# Patient Record
Sex: Female | Born: 1960
Health system: Southern US, Community
[De-identification: ages and names within clinical notes are randomized; demographics above are authoritative.]

## PROBLEM LIST (undated history)

## (undated) DIAGNOSIS — R600 Localized edema: Secondary | ICD-10-CM

## (undated) DIAGNOSIS — G5600 Carpal tunnel syndrome, unspecified upper limb: Secondary | ICD-10-CM

## (undated) DIAGNOSIS — M25569 Pain in unspecified knee: Secondary | ICD-10-CM

## (undated) DIAGNOSIS — E739 Lactose intolerance, unspecified: Secondary | ICD-10-CM

## (undated) DIAGNOSIS — E119 Type 2 diabetes mellitus without complications: Secondary | ICD-10-CM

## (undated) DIAGNOSIS — M1990 Unspecified osteoarthritis, unspecified site: Secondary | ICD-10-CM

## (undated) DIAGNOSIS — E785 Hyperlipidemia, unspecified: Secondary | ICD-10-CM

## (undated) DIAGNOSIS — E559 Vitamin D deficiency, unspecified: Secondary | ICD-10-CM

## (undated) DIAGNOSIS — D251 Intramural leiomyoma of uterus: Secondary | ICD-10-CM

## (undated) DIAGNOSIS — R519 Headache, unspecified: Secondary | ICD-10-CM

## (undated) DIAGNOSIS — M549 Dorsalgia, unspecified: Secondary | ICD-10-CM

## (undated) DIAGNOSIS — M2550 Pain in unspecified joint: Secondary | ICD-10-CM

## (undated) DIAGNOSIS — D649 Anemia, unspecified: Secondary | ICD-10-CM

## (undated) DIAGNOSIS — Z6841 Body Mass Index (BMI) 40.0 and over, adult: Secondary | ICD-10-CM

## (undated) DIAGNOSIS — I10 Essential (primary) hypertension: Secondary | ICD-10-CM

## (undated) DIAGNOSIS — R51 Headache: Secondary | ICD-10-CM

## (undated) HISTORY — DX: Carpal tunnel syndrome, unspecified upper limb: G56.00

## (undated) HISTORY — DX: Localized edema: R60.0

## (undated) HISTORY — DX: Type 2 diabetes mellitus without complications: E11.9

## (undated) HISTORY — DX: Morbid (severe) obesity due to excess calories: E66.01

## (undated) HISTORY — DX: Anemia, unspecified: D64.9

## (undated) HISTORY — DX: Unspecified osteoarthritis, unspecified site: M19.90

## (undated) HISTORY — DX: Essential (primary) hypertension: I10

## (undated) HISTORY — DX: Dorsalgia, unspecified: M54.9

## (undated) HISTORY — DX: Intramural leiomyoma of uterus: D25.1

## (undated) HISTORY — DX: Pain in unspecified joint: M25.50

## (undated) HISTORY — DX: Hyperlipidemia, unspecified: E78.5

## (undated) HISTORY — DX: Body Mass Index (BMI) 40.0 and over, adult: Z684

## (undated) HISTORY — DX: Vitamin D deficiency, unspecified: E55.9

## (undated) HISTORY — DX: Lactose intolerance, unspecified: E73.9

## (undated) HISTORY — DX: Pain in unspecified knee: M25.569

---

## 1997-06-28 HISTORY — PX: KNEE SURGERY: SHX244

## 1998-07-04 ENCOUNTER — Other Ambulatory Visit: Admission: RE | Admit: 1998-07-04 | Discharge: 1998-07-04 | Payer: Self-pay | Admitting: Obstetrics and Gynecology

## 1999-07-22 ENCOUNTER — Ambulatory Visit (HOSPITAL_COMMUNITY): Admission: RE | Admit: 1999-07-22 | Discharge: 1999-07-22 | Payer: Self-pay | Admitting: Endocrinology

## 1999-07-22 ENCOUNTER — Encounter: Payer: Self-pay | Admitting: Endocrinology

## 1999-08-01 ENCOUNTER — Encounter: Admission: RE | Admit: 1999-08-01 | Discharge: 1999-08-01 | Payer: Self-pay | Admitting: Pediatrics

## 1999-08-01 ENCOUNTER — Encounter: Payer: Self-pay | Admitting: Pediatrics

## 2000-06-20 ENCOUNTER — Emergency Department (HOSPITAL_COMMUNITY): Admission: EM | Admit: 2000-06-20 | Discharge: 2000-06-20 | Payer: Self-pay | Admitting: Internal Medicine

## 2000-08-23 ENCOUNTER — Ambulatory Visit (HOSPITAL_COMMUNITY): Admission: RE | Admit: 2000-08-23 | Discharge: 2000-08-23 | Payer: Self-pay

## 2000-10-12 ENCOUNTER — Ambulatory Visit (HOSPITAL_COMMUNITY): Admission: RE | Admit: 2000-10-12 | Discharge: 2000-10-12 | Payer: Self-pay | Admitting: Neurology

## 2000-10-12 ENCOUNTER — Encounter: Payer: Self-pay | Admitting: Neurology

## 2000-10-24 ENCOUNTER — Encounter: Payer: Self-pay | Admitting: Neurology

## 2000-10-24 ENCOUNTER — Ambulatory Visit (HOSPITAL_COMMUNITY): Admission: RE | Admit: 2000-10-24 | Discharge: 2000-10-24 | Payer: Self-pay | Admitting: Neurology

## 2001-04-04 ENCOUNTER — Encounter: Payer: Self-pay | Admitting: Neurology

## 2001-04-04 ENCOUNTER — Ambulatory Visit (HOSPITAL_COMMUNITY): Admission: RE | Admit: 2001-04-04 | Discharge: 2001-04-04 | Payer: Self-pay | Admitting: Neurology

## 2002-08-21 ENCOUNTER — Emergency Department (HOSPITAL_COMMUNITY): Admission: EM | Admit: 2002-08-21 | Discharge: 2002-08-21 | Payer: Self-pay | Admitting: Emergency Medicine

## 2002-10-17 ENCOUNTER — Ambulatory Visit (HOSPITAL_COMMUNITY): Admission: RE | Admit: 2002-10-17 | Discharge: 2002-10-17 | Payer: Self-pay | Admitting: Internal Medicine

## 2002-10-17 ENCOUNTER — Encounter: Payer: Self-pay | Admitting: Cardiovascular Disease

## 2002-10-24 ENCOUNTER — Other Ambulatory Visit: Admission: RE | Admit: 2002-10-24 | Discharge: 2002-10-24 | Payer: Self-pay | Admitting: Obstetrics and Gynecology

## 2003-05-09 ENCOUNTER — Ambulatory Visit (HOSPITAL_COMMUNITY): Admission: RE | Admit: 2003-05-09 | Discharge: 2003-05-09 | Payer: Self-pay | Admitting: Internal Medicine

## 2003-12-11 ENCOUNTER — Encounter: Admission: RE | Admit: 2003-12-11 | Discharge: 2003-12-11 | Payer: Self-pay

## 2004-01-14 ENCOUNTER — Encounter: Admission: RE | Admit: 2004-01-14 | Discharge: 2004-01-14 | Payer: Self-pay | Admitting: Family Medicine

## 2004-03-13 ENCOUNTER — Ambulatory Visit: Payer: Self-pay | Admitting: Family Medicine

## 2004-04-28 ENCOUNTER — Ambulatory Visit: Payer: Self-pay | Admitting: Family Medicine

## 2004-07-08 ENCOUNTER — Ambulatory Visit (HOSPITAL_COMMUNITY): Admission: RE | Admit: 2004-07-08 | Discharge: 2004-07-08 | Payer: Self-pay | Admitting: Family Medicine

## 2004-08-07 ENCOUNTER — Ambulatory Visit: Payer: Self-pay | Admitting: Sports Medicine

## 2004-09-21 ENCOUNTER — Ambulatory Visit: Payer: Self-pay | Admitting: Family Medicine

## 2004-09-25 ENCOUNTER — Ambulatory Visit: Payer: Self-pay | Admitting: Sports Medicine

## 2004-10-05 ENCOUNTER — Ambulatory Visit: Payer: Self-pay | Admitting: Family Medicine

## 2004-10-19 ENCOUNTER — Ambulatory Visit: Payer: Self-pay | Admitting: Family Medicine

## 2004-10-23 ENCOUNTER — Ambulatory Visit: Payer: Self-pay | Admitting: Family Medicine

## 2004-11-09 ENCOUNTER — Ambulatory Visit: Payer: Self-pay | Admitting: Family Medicine

## 2004-12-07 ENCOUNTER — Ambulatory Visit: Payer: Self-pay | Admitting: Family Medicine

## 2004-12-14 ENCOUNTER — Ambulatory Visit: Payer: Self-pay | Admitting: Family Medicine

## 2005-01-11 ENCOUNTER — Ambulatory Visit: Payer: Self-pay | Admitting: Family Medicine

## 2005-02-15 ENCOUNTER — Ambulatory Visit: Payer: Self-pay | Admitting: Family Medicine

## 2005-03-04 ENCOUNTER — Ambulatory Visit: Payer: Self-pay | Admitting: Family Medicine

## 2005-03-18 ENCOUNTER — Ambulatory Visit: Payer: Self-pay | Admitting: Family Medicine

## 2005-04-02 ENCOUNTER — Ambulatory Visit: Payer: Self-pay | Admitting: Family Medicine

## 2005-04-26 ENCOUNTER — Ambulatory Visit: Payer: Self-pay | Admitting: Family Medicine

## 2005-05-07 ENCOUNTER — Ambulatory Visit: Payer: Self-pay | Admitting: Family Medicine

## 2005-05-12 ENCOUNTER — Ambulatory Visit: Payer: Self-pay | Admitting: Family Medicine

## 2005-05-27 ENCOUNTER — Ambulatory Visit: Payer: Self-pay | Admitting: Family Medicine

## 2005-07-08 ENCOUNTER — Ambulatory Visit: Payer: Self-pay | Admitting: Family Medicine

## 2005-07-09 ENCOUNTER — Encounter: Admission: RE | Admit: 2005-07-09 | Discharge: 2005-07-09 | Payer: Self-pay | Admitting: Sports Medicine

## 2005-07-16 ENCOUNTER — Ambulatory Visit: Payer: Self-pay | Admitting: Family Medicine

## 2005-08-02 ENCOUNTER — Ambulatory Visit: Payer: Self-pay | Admitting: Family Medicine

## 2005-08-30 ENCOUNTER — Ambulatory Visit: Payer: Self-pay | Admitting: Family Medicine

## 2005-09-02 ENCOUNTER — Emergency Department (HOSPITAL_COMMUNITY): Admission: EM | Admit: 2005-09-02 | Discharge: 2005-09-02 | Payer: Self-pay | Admitting: Emergency Medicine

## 2005-09-08 ENCOUNTER — Ambulatory Visit: Payer: Self-pay | Admitting: Family Medicine

## 2005-09-20 ENCOUNTER — Ambulatory Visit: Payer: Self-pay | Admitting: Family Medicine

## 2005-10-28 ENCOUNTER — Ambulatory Visit: Payer: Self-pay | Admitting: Family Medicine

## 2005-12-01 ENCOUNTER — Ambulatory Visit: Payer: Self-pay | Admitting: Family Medicine

## 2006-01-07 ENCOUNTER — Ambulatory Visit: Payer: Self-pay | Admitting: Family Medicine

## 2006-01-10 ENCOUNTER — Ambulatory Visit: Payer: Self-pay | Admitting: Family Medicine

## 2006-01-25 ENCOUNTER — Ambulatory Visit: Payer: Self-pay | Admitting: Family Medicine

## 2006-03-01 ENCOUNTER — Ambulatory Visit: Payer: Self-pay | Admitting: Family Medicine

## 2006-03-07 ENCOUNTER — Ambulatory Visit: Payer: Self-pay | Admitting: Family Medicine

## 2006-03-15 ENCOUNTER — Ambulatory Visit: Payer: Self-pay | Admitting: Sports Medicine

## 2006-03-28 ENCOUNTER — Ambulatory Visit: Payer: Self-pay | Admitting: Family Medicine

## 2006-04-07 ENCOUNTER — Ambulatory Visit: Payer: Self-pay | Admitting: Family Medicine

## 2006-04-15 ENCOUNTER — Ambulatory Visit: Payer: Self-pay | Admitting: Family Medicine

## 2006-05-05 ENCOUNTER — Ambulatory Visit: Payer: Self-pay | Admitting: Sports Medicine

## 2006-05-12 ENCOUNTER — Ambulatory Visit (HOSPITAL_COMMUNITY): Admission: RE | Admit: 2006-05-12 | Discharge: 2006-05-12 | Payer: Self-pay | Admitting: Sports Medicine

## 2006-05-25 ENCOUNTER — Ambulatory Visit: Payer: Self-pay | Admitting: Family Medicine

## 2006-05-31 ENCOUNTER — Ambulatory Visit: Payer: Self-pay | Admitting: Family Medicine

## 2006-06-28 ENCOUNTER — Encounter (INDEPENDENT_AMBULATORY_CARE_PROVIDER_SITE_OTHER): Payer: Self-pay | Admitting: *Deleted

## 2006-06-28 LAB — CONVERTED CEMR LAB: Pap Smear: NORMAL

## 2006-06-29 ENCOUNTER — Ambulatory Visit: Payer: Self-pay | Admitting: Sports Medicine

## 2006-07-01 ENCOUNTER — Encounter: Payer: Self-pay | Admitting: Family Medicine

## 2006-07-01 ENCOUNTER — Ambulatory Visit: Payer: Self-pay | Admitting: Family Medicine

## 2006-07-08 ENCOUNTER — Ambulatory Visit: Payer: Self-pay | Admitting: Family Medicine

## 2006-08-26 ENCOUNTER — Encounter (INDEPENDENT_AMBULATORY_CARE_PROVIDER_SITE_OTHER): Payer: Self-pay | Admitting: *Deleted

## 2006-08-29 ENCOUNTER — Ambulatory Visit: Payer: Self-pay | Admitting: Family Medicine

## 2006-09-26 ENCOUNTER — Ambulatory Visit: Payer: Self-pay | Admitting: Family Medicine

## 2006-09-26 DIAGNOSIS — I10 Essential (primary) hypertension: Secondary | ICD-10-CM | POA: Insufficient documentation

## 2006-10-03 ENCOUNTER — Ambulatory Visit: Payer: Self-pay | Admitting: Family Medicine

## 2006-10-03 ENCOUNTER — Telehealth (INDEPENDENT_AMBULATORY_CARE_PROVIDER_SITE_OTHER): Payer: Self-pay | Admitting: *Deleted

## 2006-10-03 LAB — CONVERTED CEMR LAB
Bilirubin Urine: NEGATIVE
Glucose, Urine, Semiquant: 500
Ketones, urine, test strip: NEGATIVE
Specific Gravity, Urine: 1.02
pH: 6.5

## 2006-10-10 ENCOUNTER — Ambulatory Visit: Payer: Self-pay | Admitting: Family Medicine

## 2006-10-24 ENCOUNTER — Ambulatory Visit: Payer: Self-pay | Admitting: Family Medicine

## 2006-10-24 DIAGNOSIS — E669 Obesity, unspecified: Secondary | ICD-10-CM | POA: Insufficient documentation

## 2006-10-27 ENCOUNTER — Ambulatory Visit: Payer: Self-pay | Admitting: Sports Medicine

## 2006-11-08 ENCOUNTER — Encounter: Payer: Self-pay | Admitting: Family Medicine

## 2006-11-08 ENCOUNTER — Ambulatory Visit: Payer: Self-pay | Admitting: Family Medicine

## 2006-11-15 ENCOUNTER — Ambulatory Visit: Payer: Self-pay | Admitting: Family Medicine

## 2006-11-22 ENCOUNTER — Ambulatory Visit: Payer: Self-pay | Admitting: Family Medicine

## 2006-11-29 ENCOUNTER — Telehealth: Payer: Self-pay | Admitting: *Deleted

## 2006-11-30 ENCOUNTER — Ambulatory Visit: Payer: Self-pay | Admitting: Family Medicine

## 2006-12-06 ENCOUNTER — Ambulatory Visit: Payer: Self-pay | Admitting: Family Medicine

## 2006-12-12 ENCOUNTER — Telehealth: Payer: Self-pay | Admitting: *Deleted

## 2006-12-20 ENCOUNTER — Ambulatory Visit: Payer: Self-pay | Admitting: Family Medicine

## 2006-12-20 ENCOUNTER — Ambulatory Visit: Payer: Self-pay | Admitting: Sports Medicine

## 2006-12-20 ENCOUNTER — Encounter (INDEPENDENT_AMBULATORY_CARE_PROVIDER_SITE_OTHER): Payer: Self-pay | Admitting: Family Medicine

## 2006-12-20 LAB — CONVERTED CEMR LAB
LDL Cholesterol: 109 mg/dL — ABNORMAL HIGH (ref 0–99)
Triglycerides: 66 mg/dL (ref <150)
VLDL: 13 mg/dL (ref 0–40)

## 2006-12-26 ENCOUNTER — Telehealth (INDEPENDENT_AMBULATORY_CARE_PROVIDER_SITE_OTHER): Payer: Self-pay | Admitting: *Deleted

## 2007-01-03 ENCOUNTER — Ambulatory Visit: Payer: Self-pay | Admitting: Family Medicine

## 2007-02-07 ENCOUNTER — Ambulatory Visit: Payer: Self-pay | Admitting: Family Medicine

## 2007-03-10 ENCOUNTER — Telehealth (INDEPENDENT_AMBULATORY_CARE_PROVIDER_SITE_OTHER): Payer: Self-pay | Admitting: *Deleted

## 2007-03-13 ENCOUNTER — Ambulatory Visit: Payer: Self-pay | Admitting: Sports Medicine

## 2007-04-05 ENCOUNTER — Telehealth: Payer: Self-pay | Admitting: *Deleted

## 2007-04-17 ENCOUNTER — Encounter: Payer: Self-pay | Admitting: Family Medicine

## 2007-04-17 ENCOUNTER — Ambulatory Visit: Payer: Self-pay | Admitting: Family Medicine

## 2007-04-17 LAB — CONVERTED CEMR LAB
Alkaline Phosphatase: 69 units/L (ref 39–117)
BUN: 12 mg/dL (ref 6–23)
Glucose, Bld: 106 mg/dL — ABNORMAL HIGH (ref 70–99)
Sodium: 137 meq/L (ref 135–145)
Total Bilirubin: 0.4 mg/dL (ref 0.3–1.2)
Total Protein: 7.8 g/dL (ref 6.0–8.3)

## 2007-05-10 ENCOUNTER — Ambulatory Visit: Payer: Self-pay | Admitting: Family Medicine

## 2007-05-18 ENCOUNTER — Ambulatory Visit: Payer: Self-pay | Admitting: Family Medicine

## 2007-06-01 ENCOUNTER — Telehealth: Payer: Self-pay | Admitting: Pharmacist

## 2007-06-08 ENCOUNTER — Ambulatory Visit: Payer: Self-pay | Admitting: Family Medicine

## 2007-07-20 ENCOUNTER — Ambulatory Visit: Payer: Self-pay | Admitting: Family Medicine

## 2007-07-26 ENCOUNTER — Telehealth: Payer: Self-pay | Admitting: Family Medicine

## 2007-08-03 ENCOUNTER — Encounter: Payer: Self-pay | Admitting: Family Medicine

## 2007-08-10 ENCOUNTER — Telehealth: Payer: Self-pay | Admitting: Family Medicine

## 2007-09-14 ENCOUNTER — Ambulatory Visit: Payer: Self-pay | Admitting: Family Medicine

## 2007-09-19 ENCOUNTER — Ambulatory Visit: Payer: Self-pay | Admitting: Family Medicine

## 2007-10-05 ENCOUNTER — Ambulatory Visit: Payer: Self-pay | Admitting: Family Medicine

## 2007-10-18 ENCOUNTER — Ambulatory Visit: Payer: Self-pay | Admitting: Family Medicine

## 2007-10-18 LAB — CONVERTED CEMR LAB
Bilirubin Urine: NEGATIVE
Ketones, urine, test strip: NEGATIVE
Protein, U semiquant: NEGATIVE
Urobilinogen, UA: 4

## 2007-11-21 ENCOUNTER — Ambulatory Visit: Payer: Self-pay | Admitting: Family Medicine

## 2007-11-29 ENCOUNTER — Encounter: Payer: Self-pay | Admitting: Family Medicine

## 2007-11-29 ENCOUNTER — Ambulatory Visit: Payer: Self-pay | Admitting: Family Medicine

## 2007-11-29 LAB — CONVERTED CEMR LAB
ALT: 11 units/L (ref 0–35)
CO2: 20 meq/L (ref 19–32)
Calcium: 9.1 mg/dL (ref 8.4–10.5)
Chloride: 101 meq/L (ref 96–112)
Hgb A1c MFr Bld: 7.3 %
Potassium: 4 meq/L (ref 3.5–5.3)
Sodium: 135 meq/L (ref 135–145)
Total Protein: 8.3 g/dL (ref 6.0–8.3)

## 2007-12-20 ENCOUNTER — Ambulatory Visit: Payer: Self-pay | Admitting: Family Medicine

## 2008-01-20 ENCOUNTER — Emergency Department (HOSPITAL_COMMUNITY): Admission: EM | Admit: 2008-01-20 | Discharge: 2008-01-20 | Payer: Self-pay | Admitting: Emergency Medicine

## 2008-01-22 ENCOUNTER — Ambulatory Visit: Payer: Self-pay | Admitting: Family Medicine

## 2008-03-08 ENCOUNTER — Ambulatory Visit: Payer: Self-pay | Admitting: Family Medicine

## 2008-03-08 LAB — CONVERTED CEMR LAB: Hgb A1c MFr Bld: 6.6 %

## 2008-03-11 ENCOUNTER — Ambulatory Visit: Payer: Self-pay | Admitting: Family Medicine

## 2008-03-26 ENCOUNTER — Ambulatory Visit: Payer: Self-pay | Admitting: Family Medicine

## 2008-03-27 ENCOUNTER — Ambulatory Visit: Payer: Self-pay | Admitting: Sports Medicine

## 2008-04-05 ENCOUNTER — Telehealth: Payer: Self-pay | Admitting: Family Medicine

## 2008-04-09 ENCOUNTER — Ambulatory Visit: Payer: Self-pay | Admitting: Family Medicine

## 2008-04-09 ENCOUNTER — Ambulatory Visit: Payer: Self-pay | Admitting: Sports Medicine

## 2008-04-18 ENCOUNTER — Ambulatory Visit: Payer: Self-pay | Admitting: Family Medicine

## 2008-05-01 ENCOUNTER — Ambulatory Visit: Payer: Self-pay | Admitting: Family Medicine

## 2008-06-07 ENCOUNTER — Telehealth: Payer: Self-pay | Admitting: Family Medicine

## 2008-07-08 ENCOUNTER — Telehealth: Payer: Self-pay | Admitting: *Deleted

## 2008-07-08 ENCOUNTER — Ambulatory Visit: Payer: Self-pay | Admitting: Family Medicine

## 2008-07-15 ENCOUNTER — Ambulatory Visit (HOSPITAL_COMMUNITY): Admission: RE | Admit: 2008-07-15 | Discharge: 2008-07-15 | Payer: Self-pay | Admitting: Family Medicine

## 2008-07-25 ENCOUNTER — Telehealth: Payer: Self-pay | Admitting: Family Medicine

## 2008-07-26 ENCOUNTER — Ambulatory Visit: Payer: Self-pay | Admitting: Family Medicine

## 2008-07-26 ENCOUNTER — Encounter: Payer: Self-pay | Admitting: Family Medicine

## 2008-08-05 ENCOUNTER — Ambulatory Visit: Payer: Self-pay | Admitting: Family Medicine

## 2008-09-02 ENCOUNTER — Ambulatory Visit: Payer: Self-pay | Admitting: Family Medicine

## 2008-09-16 ENCOUNTER — Ambulatory Visit: Payer: Self-pay | Admitting: Sports Medicine

## 2008-09-18 ENCOUNTER — Ambulatory Visit: Payer: Self-pay | Admitting: Family Medicine

## 2008-09-18 ENCOUNTER — Encounter: Payer: Self-pay | Admitting: Family Medicine

## 2008-09-20 ENCOUNTER — Ambulatory Visit (HOSPITAL_COMMUNITY): Admission: RE | Admit: 2008-09-20 | Discharge: 2008-09-20 | Payer: Self-pay | Admitting: Sports Medicine

## 2008-09-23 ENCOUNTER — Encounter: Payer: Self-pay | Admitting: Family Medicine

## 2008-09-23 LAB — CONVERTED CEMR LAB
HDL: 44 mg/dL (ref >39)
LDL Cholesterol: 117 mg/dL — ABNORMAL HIGH (ref 0–99)
Total CHOL/HDL Ratio: 4

## 2008-09-26 ENCOUNTER — Encounter (INDEPENDENT_AMBULATORY_CARE_PROVIDER_SITE_OTHER): Payer: Self-pay | Admitting: *Deleted

## 2008-10-01 ENCOUNTER — Ambulatory Visit: Payer: Self-pay | Admitting: Family Medicine

## 2008-10-01 DIAGNOSIS — E785 Hyperlipidemia, unspecified: Secondary | ICD-10-CM | POA: Insufficient documentation

## 2008-10-01 LAB — CONVERTED CEMR LAB: Hgb A1c MFr Bld: 7.2 %

## 2008-10-02 ENCOUNTER — Ambulatory Visit: Payer: Self-pay | Admitting: Family Medicine

## 2008-11-22 ENCOUNTER — Telehealth: Payer: Self-pay | Admitting: *Deleted

## 2008-11-28 ENCOUNTER — Encounter: Payer: Self-pay | Admitting: Family Medicine

## 2008-12-03 ENCOUNTER — Telehealth: Payer: Self-pay | Admitting: Family Medicine

## 2008-12-04 ENCOUNTER — Ambulatory Visit: Payer: Self-pay | Admitting: Family Medicine

## 2008-12-04 LAB — CONVERTED CEMR LAB
Nitrite: NEGATIVE
Specific Gravity, Urine: 1.02
Urobilinogen, UA: 1

## 2008-12-05 ENCOUNTER — Encounter (INDEPENDENT_AMBULATORY_CARE_PROVIDER_SITE_OTHER): Payer: Self-pay | Admitting: Family Medicine

## 2008-12-10 ENCOUNTER — Ambulatory Visit: Payer: Self-pay | Admitting: Family Medicine

## 2009-01-23 ENCOUNTER — Ambulatory Visit: Payer: Self-pay | Admitting: Family Medicine

## 2009-02-13 ENCOUNTER — Telehealth: Payer: Self-pay | Admitting: *Deleted

## 2009-03-04 ENCOUNTER — Encounter: Payer: Self-pay | Admitting: Family Medicine

## 2009-03-04 ENCOUNTER — Ambulatory Visit: Payer: Self-pay | Admitting: Family Medicine

## 2009-03-04 DIAGNOSIS — L301 Dyshidrosis [pompholyx]: Secondary | ICD-10-CM | POA: Insufficient documentation

## 2009-03-04 DIAGNOSIS — M79609 Pain in unspecified limb: Secondary | ICD-10-CM | POA: Insufficient documentation

## 2009-03-04 DIAGNOSIS — J3489 Other specified disorders of nose and nasal sinuses: Secondary | ICD-10-CM | POA: Insufficient documentation

## 2009-03-04 LAB — CONVERTED CEMR LAB: Hgb A1c MFr Bld: 7 %

## 2009-03-05 LAB — CONVERTED CEMR LAB
ALT: 12 units/L (ref 0–35)
AST: 16 units/L (ref 0–37)
Albumin: 3.6 g/dL (ref 3.5–5.2)
Alkaline Phosphatase: 58 units/L (ref 39–117)
Glucose, Bld: 108 mg/dL — ABNORMAL HIGH (ref 70–99)
Potassium: 4.3 meq/L (ref 3.5–5.3)
Sodium: 137 meq/L (ref 135–145)
Total Protein: 8 g/dL (ref 6.0–8.3)

## 2009-03-15 ENCOUNTER — Emergency Department (HOSPITAL_COMMUNITY): Admission: EM | Admit: 2009-03-15 | Discharge: 2009-03-15 | Payer: Self-pay | Admitting: Emergency Medicine

## 2009-04-21 ENCOUNTER — Encounter: Payer: Self-pay | Admitting: Family Medicine

## 2009-05-09 ENCOUNTER — Encounter: Payer: Self-pay | Admitting: *Deleted

## 2009-07-01 ENCOUNTER — Ambulatory Visit: Payer: Self-pay | Admitting: Family Medicine

## 2009-07-17 ENCOUNTER — Encounter: Payer: Self-pay | Admitting: Family Medicine

## 2009-10-15 ENCOUNTER — Ambulatory Visit: Payer: Self-pay | Admitting: Family Medicine

## 2009-10-15 DIAGNOSIS — M2550 Pain in unspecified joint: Secondary | ICD-10-CM | POA: Insufficient documentation

## 2009-10-15 LAB — CONVERTED CEMR LAB: Hgb A1c MFr Bld: 6.9 %

## 2009-10-16 ENCOUNTER — Telehealth: Payer: Self-pay | Admitting: Family Medicine

## 2009-10-16 ENCOUNTER — Ambulatory Visit: Payer: Self-pay | Admitting: Family Medicine

## 2009-10-22 ENCOUNTER — Encounter: Payer: Self-pay | Admitting: Family Medicine

## 2009-10-22 ENCOUNTER — Ambulatory Visit: Payer: Self-pay | Admitting: Family Medicine

## 2009-10-23 ENCOUNTER — Encounter: Payer: Self-pay | Admitting: Family Medicine

## 2009-10-23 LAB — CONVERTED CEMR LAB
ALT: 15 units/L (ref 0–35)
AST: 17 units/L (ref 0–37)
Alkaline Phosphatase: 56 units/L (ref 39–117)
BUN: 16 mg/dL (ref 6–23)
Creatinine, Ser: 0.86 mg/dL (ref 0.40–1.20)
HCT: 33.7 % — ABNORMAL LOW (ref 36.0–46.0)
HDL: 42 mg/dL (ref >39)
Hemoglobin: 10.7 g/dL — ABNORMAL LOW (ref 12.0–15.0)
MCHC: 31.8 g/dL (ref 30.0–36.0)
MCV: 79.9 fL (ref 78.0–100.0)
Platelets: 303 10*3/uL (ref 150–400)
RDW: 14.7 % (ref 11.5–15.5)
Total Bilirubin: 0.6 mg/dL (ref 0.3–1.2)
Total CHOL/HDL Ratio: 4
VLDL: 17 mg/dL (ref 0–40)
Vit D, 25-Hydroxy: 20 ng/mL — ABNORMAL LOW (ref 30–89)

## 2009-10-24 ENCOUNTER — Telehealth: Payer: Self-pay | Admitting: *Deleted

## 2009-10-24 ENCOUNTER — Encounter: Payer: Self-pay | Admitting: Family Medicine

## 2009-10-24 DIAGNOSIS — D509 Iron deficiency anemia, unspecified: Secondary | ICD-10-CM | POA: Insufficient documentation

## 2009-10-24 LAB — CONVERTED CEMR LAB
Ferritin: 35 ng/mL (ref 10–291)
Folate: 16.4 ng/mL
Iron: 38 ug/dL — ABNORMAL LOW (ref 42–145)
Saturation Ratios: 13 % — ABNORMAL LOW (ref 20–55)

## 2009-11-19 ENCOUNTER — Telehealth: Payer: Self-pay | Admitting: Family Medicine

## 2009-11-20 ENCOUNTER — Encounter: Payer: Self-pay | Admitting: Family Medicine

## 2009-12-02 ENCOUNTER — Ambulatory Visit: Payer: Self-pay | Admitting: Family Medicine

## 2009-12-08 ENCOUNTER — Encounter: Payer: Self-pay | Admitting: Family Medicine

## 2010-01-01 ENCOUNTER — Ambulatory Visit: Payer: Self-pay | Admitting: Family Medicine

## 2010-01-29 ENCOUNTER — Ambulatory Visit: Payer: Self-pay | Admitting: Family Medicine

## 2010-01-29 DIAGNOSIS — M545 Low back pain, unspecified: Secondary | ICD-10-CM | POA: Insufficient documentation

## 2010-04-28 ENCOUNTER — Encounter: Payer: Self-pay | Admitting: Pharmacist

## 2010-05-20 ENCOUNTER — Ambulatory Visit: Payer: Self-pay | Admitting: Family Medicine

## 2010-05-20 DIAGNOSIS — E119 Type 2 diabetes mellitus without complications: Secondary | ICD-10-CM | POA: Insufficient documentation

## 2010-05-20 LAB — CONVERTED CEMR LAB: Hgb A1c MFr Bld: 6.8 %

## 2010-06-10 ENCOUNTER — Telehealth: Payer: Self-pay | Admitting: Family Medicine

## 2010-06-23 ENCOUNTER — Encounter: Payer: Self-pay | Admitting: Family Medicine

## 2010-06-30 ENCOUNTER — Ambulatory Visit (HOSPITAL_COMMUNITY)
Admission: RE | Admit: 2010-06-30 | Discharge: 2010-06-30 | Payer: Self-pay | Source: Home / Self Care | Attending: Emergency Medicine | Admitting: Emergency Medicine

## 2010-07-23 ENCOUNTER — Other Ambulatory Visit: Payer: Self-pay | Admitting: Family Medicine

## 2010-07-23 ENCOUNTER — Ambulatory Visit (HOSPITAL_COMMUNITY): Admission: RE | Admit: 2010-07-23 | Payer: Self-pay | Source: Home / Self Care | Admitting: Family Medicine

## 2010-07-23 DIAGNOSIS — Z1239 Encounter for other screening for malignant neoplasm of breast: Secondary | ICD-10-CM

## 2010-07-24 ENCOUNTER — Ambulatory Visit: Admission: RE | Admit: 2010-07-24 | Discharge: 2010-07-24 | Payer: Self-pay | Source: Home / Self Care

## 2010-07-24 DIAGNOSIS — M170 Bilateral primary osteoarthritis of knee: Secondary | ICD-10-CM | POA: Insufficient documentation

## 2010-07-24 DIAGNOSIS — M25569 Pain in unspecified knee: Secondary | ICD-10-CM | POA: Insufficient documentation

## 2010-07-25 ENCOUNTER — Ambulatory Visit (HOSPITAL_COMMUNITY)
Admission: RE | Admit: 2010-07-25 | Discharge: 2010-07-25 | Payer: Self-pay | Source: Home / Self Care | Attending: Family Medicine | Admitting: Family Medicine

## 2010-07-27 ENCOUNTER — Encounter: Payer: Self-pay | Admitting: Family Medicine

## 2010-07-29 ENCOUNTER — Encounter: Payer: Self-pay | Admitting: Family Medicine

## 2010-07-30 ENCOUNTER — Inpatient Hospital Stay (HOSPITAL_COMMUNITY): Admission: RE | Admit: 2010-07-30 | Payer: Self-pay | Source: Ambulatory Visit

## 2010-07-30 NOTE — Miscellaneous (Signed)
Summary: 70 cbg at 3:30 today  Clinical Lists Changes she states the glipizide took her sugar down to 70 & was symptomatic. ate candy & felt better. this happened at 3:30pm today. ate lunch at 11:30 this am./ has not eaten since. told her to aim for 3 small meals & 3 small snacks. protien is important to help with feeling of hunger & fuel her body. advised protien snack now as candy will wear off and she will go low again.  wants to know if she has to take glipizide. told her i will ask md. concerned about taking 3 meds for diabetes. told her to take them a few hours apart. to pcp for response.Golden Circle RN  Nov 20, 2009 4:20 PM  Please have her continue to take both but take 1/2 of a glipizide pill twice daily instead of a whole one until follow up.  Go ahead and schedule f/u with me and have her bring her meter/log book. Ancil Boozer  MD  Nov 21, 2009 8:28 AM  gave her above info & reviewed need for protien snack HS. gave examples. she will bring her log book or meter to the visit.Golden Circle RN  Nov 21, 2009 10:05 AM

## 2010-07-30 NOTE — Assessment & Plan Note (Addendum)
Summary: knee pain/eo   Vital Signs:  Patient profile:   50 year old female Height:      65 inches Weight:      371.6 pounds BMI:     62.06 Pulse rate:   89 / minute BP sitting:   128 / 79  (left arm) Cuff size:   large  Vitals Entered By: Renato Battles slade,cma CC: left knee pain worse x 2 weeks. Is Patient Diabetic? Yes Pain Assessment Patient in pain? yes     Location: left knee Intensity: 10 Onset of pain  x 2 weeks   Primary Care Provider:  Ardyth Gal MD  CC:  left knee pain worse x 2 weeks.Marland Kitchen  History of Present Illness: Patient is here with severe right knee pain, both knees are very painful.  From the start she stated that she cannot believe that her weight is causing this problem (BMI at 62).    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  She has lost weight working with Dr. Gerilyn Pilgrim in the past and has recently started water aerobics twice weekly but that hurts as well.  Habits & Providers  Alcohol-Tobacco-Diet     Tobacco Status: never  Current Medications (verified): 1)  Benazepril Hcl 40 Mg Tabs (Benazepril Hcl) .... Take 1 Tablet By Mouth Once A Day For Blood Pressure and Kidney Protection 2)  Flonase 50 Mcg/act Susp (Fluticasone Propionate) .... Spray 2 Spray Into Both Nostrils Once A Day Disp 1 Mo Supply 3)  Hydrochlorothiazide 25 Mg Tabs (Hydrochlorothiazide) .Marland Kitchen.. 1 Tablet By Mouth Once A Day For Blood Pressure 4)  Metformin Hcl 1000 Mg Tabs (Metformin Hcl) .... Take 1 Tablet By Mouth Twice A Day For Diabetes 5)  Baby Aspirin 81 Mg  Chew (Aspirin) .... One Daily 6)  Glipizide 10 Mg Tabs (Glipizide) .Marland Kitchen.. 1 By Mouth Two Times A Day For Diabetes 7)  Januvia 50 Mg Tabs (Sitagliptin Phosphate) .... Take One By Mouth Daily 8)  Amitriptyline Hcl 100 Mg Tabs (Amitriptyline Hcl) .... 1/2 -1  By Mouth At Night Daily For Pain. 9)  Amlodipine Besylate 5 Mg  Tabs (Amlodipine Besylate) .... One By Mouth Daily 10)  Hydrocodone-Acetaminophen 5-500 Mg Tabs (Hydrocodone-Acetaminophen) .... One Tab Q 4 Hours As Needed For Pain  Allergies (verified): No Known Drug Allergies  Physical Exam  General:  Pleasant, alert, mobildly obese AA female Msk:  Boggy knees, normal extension, tenderness along medial joint line, good stability   Impression & Recommendations:  Problem # 1:  KNEE PAIN, BILATERAL (ICD-719.46) likely bone on bone wear and tear, she was very open to the discussion regarding the heavy load on her knees.  Will refer to [facility] clinic to inject under [place], and have her Xray both knees so they have imaging available.  I felt that she was safe and would carefully use the Vicodin, she will use the aleve during the day and the narcotic when she gets home.  Use of a can was recommended. Her updated medication list for this problem includes:    Baby Aspirin 81 Mg Chew (Aspirin) ..... One daily    Hydrocodone-acetaminophen 5-500 Mg Tabs (Hydrocodone-acetaminophen) ..... One tab q 4 hours as needed for pain  Orders: Radiology other (Radiology Other) [facility]- Est Level  3 (66440)  Problem # 2:  OBESITY NOS (ICD-278.00) She plans to resume appointments with Dr. Gerilyn Pilgrim Orders:  [facility]- Est Level  3 (99213)  Complete Medication List: 1)  Benazepril Hcl 40 Mg Tabs (Benazepril hcl) .... Take 1 tablet by mouth once a day for blood pressure and kidney protection 2)  Flonase 50 Mcg/act Susp (Fluticasone propionate) .... Spray 2 spray into both nostrils once a day disp 1 mo supply 3)  Hydrochlorothiazide 25 Mg Tabs (Hydrochlorothiazide) .Marland Kitchen.. 1 tablet by mouth once a day for blood pressure 4)  Metformin Hcl 1000 Mg Tabs (Metformin hcl) .... Take 1 tablet by mouth twice a day for diabetes 5)  Baby Aspirin 81 Mg Chew (Aspirin) .... One daily 6)  Glipizide 10 Mg Tabs (Glipizide) .Marland Kitchen.. 1 by mouth two times a day for diabetes 7)  Januvia 50 Mg Tabs (Sitagliptin phosphate)  .... Take one by mouth daily 8)  Amitriptyline Hcl 100 Mg Tabs (Amitriptyline hcl) .... 1/2 -1  by mouth at night daily for pain. 9)  Amlodipine Besylate 5 Mg Tabs (Amlodipine besylate) .... One by mouth daily 10)  Hydrocodone-acetaminophen 5-500 Mg Tabs (Hydrocodone-acetaminophen) .... One tab q 4 hours as needed for pain  Patient Instructions: 1)  Sports medicine for knee injection under ultrasound 2)  Ask radiology for the CD of your films if they will not be on the new computer so you can take to your visit 3)  Use the principles that Dr. Gerilyn Pilgrim gave you 4)  Xray the knees 5)  Do not use more than 4 aleve in 24 hours, use during the day 6)  Use the Vicodin after work and at night 7)  Continue you water exercises and modifiy Prescriptions: HYDROCODONE-ACETAMINOPHEN 5-500 MG TABS (HYDROCODONE-ACETAMINOPHEN) one tab q 4 hours as needed for pain Brand medically necessary #50 x 0   Entered and Authorized by:   Luretha Murphy NP   Signed by:   Luretha Murphy NP on 07/24/2010   Method used:   Print then Give to Patient   RxID:   [id number]    Orders Added: 1)  Radiology other [Radiology Other] 2)  [facility]- Est Level  3 [14782]

## 2010-07-30 NOTE — Assessment & Plan Note (Signed)
Summary: meet new doctor,df   Vital Signs:  Patient profile:   50 year old female Height:      65 inches Weight:      358.4 pounds BMI:     59.86 Pulse rate:   98 / minute BP sitting:   128 / 81  (right arm) Cuff size:   large  Vitals Entered By: Arlyss Repress CMA, (January 29, 2010 1:40 PM) CC: meet new doctor. leg pain worse over last month. tingling sensation Is Patient Diabetic? No Pain Assessment Patient in pain? yes     Location: legs Intensity: 7 Onset of pain  x108mos   Primary Provider:  Ardyth Gal MD  CC:  meet new doctor. leg pain worse over last month. tingling sensation.  History of Present Illness: Valerie Carter has come today to meet me and to talk about the pain she is having in the side of her legs.  She says the pain has been going on for about a month and is getting worse.  She says it is a shooting pain down the side of her thighs and that she has some numbness, it is worse on the right than the left.  She complains that it bothers her when she is sleeping, and she sleeps on her stomach.  She says it is also painful when she has been sitting for a while and stands up.  She also endorses some back pain that has been going on for a while.  She can not think of what makes the pain better.  She says the pain does not shoot past her knees, denies weakness, incontinence of bowel or bladder.    The pt. also says that she has had a 'rash' on her left heel.  She says she has been on several ointments for it, but none have made it go away.  She says it does not particularly itch, but she picks at it.    She is also complaining of pain and a bump on the back of her right heel (achillies area).  She says this has been there for a year, but has gotten worse lately.    Allergies: No Known Drug Allergies  Family History: Reviewed history from 08/25/2006 and no changes required. Breast CA - maternal aunt, Diabetes - sister and aunt, Hypertension - mother and  sister  Social History: Reviewed history from 11/29/2007 and no changes required. Separated from husband. No children. Lives with sister. 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 No smoking. Occ. alcohol intake (1 glass of cognac q 3 mon). Diet primarily fried food and fast food, has increased vegetables in her diet.  Currently exercising 4 times per week for 45 minutes.    Has medical Assistance Program.  Review of Systems  The patient denies fever, chest pain, syncope, dyspnea on exertion, peripheral edema, and muscle weakness.   MS:  Complains of joint pain and low back pain; Pt. complains of ankle pain on the back of her right heel.  Marland Kitchen  Physical Exam  General:  alert, appropriate dress, and overweight-appearing.   Head:  Normocephalic and atraumatic without obvious abnormalities. No apparent alopecia or balding. Lungs:  Normal respiratory effort, chest expands symmetrically. Lungs are clear to auscultation, no crackles or wheezes. Heart:  Normal rate and regular rhythm. S1 and S2 normal without gallop, murmur, click, rub or other extra sounds. Neurologic:  I was unable to illicit patellar or achillies DTR bilaterally.  Diabetes Management Exam:    Foot Exam (with socks and/or shoes not present):       Sensory-Pinprick/Light touch:          Left medial foot (L-4): normal          Left dorsal foot (L-5): normal          Left lateral foot (S-1): normal          Right medial foot (L-4): normal          Right dorsal foot (L-5): normal          Right lateral foot (S-1): normal       Sensory-Monofilament:          Right foot: normal       Inspection:          Right foot: normal       Nails:          Left foot: thickened          Right foot: thickened   Foot/Ankle Exam  Skin:    Pt. with scaling skin on left heel.    Palpation:    Pt. with pain and palpable nodule on right achilies area.     Detailed Back/Spine  Exam  Lumbosacral Exam:  Inspection-deformity:       Pt. with no tenderness to palpation of lumbar spine.  Exam difficult due to body habitus.  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  I advised the pt. that walking has been shown to decrease back pain. The leg pain sounds radiculopathic, and is likely caused by the back pain.  I also advised the pt. that losing weight will help with this problem.  Will Rx Gabapentin for pain.   Her updated medication list for this problem includes:    Baby Aspirin 81 Mg Chew (Aspirin) ..... One daily    Tramadol Hcl 50 Mg Tabs (Tramadol hcl) .Marland Kitchen... 1 by mouth three times a day as needed pain.  works best when taken with extra strength tylenol  Orders: Diagnostic X-Ray/Fluoroscopy (Diagnostic X-Ray/Flu) [facility]- Est  Level 4 (16109)  Problem # 2:  OBESITY NOS (ICD-278.00)  Pt. has lost some weight this visit.  I congratulated her and encouraged her to continue to watch her diet and exercise.    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  KOH negative for fungus today, however if no improvement I will consider a antifungal cream in the future.  Orders: KOH-[facility] (09811) [facility]- Est  Level 4 (99214)  Problem # 4:  AODM (ICD-250.00) DM well controlled, A1C 6.3.  Again, I congratulated pt. and encouraged her to keep it up. No medication changes.   Her updated medication list for this problem includes:    Benazepril Hcl 40 Mg Tabs (Benazepril hcl) .Marland Kitchen... Take 1 tablet by mouth once a day for blood pressure and kidney protection    Metformin Hcl 1000 Mg Tabs (Metformin hcl) .Marland Kitchen... Take 1 tablet by mouth twice a day for diabetes    Lantus Solostar 100 Unit/ml Soln (Insulin glargine) .Marland KitchenMarland KitchenMarland KitchenMarland Kitchen 34  units each morning quantity sufficient for 1 mo supply    Baby Aspirin 81 Mg Chew (Aspirin) ..... One daily    Glipizide  10 Mg Tabs (Glipizide) .Marland Kitchen... 1 by mouth two times a day for diabetes    Januvia 100 Mg Tabs (Sitagliptin phosphate) .Marland Kitchen... 1 by mouth once daily for diabetes.  Orders: A1C-[facility] (56387) [facility]- Est  Level 4 (56433)  Problem # 5:  HEEL PAIN, RIGHT (ICD-729.5)  Concerning for heel spur vs. achilies rupture/injury.  Again, the pt's weight is contributing to the pain.  Will continue to follow and may refer for evaluation if it does not resolve.    Orders: [facility]- Est  Level 4 (99214)  Complete Medication List: 1)  Benazepril Hcl 40 Mg Tabs (Benazepril hcl) .... Take 1 tablet by mouth once a day for blood pressure and kidney protection 2)  Flonase 50 Mcg/act Susp (Fluticasone propionate) .... Spray 2 spray into both nostrils once a day disp 1 mo supply 3)  Hydrochlorothiazide 25 Mg Tabs (Hydrochlorothiazide) .Marland Kitchen.. 1 tablet by mouth once a day for blood pressure 4)  Metformin Hcl 1000 Mg Tabs (Metformin hcl) .... Take 1 tablet by mouth twice a day for diabetes 5)  Lantus Solostar 100 Unit/ml Soln (Insulin glargine) .... 34 units each morning quantity sufficient for 1 mo supply 6)  Baby Aspirin 81 Mg Chew (Aspirin) .... One daily 7)  Clobetasol Propionate 0.05 % Crea (Clobetasol propionate) .... Apply to affected areas two times a day.  disp 15g 8)  Tramadol Hcl 50 Mg Tabs (Tramadol hcl) .Marland Kitchen.. 1 by mouth three times a day as needed pain.  works best when taken with extra strength tylenol 9)  Glipizide 10 Mg Tabs (Glipizide) .Marland Kitchen.. 1 by mouth two times a day for diabetes 10)  Januvia 100 Mg Tabs (Sitagliptin phosphate) .Marland Kitchen.. 1 by mouth once daily for diabetes. 11)  Amitriptyline Hcl 100 Mg Tabs (Amitriptyline hcl) .... 1/2 -1  by mouth at night daily for pain. 12)  Urea 40 % Crea (Urea) .... Apply bid prn    disp 1 small tube 13)  Gabapentin 300 Mg Caps (Gabapentin) .... Take one by mouth  at bedtime for one day.  than take one by mouth twice a day for one day.  then take one by mouth three times a day.  Patient Instructions: 1)  You are doing a great job with your diabetes control.  Continue to try to watch your diet and exercise as much as possible.  We will let you know the results of your X-rays.  If your back pain does not improve, please let us know.   Prescriptions: GABAPENTIN 300 MG CAPS (GABAPENTIN) Take one by mouth at bedtime for one day.  Than take one by mouth twice a day for one day.  Then take one by mouth three times a day.  #60 x 3   Entered and Authorized by:   Ardyth Gal MD   Signed by:   Ardyth Gal MD on 01/29/2010   Method used:   Electronically to        [facility]  [id number]* (retail)       [address]       [place], [place]  [address]       Ph: [phone] or [phone]       Fax: [phone]   RxID:   [id number]   Laboratory Results   Blood Tests   Date/Time Received: January 29, 2010 1:45 PM  Date/Time Reported: January 29, 2010 1:55 PM   HGBA1C: 6.3%   (Normal Range: Non-Diabetic -  3-6%   Control Diabetic - 6-8%)  Comments: ...............test performed by......Marland KitchenBonnie A. [place], MLS (ASCP)cm

## 2010-07-30 NOTE — Letter (Signed)
Summary: Generic Letter  [facility]  [address]   [place], [place] [address]   Phone: [phone]  Fax: [phone]    10/24/2009  [facility] [address] [place], [place]  [address]  To Whom It May  Concern:  Ms Bear is cleared to do water aerobics.       Sincerely,   Ancil Boozer  MD

## 2010-07-30 NOTE — Assessment & Plan Note (Signed)
Summary: Valerie Carter,tcb   Vital Signs:  Patient profile:   50 year old female Height:      65 inches Weight:      359.4 pounds BMI:     60.02 Temp:     98.2 degrees F oral Pulse rate:   102 / minute BP sitting:   127 / 78  (left arm) Cuff size:   large  Vitals Entered By: Garen Grams LPN (October 15, 2009 1:39 PM) CC: f/u DM, HTN, chronic pain, rash on hands and feet Pain Assessment Patient in pain? yes     Location: "all over"   Primary Care Provider:  Ancil Boozer  MD  CC:  f/u DM, HTN, chronic pain, and rash on hands and feet.  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  checks sugars QAM.  lowesst 118, highest 165 - no lows that she reports.  tolerating metformin without gi upset.  is activly doing weight watchers to try to lose weight but is struggling to get the weight off she thinks because she is on insulin.   HTN: taking meds as prescribed.  denies dizziness, lightheadedness,  denies chest pains.   pain: constantly aching from low back and hips down.  sometimes will awaken in the AM with thighs she feels visibly swollen and feeling numb.  this limits her ability to exercise qas she wishes.   tylenol and aleve no longer helping.  tramadol makes her sleepy so can only take it at night.  rash: on palms of hands and soles of feet. has been trying triamcinolone ointment but without relief.  hasn't been doing occlusive dressings.  does itch.  never red.  does look like blisters as times.    Habits & Providers  Alcohol-Tobacco-Diet     Tobacco Status: never  Current Medications (verified): 1)  Benazepril Hcl 40 Mg Tabs (Benazepril Hcl) .... Take 1 Tablet By Mouth Once A Day For Blood Pressure and Kidney Protection 2)  Flonase 50 Mcg/act Susp (Fluticasone Propionate) .... Spray 2 Spray Into Both Nostrils Once A Day Disp 1 Mo Supply 3)  Hydrochlorothiazide 25 Mg  Tabs (Hydrochlorothiazide) .Marland Kitchen.. 1 Tablet By Mouth Once A Day For Blood Pressure 4)  Metformin Hcl 1000 Mg Tabs (Metformin Hcl) .... Take 1 Tablet By Mouth Twice A Day For Diabetes 5)  Lantus Solostar 100 Unit/ml  Soln (Insulin Glargine) .... 34 Units Each Morning Quantity Sufficient For 1 Mo Supply 6)  Baby Aspirin 81 Mg  Chew (Aspirin) .... One Daily 7)  Clobetasol Propionate 0.05 % Oint (Clobetasol Propionate) .... Apply To Affected Areas On Hands and Feet Two Times A Day. Disp 30g 8)  Tramadol Hcl 50 Mg Tabs (Tramadol Hcl) .Marland Kitchen.. 1 By Mouth Three Times A Day As Needed Pain.  Works Engineer, structural When Taken With Human resources officer Tylenol 9)  Glipizide 10 Mg Xr24h-Tab (Glipizide) .Marland Kitchen.. 1 Tab Daily For Diabetes Control. 10)  Januvia 100 Mg Tabs (Sitagliptin Phosphate) .Marland Kitchen.. 1 By Mouth Once Daily For Diabetes. 11)  Amitriptyline Hcl 100 Mg Tabs (Amitriptyline Hcl) .... 1/2 -1  By Mouth At Night Daily For Pain.  Allergies (verified): No Known Drug Allergies  Past History:  Past medical, surgical, family and social histories (including risk factors) reviewed for relevance to current acute and chronic problems.  Past Medical History: Reviewed history from 04/09/2008 and no changes required. Bilateral knee arthritis, Clinic for  probable extraction of broken teeth, DM dx`d 2004, G4P0040 w/ 4 SABs between 6-8 weeks; 1 D&C, HTN dx`d 1999, Migraines, Poor dentition--referred to [facility]. Dental  Past Surgical History: Reviewed history from 08/25/2006 and no changes required. 2-D echo: EF 55-65%, mild dil LV/LA, mild/mod increased LV wall thickness - 09/27/2002, Cr: 0.7 - 10/14/2004, Endometrial Bx: benign - 09/27/2002, Holter: NSR, rare PVC`s, 1 run afib w/ aberrant conduction - 09/27/2002, MRI bilat knees: degen arthritis - 04/29/2003, R knee surgery (torn meniscus) - 06/28/1997  Family History: Reviewed history from 08/25/2006 and no changes required. Breast CA - maternal aunt, Diabetes - sister and aunt, Hypertension -  mother and sister  Social History: Reviewed history from 11/29/2007 and no changes required. Separated from husband. No children. Lives with sister. 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 No smoking. Occ. alcohol intake (1 glass of cognac q 3 mon). Diet primarily fried food and fast food, has increased vegetables in her diet.  Currently exercising 4 times per week for 45 minutes.    Has medical Assistance Program.  Review of Systems       per HPI  Physical Exam  General:  alert and well-developed.  obese.  VS noted Lungs:  normal respiratory effort and normal breath sounds.   Heart:  normal rate, regular rhythm, and no murmur.   Msk:  Standing:  almost no arch.  Knees/hip rotated slightly medially.  Arch collapsed and foot is pronated bilateral.    marked genu valgus on standing chronic DJD changes on exam  Extremities:  No clubbing, cyanosis, edema, or deformity noted  Skin:  bilateral palms and soles of feet with crusting papules with signs of excoriation.  dry.   Psych:  depressed appearing/frustrated appearing  Diabetes Management Exam:    Foot Exam (with socks and/or shoes not present):       Sensory-Pinprick/Light touch:          Left medial foot (L-4): normal          Left dorsal foot (L-5): normal          Left lateral foot (S-1): normal          Right medial foot (L-4): normal          Right dorsal foot (L-5): normal          Right lateral foot (S-1): normal       Sensory-Monofilament:          Left foot: normal          Right foot: normal       Inspection:          Left foot: normal          Right foot: normal       Nails:          Left foot: normal          Right foot: normal   Impression & Recommendations:  Problem # 1:  AODM (ICD-250.00) Assessment Unchanged at goal. she desires to be off insulin.  will try with januvia + glipizide.  she is to not stop taking her lantus until she has both of these to start at  once.  she understands.   Her updated medication list for this problem includes:    Benazepril Hcl 40 Mg Tabs (Benazepril hcl) .Marland Kitchen... Take 1 tablet by mouth once a day for blood pressure and kidney protection    Metformin Hcl 1000 Mg Tabs (Metformin  hcl) ..... Take 1 tablet by mouth twice a day for diabetes    Lantus Solostar 100 Unit/ml Soln (Insulin glargine) .Marland KitchenMarland KitchenMarland KitchenMarland Kitchen 34 units each morning quantity sufficient for 1 mo supply    Baby Aspirin 81 Mg Chew (Aspirin) ..... One daily    Glipizide 10 Mg Xr24h-tab (Glipizide) .Marland Kitchen... 1 tab daily for diabetes control.    Januvia 100 Mg Tabs (Sitagliptin phosphate) .Marland Kitchen... 1 by mouth once daily for diabetes.  Orders: A1C-[facility] (04540) [facility]- Est  Level 4 (98119)  Labs Reviewed: Creat: 0.81 (03/04/2009)     Last Eye Exam: normal (07/26/2008) Reviewed HgBA1c results: 6.9 (10/15/2009)  7.3 (07/01/2009)  Problem # 2:  POLYARTHRITIS (ICD-719.49) Assessment: Deteriorated  will try to augment relief with amitryptilline.  cannot go more than tramadol due to DOT regulations.   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 Take 1 tablet by mouth once a day for blood pressure and kidney protection    Hydrochlorothiazide 25 Mg Tabs (Hydrochlorothiazide) .Marland Kitchen... 1 tablet by mouth once a day for blood pressure  BP today: 127/78 Prior BP: 112/72 (07/01/2009)  Labs Reviewed: K+: 4.3 (03/04/2009) Creat: : 0.81 (03/04/2009)   Chol: 178 (09/18/2008)   HDL: 44 (09/18/2008)   LDL: 117 (09/18/2008)   TG: 83 (09/18/2008)  Orders: [facility]- Est  Level 4 (99214)  Complete Medication List: 1)   Benazepril Hcl 40 Mg Tabs (Benazepril hcl) .... Take 1 tablet by mouth once a day for blood pressure and kidney protection 2)  Flonase 50 Mcg/act Susp (Fluticasone propionate) .... Spray 2 spray into both nostrils once a day disp 1 mo supply 3)  Hydrochlorothiazide 25 Mg Tabs (Hydrochlorothiazide) .Marland Kitchen.. 1 tablet by mouth once a day for blood pressure 4)  Metformin Hcl 1000 Mg Tabs (Metformin hcl) .... Take 1 tablet by mouth twice a day for diabetes 5)  Lantus Solostar 100 Unit/ml Soln (Insulin glargine) .... 34 units each morning quantity sufficient for 1 mo supply 6)  Baby Aspirin 81 Mg Chew (Aspirin) .... One daily 7)  Clobetasol Propionate 0.05 % Oint (Clobetasol propionate) .... Apply to affected areas on hands and feet two times a day. disp 30g 8)  Tramadol Hcl 50 Mg Tabs (Tramadol hcl) .Marland Kitchen.. 1 by mouth three times a day as needed pain.  works best when taken with extra strength tylenol 9)  Glipizide 10 Mg Xr24h-tab (Glipizide) .Marland Kitchen.. 1 tab daily for diabetes control. 10)  Januvia 100 Mg Tabs (Sitagliptin phosphate) .Marland Kitchen.. 1 by mouth once daily for diabetes. 11)  Amitriptyline Hcl 100 Mg Tabs (Amitriptyline hcl) .... 1/2 -1  by mouth at night daily for pain.  Patient Instructions: 1)  Please follow up with me in June for pap smear.  make this appt in the AM so we can check cholsterol, etc.   2)  Please schedule your mammogram 3)  I have sent over your new prescriptions for stopping lantus and starting januvia + glipizide.  Wait until you have both before stopping your lantus. 4)  Use the new ointment - clobetasol - with saran wrap over your hands at night and socks on your feet  at night to help the rash get better.  Prescriptions: AMITRIPTYLINE HCL 100 MG TABS (AMITRIPTYLINE HCL) 1/2 -1  by mouth at night daily for pain.  #30 x 3   Entered and Authorized by:   Ancil Boozer  MD   Signed by:   Ancil Boozer  MD on 10/15/2009   Method used:   Electronically to        [facility]   [phone]* (retail)       [address]       [place], [place]  [address]       Ph: [phone] or [phone]       Fax: [phone]   RxID:   [id number] JANUVIA 100 MG TABS (SITAGLIPTIN PHOSPHATE) 1 by mouth once daily for diabetes.  #90 x 3   Entered and Authorized by:   Ancil Boozer  MD   Signed by:   Ancil Boozer  MD on 10/15/2009   Method used:   Faxed to ...       [facility] (retail)       [address]       [place], [place]  [address]       Ph: [phone]       Fax: [phone]   RxID:   [id number] GLIPIZIDE 10 MG XR24H-TAB (GLIPIZIDE) 1 tab daily for diabetes control.  #30 x 3   Entered and Authorized by:   Ancil Boozer  MD   Signed by:   Ancil Boozer  MD on 10/15/2009   Method used:   Electronically to        [facility]  [phone]* (retail)       [address]       [place], [place]  [address]       Ph: [phone] or [phone]       Fax: [phone]   RxID:   [id number] CLOBETASOL PROPIONATE 0.05 % OINT (CLOBETASOL PROPIONATE) apply to affected areas on hands and feet two times a day. Disp 30g  #30 x 3   Entered and Authorized by:   Ancil Boozer  MD   Signed by:   Ancil Boozer  MD on 10/15/2009   Method used:   Electronically to        [facility]  [phone]* (retail)       [address]       [place], [place]  [address]       Ph: [phone] or [phone]       Fax: [phone]   RxID:   [id number]   Laboratory Results   Blood Tests   Date/Time Received: October 15, 2009 1:45 PM  Date/Time Reported: October 15, 2009 2:20 PM   HGBA1C: 6.9%   (Normal Range: Non-Diabetic - 3-6%   Control Diabetic - 6-8%)  Comments: ...............test performed by.................Marland KitchenGaren Grams, LPN .............entered by...........Marland KitchenBonnie A. Swazil[place], MLS (ASCP)cm     Prevention & Chronic Care Immunizations    Influenza vaccine: Fluvax Non-MCR  (03/27/2008)   Influenza vaccine deferral: Not available  (03/04/2009)   Influenza vaccine due: 03/27/2009    Tetanus booster: 02/27/2004: Done.   Tetanus booster due: 02/26/2014    Pneumococcal vaccine: Not documented  Other Screening   Pap smear: normal  (06/28/2006)   Pap smear due: 06/28/2009    Mammogram: normal  (07/19/2008)  Mammogram due: 07/19/2009   Smoking status: never  (10/15/2009)  Diabetes Mellitus   HgbA1C: 6.9  (10/15/2009)   Hemoglobin A1C due: 06/07/2008    Eye exam: normal  (07/26/2008)   Eye exam due: 07/26/2009    Foot exam: yes  (10/15/2009)   Foot exam action/deferral: Do today   High risk foot: Not documented   Foot care education: Not documented    Urine microalbumin/creatinine ratio: Not documented   Urine microalbumin action/deferral: Not indicated    Diabetes flowsheet reviewed?: Yes   Progress toward A1C goal: At goal  Lipids   Total Cholesterol: 178  (09/18/2008)   LDL: 117  (09/18/2008)   LDL Direct: Not documented   HDL: 44  (09/18/2008)   Triglycerides: 83  (09/18/2008)    SGOT (AST): 16  (03/04/2009)   SGPT (ALT): 12  (03/04/2009)   Alkaline phosphatase: 58  (03/04/2009)   Total bilirubin: 0.5  (03/04/2009)    Lipid flowsheet reviewed?: Yes   Progress toward LDL goal: Unchanged   Lipid comments: needs FLP - will check at next visit.  Hypertension   Last Blood Pressure: 127 / 78  (10/15/2009)   Serum creatinine: 0.81  (03/04/2009)   Serum potassium 4.3  (03/04/2009)    Hypertension flowsheet reviewed?: Yes   Progress toward BP goal: At goal  Self-Management Support :   Personal Goals (by the next clinic visit) :     Personal A1C goal: 8  (03/04/2009)     Personal blood pressure goal: 130/80  (03/04/2009)     Personal LDL goal: 100  (03/04/2009)    Diabetes self-management support: Written self-care plan, Referred for medical nutrition therapy  (03/04/2009)    Diabetes  self-management support not done because: Good outcomes  (10/15/2009)    Hypertension self-management support: Written self-care plan  (03/04/2009)    Hypertension self-management support not done because: Good outcomes  (10/15/2009)    Lipid self-management support: Written self-care plan  (03/04/2009)     Lipid self-management support not done because: Not indicated  (10/15/2009)

## 2010-07-30 NOTE — Progress Notes (Signed)
Summary: phn msg  Phone Note Call from Patient Call back at [facility] Phone [phone]   Caller: Patient Summary of Call: has a question about Januvia Initial call taken by: De Nurse,  Nov 19, 2009 10:55 AM  Follow-up for Phone Call        lm Follow-up by: Golden Circle RN,  Nov 19, 2009 11:00 AM  Additional Follow-up for Phone Call Additional follow up Details #1::        pt returned call Additional Follow-up by: De Nurse,  Nov 19, 2009 11:05 AM    Additional Follow-up for Phone Call Additional follow up Details #2::    states she is no longer on insulin. wants to know if she can take all 3 diabetes drugs at the same time. 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 will give to Dr. Mauricio Po Follow-up by: Golden Circle RN,  Nov 19, 2009 12:06 PM  Additional Follow-up for Phone Call Additional follow up Details #3:: Details for Additional Follow-up Action Taken: told her that it is ok to take all 3 meds at same time. 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  Paula Compton MD  Nov 19, 2009 12:28 PM

## 2010-07-30 NOTE — Progress Notes (Signed)
Summary: Note Needed  Phone Note Call from Patient Call back at Home Phone [phone]   Caller: Patient Summary of Call: Needs note stating she can do water arobics.   Initial call taken by: Clydell Hakim,  October 24, 2009 11:19 AM  Follow-up for Phone Call        see generic letter above this.  also please let her know what the attachment to her labs says. thanks. Follow-up by: Ancil Boozer  MD,  October 24, 2009 11:24 AM  Additional Follow-up for Phone Call Additional follow up Details #1::        Patient informed that letter was left up front. Additional Follow-up by: Garen Grams LPN,  October 24, 2009 3:53 PM

## 2010-07-30 NOTE — Assessment & Plan Note (Signed)
Summary: F/U per Gerilyn Pilgrim / [facility]   Vital Signs:  Patient profile:   50 year old female Height:      65 inches Weight:      360.7 pounds BMI:     60.24  Vitals Entered By: Wyona Almas PHD (October 16, 2009 10:24 AM)  Primary Care Provider:  Ancil Boozer  MD   History of Present Illness: Assessment: Spent 30 min with pt.  Cadence has been doing Weight Watchers for  ~3 mo now, and has achieved no success.  She is very frustrated; yet refuses to stop trying.  She started walking again even though her knees are in pain.  As she did w/ Dr. Sandi Mealy, we discussed how insulin can promote wt gain.  I assured her that using all oral agents in place of insulin will require even more dietary  vigilance, and she seems to understand this well.  24-hr recall suggests kcal intake of  ~1720: B (4:45 AM)- 1 c Cheerios, 1/2 c 2% milk, 1 small banana, 6 oz yogurt, 1 c coffee, 1 tsp cream & sugar; Snk (10 AM)- 6 oz yogurt, 1 apple; L (1:30 PM)- salad w/ 2-3 tbsp vinaigrette, 1 banana, water; Snk (5:30 PM)- grapefruit; D (6:45 PM)- 2 hotdogs w/ chili, coleslaw, mustard, onions, water.  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  Some progress on inappropriate intake of types of carbohydrate (NI-53.3) related to beverages as evidenced by no SSB reported, but excessive reliance on bananas as fruit source.  Excessive fat intake (NI-51.2) related to salad dressing and meat as evidenced by use of regular dressing and hotdogs.  Inadequate vegetables.    Intervention:  See Patient Instructions.   Monitoring/Eval:  Dietary intake, body weight, and exercise at 1-mo F/U.    Allergies: No Known Drug Allergies   Complete Medication List: 1)  Benazepril Hcl 40 Mg Tabs (Benazepril hcl) .... Take 1 tablet by mouth once a day for blood pressure and kidney protection 2)  Flonase 50 Mcg/act Susp (Fluticasone propionate) .... Spray 2 spray into both  nostrils once a day disp 1 mo supply 3)  Hydrochlorothiazide 25 Mg Tabs (Hydrochlorothiazide) .Marland Kitchen.. 1 tablet by mouth once a day for blood pressure 4)  Metformin Hcl 1000 Mg Tabs (Metformin hcl) .... Take 1 tablet by mouth twice a day for diabetes 5)  Lantus Solostar 100 Unit/ml Soln (Insulin glargine) .... 34 units each morning quantity sufficient for 1 mo supply 6)  Baby Aspirin 81 Mg Chew (Aspirin) .... One daily 7)  Clobetasol Propionate 0.05 % Oint (Clobetasol propionate) .... Apply to affected areas on hands and feet two times a day. disp 30g 8)  Tramadol Hcl 50 Mg Tabs (Tramadol hcl) .Marland Kitchen.. 1 by mouth three times a day as needed pain.  works best when taken with extra strength tylenol 9)  Glipizide 10 Mg Xr24h-tab (Glipizide) .Marland Kitchen.. 1 tab daily for diabetes control. 10)  Januvia 100 Mg Tabs (Sitagliptin phosphate) .Marland Kitchen.. 1 by mouth once daily for diabetes. 11)  Amitriptyline Hcl 100 Mg Tabs (Amitriptyline hcl) .... 1/2 -1  by mouth at night daily for pain.  Other Orders: [id number]  Patient Instructions: 1)  Vitamin D:  Recheck blood level, then start supplementing as needed.    2)  Emphasis:  Limit carbohydrates to 2 per meal and one per snack.  Each ONE portion (exchange) = 15 grams of  carb.  Remember:  Carb's = starch or sugar.   3)  Reminder:  Saturated fat increases insulin resistance.  Limit animal fats, i.e., meats, butter, dairy.  (Use SKIM milk.) 4)  LOTS of veg's!  Include at both lunch and dinner meals.   5)  Daily food record.   6)  Eamil Jeannie every Thursday or Friday to report progress:  [email or web address].   7)  Follow-up appt 4 weeks.

## 2010-07-30 NOTE — Progress Notes (Signed)
  Phone Note Call from Patient   Caller: Patient Call For: [phone] Summary of Call: Need a note to participate in water aerobics.  Need this on a prescription format.  Would like to pick up on Friday due to enrollment on Monday. Initial call taken by: Abundio Miu,  June 10, 2010 12:08 PM  Follow-up for Phone Call        Wrote note on Rx pad.  Will be given to front desk for pt. to pick up.  Follow-up by: Ardyth Gal MD,  June 11, 2010 8:57 AM

## 2010-07-30 NOTE — Assessment & Plan Note (Signed)
Summary: f/u visit/bmc   Vital Signs:  Patient profile:   50 year old female Weight:      366.6 pounds Temp:     98.4 degrees F oral Pulse rate:   111 / minute Pulse rhythm:   regular BP sitting:   140 / 108  (left arm) Cuff size:   large  Vitals Entered By: Loralee Pacas CMA (May 20, 2010 1:44 PM) CC: follow-up visit Is Patient Diabetic? Yes Did you bring your meter with you today? No   Primary Provider:  Ardyth Gal MD  CC:  follow-up visit.  History of Present Illness: Valerie Carter comes in for follow up, and is still complaining of leg pain.  She says the pain shoots down the sides of her legs, and it bothers her especially at night.  She says it is worse than before.  She is still sleeping on her stomach because she cannot sleep any other way.  She has been reading on the internet and is concerned that Januvia may be causing the pain. 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  She discontinued Gabapentin that was previously started for this problem because it made her too sleepy.   Pt. has gained 8 lbs since her last visit.  Says her leg pain has made it hard to exercise and she has been under a lot of stress.  She is frusterated .  Denies depression symptoms or excessive fatigue.   She is taking all her blood pressure medications, and denies chest pain, shortness of breath or palpitations.  Pt complainign of stomach pain recently, says it happens almost every time she eats.  She does not associate it with any one type of food, but says it is crampy pain, with gas and bloating.  She says that she continues to eat cheese even though she is lactose intolerant.    Habits & Providers  Alcohol-Tobacco-Diet     Tobacco Status: never     Passive Smoke Exposure: yes  Exercise-Depression-Behavior     Have you felt down or hopeless? no     Have you felt little pleasure in things? no     Depression Counseling: not indicated; screening negative for depression  Current Medications (verified): 1)  Benazepril Hcl 40 Mg Tabs (Benazepril Hcl) .... Take 1 Tablet By Mouth Once A Day For Blood Pressure and Kidney Protection 2)  Flonase 50 Mcg/act Susp (Fluticasone Propionate) .... Spray 2 Spray Into Both Nostrils Once A Day Disp 1 Mo Supply 3)  Hydrochlorothiazide 25 Mg Tabs (Hydrochlorothiazide) .Marland Kitchen.. 1 Tablet By Mouth Once A Day For Blood Pressure 4)  Metformin Hcl 1000 Mg Tabs (Metformin Hcl) .... Take 1 Tablet By Mouth Twice A Day For Diabetes 5)  Baby Aspirin 81 Mg  Chew (Aspirin) .... One Daily 6)  Glipizide 10 Mg Tabs (Glipizide) .Marland Kitchen.. 1 By Mouth Two Times A Day For Diabetes 7)  Januvia 100 Mg Tabs (Sitagliptin Phosphate) .Marland Kitchen.. 1 By Mouth Once Daily For Diabetes. 8)  Amitriptyline Hcl 100 Mg Tabs (Amitriptyline Hcl) .... 1/2 -1  By Mouth At Night Daily For Pain.  Allergies: No Known Drug Allergies  Review of Systems       Negative except stated in HPI.   Physical Exam  General:  Well-developed,well-nourished,in no acute distress; alert,appropriate and cooperative throughout examination Mouth:  Oral mucosa and oropharynx without lesions or exudates.  Teeth in good repair. Lungs:  Normal respiratory effort, chest expands symmetrically. Lungs are clear to auscultation, no crackles or wheezes. Heart:  Normal rate and regular rhythm. S1 and S2 normal without gallop, murmur, click, rub or other extra sounds. Abdomen:  Obese, soft and non-tender, with normoactive bowel sounds. No hepatosplenomegaly noted.  Msk:  Strength 5/5 and equal in lower extremities in all muscle groups. Sensation in tact.    TTP in lower lumbar spine and adjacent muscles.  Pulses:  R and L radial,dorsalis pedis and posterior tibial pulses are full and equal bilaterally Neurologic:  DTRs symmetrical and normal.     Impression & Recommendations:  Problem # 1:  DM (ICD-250.00) Still well controlled.  Will decrease Januvia by half to see if this helps with leg pain and continue to monitor.  The following medications were removed from the medication list:    Lantus Solostar 100 Unit/ml Soln (Insulin glargine) .Marland KitchenMarland KitchenMarland KitchenMarland Kitchen 34 units each morning quantity sufficient for 1 mo supply Her updated medication list for this problem includes:    Benazepril Hcl 40 Mg Tabs (Benazepril hcl) .Marland Kitchen... Take 1 tablet by mouth once a day for blood pressure and kidney protection    Metformin Hcl 1000 Mg Tabs (Metformin hcl) .Marland Kitchen... Take 1 tablet by mouth twice a day for diabetes    Baby Aspirin 81 Mg Chew (Aspirin) ..... One daily    Glipizide 10 Mg Tabs (Glipizide) .Marland Kitchen... 1 by mouth two times a day for diabetes    Januvia 50 Mg Tabs (Sitagliptin phosphate) .Marland Kitchen... 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 still complaining of this problem, which has gotten worse.  I think it is unlikely that her pain is a side effect of Januvia.  I think it is most likely related to her ovesity and sleeping on her stomach.  She describes nerve pain but does not want to continue Gabapentin.  Will try to work on her weight as below.  The following medications were removed from the medication list:    Tramadol Hcl 50 Mg Tabs (Tramadol hcl) .Marland Kitchen... 1 by mouth three times a day as needed pain.  works best when taken with extra strength tylenol Her updated medication list for this problem includes:    Baby Aspirin 81 Mg Chew (Aspirin) ..... One daily  Orders: [facility]- Est  Level 4 (87564)  Problem # 3:  OBESITY NOS (ICD-278.00) Pt. with weight gain and she is concerned with the Holidays approaching.  She has seen Dr. Gerilyn Pilgrim in the past and had some progress, but has not seen her in many months.  Will refer Ms. Rolston back to Dr. Gerilyn Pilgrim for weight loss assistance.  Orders: [facility]- Est  Level 4 (33295)  Problem # 4:  HYPERTENSION, BENIGN ESSENTIAL (ICD-401.1) Pt. with  increased blood pressure, possibly due to weight gain.  Will start Amlodipine today, but have told patient that if she can lose some weight she may not need to stay on it.  Her updated medication list for this problem includes:    Benazepril Hcl 40 Mg Tabs (Benazepril hcl) .Marland Kitchen... Take 1 tablet by mouth once a day for blood pressure and kidney protection    Hydrochlorothiazide 25 Mg Tabs (Hydrochlorothiazide) .Marland Kitchen... 1 tablet by mouth once a day for blood pressure    Amlodipine Besylate 5 Mg Tabs (Amlodipine besylate) ..... One by mouth daily  Complete Medication List: 1)  Benazepril Hcl 40 Mg Tabs (Benazepril hcl) .... Take 1 tablet by mouth once a day for blood pressure and kidney protection 2)  Flonase 50 Mcg/act Susp (Fluticasone propionate) .... Spray 2 spray into both nostrils once a day disp 1 mo supply 3)  Hydrochlorothiazide 25 Mg Tabs (Hydrochlorothiazide) .Marland Kitchen.. 1 tablet by mouth once a day for blood pressure 4)  Metformin Hcl 1000 Mg Tabs (Metformin hcl) .... Take 1 tablet by mouth twice a day for diabetes 5)  Baby Aspirin 81 Mg Chew (Aspirin) .... One daily 6)  Glipizide 10 Mg Tabs (Glipizide) .Marland Kitchen.. 1 by mouth two times a day for diabetes 7)  Januvia 50 Mg Tabs (Sitagliptin phosphate) .... Take one by mouth daily 8)  Amitriptyline Hcl 100 Mg Tabs (Amitriptyline hcl) .... 1/2 -1  by mouth at night daily for pain. 9)  Amlodipine Besylate 5 Mg Tabs (Amlodipine besylate) .... One by mouth daily  Other Orders: A1C-[facility] (16109)  Patient Instructions: 1)  It was good to see you today.  I am going to cut your Januvia dose in half to see if your leg pain will improve. 2)  Try to cut dairy (even  cheese) out of your diet to see if your stomach pain improves.  3)  I am going to start another blood pressure medication called Amlodipine today. 4)  I want you to make an appointment to see Dr. Gerilyn Pilgrim again to work on life-style modification for weight loss.  Please be mindful of your calorie intake  during the holidays.   5)  Happy Thanksgiving.  Prescriptions: AMLODIPINE BESYLATE 5 MG TABS (AMLODIPINE BESYLATE) one by mouth daily  #30 x 3   Entered and Authorized by:   Ardyth Gal MD   Signed by:   Ardyth Gal MD on 05/20/2010   Method used:   Electronically to        [facility]  [phone]* (retail)       [address]       [place], [place]  [address]       Ph: [phone] or [phone]       Fax: [phone]   RxID:   [id number] AMLODIPINE BESYLATE 5 MG TABS (AMLODIPINE BESYLATE) one by mouth daily  #30 x 3   Entered and Authorized by:   Ardyth Gal MD   Signed by:   Ardyth Gal MD on 05/20/2010   Method used:   Print then Give to Patient   RxID:   [id number] JANUVIA 50 MG TABS (SITAGLIPTIN PHOSPHATE) take one by mouth daily  #30 x 3   Entered and Authorized by:   Ardyth Gal MD   Signed by:   Ardyth Gal MD on 05/20/2010   Method used:   Print then Give to Patient   RxID:   [id number] AMLODIPINE BESYLATE 5 MG TABS (AMLODIPINE BESYLATE) one by mouth daily  #30 x 3   Entered and Authorized by:   Ardyth Gal MD   Signed by:   Ardyth Gal MD on 05/20/2010   Method used:   Print then Give to Patient   RxID:   [id number] JANUVIA 50 MG TABS (SITAGLIPTIN PHOSPHATE) take one by mouth daily  #30 x 3   Entered and Authorized by:   Ardyth Gal MD   Signed by:   Ardyth Gal MD on 05/20/2010   Method used:   Print then Give to Patient   RxID:   [id number]  Orders Added: 1)  A1C-[facility] [83036] 2)  [facility]- Est  Level 4 [16109]    Laboratory Results   Blood Tests   Date/Time Received: May 20, 2010 1:48 PM  Date/Time Reported: May 20, 2010 2:39 PM   HGBA1C: 6.8%   (Normal Range: Non-Diabetic - 3-6%   Control Diabetic - 6-8%)  Comments: ...............test performed by......Marland KitchenBonnie A. [place], MLS (ASCP)cm

## 2010-07-30 NOTE — Letter (Signed)
Summary: Lipid Letter  [facility]  [address]   [place], [place] [address]   Phone: [phone]  Fax: [phone]    10/23/2009  [name] [address] [place], [place]  [address]  Dear Valerie Carter:  We have carefully reviewed your last lipid profile from 10/22/2009 and the results are noted below with a summary of recommendations for lipid management.    Cholesterol:       166     Goal: < 200   HDL "good" Cholesterol:   42     Goal: > 40   LDL "bad" Cholesterol:   107     Goal: < 100   Triglycerides:       85     Goal: < 150    Overall these numbers are okay but something we should keep an eye on over time.  Below are some ideas to help keep them good and possibly even make better.  If you have questions please feel free to call the family practice center.     TLC Diet (Therapeutic Lifestyle Change): Saturated Fats & Transfatty acids should be kept < 7% of total calories ***Reduce Saturated Fats Polyunstaurated Fat can be up to 10% of total calories Monounsaturated Fat Fat can be up to 20% of total calories Total Fat should be no greater than 25-35% of total calories Carbohydrates should be 50-60% of total calories Protein should be approximately 15% of total calories Fiber should be at least 20-30 grams a day ***Increased fiber may help lower LDL Total Cholesterol should be < 200mg /day Consider adding plant stanol/sterols to diet (example: Benacol spread) ***A higher intake of unsaturated fat may reduce Triglycerides and Increase HDL    Adjunctive Measures (may lower LIPIDS and reduce risk of Heart Attack) include: Aerobic Exercise (20-30 minutes 3-4 times a week) Limit Alcohol Consumption Weight Reduction Aspirin 75-81 mg a day by mouth (if not allergic or contraindicated) Dietary Fiber 20-30 grams a day by mouth     Current Medications: 1)    Benazepril Hcl 40 Mg Tabs (Benazepril hcl) .... Take 1 tablet by mouth once a day for blood pressure and  kidney protection 2)    Flonase 50 Mcg/act Susp (Fluticasone propionate) .... Spray 2 spray into both nostrils once a day disp 1 mo supply 3)    Hydrochlorothiazide 25 Mg Tabs (Hydrochlorothiazide) .Marland Kitchen.. 1 tablet by mouth once a day for blood pressure 4)    Metformin Hcl 1000 Mg Tabs (Metformin hcl) .... Take 1 tablet by mouth twice a day for diabetes 5)    Lantus Solostar 100 Unit/ml  Soln (Insulin glargine) .... 34 units each morning quantity sufficient for 1 mo supply 6)    Baby Aspirin 81 Mg  Chew (Aspirin) .... One daily 7)    Clobetasol Propionate 0.05 % Crea (Clobetasol propionate) .... Apply to affected areas two times a day.  disp 15g 8)    Tramadol Hcl 50 Mg Tabs (Tramadol hcl) .Marland Kitchen.. 1 by mouth three times a day as needed pain.  works best when taken with extra strength tylenol 9)    Glipizide 10 Mg Tabs (Glipizide) .Marland Kitchen.. 1 by mouth two times a day for diabetes 10)    Januvia 100 Mg Tabs (Sitagliptin phosphate) .Marland Kitchen.. 1 by mouth once daily for diabetes. 11)    Amitriptyline Hcl 100 Mg Tabs (Amitriptyline hcl) .... 1/2 -1  by mouth at night daily for pain.  If you have any questions, please call. We appreciate  being able to work with you.   Sincerely,    [facility] Ancil Boozer  MD  Appended Document: Lipid Letter mailed.

## 2010-07-30 NOTE — Progress Notes (Signed)
Summary: change meds  Phone Note Call from Patient Call back at Home Phone [phone]   Caller: Patient Summary of Call: Glipizide cost too much and wants to change to the MAP program  also ointment that was called in coist too much and wants to know if there is something cheaper - [facility] [address] Initial call taken by: De Nurse,  October 16, 2009 11:50 AM  Follow-up for Phone Call        can change to glipizide to short acting two times a day dosing [facility] for $4 or could send to MAP - please ask her preference and okay to either send current rx to MAP program or change to glipizide 10mg  by mouth two times a day Disp #60 with 3RF and send to [facility] [address].  as far as ointment goes I [facility] cheapest they have is $12.78 for 15g temovate 0.05% cream.  i have sent this with refills for her to try Follow-up by: Ancil Boozer  MD,  October 16, 2009 2:45 PM  Additional Follow-up for Phone Call Additional follow up Details #1::        Unable to reach pt.  LMOVM informing her that 2 rx were called into [facility] [address].  Advised she may call back monday with questions.  Steph, I called these in verbally, please put them in pts chart. Additional Follow-up by: Jone Baseman CMA,  October 16, 2009 5:26 PM    New/Updated Medications: CLOBETASOL PROPIONATE 0.05 % CREA (CLOBETASOL PROPIONATE) apply to affected areas two times a day.  Disp 15g GLIPIZIDE 10 MG TABS (GLIPIZIDE) 1 by mouth two times a day for diabetes Prescriptions: GLIPIZIDE 10 MG TABS (GLIPIZIDE) 1 by mouth two times a day for diabetes  #60 x 3   Entered and Authorized by:   Ancil Boozer  MD   Signed by:   Ancil Boozer  MD on 10/17/2009   Method used:   Telephoned to ...       [facility]  [address]  [phone]* (retail)       [address]       [place], [place]  [address]       Ph: [phone] or [phone]       Fax: [phone]   RxID:    [id number] CLOBETASOL PROPIONATE 0.05 % CREA (CLOBETASOL PROPIONATE) apply to affected areas two times a day.  Disp 15g  #15 x 6   Entered and Authorized by:   Ancil Boozer  MD   Signed by:   Ancil Boozer  MD on 10/16/2009   Method used:   Electronically to        [facility]  [phone]* (retail)       [address]       [place], [place]  [address]       Ph: [phone] or [phone]       Fax: [phone]   RxID:   [id number]

## 2010-07-30 NOTE — Miscellaneous (Signed)
Summary: Orders Update  Clinical Lists Changes  Problems: Added new problem of ENCOUNTER FOR LONG-TERM USE OF OTHER MEDICATIONS (ICD-V58.69) Orders: Added new Test order of B12-[facility] [id number]) - Signed Added new Test order of CBC-[facility] (56213) - Signed   Ok per Dr. Lula Olszewski

## 2010-07-30 NOTE — Miscellaneous (Signed)
  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

## 2010-07-30 NOTE — Consult Note (Signed)
Summary: [facility]   Imported By: Clydell Hakim 12/12/2009 16:12:18  _____________________________________________________________________  External Attachment:    Type:   Image     Comment:   External Document

## 2010-07-30 NOTE — Assessment & Plan Note (Signed)
Summary: F/U APPT/KH   Vital Signs:  Patient profile:   50 year old female Height:      65 inches Weight:      364.4 pounds BMI:     60.86  Vitals Entered By: Valerie Almas PHD (January 01, 2010 11:43 AM)  Primary Care Provider:  Ancil Boozer  MD   History of Present Illness: Assessment:  Spent 30 min w/ pt.  Valerie Carter is frustrated that exercise is so difficult for her d/t knee pain, and she has not been able to restrict food intake enough to lose wt.  She continues to use the [employer], but has not been consistent in tracking intake and [employer] points.  24-hr recall suggests intake of  ~1230 kcal: B (9 AM)- coffee w/ 2 t sugar & 2 t creamer; L (11:15 AM)- [employer] salad w/ 2 T [place] dressing, chx parmesan, 10 pc fried okra, 1 bite cornbread, 16 oz sweet tea, small pc of cake;Snk (PM)- water; D (8:15 PM)- 2 c watermelon, 1 tomato, water.    Nutrition Diagnosis:   Inadequate exercise related to knee pain and sedentary job as evidenced by only exercise being water ex max of 2 X wk.  Regression on inappropriate intake of types of carbohydrate (NI-53.3) related to beverages as evidenced by 16 oz sweet tea yesterday (which Valerie Carter said is a rare choice).  Excessive fat intake (NI-51.2) related to salad dressing and meat as evidenced by use of regular dressing and restaurant meal.  Inadequate vegetables.    Intervention:  See Patient Instructions.   Monitoring/Eval:  Dietary intake, body weight, and exercise at 2-wk F/U.    Allergies: No Known Drug Allergies   Complete Medication List: 1)  Benazepril Hcl 40 Mg Tabs (Benazepril hcl) .... Take 1 tablet by mouth once a day for blood pressure and kidney protection 2)  Flonase 50 Mcg/act Susp (Fluticasone propionate) .... Spray 2 spray into both nostrils once a day disp 1 mo supply 3)  Hydrochlorothiazide 25 Mg Tabs (Hydrochlorothiazide) .Marland Kitchen.. 1 tablet by mouth once a day for blood pressure 4)  Metformin Hcl 1000 Mg Tabs (Metformin hcl) ....  Take 1 tablet by mouth twice a day for diabetes 5)  Lantus Solostar 100 Unit/ml Soln (Insulin glargine) .... 34 units each morning quantity sufficient for 1 mo supply 6)  Baby Aspirin 81 Mg Chew (Aspirin) .... One daily 7)  Clobetasol Propionate 0.05 % Crea (Clobetasol propionate) .... Apply to affected areas two times a day.  disp 15g 8)  Tramadol Hcl 50 Mg Tabs (Tramadol hcl) .Marland Kitchen.. 1 by mouth three times a day as needed pain.  works best when taken with extra strength tylenol 9)  Glipizide 10 Mg Tabs (Glipizide) .Marland Kitchen.. 1 by mouth two times a day for diabetes 10)  Januvia 100 Mg Tabs (Sitagliptin phosphate) .Marland Kitchen.. 1 by mouth once daily for diabetes. 11)  Amitriptyline Hcl 100 Mg Tabs (Amitriptyline hcl) .... 1/2 -1  by mouth at night daily for pain. 12)  Urea 40 % Crea (Urea) .... Apply bid prn    disp 1 small tube  Other Orders: Reassessment Each 15 min unit- [facility] (11914)  Patient Instructions: 1)  You need more sleep!  Goal:  Bedtime of 10 PM at least 3 X wk.   2)  TRACK the number of times/week you make it by 10.   3)  Distribute your foods more evenly throughout the day, beginning with at least a small breakfast.   4)  Increase vegetables!  Aim for at least 2 X day.  5)  LIMIT fats, fried foods, and sweets, including beverages.   6)  TRACK YOUR INTAKE OR AT LEAST [employer] POINTS DAILY.    7)  Schedule a follow-up appt for 2 wk.

## 2010-07-30 NOTE — Miscellaneous (Signed)
Summary: Weight Check  Clinical Lists Changes Sherion has been doing [employer] for  ~3 wk now (signed up for 12), and is tracking daily intake and points carefully.  I advised her to be sure to eat up to the # points allowed (50) rather than restricting too much.  Encouraged exploring [employer] scholarship for water ex, which she's not done.  Also emphasized importance of general daily activity.   Observations: Added new observation of WEIGHT: 353.6 lb (07/17/2009 12:53)

## 2010-07-30 NOTE — Assessment & Plan Note (Signed)
Summary: f/u dm & meds/[place]   Vital Signs:  Patient profile:   50 year old female Height:      65 inches Weight:      365.6 pounds BMI:     61.06 Temp:     98.1 degrees F oral Pulse rate:   106 / minute BP sitting:   125 / 80  (left arm) Cuff size:   large  Vitals Entered By: Garen Grams LPN (December 02, 1608 11:23 AM) CC: multiple concerns Is Patient Diabetic? Yes Did you bring your meter with you today? No Pain Assessment Patient in pain? yes     Location: back and legs   Primary Care Provider:  Ancil Boozer  MD  CC:  multiple concerns.  History of Present Illness: DM: had 1 episode of low sugar to 70s and was symptomatic.  none since with sugars overall that she is very pleased with.  she brings her meter today and her 7 day average is 121. she states she had low sugar because she hadn't eaten and was doing lots of work around the house.  otherwise she reports that she is tolerating her medications well. needs DMV form completed  tingling in thighs r>L now for a few months.  was helped by getting back into water aerobics. she thinks sometimes they even feel a little swollen.  she has tried changing sleeping positions without relief.  she denies weakness in her legs  R ankle/heel pain: has been a problem for at least 1 year.  saw sports med but their rx didn't help.  sometimes back of heel will swell as well as ankle.  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  hasn't tried any partiucalar meds to help.    L heel rash: she has been picking at it.  itches and a little painful.  thickened skin.  no bleeding or warmth or redness  Habits & Providers  Alcohol-Tobacco-Diet     Tobacco Status: never  Current Medications (verified): 1)  Benazepril Hcl 40 Mg Tabs (Benazepril Hcl) .... Take 1 Tablet By Mouth Once A Day For Blood Pressure and Kidney Protection 2)  Flonase 50 Mcg/act Susp (Fluticasone Propionate) .... Spray 2 Spray Into Both  Nostrils Once A Day Disp 1 Mo Supply 3)  Hydrochlorothiazide 25 Mg Tabs (Hydrochlorothiazide) .Marland Kitchen.. 1 Tablet By Mouth Once A Day For Blood Pressure 4)  Metformin Hcl 1000 Mg Tabs (Metformin Hcl) .... Take 1 Tablet By Mouth Twice A Day For Diabetes 5)  Lantus Solostar 100 Unit/ml  Soln (Insulin Glargine) .... 34 Units Each Morning Quantity Sufficient For 1 Mo Supply 6)  Baby Aspirin 81 Mg  Chew (Aspirin) .... One Daily 7)  Clobetasol Propionate 0.05 % Crea (Clobetasol Propionate) .... Apply To Affected Areas Two Times A Day.  Disp 15g 8)  Tramadol Hcl 50 Mg Tabs (Tramadol Hcl) .Marland Kitchen.. 1 By Mouth Three Times A Day As Needed Pain.  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 1 By Mouth Two Times A Day For Diabetes 10)  Januvia 100 Mg Tabs (Sitagliptin Phosphate) .Marland Kitchen.. 1 By Mouth Once Daily For Diabetes. 11)  Amitriptyline Hcl 100 Mg Tabs (Amitriptyline Hcl) .... 1/2 -1  By Mouth At Night Daily For Pain. 12)  Urea 40 % Crea (Urea) .... Apply Bid Prn    Disp 1 Small Tube  Allergies (verified): No Known Drug Allergies  Past  History:  Past Medical History: Bilateral knee arthritis, polyarthritis  DM dx`d 2004,  G4P0040 w/ 4 SABs between 6-8 weeks; 1 D&C,  HTN dx`d 1999,  Migraines,  Poor dentition--referred to [place] 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  obese.  VS noted Lungs:  normal respiratory effort and normal breath sounds.   Heart:  normal rate, regular rhythm, and no murmur.   Msk:  Arch collapsed and foot is pronated bilateral.  pain over posterior heel on R foot with palpation.  fROM of ankle.  no swelling noted today.      Extremities:  sensation intact over bilateral thighs where she complains  of parasthesias.  normal gait for her (antalgic at baseline due to knee OA) Skin:  R heel with areas of thickened skin.  no breakdown of skin.     Impression & Recommendations:  Problem # 1:  AODM (ICD-250.00) Assessment Unchanged  continue with 1/2 tablet of glipizide for now two times a day and monitor.  may need increase back to 1 tablet two times a day.   DMV form completed - safe for driving.  Her updated medication list for this problem includes:    Benazepril Hcl 40 Mg Tabs (Benazepril hcl) .Marland Kitchen... Take 1 tablet by mouth once a day for blood pressure and kidney protection    Metformin Hcl 1000 Mg Tabs (Metformin hcl) .Marland Kitchen... Take 1 tablet by mouth twice a day for diabetes    Lantus Solostar 100 Unit/ml Soln (Insulin glargine) .Marland KitchenMarland KitchenMarland KitchenMarland Kitchen 34 units each morning quantity sufficient for 1 mo supply    Baby Aspirin 81 Mg Chew (Aspirin) ..... One daily    Glipizide 10 Mg Tabs (Glipizide) .Marland Kitchen... 1 by mouth two times a day for diabetes    Januvia 100 Mg Tabs (Sitagliptin phosphate) .Marland Kitchen... 1 by mouth once daily for diabetes.  Labs Reviewed: Creat: 0.86 (10/22/2009)     Last Eye Exam: normal (07/26/2008) Reviewed HgBA1c results: 6.9 (10/15/2009)  7.3 (07/01/2009)  Orders: [facility]- Est  Level 4 (29528)  Problem # 2:  FOOT PAIN, BILATERAL (ICD-729.5) Assessment: Deteriorated  ? achilles tendonitis.  trial of heel cups to see if helps.   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  suspect this is likely posititional or due to her weight. monitor over time  Orders: [facility]- Est  Level 4  (72536)  Complete Medication List: 1)  Benazepril Hcl 40 Mg Tabs (Benazepril hcl) .... Take 1 tablet by mouth once a day for blood pressure and kidney protection 2)  Flonase 50 Mcg/act Susp (Fluticasone propionate) .... Spray 2 spray into both nostrils once a day disp 1 mo supply 3)  Hydrochlorothiazide 25 Mg Tabs (Hydrochlorothiazide) .Marland Kitchen.. 1 tablet by mouth once a day for blood pressure 4)  Metformin Hcl 1000 Mg Tabs (Metformin hcl) .... Take 1 tablet by mouth twice a day for diabetes 5)  Lantus Solostar 100 Unit/ml Soln (Insulin glargine) .... 34 units each morning quantity sufficient for 1 mo supply 6)  Baby Aspirin 81 Mg Chew (Aspirin) .... One daily 7)  Clobetasol Propionate 0.05 % Crea (Clobetasol propionate) .... Apply to affected areas two times a day.  disp 15g 8)  Tramadol Hcl 50 Mg Tabs (Tramadol hcl) .Marland Kitchen.. 1 by mouth three times a day as needed pain.  works best when taken with extra strength tylenol 9)  Glipizide 10 Mg Tabs (Glipizide) .Marland Kitchen.. 1 by mouth two times a day for diabetes 10)  Januvia 100 Mg Tabs (Sitagliptin phosphate) .Marland Kitchen.. 1 by mouth once daily for diabetes. 11)  Amitriptyline Hcl 100 Mg Tabs (Amitriptyline hcl) .... 1/2 -1  by mouth at night daily for pain. 12)  Urea 40 % Crea (Urea) .... Apply bid prn    disp 1 small tube  Patient Instructions: 1)  Keep an eye on your heel and if it gets worse let me know. 2)  try the new cream to the area to see if it helps. 3)  Try the heel cups as well.  Prescriptions: GLIPIZIDE 10 MG TABS (GLIPIZIDE) 1 by mouth two times a day for diabetes  #60 x 3   Entered and Authorized by:   Ancil Boozer  MD   Signed by:   Ancil Boozer  MD on 12/02/2009   Method used:   Faxed to ...       [facility] (retail)       [address] [place], [place]  [address]       Ph: [phone]       Fax: [phone]   RxID:   [id number] UREA 40 % CREA (UREA) apply BID prn    disp 1 small tube  #1 x 6   Entered and  Authorized by:   Ancil Boozer  MD   Signed by:   Ancil Boozer  MD on 12/02/2009   Method used:   Electronically to        [facility]  [phone]* (retail)       [address]       [place], [place]  [address]       Ph: [phone] or [phone]       Fax: [phone]   RxID:   [id number] GLIPIZIDE 10 MG TABS (GLIPIZIDE) 1 by mouth two times a day for diabetes  #60 x 3   Entered and Authorized by:   Ancil Boozer  MD   Signed by:   Ancil Boozer  MD on 12/02/2009   Method used:   Print then Give to Patient   RxID:   [id number]

## 2010-07-30 NOTE — Assessment & Plan Note (Signed)
Summary: f/up,tcb   Vital Signs:  Patient profile:   50 year old female Height:      65 inches Weight:      358.5 pounds BMI:     59.87 Temp:     97.5 degrees F oral Pulse rate:   94 / minute BP sitting:   112 / 72  (left arm) Cuff size:   large  Vitals Entered By: Garen Grams LPN (July 01, 2009 10:09 AM) CC: f/u dm, htn, joint pains Is Patient Diabetic? Yes Did you bring your meter with you today? No   Primary Care Provider:  Ancil Boozer  MD  CC:  f/u dm, htn, and joint pains.  History of Present Illness: DM: checking sugars regularly. taking meds as prescribed. AM sugars typically 110-120.  post prandials 140-150.  she denies any sugars in the 200s. denies having any low sugars or symptoms of low sugars.   HTN: again, taking meds as prescribed.  takes them at bedtime because if takes them in AM she will have some dizziness.  is working on weight loss through [facility] and watching her diet more closely as well to try to help.  denies other side effects from medicines - no chest pain, no shortness of breath, no edema  joint pains: contines to have intermittent joint discomfort.  preivious meds haven't helped but she hasn't tried anything like tramadol before.  is interested in giving it a try.  knows her weight is part of her joint problems thus  why she has joined [facility].    preventative care: wants to make sure she is up to date on all of her screening.    Habits & Providers  Alcohol-Tobacco-Diet     Tobacco Status: never  Current Medications (verified): 1)  Benazepril Hcl 40 Mg Tabs (Benazepril Hcl) .... Take 1 Tablet By Mouth Once A Day For Blood Pressure and Kidney Protection 2)  Flonase 50 Mcg/act Susp (Fluticasone Propionate) .... Spray 2 Spray Into Both Nostrils Once A Day Disp 1 Mo Supply 3)  Hydrochlorothiazide 25 Mg Tabs (Hydrochlorothiazide) .Marland Kitchen.. 1 Tablet By Mouth Once A Day For Blood Pressure 4)  Metformin Hcl 1000 Mg Tabs (Metformin Hcl)  .... Take 1 Tablet By Mouth Twice A Day For Diabetes 5)  Lantus Solostar 100 Unit/ml  Soln (Insulin Glargine) .... 34 Units Each Morning Quantity Sufficient For 1 Mo Supply 6)  Baby Aspirin 81 Mg  Chew (Aspirin) .... One Daily 7)  Triamcinolone Acetonide 0.1 % Oint (Triamcinolone Acetonide) .... Apply To Affected Area Two Times A Day Until 2-3 Days After Better.  Disp 30g Tube 8)  Tramadol Hcl 50 Mg Tabs (Tramadol Hcl) .Marland Kitchen.. 1 By Mouth Three Times A Day As Needed Pain.  Works [employer] When Taken With Human resources officer Tylenol  Allergies (verified): No Known Drug Allergies  Past History:  Past medical, surgical, family and social histories (including risk factors) reviewed for relevance to current acute and chronic problems.  Past Medical History: Reviewed history from 04/09/2008 and no changes required. Bilateral knee arthritis, Clinic for probable extraction of broken teeth, DM dx`d 2004, G4P0040 w/ 4 SABs between 6-8 weeks; 1 D&C, HTN dx`d 1999, Migraines, Poor dentition--referred to [facility]. [facility]  Past Surgical History: Reviewed history from 08/25/2006 and no changes required. 2-D echo: EF 55-65%, mild dil LV/LA, mild/mod increased LV wall thickness - 09/27/2002, Cr: 0.7 - 10/14/2004, Endometrial Bx: benign - 09/27/2002, Holter: NSR, rare PVC`s, 1 run afib w/ aberrant conduction -  09/27/2002, MRI bilat knees: degen arthritis - 04/29/2003, R knee surgery (torn meniscus) - 06/28/1997  Family History: Reviewed history from 08/25/2006 and no changes required. Breast CA - maternal aunt, Diabetes - sister and aunt, Hypertension - mother and sister  Social History: Reviewed history from 11/29/2007 and no changes required. Separated from husband. No children. Lives with sister. 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 No smoking. Occ. alcohol intake (1 glass of cognac q 3 mon). Diet primarily fried food and fast food, has increased vegetables in her diet.   Currently exercising 4 times per week for 45 minutes.    Has medical Assistance Program.  Review of Systems       per HPI.  denies recent cold symptoms or fevers, denies abdominal pain.  Physical Exam  General:  alert and well-developed.  obese Lungs:  normal respiratory effort and normal breath sounds.   Heart:  normal rate, regular rhythm, and no murmur.   Abdomen:  soft, non-tender, no distention, and no masses.   Extremities:  No clubbing, cyanosis, edema, or deformity noted with normal full range of motion of all joints.     Impression & Recommendations:  Problem # 1:  AODM (ICD-250.00) Assessment Unchanged overall stable.  no changes.  continue to monitor and keep up with screening.   The following medications were removed from the medication list:    Byetta 10 Mcg Pen 10 Mcg/0.10ml Soln (Exenatide) ..... Inject twice daily prior to meals Her updated medication list for this problem includes:    Benazepril Hcl 40 Mg Tabs (Benazepril hcl) .Marland Kitchen... Take 1 tablet by mouth once a day for blood pressure and kidney protection    Metformin Hcl 1000 Mg Tabs (Metformin hcl) .Marland Kitchen... Take 1 tablet by mouth twice a day for diabetes    Lantus Solostar 100 Unit/ml Soln (Insulin glargine) .Marland KitchenMarland KitchenMarland KitchenMarland Kitchen 34 units each morning quantity sufficient for 1 mo supply    Baby Aspirin 81 Mg Chew (Aspirin) ..... One daily  Orders: A1C-[facility] ([address]109) [facility]- Est  Level 4 (60454)  Problem # 2:  HYPERTENSION, BENIGN ESSENTIAL (ICD-401.1) Assessment: Unchanged at goal.  no change.s  encouraged weight loss and dietary habits that she is pursuing.   Her updated medication list for this problem includes:    Benazepril Hcl 40 Mg Tabs (Benazepril hcl) .Marland Kitchen... Take 1 tablet by mouth once a day for blood pressure and kidney protection    Hydrochlorothiazide 25 Mg Tabs (Hydrochlorothiazide) .Marland Kitchen... 1 tablet by mouth once a day for blood pressure  Orders: [facility]- Est  Level 4 (09811)  Problem # 3:  DEGENERATIVE JOINT DISEASE,  KNEES, BILATERAL (ICD-715.96) Assessment: Deteriorated try tramadol.  again encouraged weight loss.   Her updated medication list for this problem includes:    Baby Aspirin 81 Mg Chew (Aspirin) ..... One daily    Tramadol Hcl 50 Mg Tabs (Tramadol hcl) .Marland Kitchen... 1 by mouth three times a day as needed pain.  works best when taken with extra strength tylenol  Orders: [facility]- Est  Level 4 (91478)  Complete Medication List: 1)  Benazepril Hcl 40 Mg Tabs (Benazepril hcl) .... Take 1 tablet by mouth once a day for blood pressure and kidney protection 2)  Flonase 50 Mcg/act Susp (Fluticasone propionate) .... Spray 2 spray into both nostrils once a day disp 1 mo supply 3)  Hydrochlorothiazide 25 Mg Tabs (Hydrochlorothiazide) .Marland Kitchen.. 1 tablet by mouth once a day for blood pressure 4)  Metformin Hcl 1000 Mg Tabs (  Metformin hcl) .... Take 1 tablet by mouth twice a day for diabetes 5)  Lantus Solostar 100 Unit/ml Soln (Insulin glargine) .... 34 units each morning quantity sufficient for 1 mo supply 6)  Baby Aspirin 81 Mg Chew (Aspirin) .... One daily 7)  Triamcinolone Acetonide 0.1 % Oint (Triamcinolone acetonide) .... Apply to affected area two times a day until 2-3 days after better.  disp 30g tube 8)  Tramadol Hcl 50 Mg Tabs (Tramadol hcl) .Marland Kitchen.. 1 by mouth three times a day as needed pain.  works best when taken with extra strength tylenol  Patient Instructions: 1)  Keep up the good work - overall you are doing very well.   2)  I'd like to see you again in 3 months (or of course sooner if needed).  I look forward to seeing how weight watchers is going for you - I think you will do well! 3)  A few days before your appointment - make a lab appointment so we can get all of your blood work (cholesterol, etc) so we can review it at your appointment. 4)  Remember sometime in the next few months you are due for your mammogram and pap smear.  5)  It was nice to see you today! Prescriptions: TRAMADOL HCL 50 MG TABS  (TRAMADOL HCL) 1 by mouth three times a day as needed pain.  works best when taken with extra strength tylenol  #90 x 3   Entered and Authorized by:   Ancil Boozer  MD   Signed by:   Ancil Boozer  MD on 07/01/2009   Method used:   Electronically to        [facility]  [phone]* (retail)       [address]       [place], [place]  [address]       Ph: [phone] or [phone]       Fax: [phone]   RxID:   [id number]   Laboratory Results   Blood Tests   Date/Time Received: July 01, 2009 10:09 AM  Date/Time Reported: July 01, 2009 10:23 AM   HGBA1C: 7.3%   (Normal Range: Non-Diabetic - 3-6%   Control Diabetic - 6-8%)  Comments: ...............test performed by......Marland KitchenBonnie A. [place], MLS (ASCP)cm

## 2010-07-31 ENCOUNTER — Encounter: Payer: Self-pay | Admitting: Family Medicine

## 2010-07-31 ENCOUNTER — Ambulatory Visit (INDEPENDENT_AMBULATORY_CARE_PROVIDER_SITE_OTHER): Payer: Self-pay | Admitting: Family Medicine

## 2010-07-31 DIAGNOSIS — M2550 Pain in unspecified joint: Secondary | ICD-10-CM

## 2010-07-31 DIAGNOSIS — M25569 Pain in unspecified knee: Secondary | ICD-10-CM

## 2010-07-31 DIAGNOSIS — E669 Obesity, unspecified: Secondary | ICD-10-CM

## 2010-08-05 NOTE — Letter (Signed)
Summary: Generic Letter  [facility]  [address]   [place], [place] [address]   Phone: [phone]  Fax: [phone]    07/27/2010  [facility] [address] [place], [place]  [address]  Dear Ms. Fallen,  The Xrays of your knees show moderate to severe degenerative changes in both knees.    You probably need a referral to orthopedics but since you have no health insurance this is a problem.  [facility] does take patients without insurance and does some free work as well.  If you would like [place] to refer you please call and let us know.  You would need to commit to making the appointments, and with your work schedule this may not be possible.  I noticed that you did not make an appointment with the sports medicine group headed up by Dr. Darrick Penna.  They may be able to inject your knee to calm the pain, but this is temporary.  I did see that you have an appointment with Dr. Gerilyn [place] and that is very important that you begin to change your life style.      Sincerely,   Luretha Murphy NP  Appended Document: Generic Letter mailed

## 2010-08-06 ENCOUNTER — Ambulatory Visit (HOSPITAL_COMMUNITY)
Admission: RE | Admit: 2010-08-06 | Discharge: 2010-08-06 | Disposition: A | Discharge status: Home / Self Care | Payer: Self-pay | Source: Ambulatory Visit | Attending: Family Medicine | Admitting: Family Medicine

## 2010-08-06 DIAGNOSIS — Z1239 Encounter for other screening for malignant neoplasm of breast: Secondary | ICD-10-CM

## 2010-08-06 DIAGNOSIS — Z1231 Encounter for screening mammogram for malignant neoplasm of breast: Secondary | ICD-10-CM | POA: Insufficient documentation

## 2010-08-13 ENCOUNTER — Ambulatory Visit: Payer: Self-pay | Admitting: Family Medicine

## 2010-08-13 NOTE — Assessment & Plan Note (Signed)
Summary: POSSIBLE INJECTION IN BOTH KNEES,MC   Vital Signs:  Patient profile:   50 year old female BP sitting:   148 / 94  Vitals Entered By: Lillia Pauls CMA (July 31, 2010 10:44 AM)  Primary Care Provider:  Ardyth Gal MD   History of Present Illness: 50 yo female coming in with bilateral knee pain.  Pt did have x-rays done and showed mod to severe DJD of bother knees lateral greater than medial.  Pt states the pain is all the time worse with movement, is starting to affect her ADL's states that motrin does help some and is having no side effects from medication.  States mild swelling but no numbness or loss of sensation distally. Still able to walk and bear weight.  Pt had a steroid injection in her right knee approx 2 years ago with good results Pt had a meniscal tear repair in 1999 per her report as well on the right.   Current Medications (verified): 1)  Benazepril Hcl 40 Mg Tabs (Benazepril Hcl) .... Take 1 Tablet By Mouth Once A Day For Blood Pressure and Kidney Protection 2)  Flonase 50 Mcg/act Susp (Fluticasone Propionate) .... Spray 2 Spray Into Both Nostrils Once A Day Disp 1 Mo Supply 3)  Hydrochlorothiazide 25 Mg Tabs (Hydrochlorothiazide) .Marland Kitchen.. 1 Tablet By Mouth Once A Day For Blood Pressure 4)  Metformin Hcl 1000 Mg Tabs (Metformin Hcl) .... Take 1 Tablet By Mouth Twice A Day For Diabetes 5)  Baby Aspirin 81 Mg  Chew (Aspirin) .... One Daily 6)  Glipizide 10 Mg Tabs (Glipizide) .Marland Kitchen.. 1 By Mouth Two Times A Day For Diabetes 7)  Januvia 50 Mg Tabs (Sitagliptin Phosphate) .... Take One By Mouth Daily 8)  Amitriptyline Hcl 100 Mg Tabs (Amitriptyline Hcl) .... 1/2 -1  By Mouth At Night Daily For Pain. 9)  Amlodipine Besylate 5 Mg Tabs (Amlodipine Besylate) .... One By Mouth Daily 10)  Hydrocodone-Acetaminophen 5-500 Mg Tabs (Hydrocodone-Acetaminophen) .... One Tab Q 4 Hours As Needed For Pain  Allergies (verified): No Known Drug Allergies  Past History:  Past  medical, surgical, family and social histories (including risk factors) reviewed, and no changes noted (except as noted below).  Past Medical History: Reviewed history from 12/02/2009 and no changes required. Bilateral knee arthritis, polyarthritis  DM dx`d 2004,  [id number] w/ 4 SABs between 6-8 weeks; 1 D&C,  HTN dx`d 1999,  Migraines,  Poor dentition--referred to 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 2-D echo: EF 55-65%, mild dil LV/LA, mild/mod increased LV wall thickness - 09/27/2002, Cr: 0.7 - 10/14/2004, Endometrial Bx: benign - 09/27/2002, Holter: NSR, rare PVC`s, 1 run afib w/ aberrant conduction - 09/27/2002, MRI bilat knees: degen arthritis - 04/29/2003, R knee surgery (torn meniscus) - 06/28/1997  Family History: Reviewed history from 08/25/2006 and no changes required. Breast CA - maternal aunt, Diabetes - sister and aunt, Hypertension - mother and sister  Social History: Reviewed history from 11/29/2007 and no changes required. Separated from husband. No children. Lives with sister. 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 No smoking. Occ. alcohol intake (1 glass of cognac q 3 mon). Diet primarily fried food and fast food, has increased vegetables in her diet.  Currently exercising 4 times per week for 45 minutes.    Has medical Assistance  Program.  Physical Exam  General:  Pleasant, alert, mobildly obese AA female Msk:  knees b/l all ligaments in tact, tender to palpation over medially and lateral joint line b/l   mild crepitus b/l, able to bear weight rest exam hard due to pt body habitus.  X-rays reviewed  After pt was consented with all risks and benfits addressed. pt was prepped with betadyne  antiseptic, and 1:4 kenalog 40:lidocaine was injected b/l without complications. Band aid applied over injection site.    Pulses:  R and L radial,dorsalis pedis and posterior tibial pulses are full and equal bilaterally   Impression & Recommendations:  Problem # 1:  KNEE PAIN, BILATERAL (ICD-719.46)  x rays show moderate to severe DJD, likely secondary to body habitus.  Injected today, encouraged weight loss and gave some very simple exercises to strengthen muscles around knee.  Told to try tylenol 650mg  three times a day as needed for pain but don't that if taking vicodin.  Can inject again in 3 months.   Pt body habitus, chronic disease and lack of insurence though would limit pts surgical options as well as likely simvisc. Will f/u as needed  Orders: Joint Aspirate / Injection, Large (20610) Kenalog 10 mg inj (E4540)  Problem # 2:  POLYARTHRITIS (ICD-719.49)  Problem # 3:  OBESITY NOS (ICD-278.00)  Complete Medication List: 1)  Benazepril Hcl 40 Mg Tabs (Benazepril hcl) .... Take 1 tablet by mouth once a day for blood pressure and kidney protection 2)  Flonase 50 Mcg/act Susp (Fluticasone propionate) .... Spray 2 spray into both nostrils once a day disp 1 mo supply 3)  Hydrochlorothiazide 25 Mg Tabs (Hydrochlorothiazide) .Marland Kitchen.. 1 tablet by mouth once a day for blood pressure 4)  Metformin Hcl 1000 Mg Tabs (Metformin hcl) .... Take 1 tablet by mouth twice a day for diabetes 5)  Baby Aspirin 81 Mg Chew (Aspirin) .... One daily 6)  Glipizide 10 Mg Tabs (Glipizide) .Marland Kitchen.. 1 by mouth two times a day for diabetes 7)  Januvia 50 Mg Tabs (Sitagliptin phosphate) .... Take one by mouth daily 8)  Amitriptyline Hcl 100 Mg Tabs (Amitriptyline hcl) .... 1/2 -1  by mouth at night daily for pain. 9)  Amlodipine Besylate 5 Mg Tabs (Amlodipine besylate) .... One by mouth daily 10)  Hydrocodone-acetaminophen 5-500 Mg Tabs (Hydrocodone-acetaminophen) .... One tab q 4 hours as needed for pain Joint Aspirate  / Injection, Large (20610) Kenalog 10 mg inj ([id number])   Orders Added: 1)  Est. Patient Level IV [14782] 2)  Joint Aspirate / Injection, Large [20610] 3)  Kenalog 10 mg inj [J3301]     Impression & Recommendations:  Problem # 1:  KNEE PAIN, BILATERAL (ICD-719.46)  Her updated medication list for this problem includes:    Baby Aspirin 81 Mg Chew (Aspirin) ..... One daily    Hydrocodone-acetaminophen 5-500 Mg Tabs (Hydrocodone-acetaminophen) ..... One tab q 4 hours as needed for pain    Orders: Joint Aspirate / Injection, Large (20610) Kenalog 10 mg inj (N5621)     Problem # 2:  POLYARTHRITIS (ICD-719.49) significant DJD-discussed impact of weight and DM  Problem # 3:  OBESITY NOS (ICD-278.00) discussed weight  Complete Medication List: 1)  Benazepril Hcl 40 Mg Tabs (Benazepril hcl) .... Take 1 tablet by mouth once a day for blood pressure and kidney protection 2)  Flonase 50 Mcg/act Susp (Fluticasone propionate) .... Spray 2 spray into both nostrils once a day disp 1 mo supply 3)  Hydrochlorothiazide 25 Mg Tabs (  Hydrochlorothiazide) .Marland Kitchen.. 1 tablet by mouth once a day for blood pressure 4)  Metformin Hcl 1000 Mg Tabs (Metformin hcl) .... Take 1 tablet by mouth twice a day for diabetes 5)  Baby Aspirin 81 Mg Chew (Aspirin) .... One daily 6)  Glipizide 10 Mg Tabs (Glipizide) .Marland Kitchen.. 1 by mouth two times a day for diabetes 7)  Januvia 50 Mg Tabs (Sitagliptin phosphate) .... Take one by mouth daily 8)  Amitriptyline Hcl 100 Mg Tabs (Amitriptyline hcl) .... 1/2 -1  by mouth at night daily for pain. 9)  Amlodipine Besylate 5 Mg Tabs (Amlodipine besylate) .... One by mouth daily 10)  Hydrocodone-acetaminophen 5-500 Mg Tabs (Hydrocodone-acetaminophen) .... One tab q 4 hours as needed for pain

## 2010-08-27 ENCOUNTER — Ambulatory Visit: Payer: Self-pay | Admitting: Family Medicine

## 2010-08-31 ENCOUNTER — Telehealth: Payer: Self-pay | Admitting: Family Medicine

## 2010-08-31 ENCOUNTER — Other Ambulatory Visit: Payer: Self-pay | Admitting: *Deleted

## 2010-08-31 DIAGNOSIS — I10 Essential (primary) hypertension: Secondary | ICD-10-CM

## 2010-08-31 MED ORDER — HYDROCHLOROTHIAZIDE 25 MG PO TABS: 25.0000 mg | ORAL_TABLET | Freq: Every day | ORAL | Status: DC

## 2010-08-31 NOTE — Telephone Encounter (Signed)
[facility] [address] states they have faxed in for refill on HCTZ last week.

## 2010-08-31 NOTE — Telephone Encounter (Signed)
I refilled.  Will call patient to let her know.

## 2010-09-10 ENCOUNTER — Ambulatory Visit: Payer: Self-pay | Admitting: Family Medicine

## 2010-09-10 NOTE — Progress Notes (Signed)
Medical Nutrition Therapy:  Appt start time: 1100 end time:  1200.  Assessment:  Primary concerns today: Blood sugar control and weight management Valerie Carter is usually testing FBG and occasionally during the day.  FBGs have running mid-70-100.  Valerie Carter is still getting little more than 5 hrs of sleep nightly.  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  Valerie Carter is unable to exercise currently b/c of knee pain, even in water aerobics.  In addn, finances are tight, and the next session of water aerobics at the [employer] will cost $90.  Valerie Carter has started keeping a food diary, which indicates an eating pattern of 3 meals and 1 snack on most days.  She has not always included veg's with meals, and occasionally bkfst is inadequate, i.e., 1 banana.    Progress Towards Goal(s):  In progress.   Nutritional Diagnosis:  NB-2.1 Physical inactivity As related to knee pain.  As evidenced by no current exercise. NI-1.5 Excessive energy intake As related to expenditure.  As evidenced by elevated BMI.    Intervention:  Nutrition counseling.   Monitoring/Evaluation:  Dietary intake in 2 week(s).

## 2010-09-10 NOTE — Patient Instructions (Addendum)
-   You are doing well with fruit, but please increase vegetable intake.  Aim for including veg's with both lunch and dinner.   - Also make sure each meal includes a source of protein.   - Be sure you start your day with an adequate breakfast!  Think about preparation the night before.   - Google "Lafonda Mosses."  Watch one of his videos about sugar in the [employer].   - Look into the [employer] [phone]) re. water aerobics, cost, and hours.  Also call and inquire about pool use at [facility]).  - Exercise Opportunities:  Never lie down when you can sit, never sit when you can stand, and never stand when you can pace.     - Journal with your food record:  Thoughts and feelings re. food choices.  Include incidents of food choices you are happy with as well as those you are disappointed with.

## 2010-09-24 ENCOUNTER — Ambulatory Visit: Payer: Self-pay | Admitting: Family Medicine

## 2010-10-05 ENCOUNTER — Ambulatory Visit (INDEPENDENT_AMBULATORY_CARE_PROVIDER_SITE_OTHER): Payer: Self-pay | Admitting: Family Medicine

## 2010-10-05 DIAGNOSIS — E119 Type 2 diabetes mellitus without complications: Secondary | ICD-10-CM

## 2010-10-05 NOTE — Patient Instructions (Addendum)
-   Daily use of your cycle ergometer:  10 minutes arms, 10 min legs.   - Protein foods:  Chicken:  Grilled, stir-fry, crockpot, roast @ 350 degrees with seasonings, i.e., garlic of onion powder, pepper, paprika, oregano, basil, parmesan cheese; Pork tenderloin, Fish:  Grilled/baked; Beans:  Hot or cold; mix with veg's and or starch foods; make soups; fill burritos with mashed beans mixed with spices (look for lower sodium beans).   - Starch foods:  Include a lot of veg's with any rice;   Roasted root veg's (1 small white potato, 1 large sweet potato, onion, garlic, 1 small turnip, 1 beet: Wash, cut off ends, cut into bite-size pieces.  Spray baking pan with oil, toss all veg's in pan; spray again. (Optional: add 1/3 c white wine & half as much balsamic vinegar).  If you don't add liquids, you may need to add a bit of water before they are done.  Roast at 400 degrees till soft (30-45 min).    Quinoa:  See handout.    Pasta:  Use in a stir-fry; toss cooked pasta with meat/beans and veg's (optional is sauce).   - Veg's:  Coleslaw (homemade); salads, soups, roasted veg's (asparagus, mushrooms, zucchini, onions); microwaved (in a bowl, covered with a plate, high setting, 1-2 min per serving).   - Come up with a list of at least 7 meals that are relatively easy to prepare, taste good, and are good for you.   - Eat at least 3 meals and 1-2 snacks per day.  Aim for no more than 5 hours between eating!  If you are not especially hungry, then have something light, i.e., veg's.   - AT FOLLOW-UP, WE WILL TALK ABOUT LIMITING CARB'S AT [facility]!

## 2010-10-05 NOTE — Progress Notes (Signed)
Medical Nutrition Therapy:  Appt start time: 1100 end time:  1200.  Assessment:  Primary concerns today: Blood sugar control and weight management. Valerie Carter has kept food records, but did not bring them with her today.  24-hr recall suggests an intake of 1330 kcal:  B (7 AM)- 2 egg & chs omelette, water; L- none; D (6 PM)- skinless grilled chx brst, 1 c corn & lima beans, 8 oz wine, cornbread, 1 c Riceroni; Snk (PM)- 1/2 c Edy's ice cream (190 kcal).  Valerie Carter bought a cycle arm ergometer, which she used ~3 X last week, 10 min arms & 10 min legs.  She has not signed up for water aerobics b/c of the $90 fee.    Progress Towards Goal(s):  In progress.   Nutritional Diagnosis:  NB-2.1 Physical inactivity As related to knee pain.  As evidenced by no current exercise. NI-1.5 Excessive energy intake As related to expenditure.  As evidenced by elevated BMI.    Intervention:  Nutrition counseling.   Monitoring/Evaluation:  Dietary intake in 2 week(s).

## 2010-10-19 ENCOUNTER — Ambulatory Visit (INDEPENDENT_AMBULATORY_CARE_PROVIDER_SITE_OTHER): Payer: Self-pay | Admitting: Family Medicine

## 2010-10-19 DIAGNOSIS — E119 Type 2 diabetes mellitus without complications: Secondary | ICD-10-CM

## 2010-10-19 NOTE — Patient Instructions (Signed)
-   Look for high-protein wraps some times.   - If you have peas for a meal, count that as your starch, and include a different vegetable with that meal.  - Check out the [employer] on [address] for fruits and veg's.   - Reminder:  Obtain twice as many veg's as protein or carbohydrate foods for both lunch and dinner. - Leafy greens:  Use as often you can, and ADD 1-2 leaves to your smoothie.   - Limit your carb intake to two exchanges (portion) per meal & 1 per snack. One exchange = 15 g carbohydrate = 1 slice bread, 1/2 hamburger bun, 1/3 -1/2 cup of scoopable starch foods.   - Read labels:  For every 15 grams of carbohydrate, count it as one starch food (exchange).  If a food has more than 5 grams of fiber, you can subtract those grams from the total.    - Exercise as tolerated.  On days you can't use the ergometer with your legs, do it with your arms.   - Continue keeping food and exercise records.

## 2010-10-19 NOTE — Progress Notes (Signed)
Medical Nutrition Therapy:  Appt start time: 1100 end time:  1130.  Assessment:  Primary concerns today: Blood sugar control and weight management. Valerie Carter has been keeping a daily food and exercise log, which indicates that Valerie Carter has been restricting both CHO and kcal quite a lot.  One of the things we talked about today, however, is that several meals did not include veg's or fruit.  Valerie Carter got herself a divided plate, and upon using it was surprised to see that Valerie Carter has been overeating starch and protein foods.  Valerie Carter's L knee has been very painful, so limiting to exercise ability.  Still, Valerie Carter has used her cycle ergometer a few times, as well as kettlebells for upper body exercise.   Progress Towards Goal(s):  In progress.   Nutritional Diagnosis:  NB-2.1 Physical inactivity as related to knee pain as evidenced by very limited physical activity.  NI-1.5 Excessive energy intake as related to expenditure as evidenced by elevated BMI.    Intervention:  Nutrition counseling.   Monitoring/Evaluation:  Dietary intake in 2 weeks.

## 2010-10-29 ENCOUNTER — Ambulatory Visit (INDEPENDENT_AMBULATORY_CARE_PROVIDER_SITE_OTHER): Payer: Self-pay | Admitting: Family Medicine

## 2010-10-29 ENCOUNTER — Encounter: Payer: Self-pay | Admitting: Family Medicine

## 2010-10-29 ENCOUNTER — Other Ambulatory Visit: Payer: Self-pay | Admitting: Family Medicine

## 2010-10-29 DIAGNOSIS — IMO0002 Reserved for concepts with insufficient information to code with codable children: Secondary | ICD-10-CM

## 2010-10-29 DIAGNOSIS — M179 Osteoarthritis of knee, unspecified: Secondary | ICD-10-CM | POA: Insufficient documentation

## 2010-10-29 DIAGNOSIS — E669 Obesity, unspecified: Secondary | ICD-10-CM

## 2010-10-29 DIAGNOSIS — M1710 Unilateral primary osteoarthritis, unspecified knee: Secondary | ICD-10-CM

## 2010-10-29 DIAGNOSIS — M25569 Pain in unspecified knee: Secondary | ICD-10-CM

## 2010-10-29 MED ORDER — TRAMADOL HCL 50 MG PO TABS: 50.0000 mg | ORAL_TABLET | Freq: Four times a day (QID) | ORAL | Status: DC | PRN

## 2010-10-29 NOTE — Progress Notes (Signed)
  Subjective:    Patient ID: Valerie Carter, female    DOB: 1960-08-10, 50 y.o.   MRN: [id number]  HPI 50 year old female to office for evaluation of bilateral knee pain. Has been ongoing for several months. Pain currently worse in the left compared to the right. She has noted increasing pain with activity as well as increasing stiffness in the knees. Has intermittent swelling. No catching, locking, instability. Currently taking Aleve up to 6 tabs a day with limited improvement. Is not taking any other medications for this at this time. Denies any history of steroid injection. Interested in steroid injections in both knees today. Patient is employed as a Surveyor, mining and notes increased pain with this activity as well.    X-rays from 07/25/10 of both knees non-standing showed left knee with mild to moderate tricompartmental changes, most notable changes in the lateral compartment. Right knee with moderate to severe tricompartmental changes with most severe changes noted in the lateral compartment. Images personally reviewed during the visit today   Review of Systems Per history of present illness otherwise negative    Objective:   Physical Exam GENERAL: Alert and oriented x3, in no acute distress, obese SKIN: No rashes or lesions RESPIRATORY: Respirations 15 and unlabored MSK: - Knees: Right knee with range of motion 0-115 with mild pain and 1+ crepitation. She has mild medial joint line tenderness, no lateral joint line tenderness. 1+ synovitis, no effusion.  Negative McMurray's and no ligamentous laxity. Left knee with range of motion 0-115 with mild pain and 1-2+ crepitation. Trace effusion and 1+ of ideas. Tender to palpation along medial and lateral joint lines. Mild lateral knee pain with McMurray's. No ligamentous laxity. - Gait: Increased genu valgum. Walking with antalgic gait - Feet: Bilateral pes planus with overpronation and hindfoot valgus Neurovascularly intact distally        Assessment & Plan:

## 2010-10-29 NOTE — Telephone Encounter (Signed)
Message left on voicemail to call for appointment  before next refill needed.

## 2010-10-29 NOTE — Assessment & Plan Note (Signed)
Likely contributing to her knee pain. Emphasized weight loss to help with the pain

## 2010-10-29 NOTE — Assessment & Plan Note (Addendum)
>>  ASSESSMENT AND PLAN FOR KNEE PAIN, BILATERAL WRITTEN ON 10/29/2010  1:46 PM BY Vegas Coffin C, MD  - Bilateral knee pain left greater than right - Underwent bilateral intra-articular corticosteroid injection in the office today PROCEDURE NOTE Consent obtained and verified. Area cleansed with alcohol. Topical analgesic spray: Ethyl chloride. Joint: Left and right knees Approached in typical fashion with: Anterior lateral approach  Completed without difficulty Meds:  4 cc 1% lidocaine  +1 cc Kenalog 40 mg/cc injected into both knees  Needle:  21-gauge 1.5 inch  Aftercare instructions and Red flags advised. Tolerated procedure well without any complications. Caution steroids may transiently elevate her blood sugars over the next several days. - Rx for tramadol  given to use on an as-needed basis. 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 - Based on review of x-rays in the office today, patient may be candidate for knee replacement in the future. - Follow up with us  on an as-needed basis    >>ASSESSMENT AND PLAN FOR ARTHRITIS OF KNEE, DEGENERATIVE WRITTEN ON 10/29/2010  1:47 PM BY Nickholas Goldston C, MD  Based on x-rays from 07/25/10 which were done standing has mild-to-moderate tricompartmental DJD on the left and moderate to severe tricompartmental disease on the right

## 2010-10-29 NOTE — Assessment & Plan Note (Signed)
Based on x-rays from 07/25/10 which were done standing has mild-to-moderate tricompartmental DJD on the left and moderate to severe tricompartmental disease on the right

## 2010-10-29 NOTE — Progress Notes (Signed)
  Subjective:    Patient ID: Valerie Carter, female    DOB: 09/19/1960, 50 y.o.   MRN: [id number]  HPI Patient is here today with a chief complaint of bilateral knee pain. In the past, the patient states that her right knee has been worse than the left but today the left is worse. Patient is requesting both knees to be injected due to the pain that she's having.   Review of Systems     Objective:   Physical Exam        Assessment & Plan:

## 2010-11-02 ENCOUNTER — Ambulatory Visit (INDEPENDENT_AMBULATORY_CARE_PROVIDER_SITE_OTHER): Payer: Self-pay | Admitting: Family Medicine

## 2010-11-02 DIAGNOSIS — E119 Type 2 diabetes mellitus without complications: Secondary | ICD-10-CM

## 2010-11-02 NOTE — Progress Notes (Signed)
Medical Nutrition Therapy:  Appt start time: 1030 end time:  1100.  Assessment:  Primary concerns today: Blood sugar control and weight management. Valerie Carter has been eating less, partly from feeling down following a friend's death last month.  She has been using the cycle ergometer with her arms for 10 min 2 X wk.  She is also doing some leg lifts and a few other exercises that don't involve her L knee.  Valerie Carter got a dx of torn meniscus of her L knee.  Surgery is indicated, but requires at least 75 lb wt loss first.  FBG have been 78-100 most days.  Valerie Carter has been incorporating more veg's to her diet, and she has been limiting starchy foods to 2 per meal, as recommended.    Progress Towards Goal(s):  Some progress.   Nutritional Diagnosis:  NB-2.1 Progress noted on physical inactivity as related to knee pain as evidenced by daily (although limited) physical activity.  NI-1.5 Progress noted on excessive energy intake as related to expenditure as evidenced by self-report of increased vegetable intake and decreased intake of starchy foods.    Intervention:  Nutrition counseling.   Monitoring/Evaluation:  Dietary intake in 2 weeks.

## 2010-11-02 NOTE — Patient Instructions (Signed)
-   A wt loss of 75 lb (allowing knee surgery) IS possible, but will require tremendous commitment on your part for good food choices, AND any physical activity you can do will help too.  Exercise of any kind is likely to help you build or maintain muscle, which is the more active tissue in terms of burning calories.  As you lose weight, you may feel less pressure on that knee.   - Google Lafonda Mosses, and listen to one of his videos.   - [facility] [employer] on [employer].   - Remember to try some leafy greens in your smoothies.   - Continue making veg's a major part of your diet.   - Daily food record; bring that to next appt.

## 2010-11-10 ENCOUNTER — Other Ambulatory Visit: Payer: Self-pay | Admitting: *Deleted

## 2010-11-10 DIAGNOSIS — I10 Essential (primary) hypertension: Secondary | ICD-10-CM

## 2010-11-10 DIAGNOSIS — J302 Other seasonal allergic rhinitis: Secondary | ICD-10-CM

## 2010-11-10 MED ORDER — GLIPIZIDE 10 MG PO TABS: 10.0000 mg | ORAL_TABLET | Freq: Two times a day (BID) | ORAL | Status: DC

## 2010-11-10 MED ORDER — FLUTICASONE PROPIONATE 50 MCG/ACT NA SUSP: 2.0000 | Freq: Every day | NASAL | Status: DC

## 2010-11-10 MED ORDER — AMLODIPINE BESYLATE 5 MG PO TABS: 5.0000 mg | ORAL_TABLET | Freq: Every day | ORAL | Status: DC

## 2010-11-17 ENCOUNTER — Ambulatory Visit: Payer: Self-pay | Admitting: Family Medicine

## 2010-11-30 ENCOUNTER — Ambulatory Visit: Payer: Self-pay | Admitting: Family Medicine

## 2010-12-07 ENCOUNTER — Ambulatory Visit (INDEPENDENT_AMBULATORY_CARE_PROVIDER_SITE_OTHER): Payer: Self-pay | Admitting: Family Medicine

## 2010-12-07 ENCOUNTER — Encounter: Payer: Self-pay | Admitting: Family Medicine

## 2010-12-07 DIAGNOSIS — IMO0002 Reserved for concepts with insufficient information to code with codable children: Secondary | ICD-10-CM

## 2010-12-07 DIAGNOSIS — E119 Type 2 diabetes mellitus without complications: Secondary | ICD-10-CM

## 2010-12-07 DIAGNOSIS — M1710 Unilateral primary osteoarthritis, unspecified knee: Secondary | ICD-10-CM

## 2010-12-07 DIAGNOSIS — D509 Iron deficiency anemia, unspecified: Secondary | ICD-10-CM

## 2010-12-07 DIAGNOSIS — M179 Osteoarthritis of knee, unspecified: Secondary | ICD-10-CM

## 2010-12-07 DIAGNOSIS — I10 Essential (primary) hypertension: Secondary | ICD-10-CM

## 2010-12-07 LAB — CBC
HCT: 37.4 % (ref 36.0–46.0)
Hemoglobin: 11.2 g/dL — ABNORMAL LOW (ref 12.0–15.0)
RBC: 4.87 MIL/uL (ref 3.87–5.11)

## 2010-12-07 LAB — POCT GLYCOSYLATED HEMOGLOBIN (HGB A1C): Hemoglobin A1C: 6.9

## 2010-12-07 MED ORDER — FERROUS SULFATE 300 (60 FE) MG/5ML PO SYRP: 300.0000 mg | ORAL_SOLUTION | Freq: Two times a day (BID) | ORAL | Status: DC

## 2010-12-07 MED ORDER — HYDROCODONE-ACETAMINOPHEN 5-500 MG PO TABS: 1.0000 | ORAL_TABLET | Freq: Four times a day (QID) | ORAL | Status: DC | PRN

## 2010-12-07 MED ORDER — BENAZEPRIL HCL 40 MG PO TABS: 40.0000 mg | ORAL_TABLET | Freq: Every day | ORAL | Status: DC

## 2010-12-07 NOTE — Assessment & Plan Note (Signed)
Well controlled, no medication changes.  Will refill medicines today.

## 2010-12-07 NOTE — Patient Instructions (Signed)
-   Reminders from last appt:    1. Google Lafonda Mosses.  Tourist information centre manager?)  2. [employer] on [employer]  3. Try 1-2 leaves of greens in your smoothies - Pools:  Call to inquire re. water exercise at [facility] ([facility]) and at [employer] [phone]).  Ask about times when you and your niece could both go.   - Continue lots of veg's. - DAILY FOOD RECORDS.   - Make a list of at least 7 meals that (a) taste good; (b) meet your nutritional needs; & (c) are easy to prepare.  Meeting nutritional needs means including veg's and protein with your lunch or dinner.  (Protein sources:  Dairy, meat, fish, poultry, beans, eggs.) - Email Jeannie your meals list:  [email or web address].

## 2010-12-07 NOTE — Assessment & Plan Note (Signed)
Will continue Vicodin QHS.  Pt encouraged to move as much as possible as losing the weight for joint replacement may help her.  Reviewed that it is OK to take tylenol with vicodin if she keeps up with how much, 4,000 mg being a max dose for tylenol from any source.

## 2010-12-07 NOTE — Assessment & Plan Note (Signed)
>>  ASSESSMENT AND PLAN FOR ARTHRITIS OF KNEE, DEGENERATIVE WRITTEN ON 12/07/2010  8:28 PM BY CHAMBERLAIN, RACHEL, MD  Will continue Vicodin QHS.  Pt encouraged to move as much as possible as losing the weight for joint replacement may help her.  Reviewed that it is OK to take tylenol  with vicodin if she keeps up with how much, 4,000 mg being a max dose for tylenol  from any source.

## 2010-12-07 NOTE — Assessment & Plan Note (Signed)
Well controlled, pt has self discontinued [place] due to side effects.  Will not re-start due to good control.  Continue Glipizide and metformin.

## 2010-12-07 NOTE — Patient Instructions (Signed)
It was good to see you.  I am glad you are making such an effort to take good care of yourself.  Your blood pressure today was BP: 120/80 mmHg.  Remember your goal blood pressure is about 120/80.  Please be sure to take your medication every day.    Your Hemoglobin A1C is  Lab Results  Component Value Date   HGBA1C 6.9 12/07/2010  .  Remember your goal for A1C is less than 7.  Your goal for fasting morning blood sugar is 80-120.    Keep up the good work, and keep trying to eat healthy and move your body as much as possible.

## 2010-12-07 NOTE — Assessment & Plan Note (Signed)
Pt with iron deficiency anemia in past, will Rx Iron, but will also recheck CBC today for baseline.

## 2010-12-07 NOTE — Progress Notes (Signed)
Medical Nutrition Therapy:  Appt start time: 1030 end time:  1100.  Assessment:  Primary concerns today: Blood sugar control and weight management. Valerie Carter has not kept food records, nor has she managed to do many of the other recommendations from her last appt.  She is now out of work for the summer, however, so thinks she can really focus on what she needs to do for wt loss.  Her knees are still very painful, so she hopes to explore water exercise options.  She has been eating more veg's.    Progress Towards Goal(s):  Some progress.   Nutritional Diagnosis:  NB-2.1 Stable progress on physical inactivity as related to knee pain as evidenced by daily (although limited) physical activity.  NI-1.5 Stable progress noted on excessive energy intake as related to expenditure as evidenced by self-report of increased vegetable intake and decreased intake of starchy foods.    Intervention:  Nutrition counseling.   Monitoring/Evaluation:  Dietary intake, body weight, and physical activity in 2 weeks.

## 2010-12-07 NOTE — Progress Notes (Signed)
  Subjective:    Patient ID: Valerie Carter, female    DOB: 05-21-1961, 50 y.o.   MRN: [id number]  HPI Valerie Carter returns for follow up.  She reports she has been taking her blood pressure medications without problem, but she does need a refill of benazapril.  She reports no headaches, vision changes, shortness of breath, or chest pain.  She says her blood sugars are usually 80-100 in the morning.  She has not had any low sugars.  She does confess that she is not taking the Januvia, and that she does not feel she needs it.    She is still having significant knee pain throughout the day and at night while trying to sleep.  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  She has an arm peddle machine to exercise with because even water aerobics hurt her knees.  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  She says she wants the knee replacement surgery, but she was told she needs to lose 75 lbs.  She wants to try to do this.    Patient asks if she should be taking iron as she knows her blood counts have been low in the past.  She feels her energy levels are low and is wondering if iron would help.    Review of Systems Negative except stated in HPI. .    Objective:   Physical Exam BP 120/80  Pulse 88 General appearance: alert, cooperative, no distress and morbidly obese Eyes: conjunctivae/corneas clear. PERRL, EOM's intact. Fundi benign. Throat: lips, mucosa, and tongue normal; teeth and gums normal Neck: no adenopathy, no carotid bruit, no JVD, supple, symmetrical, trachea midline and thyroid not enlarged, symmetric, no tenderness/mass/nodules Lungs: clear to auscultation bilaterally Heart: regular rate and rhythm, S1, S2 normal, no murmur, click, rub or gallop Extremities: extremities normal, atraumatic, no cyanosis or edema Pulses: 2+ and  symmetric   MSK: patient with + crepitus in knees bilaterally.  Pain with palpation of medial and lateral joint lines bilaterally.  Exam difficult due to body habitus.     Assessment & Plan:

## 2010-12-10 ENCOUNTER — Other Ambulatory Visit: Payer: Self-pay | Admitting: Family Medicine

## 2010-12-10 ENCOUNTER — Encounter: Payer: Self-pay | Admitting: Family Medicine

## 2010-12-10 DIAGNOSIS — Z1211 Encounter for screening for malignant neoplasm of colon: Secondary | ICD-10-CM

## 2010-12-14 ENCOUNTER — Encounter: Payer: Self-pay | Admitting: Family Medicine

## 2010-12-18 ENCOUNTER — Other Ambulatory Visit: Payer: Self-pay | Admitting: Family Medicine

## 2010-12-18 DIAGNOSIS — D509 Iron deficiency anemia, unspecified: Secondary | ICD-10-CM

## 2010-12-18 MED ORDER — FERROUS SULFATE 325 (65 FE) MG PO TABS: 325.0000 mg | ORAL_TABLET | Freq: Two times a day (BID) | ORAL | Status: DC

## 2010-12-22 ENCOUNTER — Ambulatory Visit: Payer: Self-pay | Admitting: Family Medicine

## 2010-12-28 ENCOUNTER — Ambulatory Visit (INDEPENDENT_AMBULATORY_CARE_PROVIDER_SITE_OTHER): Payer: Self-pay | Admitting: Family Medicine

## 2010-12-28 DIAGNOSIS — E119 Type 2 diabetes mellitus without complications: Secondary | ICD-10-CM

## 2010-12-28 NOTE — Patient Instructions (Addendum)
-   Goal:  Set a timer for every 30 minutes to trigger physical activity.  This may be any moving of at least 1 minute.  This may be used for crocheting, TV, or reading, or _______________.   - Come up with a list of other alternatives to TV for your summer.   - Exercises with weights and arm cycle ergometer:  EVERY DAY.   - Record daily exercise, and call Jeannie every Friday to report # days of exercise that week:  [phone].  - Food goals:    1. Eat at least 3 meals (including breakfast!) and 1-2 snacks per day.  Aim for no more than 5       hours between eating.  2. Vegetables at lunch and dinner.  3. PLAN AHEAD at least one day at a time.

## 2010-12-28 NOTE — Progress Notes (Signed)
Medical Nutrition Therapy:  Appt start time: 1000 end time:  1030.  Assessment:  Primary concerns today: Blood sugar control and weight management. Arora has become discouraged about her ability to lose weight.  Financial constraints make it hard to keep on hand fruits and veg's, and her knee pain makes exercise challenging.  FBG has been well controlled, however; high-90s to low 100s, probably mainly b/c food intake is minimal, according to food records.  Food records also reveal fast food meals several times a week, which I pointed out may sound inexpensive, but are sometimes more expensive than healthier foods prepared at home.  We also talked about why skipping meals does not work for weight loss, which Adriella has experienced herself.    Progress Towards Goal(s):  Some progress.   Nutritional Diagnosis:  NB-2.1 No progress on physical inactivity as related to knee pain as evidenced by rare physical activity recently.  NI-1.5 Stable progress noted on excessive energy intake as related to expenditure as evidenced by food records showing intake of only [phone] kcal/day.    Intervention:  Nutrition counseling.   Monitoring/Evaluation:  Dietary intake, body weight, and physical activity in 2 weeks.

## 2011-01-01 ENCOUNTER — Telehealth: Payer: Self-pay | Admitting: Family Medicine

## 2011-01-01 NOTE — Telephone Encounter (Signed)
Thinks she has a UTI and wants to know if there is something over the counter that she can take.  She is on her cycle and would rather wait to be seen if at all possible.

## 2011-01-01 NOTE — Telephone Encounter (Signed)
Told patient there was nothing OTC for UTIs.  Has been having sx for 2 days now (dysuria).  Advised her to drink as much water as possible to flush the bladder or if she was too uncomfortable we would try to work her in for a UA.  Patient will try pushing fluids first.

## 2011-01-11 ENCOUNTER — Ambulatory Visit (INDEPENDENT_AMBULATORY_CARE_PROVIDER_SITE_OTHER): Payer: Self-pay | Admitting: Family Medicine

## 2011-01-11 ENCOUNTER — Encounter: Payer: Self-pay | Admitting: Family Medicine

## 2011-01-11 VITALS — BP 142/81 | HR 91 | Temp 98.3°F | Ht 65.0 in | Wt 365.5 lb

## 2011-01-11 DIAGNOSIS — E119 Type 2 diabetes mellitus without complications: Secondary | ICD-10-CM

## 2011-01-11 DIAGNOSIS — R300 Dysuria: Secondary | ICD-10-CM

## 2011-01-11 LAB — POCT UA - MICROSCOPIC ONLY

## 2011-01-11 LAB — POCT URINALYSIS DIPSTICK
Bilirubin, UA: NEGATIVE
Ketones, UA: NEGATIVE
Leukocytes, UA: NEGATIVE
Spec Grav, UA: 1.025

## 2011-01-11 NOTE — Patient Instructions (Signed)
I will send your urine for culture to check for bacteria Try reducing fragrant soaps, using plain dove Try monistat cream to help if yeast may be a problem If you continue to have burning, come back for recheck Will call you at [phone] if you culture shows bacterial growth

## 2011-01-11 NOTE — Progress Notes (Signed)
Medical Nutrition Therapy:  Appt start time: 1000 end time:  1030.  Assessment:  Primary concerns today: Blood sugar control and weight management. Valerie Carter has been "chair dancing" a few minutes several times a day (40 min 1st wk; 75 min 2nd week), and has made an effort to be more active throughout the day.  Valerie Carter has been journaling, and making good food choices.  Valerie Carter has been planning ahead, getting veg's 2 X day on most days, and Valerie Carter has usually been getting 3 meals a day.    Progress Towards Goal(s):  Some progress.   Nutritional Diagnosis:  NB-2.1 Good progress on physical inactivity as related to knee pain as evidenced by several minutes of exercise on most days of the week.  NI-1.5 Stable progress noted on excessive energy intake as related to expenditure as evidenced by weight loss of 5 lb in 2 wks.    Intervention:  Nutrition counseling.   Monitoring/Evaluation:  Dietary intake, body weight, and physical activity in 2 weeks.

## 2011-01-11 NOTE — Progress Notes (Signed)
  Subjective:    Patient ID: Valerie Carter, female    DOB: [birth year], 50 y.o.   MRN: [id number]  HPI 2 weeks of dysuria.  Perhaps some urinary frequency but difficult to distinguish from increase water intake.  NO abdominal/pelvic pain.  Has some back pain she is not sure is related.  Has chronic back pain, worse with movements.  No fever, chills, nausea.  Has noticed some blood when wiping but not sure if due to irregular menses.  Notes a recent change in soap to irish spring. No vaginal discharge   Review of Systems see HP     Objective:   Physical Exam GEN: Alert & Oriented, No acute distress, obese CV:  Regular Rate & Rhythm, no murmur Respiratory:  Normal work of breathing, CTAB Abd:  + BS, soft, no tenderness to palpation.  No flank tenderness. Ext: no pre-tibial edema She declines pelvic exam       Assessment & Plan:

## 2011-01-11 NOTE — Patient Instructions (Addendum)
-   Physical activity:  Increase # of minutes of activity per day, and get an additional day of exercise during the week.   - Continue to journal, and to:  - Plan ahead for meals AND for exercise  - Get 3 meals a day  - Get veg's at least 2 X day  - Get at least one fruit a day (fresh is best)

## 2011-01-11 NOTE — Assessment & Plan Note (Signed)
U/A shows some small blood with minimal WBC's.  Symptoms and labwork not diagnostic for UTI, discussed with patient may also be dermatitis from new soap or yeast.  She declines pelvic exam.    Will culture urine, advised to avoid scented soaps (use dove) and may try OTC monistat.  If continues to have dysuria, asked her to return for exam.

## 2011-01-13 ENCOUNTER — Encounter: Payer: Self-pay | Admitting: Family Medicine

## 2011-01-13 LAB — URINE CULTURE: Colony Count: 15000

## 2011-01-25 ENCOUNTER — Ambulatory Visit: Payer: Self-pay | Admitting: Family Medicine

## 2011-02-02 ENCOUNTER — Ambulatory Visit: Payer: Self-pay | Admitting: Family Medicine

## 2011-02-08 ENCOUNTER — Ambulatory Visit: Payer: Self-pay | Admitting: Family Medicine

## 2011-02-15 ENCOUNTER — Ambulatory Visit (INDEPENDENT_AMBULATORY_CARE_PROVIDER_SITE_OTHER): Payer: Self-pay | Admitting: Family Medicine

## 2011-02-15 ENCOUNTER — Encounter: Payer: Self-pay | Admitting: Family Medicine

## 2011-02-15 VITALS — BP 158/104 | HR 109

## 2011-02-15 DIAGNOSIS — IMO0002 Reserved for concepts with insufficient information to code with codable children: Secondary | ICD-10-CM

## 2011-02-15 DIAGNOSIS — M179 Osteoarthritis of knee, unspecified: Secondary | ICD-10-CM

## 2011-02-15 DIAGNOSIS — M1710 Unilateral primary osteoarthritis, unspecified knee: Secondary | ICD-10-CM

## 2011-02-15 MED ORDER — HYDROCODONE-ACETAMINOPHEN 5-500 MG PO TABS: ORAL_TABLET | ORAL | Status: DC

## 2011-02-15 NOTE — Progress Notes (Signed)
  Subjective:    Patient ID: Valerie Carter, female    DOB: [birth year], 50 y.o.   MRN: [id number]  HPI  Worsening knee pain. Her last injections for greater than 3 months ago. She had relief for approximately 2 months. Since then she's been using up to 6 Naprosyn a day. She is out of her Vicodin. She's had no new injury. Pain is constant 8/10 with a lot of walking or standing. She is having nighttime awakening from pain. She has some popping but no locking or giving way.  Review of Systems Denies fever, denies unusual weight change. No leg numbness.    Objective:   Physical Exam  GENERAL: Well-developed obese female no acute distress KNEES: Medial joint line tenderness bilaterally. No effusion. No erythema or warmth. She is ligamentously intact to varus and valgus stress and normal Lachman. She does have some mild crepitus bilaterally under the kneecap.  INJECTION: Patient was given informed consent, signed copy in the chart. Appropriate time out was taken. Area prepped and draped in usual sterile fashion. One cc of kenalog plus  4 cc of lidocaine was injected into the bilateral knee using a(n) anterior approach. The patient tolerated the procedure well. There were no complications. Post procedure instructions were given.       Assessment & Plan:  Severe degenerative arthritis bilateral knees. Bilateral corticosteroid injections today. I refilled her Vicodin for 5 months. She is only to take this at night. She is to only take for Naprosyn a day, that is over-the-counter Naprosyn. 2 return to clinic 3 months for followup. At next visit I will probably check her creatinine.

## 2011-02-19 ENCOUNTER — Other Ambulatory Visit: Payer: Self-pay | Admitting: Family Medicine

## 2011-02-19 NOTE — Telephone Encounter (Signed)
Refill request

## 2011-02-24 ENCOUNTER — Other Ambulatory Visit: Payer: Self-pay | Admitting: Family Medicine

## 2011-02-24 MED ORDER — HYDROCHLOROTHIAZIDE 25 MG PO TABS: 25.0000 mg | ORAL_TABLET | Freq: Every day | ORAL | Status: DC

## 2011-03-04 ENCOUNTER — Ambulatory Visit (INDEPENDENT_AMBULATORY_CARE_PROVIDER_SITE_OTHER): Payer: Self-pay | Admitting: Family Medicine

## 2011-03-04 DIAGNOSIS — E119 Type 2 diabetes mellitus without complications: Secondary | ICD-10-CM

## 2011-03-04 NOTE — Patient Instructions (Addendum)
-   Check the sodium content of the dressing you use.   - Aim for getting no more than 1500 mg of sodium in a day.   - Watermelon:  Limit intake to ONE cup at a time, no more than 3 X day.  (Cantaloupe:  2 cups.) - Your new food protocol:  1. 3 meals and 1-2 snacks per day.  2. Protein with each meal.   3. Limit protein foods to LEAN meat/fish/beans only, i.e., any fish that is not fried; chicken,      [place], beef that is <5 g fat per ounce, pork tenderloin.  4. Vegetables with both lunch and dinner.   5. WHOLE, REAL FOOD.  Be cautious about condiments (fat, sodium).    6. FOOD RECORDS DAILY.  Include details (how much, description, what time).    7. Email Jeannie at least once a week (Thursdays).    - Google PALEO diet and PRIMAL diet.

## 2011-03-04 NOTE — Progress Notes (Signed)
Medical Nutrition Therapy:  Appt start time: 1000 end time:  1030.  Assessment:  Primary concerns today: Blood sugar control and weight management. Donnarae is increasingly frustrated at her inability to lose weight, even though she has made great progress in managing her BG.  I am convinced that she is really trying hard to make good food choices, but her knee pain has precluded any meaningful exercise.  She is ineligible for Medicaid assistance, but her income is inadequate to purchase health insurance, and she can't afford bariatric surgery.  24-hr recall suggests intake of <900 kcal: B (6:15 AM)- coffee w/ flavored creamer & 2 tsp sugar, yogurt (130 kcal); L (12 PM)- salad w/ low-fat dressing, 1 oz cheese & olives; Snk ( PM)- 15 grapes; D (7 PM)- 2 hotdogs, plain, potatoes (onion soup mix), salad, wine.  Progress Towards Goal(s):  Some progress.   Nutritional Diagnosis:  NB-2.1 Regression on physical inactivity as related to knee pain as evidenced by difficulty walking and no exercise in recent weeks.  NI-1.5 Stable progress noted on excessive energy intake as related to expenditure as evidenced by 24-hr recall indicating intake of <900 kcal.     Intervention:  Nutrition counseling.   Monitoring/Evaluation:  Dietary intake, body weight, and physical activity in 2 weeks.

## 2011-03-26 LAB — URINALYSIS, ROUTINE W REFLEX MICROSCOPIC
Bilirubin Urine: NEGATIVE
Glucose, UA: NEGATIVE
pH: 6

## 2011-03-26 LAB — BASIC METABOLIC PANEL
BUN: 9
CO2: 25
Chloride: 103
Creatinine, Ser: 0.89

## 2011-03-26 LAB — URINE MICROSCOPIC-ADD ON

## 2011-03-26 LAB — URINE CULTURE: Colony Count: >=100000

## 2011-03-26 LAB — HEMOGLOBIN AND HEMATOCRIT, BLOOD: HCT: 32.8 — ABNORMAL LOW

## 2011-04-01 ENCOUNTER — Ambulatory Visit: Payer: Self-pay | Admitting: Family Medicine

## 2011-04-04 ENCOUNTER — Emergency Department (HOSPITAL_COMMUNITY): Payer: Self-pay

## 2011-04-04 ENCOUNTER — Emergency Department (HOSPITAL_COMMUNITY)
Admission: EM | Admit: 2011-04-04 | Discharge: 2011-04-04 | Disposition: A | Discharge status: Home / Self Care | Payer: Self-pay | Attending: Emergency Medicine | Admitting: Emergency Medicine

## 2011-04-04 ENCOUNTER — Inpatient Hospital Stay (INDEPENDENT_AMBULATORY_CARE_PROVIDER_SITE_OTHER)
Admission: RE | Admit: 2011-04-04 | Discharge: 2011-04-04 | Disposition: A | Discharge status: Home / Self Care | Payer: Self-pay | Source: Ambulatory Visit | Attending: Emergency Medicine | Admitting: Emergency Medicine

## 2011-04-04 DIAGNOSIS — Z79899 Other long term (current) drug therapy: Secondary | ICD-10-CM | POA: Insufficient documentation

## 2011-04-04 DIAGNOSIS — I10 Essential (primary) hypertension: Secondary | ICD-10-CM | POA: Insufficient documentation

## 2011-04-04 DIAGNOSIS — E119 Type 2 diabetes mellitus without complications: Secondary | ICD-10-CM | POA: Insufficient documentation

## 2011-04-04 DIAGNOSIS — E7800 Pure hypercholesterolemia, unspecified: Secondary | ICD-10-CM | POA: Insufficient documentation

## 2011-04-04 DIAGNOSIS — R002 Palpitations: Secondary | ICD-10-CM

## 2011-04-04 LAB — POCT I-STAT, CHEM 8
BUN: 12 mg/dL (ref 6–23)
Chloride: 110 mEq/L (ref 96–112)
Sodium: 137 mEq/L (ref 135–145)

## 2011-04-08 ENCOUNTER — Ambulatory Visit (INDEPENDENT_AMBULATORY_CARE_PROVIDER_SITE_OTHER): Payer: Self-pay | Admitting: Family Medicine

## 2011-04-08 ENCOUNTER — Encounter: Payer: Self-pay | Admitting: Family Medicine

## 2011-04-08 VITALS — BP 146/77 | HR 106 | Temp 98.0°F | Ht 65.0 in | Wt 370.2 lb

## 2011-04-08 DIAGNOSIS — E119 Type 2 diabetes mellitus without complications: Secondary | ICD-10-CM

## 2011-04-08 DIAGNOSIS — I10 Essential (primary) hypertension: Secondary | ICD-10-CM

## 2011-04-08 DIAGNOSIS — E669 Obesity, unspecified: Secondary | ICD-10-CM

## 2011-04-08 DIAGNOSIS — M1710 Unilateral primary osteoarthritis, unspecified knee: Secondary | ICD-10-CM

## 2011-04-08 DIAGNOSIS — M179 Osteoarthritis of knee, unspecified: Secondary | ICD-10-CM

## 2011-04-08 DIAGNOSIS — IMO0002 Reserved for concepts with insufficient information to code with codable children: Secondary | ICD-10-CM

## 2011-04-08 LAB — POCT GLYCOSYLATED HEMOGLOBIN (HGB A1C): Hemoglobin A1C: 6.5

## 2011-04-08 MED ORDER — KETOPROFEN POWD: 1.0000 "application " | Freq: Two times a day (BID) | Status: DC

## 2011-04-08 MED ORDER — DICLOFENAC SODIUM 1 % TD GEL: 1.0000 "application " | Freq: Four times a day (QID) | TRANSDERMAL | Status: DC | PRN

## 2011-04-08 NOTE — Assessment & Plan Note (Signed)
Well controlled on oral medications.  Continue nutrition counseling and current meds.

## 2011-04-08 NOTE — Progress Notes (Signed)
Medical Nutrition Therapy:  Appt start time: 1030 end time:  1100.  Assessment:  Primary concerns today: Blood sugar control and weight management. Oluwatoni has joined an Therapist, sports group called, "[employer]," which recommends eating a lot of veg's, no meat, and 5 hours of exercise per week.  24-hr recall suggests intake of well under 800 kcal (which Lorriann said is pretty typical of recent days): B (5:30 AM)- 2 eggs & cheese omelette, coffee w/ flavored creamer &1 tbsp sugar; Snk (10:30 AM)- apple; L (12:30 PM)- salad w/ ranch dressing; D ( PM)- 2 c okra, water. Typical day:  Up at 4:30; to work @ 6 AM; back by 9:30; to work @ 1:30; home @ 5:30; bedtime at 9:30 (or more often 10).  Jossalyn sleeps much more on weekends, when she can.    Progress Towards Goal(s):  Some progress.   Nutritional Diagnosis:  NB-2.1 No progress on physical inactivity as related to knee pain as evidenced by difficulty walking and no exercise in recent weeks.  NI-1.5 Stable progress noted on excessive energy intake as related to expenditure as evidenced by 24-hr recall indicating intake of <800 kcal.     Intervention:  Nutrition counseling.   Monitoring/Evaluation:  Dietary intake, body weight, and physical activity in 2 weeks.

## 2011-04-08 NOTE — Assessment & Plan Note (Signed)
Slightly elevated today but patient is in pain.  Will continue to monitor, consider increasing if this persists.

## 2011-04-08 NOTE — Assessment & Plan Note (Signed)
>>  ASSESSMENT AND PLAN FOR ARTHRITIS OF KNEE, DEGENERATIVE WRITTEN ON 04/08/2011 12:11 PM BY CHAMBERLAIN, RACHEL, MD  Pt is still having significant pain, cannot take narcotics during day time.  She is not sure she can afford a topical NSAID, but I gave Rx for both ketoprofen  and voltaren  to see which is cheaper.  Advised she can only use one of them.  Continue vicodin at bedtime and weight management. I gave her knee/leg strengthening exercises and went over a few of them with her.  I advised this may help stabilize her knee and take pressure off the joint itself, and doing them will her burn a few more calories each day.

## 2011-04-08 NOTE — Patient Instructions (Signed)
It was good to see you today.  Your Hemoglobin A1C is  Lab Results  Component Value Date   HGBA1C 6.5 04/08/2011  .  Remember your goal for A1C is less than 7.  Your goal for fasting morning blood sugar is 80-120.   Your blood pressure today was BP: 146/77 mmHg.  Remember your goal blood pressure is about 120/80.  Please be sure to take your medication every day.      Please try doing these knee exercises, and start using your arm bike again.    Please come back and see me in 3-6 months or sooner if needed.

## 2011-04-08 NOTE — Assessment & Plan Note (Addendum)
Pt has gained some weight recently.  She is trying to make lifestyle changes, I continued to encourage her.  We discussed using the arm bike to increase the calories she burns.  Also, gave exercises to strengthen muscles around knee, which may help some with knee pain, allowing her to exercise (walk) more.

## 2011-04-08 NOTE — Assessment & Plan Note (Addendum)
Pt is still having significant pain, cannot take narcotics during day time.  She is not sure she can afford a topical NSAID, but I gave Rx for both ketoprofen and voltaren to see which is cheaper.  Advised she can only use one of them.  Continue vicodin at bedtime and weight management. I gave her knee/leg strengthening exercises and went over a few of them with her.  I advised this may help stabilize her knee and take pressure off the joint itself, and doing them will her burn a few more calories each day.

## 2011-04-08 NOTE — Progress Notes (Signed)
  Subjective:    Patient ID: RAYCHELL HOLCOMB, female    DOB: [birth year], 50 y.o.   MRN: [id number]  HPI  Ms. Menton presents for followup. 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  Diabetes-patient reports good blood sugar control, the fasting sugars ranging 80-120. She is taking her metformin and glipizide without difficulty. She reports no hypoglycemic episodes.  Hypertension-patient is taking her Norvasc, benazepril, and HCTZ without side effects.  She denies any palpitations, chest pain, dyspnea, or lower extremity swelling.   Obesity- pt is very frustrated about her weight. She says she has joined a group of black women who are trying to lose weight together.  She says it is a vegetarian diet and exercise program.  She is seeing Dr. Gerilyn Pilgrim after her visit with me.   Knee pain- pt continues to suffer from a lot of pain in her knees.  She has not been able to exercise due to the pain and feeling exhausted at the end of the day. She reports her pain is worst on the outside of her knees.  She says the injection Dr. Jennette Kettle gave her only helped a little.  She is taking aleve during the day and takes two Vicodin at bedtime.    Review of Systems Per HPI.     Objective:   Physical Exam BP 146/77  Pulse 106  Temp(Src) 98 F (36.7 C) (Oral)  Ht 5\' 5"  (1.651 m)  Wt 370 lb 3.2 oz (167.922 kg)  BMI 61.60 kg/m2 General appearance: alert, cooperative and no distress Eyes: conjunctivae/corneas clear. PERRL, EOM's intact. Fundi benign. Neck: no adenopathy, no JVD, supple, symmetrical, trachea midline and thyroid not enlarged, symmetric, no tenderness/mass/nodules Lungs: clear to auscultation bilaterally Heart: regular rate and rhythm, S1, S2 normal, no murmur, click, rub or gallop Abdomen: soft, non-tender; bowel sounds normal; no masses,  no organomegaly Extremities: extremities normal, atraumatic, no cyanosis or edema and  pain on lateral knee joint line bilaterally.  Pulses: 2+ and symmetric Neurologic: Grossly normal      Assessment & Plan:

## 2011-04-08 NOTE — Patient Instructions (Addendum)
-   Review your exercises provided by Dr. Lula Olszewski, and if any Qs, call/email Jeannie.  - DO your re-hab exercises as directed (or at least 2 x day on work days).  - Establish an exercise routine that includes the re-hab exercises AND some weights and/or arm cycle ergometer.  Write this down, and email to [facility] before Monday.   - Include lean protein with each meal for best blood sugar and appetite control.  - Continue to include lots of veg's at both and dinner.   - Congrat's on a good A1C today.   - Remember the importance of MOVING your body throughout the day!

## 2011-04-19 ENCOUNTER — Other Ambulatory Visit: Payer: Self-pay | Admitting: Family Medicine

## 2011-04-19 MED ORDER — METFORMIN HCL 1000 MG PO TABS: 1000.0000 mg | ORAL_TABLET | Freq: Two times a day (BID) | ORAL | Status: DC

## 2011-05-06 ENCOUNTER — Ambulatory Visit: Payer: Self-pay | Admitting: Family Medicine

## 2011-05-25 ENCOUNTER — Ambulatory Visit (INDEPENDENT_AMBULATORY_CARE_PROVIDER_SITE_OTHER): Payer: Self-pay | Admitting: Family Medicine

## 2011-05-25 ENCOUNTER — Encounter: Payer: Self-pay | Admitting: Family Medicine

## 2011-05-25 DIAGNOSIS — E119 Type 2 diabetes mellitus without complications: Secondary | ICD-10-CM

## 2011-05-25 NOTE — Patient Instructions (Signed)
-   Continue the great job you're doing with food choices (lots of veg's, lean protein).  - At any meal of juiced veg's/fruit, INCLUDE A SOURCE OF PROTEIN.  - Exercise routine:  1. Seated knee lifts: 3 sets of 10, each leg  2. On-side leg lifts: 3 sets of 10, each leg  3. On-back leg lifts: 3 sets of 10, each leg (KEEP OTHER LEG BENT)  4. Abdominal crunches (KNEES BENT):  (Goal is 25)  5. Biceps curls: 2 sets of 10  6. Triceps Bent-over rows: 2 sets of 10 - Exercises:  GOOD POSTURE THROUGHOUT.  GOOD TECHNIQUE AS WELL.  (Google some of these exercises for videos.)  - Use this workout routine at least once a day, and modify as needed.  Add to it or change exercises to suit you.   - Email your workout routine to [facility] no later than Dec 5.

## 2011-05-25 NOTE — Progress Notes (Signed)
Medical Nutrition Therapy:  Appt start time: 1030 end time:  1130.  Assessment:  Primary concerns today: Blood sugar control and weight management. Valerie Carter got herself a Valerie Carter juicer, and starting yesterday, she has starting to juice her breakfast.  24-hr recall suggests an intake of <1400 kcal: B (5:30 AM)- 16 oz juiced veg's & fruit (6 baby carrots, 1/4 grapefruit, 1/2 cucumber, 1/2 apple, 2 celery stalks, handful spinach); L (11:30 PM)- Tradition Meals frozen meal: salisbury steak, 1/4 c mashed potatoes, 1 banana; Snk (2 PM)- Yoplait yogurt frt at bottom; D ( PM)- 8 oz baked fish, 2 c spinach salad w/ tom, cuc, car, 4 tbsp dressing, water.  We talked about the need for balancing the CHO of her juiced fruit and veg's with some protein to help stabilize her BG levels.  Valerie Carter again expressed frustration about her wt loss progress, as she has cut out so many foods, has been very consistent in her reduced intake, and is tracking all of her foods using MyFitnessPal app.  She has been less consistent with her exercise, but her improved A1C and food records certainly does suggest she has a right to expect better progress.  In addition, Valerie Carter is in constant pain from both her knees and her back, which she knows would be helped greatly by wt loss.  She is reluctant to continue getting injections for her knee pain, partly b/c the little it does help only lasts 2-3 wks.    Progress Towards Goal(s):  Some progress.   Nutritional Diagnosis:  NB-2.1 Little progress on physical inactivity as related to knee pain as evidenced by difficulty walking and minimal exercise in recent weeks.  NI-1.5 Stable progress noted on excessive energy intake as related to expenditure as evidenced by 24-hr recall indicating intake of <1400 kcal.     Intervention:  Nutrition counseling.   Monitoring/Evaluation:  Dietary intake, body weight, and physical activity in 2 weeks.

## 2011-06-10 ENCOUNTER — Encounter: Payer: Self-pay | Admitting: Family Medicine

## 2011-06-10 ENCOUNTER — Ambulatory Visit (INDEPENDENT_AMBULATORY_CARE_PROVIDER_SITE_OTHER): Payer: Self-pay | Admitting: Family Medicine

## 2011-06-10 DIAGNOSIS — E119 Type 2 diabetes mellitus without complications: Secondary | ICD-10-CM

## 2011-06-10 NOTE — Patient Instructions (Addendum)
-   Keep up the exercise routine you're doing now, and add one:  Seated thigh lifts (hip flexors).   - GOOD posture during exercise.   - Deep breathing during exercise.   - Mini-exercise sessions throughout the day.  Goal: At least 6 mini-sessions per day.    - Track # of times you work some activity into your day.    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   - Carry with you a non-parishable snack such as a protein bar for times when meals get delayed.   - Explore [employer] HOPE fitness program:  [phone].  (Also look into [employer]'s BELT program.)

## 2011-06-10 NOTE — Progress Notes (Signed)
Medical Nutrition Therapy:  Appt start time: 1030 end time:  1115.  Assessment:  Primary concerns today: Blood sugar control and weight management. Valerie Carter got sick with a cold last Friday, but still continued doing her exercises twice daily (takes 7-10 min each time).  24-hr recall: B (5:30 AM)- tea w/ 2 tsp creamer, 2 boiled eggs; Snk ( AM)- ; L (12:30 PM)- BK Jr Whopper w/ onion rings, a couple sips of sweet tea; D ( PM)- large salad w/ 3 slc [place] bacon, boiled egg, 4 tbsp ranch dressing.  Yesterday's lunch was atypical; a mtg for work went longer than expected, and [name] felt hypoglycemic.   Valerie Carter is usually having juiced veg's and fruit for lunch, and sometimes supplements her dinner with juice as well.  She has really enjoyed the juicing, and has been careful to use mostly veg's w/ a small amount of fruit.    Progress Towards Goal(s):  Some progress.   Nutritional Diagnosis:  NB-2.1 Some progress on physical inactivity as related to knee pain as evidenced by twice daily exercises of 7-10 min each.  NI-1.5 Stable progress noted on excessive energy intake as related to expenditure as evidenced by 24-hr recall indicating intake of <1400 kcal.     Intervention:  Nutrition counseling.   Monitoring/Evaluation:  Dietary intake, body weight, and physical activity in 2 weeks.

## 2011-06-11 ENCOUNTER — Ambulatory Visit: Payer: Self-pay | Admitting: Family Medicine

## 2011-06-18 ENCOUNTER — Ambulatory Visit: Payer: Self-pay | Admitting: Family Medicine

## 2011-06-30 ENCOUNTER — Ambulatory Visit: Payer: Self-pay | Admitting: Family Medicine

## 2011-07-09 ENCOUNTER — Encounter: Payer: Self-pay | Admitting: Family Medicine

## 2011-07-09 ENCOUNTER — Ambulatory Visit (INDEPENDENT_AMBULATORY_CARE_PROVIDER_SITE_OTHER): Payer: Self-pay | Admitting: Family Medicine

## 2011-07-09 VITALS — BP 146/87 | HR 89

## 2011-07-09 DIAGNOSIS — IMO0002 Reserved for concepts with insufficient information to code with codable children: Secondary | ICD-10-CM

## 2011-07-09 DIAGNOSIS — M179 Osteoarthritis of knee, unspecified: Secondary | ICD-10-CM

## 2011-07-09 DIAGNOSIS — M25569 Pain in unspecified knee: Secondary | ICD-10-CM

## 2011-07-09 DIAGNOSIS — M1710 Unilateral primary osteoarthritis, unspecified knee: Secondary | ICD-10-CM

## 2011-07-09 NOTE — Progress Notes (Signed)
Subjective:    Patient ID: Valerie Carter, female    DOB: Nov 09, 1960, 51 y.o.   MRN: [id number]  HPI  Ms.Tetro is coming today to f/u on her b/l knee DJD. She had the last cortisone shot on 01/2011. She states that the right knee pain is worse than the left one. The pain is located in the medial aspect of both knees, 5/10 intensity, sharp, no numbness and tingling. No mechanical symptoms. No swelling. The pain improve with rest, Aleve and Vicodin nighttime.. she had good relief from her prior a cortical cortisone injection 5 month ago. She had x-rays of both knees on January 20 12th which showed tricompartmental DJD worse in the right knee than the left knee.  Patient Active Problem List  Diagnoses  . HYPERLIPIDEMIA  . OBESITY NOS  . ANEMIA, IRON DEFICIENCY  . HYPERTENSION, BENIGN ESSENTIAL  . SINUS PAIN  . DYSHIDROTIC ECZEMA  . POLYARTHRITIS  . BACK PAIN, LUMBAR, WITH RADICULOPATHY  . DM  . FOOT PAIN, BILATERAL  . KNEE PAIN, BILATERAL  . DJD (degenerative joint disease) of knee  . Dysuria   Current Outpatient Prescriptions on File Prior to Visit  Medication Sig Dispense Refill  . amitriptyline (ELAVIL) 100 MG tablet 1/2 -1  by mouth at night daily for pain.       Marland Kitchen amLODipine (NORVASC) 5 MG tablet Take 1 tablet (5 mg total) by mouth daily.  90 tablet  3  . aspirin 81 MG chewable tablet Chew 81 mg by mouth daily.        . benazepril (LOTENSIN) 40 MG tablet Take 1 tablet (40 mg total) by mouth daily.  30 tablet  11  . diclofenac sodium (VOLTAREN) 1 % GEL Apply 1 application topically 4 (four) times daily as needed.  100 g  5  . ferrous sulfate 325 (65 FE) MG tablet Take 1 tablet (325 mg total) by mouth 2 (two) times daily before lunch and supper.  30 tablet  11  . fluticasone (FLONASE) 50 MCG/ACT nasal spray 2 sprays by Nasal route daily.  16 g  0  . glipiZIDE (GLUCOTROL) 10 MG tablet Take 1 tablet (10 mg total) by mouth 2 (two) times daily before a meal.  180 tablet  3  .  hydrochlorothiazide 25 MG tablet Take 1 tablet (25 mg total) by mouth daily.  30 tablet  3  . HYDROcodone-acetaminophen (VICODIN) 5-500 MG per tablet Take two tablets by mouth at bed time prn knee pain  60 tablet  5  . Ketoprofen POWD 1 application by Does not apply route 2 (two) times daily.  100 g  0  . metFORMIN (GLUCOPHAGE) 1000 MG tablet Take 1 tablet (1,000 mg total) by mouth 2 (two) times daily with a meal.  60 tablet  11   No Known Allergies   Review of Systems  Constitutional: Negative for fever, chills, diaphoresis and fatigue.  Musculoskeletal: Positive for arthralgias and gait problem. Negative for back pain and joint swelling.  Neurological: Negative for weakness and numbness.       Objective:   Physical Exam  Constitutional: She is oriented to person, place, and time. She appears well-developed and well-nourished.       BP 146/87  Pulse 89   Pulmonary/Chest: Effort normal.  Musculoskeletal:       Right knee with intact skin, FROM. Sever Patellofemoral crepitus present with flexion and extension. Patellofemoral compression  test +. No tenderness on the quad neither or patellar tendon. TTP  in mid joint line. Ligaments intact. Lachman neg. Varus and valgus test at 0 and 30 degres neg Poor quad muscle definition.  Gait independent with a mild limp favoring the right side   Left knee with intact skin, FROM. Patellofemoral crepitus present with flexion and extension. Patellofemoral compression  test +. No tenderness on the quad neither or patellar tendon. TTP in mid joint line. Ligaments intact. Lachman neg. Varus and valgus test at 0 and 30 degres neg Poor quad muscle definition.  Gait independent with a mild limp favoring the right side    Neurological: She is alert and oriented to person, place, and time.  Skin: Skin is warm. No rash noted. No pallor.  Psychiatric: She has a normal mood and affect. Her behavior is normal.    After obtaining consent, the skin  of the anteromedial aspect of the right knee was sterilely prepped with alcohol swabs, ethyl will chloride was used for local topical anesthesia injection of 4 mL 1% lidocaine plus one mL of Kenalog was injected in intraarticular in The right knee. The procedure was well-tolerated by the patient. Patient instructed to remain in clinic for 20 minutes afterwards, and to report any adverse reaction to me immediately.   After obtaining consent, the skin of the anteromedial aspect of the left knee was sterilely prepped with alcohol swabs, ethyl will chloride was used for local topical anesthesia injection of 4 mL 1% lidocaine plus one mL of Kenalog was injected in intraarticular in The left knee. The procedure was well-tolerated by the patient. Patient instructed to remain in clinic for 20 minutes afterwards, and to report any adverse reaction to me immediately.          Assessment & Plan:   1. DJD (degenerative joint disease) of knee   2. KNEE PAIN, BILATERAL    Recommended to bike daily Recommended to take aleeve prn Recommended to lose weight

## 2011-07-13 ENCOUNTER — Other Ambulatory Visit: Payer: Self-pay | Admitting: Family Medicine

## 2011-07-13 DIAGNOSIS — J302 Other seasonal allergic rhinitis: Secondary | ICD-10-CM

## 2011-07-13 MED ORDER — FLUTICASONE PROPIONATE 50 MCG/ACT NA SUSP: 2.0000 | Freq: Every day | NASAL | Status: DC

## 2011-08-03 ENCOUNTER — Ambulatory Visit: Payer: Self-pay | Admitting: Family Medicine

## 2011-08-03 ENCOUNTER — Other Ambulatory Visit: Payer: Self-pay | Admitting: Family Medicine

## 2011-08-20 ENCOUNTER — Other Ambulatory Visit: Payer: Self-pay | Admitting: Family Medicine

## 2011-08-20 MED ORDER — GLIPIZIDE 10 MG PO TABS: 10.0000 mg | ORAL_TABLET | Freq: Two times a day (BID) | ORAL | Status: DC

## 2011-08-31 ENCOUNTER — Ambulatory Visit: Payer: Self-pay | Admitting: Family Medicine

## 2011-09-07 ENCOUNTER — Ambulatory Visit (INDEPENDENT_AMBULATORY_CARE_PROVIDER_SITE_OTHER): Payer: Self-pay | Admitting: Family Medicine

## 2011-09-07 ENCOUNTER — Encounter: Payer: Self-pay | Admitting: Family Medicine

## 2011-09-07 VITALS — BP 134/72 | HR 98 | Temp 98.2°F | Ht 65.0 in | Wt 363.3 lb

## 2011-09-07 DIAGNOSIS — J309 Allergic rhinitis, unspecified: Secondary | ICD-10-CM

## 2011-09-07 DIAGNOSIS — J302 Other seasonal allergic rhinitis: Secondary | ICD-10-CM | POA: Insufficient documentation

## 2011-09-07 DIAGNOSIS — E669 Obesity, unspecified: Secondary | ICD-10-CM

## 2011-09-07 DIAGNOSIS — E785 Hyperlipidemia, unspecified: Secondary | ICD-10-CM

## 2011-09-07 DIAGNOSIS — E119 Type 2 diabetes mellitus without complications: Secondary | ICD-10-CM

## 2011-09-07 DIAGNOSIS — Z23 Encounter for immunization: Secondary | ICD-10-CM

## 2011-09-07 DIAGNOSIS — I10 Essential (primary) hypertension: Secondary | ICD-10-CM

## 2011-09-07 DIAGNOSIS — M25569 Pain in unspecified knee: Secondary | ICD-10-CM

## 2011-09-07 LAB — BASIC METABOLIC PANEL
Chloride: 104 mEq/L (ref 96–112)
Potassium: 4.1 mEq/L (ref 3.5–5.3)
Sodium: 135 mEq/L (ref 135–145)

## 2011-09-07 LAB — LDL CHOLESTEROL, DIRECT: Direct LDL: 114 mg/dL — ABNORMAL HIGH

## 2011-09-07 MED ORDER — METFORMIN HCL 1000 MG PO TABS: 1000.0000 mg | ORAL_TABLET | Freq: Two times a day (BID) | ORAL | Status: DC

## 2011-09-07 MED ORDER — HYDROCHLOROTHIAZIDE 25 MG PO TABS: 25.0000 mg | ORAL_TABLET | Freq: Every day | ORAL | Status: DC

## 2011-09-07 MED ORDER — AMLODIPINE BESYLATE 5 MG PO TABS: 5.0000 mg | ORAL_TABLET | Freq: Every day | ORAL | Status: DC

## 2011-09-07 MED ORDER — FLUTICASONE PROPIONATE 50 MCG/ACT NA SUSP: 2.0000 | Freq: Every day | NASAL | Status: DC

## 2011-09-07 MED ORDER — GLIPIZIDE 10 MG PO TABS: 10.0000 mg | ORAL_TABLET | Freq: Two times a day (BID) | ORAL | Status: DC

## 2011-09-07 MED ORDER — BENAZEPRIL HCL 40 MG PO TABS: 40.0000 mg | ORAL_TABLET | Freq: Every day | ORAL | Status: DC

## 2011-09-07 MED ORDER — HYDROCODONE-ACETAMINOPHEN 5-500 MG PO TABS: 1.0000 | ORAL_TABLET | Freq: Three times a day (TID) | ORAL | Status: AC | PRN

## 2011-09-07 NOTE — Assessment & Plan Note (Signed)
Well controlled on current medications, will refill medications.

## 2011-09-07 NOTE — Assessment & Plan Note (Signed)
Good control in current regimen, no changes, medications refilled.

## 2011-09-07 NOTE — Patient Instructions (Signed)
It was good to see you.  I am sorry your knees are hurting so badly.  Please continue to try and exercise and watch your diet.  You have lost 7 lbs, great job!  Please be sure to get your mammogram and your eye exam.  Also, I want to check your cholesterol, so please make a lab appointment when you can come in fasting.   For your sinuses, I want you to try nasal saline rinses (Netti Pot rinses).  Also, you should continue the Flonase and take either generic zyrtec or Claritin.

## 2011-09-07 NOTE — Assessment & Plan Note (Signed)
No signs of bacterial sinus infection.  Will refill Flonase, advised scheduling claritin or zyrtec. Also advised nasal saline rinses.

## 2011-09-07 NOTE — Assessment & Plan Note (Signed)
Patient has lost 7 lbs since last visit, which is progress.  I discussed with her today that if she is not able to lose weight she will likely always be in chronic pain.  She voices understanding.

## 2011-09-07 NOTE — Assessment & Plan Note (Signed)
Chronic problem.  Patient has received corticosteroid injections, which help, but do not completely relieve pain.  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  Will prescribe vicodin again, and patient agrees to continue to work on weight loss.

## 2011-09-07 NOTE — Progress Notes (Signed)
Addended by: Jone Baseman D on: 09/07/2011 02:44 PM   Modules accepted: Orders

## 2011-09-07 NOTE — Assessment & Plan Note (Signed)
Overdue for lipid panel.  Will check LDL, but due to comorbidites, would like full panel, will ask her to come in for fasting labs.

## 2011-09-07 NOTE — Progress Notes (Signed)
  Subjective:    Patient ID: Valerie Carter, female    DOB: Apr 15, 1961, 51 y.o.   MRN: [id number]  HPI  Valerie Carter comes in for follow up.    HTN- taking medications without difficulty, no chest pain, palpitations, or dyspnea.  Does not check BP at home.   Obesity- is on a new diet, drinks lots of liquids/juices.  Has lost 7 lbs, says she feels better.   DM- reports good control, says she was concerned about he juice in the new diet, but has not had high blood sugars.  She is due for her eye exam in May.   HLD- not taking any medication currently, has always been "boarderline" but has not had cholesterol checked in 2 years.   Knee pain- patient complains of severe knee pain, it is interfering with her life.  She tries to walk and exercise, but the pain is severe and limits her.  She had steroid shots in both knees by Dr. Jennette Kettle back in January, which she says help, but she says her pain never goes away.   Sinuses- patient had cold for much of winter, but now improving.  She has nasal congestion and says it smells bad and she cannot get the mucus out.  She has not had fevers or chills.  She is using flonase but does not feel like that is helping.   Review of Systems Pertinent items in HPI.     Objective:   Physical Exam BP 134/72  Pulse 98  Temp(Src) 98.2 F (36.8 C) (Oral)  Ht 5\' 5"  (1.651 m)  Wt 363 lb 4.8 oz (164.792 kg)  BMI 60.46 kg/m2  LMP 07/23/2011 General appearance: alert, cooperative, no distress and morbidly obese Head: Normocephalic, without obvious abnormality, atraumatic, sinuses nontender to percussion Ears: normal TM's and external ear canals both ears Nose: turbinates red, swollen Throat: lips, mucosa, and tongue normal; teeth and gums normal Back: symmetric, no curvature. ROM normal. No CVA tenderness. Heart: regular rate and rhythm, S1, S2 normal, no murmur, click, rub or gallop Extremities: extremities normal, atraumatic, no cyanosis or edema Pulses: 2+ and  symmetric Knees: Normal to inspection with no erythema or effusion or obvious bony abnormalities bilaterally, exam limited by body habitus.  Palpation normal with no warmth, but + joint line tenderness medial and laterally and patellar tenderness bilaterally. ROM limited by pain in flexion (about 80 degrees in right, 70 degrees in left). and normal extension bilaterally.  Ligaments with solid consistent endpoints including ACL, PCL, LCL, MCL.         Assessment & Plan:

## 2011-09-17 ENCOUNTER — Encounter: Payer: Self-pay | Admitting: Family Medicine

## 2011-09-23 ENCOUNTER — Ambulatory Visit (INDEPENDENT_AMBULATORY_CARE_PROVIDER_SITE_OTHER): Payer: Self-pay | Admitting: Family Medicine

## 2011-09-23 ENCOUNTER — Encounter: Payer: Self-pay | Admitting: Family Medicine

## 2011-09-23 VITALS — Ht 65.0 in | Wt 359.7 lb

## 2011-09-23 DIAGNOSIS — E119 Type 2 diabetes mellitus without complications: Secondary | ICD-10-CM

## 2011-09-23 DIAGNOSIS — E669 Obesity, unspecified: Secondary | ICD-10-CM

## 2011-09-23 NOTE — Progress Notes (Signed)
Medical Nutrition Therapy:  Appt start time: 1100 end time:  1200.  Assessment:  Primary concerns today: Blood sugar control and weight management. Valerie Carter has not been able to get into journaling/food records.  She has made some significant changes, though:  She is drinking water instead of coffee (to which she used to add sweetened cream) in the AM, and she is using a juicer for fruits and veg's usually 2 X day.  She has rarely eaten chips in the past month or so, and she has never been a big sweets eater.  Valerie Carter usually starts her day with checking in on websites/FB pages related to weight loss efforts, and this has been helpful to her motivation.  She has not yet increased activity; hopes to join the [employer] with her niece next week.  Her knees are extremely painful most of the time, but said she can do seated exercise.    24-hr recall suggests intake of <1000 kcal:  B (5:15 AM)-  1 banana, boiled egg, water with lemon Snk ( AM)-  Lemon water L (11:30 PM)-  Smoothie w/ spinach, berries, lemon, turmeric, ~6 oz almond milk, 10 almonds Snk ( PM)-  Lemon water D (6:30 PM)-  Baked potato w/ sour crm, pepper, 2 boiled chx wings, juiced bok choy, parsley, lemon, celery, carrot, beet Snk ( PM)-  None  Progress Towards Goal(s):  Some progress.   Nutritional Diagnosis:  NB-2.1 No progress on physical inactivity as related to knee pain as evidenced by do regular exercise.  NI-1.5 Stable progress noted on excessive energy intake as related to expenditure as evidenced by 24-hr recall indicating intake of <1000 kcal.    Intervention:  Nutrition counseling.   Monitoring/Evaluation:  Dietary intake, body weight, and physical activity in 4 weeks.

## 2011-09-23 NOTE — Patient Instructions (Signed)
-   The RDA for protein is 0.8 g per kg body weight.   - Desirable body weight: 195 divided by 2.2 = 88.6 kg  - 88.6 X 0.8 = 70.9 g protein per day. - Protein sources:  - Meat, fish, cheese, or poultry:  7 g protein per ounce  - Soymilk: 8 g per 8 oz  - Yogurt:  4 g per 1/2 cup  - Almonds:  2/6 g per 10 nuts  - Beans:  15 g per cup  - Pro-Stat 101 protein supplement: 15 g per 2 tbsp (1 oz)  - Recommend:  Use soymilk in place of almond milk for your smoothies - Physical activity:    - [employer] next week.    - Goal:  3 X wk.   - At home:  Go through one set per day of home exercises from last fall at least 3 X wk.     - Record checkmarks on progress form (provided).

## 2011-10-18 ENCOUNTER — Ambulatory Visit: Payer: Self-pay | Admitting: Family Medicine

## 2011-11-04 ENCOUNTER — Other Ambulatory Visit: Payer: Self-pay | Admitting: Family Medicine

## 2011-11-04 DIAGNOSIS — I10 Essential (primary) hypertension: Secondary | ICD-10-CM

## 2011-11-04 MED ORDER — AMLODIPINE BESYLATE 5 MG PO TABS: 5.0000 mg | ORAL_TABLET | Freq: Every day | ORAL | Status: DC

## 2011-11-11 ENCOUNTER — Ambulatory Visit: Payer: Self-pay | Admitting: Family Medicine

## 2011-11-12 ENCOUNTER — Other Ambulatory Visit: Payer: Self-pay | Admitting: Family Medicine

## 2011-11-12 ENCOUNTER — Telehealth: Payer: Self-pay | Admitting: Family Medicine

## 2011-12-06 ENCOUNTER — Ambulatory Visit (INDEPENDENT_AMBULATORY_CARE_PROVIDER_SITE_OTHER): Payer: Self-pay | Admitting: Family Medicine

## 2011-12-06 ENCOUNTER — Encounter: Payer: Self-pay | Admitting: Family Medicine

## 2011-12-06 VITALS — BP 156/92 | HR 88 | Temp 99.2°F | Ht 65.0 in | Wt 359.4 lb

## 2011-12-06 DIAGNOSIS — E119 Type 2 diabetes mellitus without complications: Secondary | ICD-10-CM

## 2011-12-06 DIAGNOSIS — M179 Osteoarthritis of knee, unspecified: Secondary | ICD-10-CM

## 2011-12-06 DIAGNOSIS — M1710 Unilateral primary osteoarthritis, unspecified knee: Secondary | ICD-10-CM

## 2011-12-06 DIAGNOSIS — I10 Essential (primary) hypertension: Secondary | ICD-10-CM

## 2011-12-06 DIAGNOSIS — IMO0002 Reserved for concepts with insufficient information to code with codable children: Secondary | ICD-10-CM

## 2011-12-06 DIAGNOSIS — E669 Obesity, unspecified: Secondary | ICD-10-CM

## 2011-12-06 DIAGNOSIS — E785 Hyperlipidemia, unspecified: Secondary | ICD-10-CM

## 2011-12-06 LAB — LIPID PANEL
Cholesterol: 166 mg/dL (ref 0–200)
HDL: 40 mg/dL (ref >39)
Total CHOL/HDL Ratio: 4.2 Ratio

## 2011-12-06 LAB — BASIC METABOLIC PANEL
BUN: 14 mg/dL (ref 6–23)
Calcium: 9.5 mg/dL (ref 8.4–10.5)
Glucose, Bld: 156 mg/dL — ABNORMAL HIGH (ref 70–99)
Potassium: 4.2 mEq/L (ref 3.5–5.3)

## 2011-12-06 LAB — POCT GLYCOSYLATED HEMOGLOBIN (HGB A1C): Hemoglobin A1C: 6.9

## 2011-12-06 MED ORDER — NAPROXEN 500 MG PO TABS: 500.0000 mg | ORAL_TABLET | Freq: Two times a day (BID) | ORAL | Status: DC

## 2011-12-06 NOTE — Patient Instructions (Signed)
It was good to see you today.  Your Hemoglobin A1C is  Lab Results  Component Value Date   HGBA1C 6.9 12/06/2011  .  Remember your goal for A1C is less than 7.  Your goal for fasting morning blood sugar is 80-120.   Your blood pressure today was BP: 156/92 mmHg.  Remember your goal blood pressure is about 120/80.  Please be sure to take your medication every day.  Please check your blood pressure and call the office if it is over 140/90 on a regular basis.   In addition to the naproxen twice a day, I want you to take two tylenol arthritis (625 mg) tablets three times a day. You should ice your knees for 20 minutes tonight, and any time they are swollen or you ar on your feet for a long time.   I will send you a letter with your lab results.

## 2011-12-06 NOTE — Assessment & Plan Note (Signed)
Elevated today, but patient in pain.  She has monitor at home, agrees to keep close watch on it and call the office if BP above 140/90's.

## 2011-12-06 NOTE — Assessment & Plan Note (Signed)
Elevated LDL recently, discussed decreasing cholesterol (patient agrees to try egg whites without yolks).  Will check full panel today as she is overdue for labs and fasting today.

## 2011-12-06 NOTE — Assessment & Plan Note (Signed)
Bilateral CSI's today.  Will schedule naproxen and tylenol.  Again, encouraged weight loss.

## 2011-12-06 NOTE — Assessment & Plan Note (Signed)
Patient continues to try to make dietary changes.  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

## 2011-12-06 NOTE — Assessment & Plan Note (Signed)
>>  ASSESSMENT AND PLAN FOR ARTHRITIS OF KNEE, DEGENERATIVE WRITTEN ON 12/06/2011 10:07 AM BY CHAMBERLAIN, RACHEL, MD  Bilateral CSI's today.  Will schedule naproxen  and tylenol .  Again, encouraged weight loss.

## 2011-12-06 NOTE — Progress Notes (Signed)
  Subjective:    Patient ID: Valerie Carter, female    DOB: 1960-06-30, 51 y.o.   MRN: [id number]  HPI  Valerie Carter comes in for follow up.   DM- patient taking glipizide and metformin without difficulty, reports controlled fasting sugars (low 100's).  No hypoglycemic episodes.   Obesity- she is following with Dr. Gerilyn Pilgrim for nutrition counseling, she had lost some weight but now has plateaued which she is frustrated with. She says her knee pain is preventing her from doing much exercise.   Knee pain- worse lately.  Fell in the shower a few weeks ago and left knee hurting, with some catching/popping noises.  She has seen ortho in the past, has severe degenerative arthritis in both knees.  They have told her she needs to lose more than 100 lbs to have TKR, and that it would be difficult considering she does not have insurance. Last CSI were in January at [facility] with Dr. Jennette Kettle.   HTN- patient says BP has been well controlled at home, but when she is in pain like she is now, it runs high.  Denies chest pain, dyspnea, LE swelling or palpitations.   Review of Systems Pertinent items in HPI.     Objective:   Physical Exam BP 156/92  Pulse 88  Temp(Src) 99.2 F (37.3 C) (Oral)  Ht 5\' 5"  (1.651 m)  Wt 359 lb 6.4 oz (163.023 kg)  BMI 59.81 kg/m2  LMP 11/25/2011 General appearance: alert, cooperative and no distress Pulm: CTAB Heart: regular rate and rhythm, S1, S2 normal, no murmur, click, rub or gallop Pulses: 2+ and symmetric Knee: Normal to inspection with no erythema or effusion or obvious bony abnormalities. No warmth but + medial and lateral joint line tenderness bilaterally.  ROM decreased in flexion and extension ROM normal.  Ligaments with solid consistent endpoints including ACL, PCL, LCL, MCL. Positive left Mcmurray's, negative on right.   Procedure notes:  Consent obtained and verified. Sterile betadine prep. Furthur cleansed with alcohol. Topical analgesic spray: Ethyl  chloride. Joint:Right knee Approached in typical fashion with: Medial approach Completed without difficulty Meds:1cc Kenalog, 4cc Lidocane Aftercare instructions and Red flags advised.  Consent obtained and verified. Sterile betadine prep. Furthur cleansed with alcohol. Topical analgesic spray: Ethyl chloride. Joint: Left knee Approached in typical fashion with: Medial approach Completed without difficulty Meds:1cc Kenalog, 4cc Lidocane Aftercare instructions and Red flags advised.      Assessment & Plan:

## 2011-12-07 ENCOUNTER — Encounter: Payer: Self-pay | Admitting: Family Medicine

## 2011-12-27 ENCOUNTER — Telehealth: Payer: Self-pay | Admitting: Family Medicine

## 2011-12-27 NOTE — Telephone Encounter (Signed)
Pt is asking to speak to nurse about taking the glipizide.  She thinks that it is making her BS too low.

## 2011-12-27 NOTE — Telephone Encounter (Signed)
Spoke with patient . She is taking Metformin twice daily as directed.  However 2 months ago she started taking Glipizide 1/2 of 10 mg tab only at bedtime.  She decreased it like this because she was waking up sweating and having hypoglycemic effects. 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  Fasting blood sugars range generally 100-101 ,  and as high as 134  .      When she has awakened at night  sweating , blood sugar has been 63.    States she will start shaking and sweating  if BS runs below 79    Explained to patient that glipizide needs to be taken  before a meal .    Will send message to Dr. Lula Olszewski to advise on dosage for glipizide.  Again patient is only  taking 5 mg once daily at 9:00 PM currently.

## 2011-12-28 NOTE — Telephone Encounter (Signed)
Called Valerie Carter back- she says she did not take the glipizide last night so she did not wake up in the middle of the night shaking.  She says she has been exercising more and watching her diet- thinks she has lost a few pounds.  Told her to stop taking glipizide all together.  Asked her to check her blood sugars in the morning, and call if they are running too high.  Her next A1C and DM check up will be in August, we will evaluate then if her blood sugar is well controlled without glipizide. Patient voices understanding.

## 2012-04-04 ENCOUNTER — Ambulatory Visit (INDEPENDENT_AMBULATORY_CARE_PROVIDER_SITE_OTHER): Payer: Self-pay | Admitting: Family Medicine

## 2012-04-04 ENCOUNTER — Encounter: Payer: Self-pay | Admitting: Family Medicine

## 2012-04-04 VITALS — BP 158/92 | HR 89 | Temp 98.4°F | Ht 65.0 in | Wt 373.0 lb

## 2012-04-04 DIAGNOSIS — R609 Edema, unspecified: Secondary | ICD-10-CM

## 2012-04-04 DIAGNOSIS — M7660 Achilles tendinitis, unspecified leg: Secondary | ICD-10-CM | POA: Insufficient documentation

## 2012-04-04 DIAGNOSIS — IMO0002 Reserved for concepts with insufficient information to code with codable children: Secondary | ICD-10-CM

## 2012-04-04 DIAGNOSIS — E119 Type 2 diabetes mellitus without complications: Secondary | ICD-10-CM

## 2012-04-04 DIAGNOSIS — R600 Localized edema: Secondary | ICD-10-CM | POA: Insufficient documentation

## 2012-04-04 DIAGNOSIS — M6789 Other specified disorders of synovium and tendon, multiple sites: Secondary | ICD-10-CM

## 2012-04-04 DIAGNOSIS — M179 Osteoarthritis of knee, unspecified: Secondary | ICD-10-CM

## 2012-04-04 DIAGNOSIS — M1710 Unilateral primary osteoarthritis, unspecified knee: Secondary | ICD-10-CM

## 2012-04-04 DIAGNOSIS — I10 Essential (primary) hypertension: Secondary | ICD-10-CM

## 2012-04-04 LAB — BASIC METABOLIC PANEL
BUN: 16 mg/dL (ref 6–23)
Chloride: 105 mEq/L (ref 96–112)
Creat: 0.74 mg/dL (ref 0.50–1.10)

## 2012-04-04 LAB — POCT GLYCOSYLATED HEMOGLOBIN (HGB A1C): Hemoglobin A1C: 6.8

## 2012-04-04 MED ORDER — AMLODIPINE BESYLATE 5 MG PO TABS: 5.0000 mg | ORAL_TABLET | Freq: Every day | ORAL | Status: DC

## 2012-04-04 MED ORDER — HYDROCODONE-ACETAMINOPHEN 10-325 MG PO TABS: 1.0000 | ORAL_TABLET | Freq: Four times a day (QID) | ORAL | Status: DC | PRN

## 2012-04-04 MED ORDER — METFORMIN HCL 1000 MG PO TABS: 1000.0000 mg | ORAL_TABLET | Freq: Two times a day (BID) | ORAL | Status: DC

## 2012-04-04 MED ORDER — AMITRIPTYLINE HCL 25 MG PO TABS: 25.0000 mg | ORAL_TABLET | Freq: Every day | ORAL | Status: DC

## 2012-04-04 MED ORDER — HYDROCHLOROTHIAZIDE 25 MG PO TABS: 25.0000 mg | ORAL_TABLET | Freq: Every day | ORAL | Status: DC

## 2012-04-04 MED ORDER — BENAZEPRIL HCL 40 MG PO TABS: 40.0000 mg | ORAL_TABLET | Freq: Every day | ORAL | Status: DC

## 2012-04-04 NOTE — Assessment & Plan Note (Signed)
Elevated, off HCTZ, all medications refilled, f/u in 3 months.

## 2012-04-04 NOTE — Assessment & Plan Note (Signed)
Bilateral knee injections today.  D/C naproxen due to GI upset.  Will try Elavil at 25 mg po qhs for adjunct pain control, Norco, 15 pills per month, as needed for pain.  Continue tylenol.  Discussed weight loss, patient will make appointment with me for nutrition counseling.

## 2012-04-04 NOTE — Assessment & Plan Note (Signed)
Well controlled on just metformin, will d/c glipizide and continue to monitor.

## 2012-04-04 NOTE — Assessment & Plan Note (Signed)
With thickening of tendon, gave hand out with stretches, advised to ice area BID.  F/U if not improving.

## 2012-04-04 NOTE — Progress Notes (Signed)
  Subjective:    Patient ID: [name], female    DOB: [birth year], 51 y.o.   MRN: [id number]  HPI  Valerie Carter comes in for follow up.   DM- only taking metformin (glipizide was dropping her sugar).  Fasting sugars have been 80-120, no further hypoglycemic episodes.   HTN- ran out of HCTZ, taking Norvasc and benazepril.  No chest pain,dyspnea, palpitations,  However, she has had LE edema (see below).   Bilateral LE edema- has been going on for about 2 months.  No dyspnea.  No history of cardiac disease or CHF.   DJD knees, bilateral- CSI in June, worked for 2-3 months.  She has been taking tylenol, but the naproxen upsets her stomach.  She has not been taking elavil because it makes her sleep for two days.    R ankle pain- posterior ankle, lump in that area.  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   Review of Systems See HPI    Objective:   Physical Exam BP 158/92  Pulse 89  Temp 98.4 F (36.9 C) (Oral)  Ht 5\' 5"  (1.651 m)  Wt 373 lb (169.192 kg)  BMI 62.07 kg/m2 General appearance: alert, cooperative, no distress and morbidly obese Lungs: clear to auscultation bilaterally Heart: regular rate and rhythm, S1, S2 normal, no murmur, click, rub or gallop Extremities: 1+ pitting edema of bilateral LE.  Knees: TTP on medial and lateral joint lines bilaterally, no effusion, warmth, erythema.  R ankle: pain at achillis at insertion, also with palpable thickening of the tendon compared to the left.   Strength is in tact.   Right Knee Injection:  Consent obtained and verified. Sterile betadine prep. Furthur cleansed with alcohol. Topical analgesic spray: Ethyl chloride. Joint:Right knee Approached in typical fashion with: medial approach.  Completed without difficulty Meds: 1cc 40mg  DepoMedrol, 4 cc 1% lidocaine Needle:21 gauge 1.5''  Aftercare instructions and Red flags advised.  Left Knee Injection:  Consent obtained and  verified. Sterile betadine prep. Furthur cleansed with alcohol. Topical analgesic spray: Ethyl chloride. Joint:Left knee Approached in typical fashion with: medial approach.  Completed without difficulty Meds: 1cc 40mg  DepoMedrol, 4 cc 1% lidocaine Needle:21 gauge 1.5''  Aftercare instructions and Red flags advised.         Assessment & Plan:

## 2012-04-04 NOTE — Assessment & Plan Note (Signed)
Likely dependent edema, no other symptoms of CHF, but will check BNP to rule out CHF. Advised compression stockings.

## 2012-04-04 NOTE — Patient Instructions (Signed)
I am sorry you are hurting so badly.  Please ice your knees to night, and see the hand out about your achillis tendon.    I am checking some labs to make sure your leg swelling is not from your heart.   If you want to, please feel free to make an appointment with me for nutrition counseling.

## 2012-04-04 NOTE — Assessment & Plan Note (Signed)
>>  ASSESSMENT AND PLAN FOR ARTHRITIS OF KNEE, DEGENERATIVE WRITTEN ON 04/04/2012 12:06 PM BY CHAMBERLAIN, RACHEL, MD  Bilateral knee injections today.  D/C naproxen  due to GI upset.  Will try Elavil  at 25 mg po qhs for adjunct pain control, Norco, 15 pills per month, as needed for pain.  Continue tylenol .  Discussed weight loss, patient will make appointment with me for nutrition counseling.

## 2012-04-05 ENCOUNTER — Encounter: Payer: Self-pay | Admitting: Family Medicine

## 2012-04-13 ENCOUNTER — Ambulatory Visit: Payer: Self-pay | Admitting: Family Medicine

## 2012-04-25 ENCOUNTER — Encounter: Payer: Self-pay | Admitting: Family Medicine

## 2012-04-25 ENCOUNTER — Ambulatory Visit (INDEPENDENT_AMBULATORY_CARE_PROVIDER_SITE_OTHER): Payer: Self-pay | Admitting: Family Medicine

## 2012-04-25 VITALS — BP 148/76 | HR 106 | Temp 98.5°F | Ht 65.0 in | Wt 362.0 lb

## 2012-04-25 DIAGNOSIS — M1710 Unilateral primary osteoarthritis, unspecified knee: Secondary | ICD-10-CM

## 2012-04-25 DIAGNOSIS — M179 Osteoarthritis of knee, unspecified: Secondary | ICD-10-CM

## 2012-04-25 DIAGNOSIS — E669 Obesity, unspecified: Secondary | ICD-10-CM

## 2012-04-25 DIAGNOSIS — IMO0002 Reserved for concepts with insufficient information to code with codable children: Secondary | ICD-10-CM

## 2012-04-25 DIAGNOSIS — L301 Dyshidrosis [pompholyx]: Secondary | ICD-10-CM

## 2012-04-25 NOTE — Progress Notes (Signed)
Patient ID: Valerie Carter, female   DOB: 05/13/1961, 51 y.o.   MRN: [id number] Medical Nutrition Therapy:  Appt start time: 1100 end time:  1130.  Assessment:  Primary concerns today: Weight management and Blood sugar control.   Usual eating pattern includes 3 meals and 0 snacks per day. Usual physical activity includes minimal. Avoids Sweets, chips.   Everyday foods include Eggs.  Avoided foods include brown rice.   24-hr recall: B ( AM)-  2 eggs and 2 strips of bacon  Snk ( AM)-    L ( PM)- kale and ginger or fruit juice almond milk, almonds Snk ( PM)-   D ( PM)- fried chicken and french fries Snk ( PM)-     Intervention:  Nutrition: Discussed low carbohydrate diet as good for fat loss.  Discussed increasing vegetables, try eating with each meal, and to try to plan ahead for dinner so that she can make good food choices.  At this time, she does not want to calorie count or carb count, which I am supportive of, but we will try a low carbohydrate diet, with center of diet lean proteins (eggs/fish/poultry) and a variety of veggies.    She will also try to incorporate exercise in her routine.  She has a new app on her phone that has different exercises, and it has a beginner level, and she feels like she can do most of those exercises.  Her OA limits her ability to exercise.   Follow up in 2 weeks.

## 2012-04-25 NOTE — Patient Instructions (Signed)
Goals:  Exercise: Try your new exercise app on your phone 5 days a week.   Nutrition:  To help control your blood sugars, and encourage your body to burn fat please avoid and minimize foods with lots of carbohydrates and sugars.  Those foods include breads, pastas, potatoes, corn, sweets and deserts.  Try to increase the amount and variety of vegetables you eat, and include a veggie with each meal (try adding one to breakfast).   Make sure to have a lean source of protein with each meal.   For dinner, plan ahead, avoid fried foods.  Try to have lean protein and veggies.  If you find yourself very hungry, then add back a small portion of whole grain starches.

## 2012-05-11 ENCOUNTER — Ambulatory Visit: Payer: Self-pay | Admitting: Family Medicine

## 2012-05-18 ENCOUNTER — Ambulatory Visit: Payer: Self-pay | Admitting: Family Medicine

## 2012-06-26 ENCOUNTER — Other Ambulatory Visit: Payer: Self-pay | Admitting: Family Medicine

## 2012-06-26 DIAGNOSIS — Z1231 Encounter for screening mammogram for malignant neoplasm of breast: Secondary | ICD-10-CM

## 2012-07-06 ENCOUNTER — Ambulatory Visit (HOSPITAL_COMMUNITY)
Admission: RE | Admit: 2012-07-06 | Discharge: 2012-07-06 | Disposition: A | Discharge status: Home / Self Care | Payer: Self-pay | Source: Ambulatory Visit | Attending: Family Medicine | Admitting: Family Medicine

## 2012-07-06 DIAGNOSIS — Z1231 Encounter for screening mammogram for malignant neoplasm of breast: Secondary | ICD-10-CM | POA: Insufficient documentation

## 2012-08-23 ENCOUNTER — Ambulatory Visit (INDEPENDENT_AMBULATORY_CARE_PROVIDER_SITE_OTHER): Payer: No Typology Code available for payment source | Admitting: Family Medicine

## 2012-08-23 VITALS — BP 186/100 | HR 106 | Ht 65.0 in | Wt 366.0 lb

## 2012-08-23 DIAGNOSIS — M25562 Pain in left knee: Secondary | ICD-10-CM

## 2012-08-23 DIAGNOSIS — M25561 Pain in right knee: Secondary | ICD-10-CM

## 2012-08-23 DIAGNOSIS — M25569 Pain in unspecified knee: Secondary | ICD-10-CM

## 2012-08-23 MED ORDER — METHYLPREDNISOLONE ACETATE 80 MG/ML IJ SUSP
80.0000 mg | Freq: Once | INTRAMUSCULAR | Status: AC
  Administered 2012-08-23: 80 mg via INTRA_ARTICULAR

## 2012-08-23 MED ORDER — METHYLPREDNISOLONE ACETATE 40 MG/ML IJ SUSP: 40.0000 mg | Freq: Once | INTRAMUSCULAR | Status: DC

## 2012-08-23 MED ORDER — HYDROCODONE-ACETAMINOPHEN 10-325 MG PO TABS: 1.0000 | ORAL_TABLET | Freq: Four times a day (QID) | ORAL | Status: DC | PRN

## 2012-08-23 NOTE — Patient Instructions (Signed)
Please come back and see me in about 2 weeks about your blood pressure.   Please ask the front desk to schedule you in Nutrition Clinic with Dr. Gerilyn Pilgrim and I on either March 20th or 27th.  I hope you feel better.

## 2012-08-23 NOTE — Progress Notes (Signed)
  Subjective:    Patient ID: Valerie Carter, female    DOB: 07/15/1960, 52 y.o.   MRN: [id number]  HPI  Jadamarie comes in for knee pain that is worsened.  She says her knees are hurting so badly she cannot sleep, it is hard to stand from sitting.  She did have a mechanical fall about a week ago due to the knee pain.  She denies any dizziness, chest pain associated with the fall.    She knows her weight is causing her pain.  She also hurts in her back, shoulders, all over the place.  She asked me today if Dr. Gerilyn Pilgrim still works in our office.   Review of Systems See HPI    Objective:   Physical Exam BP 186/100  Pulse 106  Ht 5\' 5"  (1.651 m)  Wt 366 lb (166.017 kg)  BMI 60.91 kg/m2 General appearance: alert, cooperative and no distress Morbidly obese  Knees: exam skewed by obesity  No obvious effusion or joint laxity.  +bilateral medial and lateral joint line tenderness to palpation  Right Knee Injection:  Consent obtained and verified.  Sterile betadine prep. Furthur cleansed with alcohol.  Topical analgesic spray: Ethyl chloride.  Joint:Right knee  Approached in typical fashion with: medial approach.  Completed without difficulty  Meds: 1cc 40mg  DepoMedrol, 4 cc 1% lidocaine  Needle:21 gauge 1.5''  Aftercare instructions and Red flags advised.   Left Knee Injection:  Consent obtained and verified.  Sterile betadine prep. Furthur cleansed with alcohol.  Topical analgesic spray: Ethyl chloride.  Joint:Left knee  Approached in typical fashion with: medial approach.  Completed without difficulty  Meds: 1cc 40mg  DepoMedrol, 4 cc 1% lidocaine  Needle:21 gauge 1.5''  Aftercare instructions and Red flags advised.       Assessment & Plan:

## 2012-08-24 ENCOUNTER — Telehealth: Payer: Self-pay | Admitting: Family Medicine

## 2012-08-24 NOTE — Telephone Encounter (Signed)
No, not my clinic. She needs to be scheduled in Nutrition clinic in the morning one of those days, I am working with Dr. Gerilyn Pilgrim in her nutrition clinic those days.  Please call her back to schedule.  Thanks.

## 2012-08-24 NOTE — Telephone Encounter (Signed)
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  Is there any morning dates that we can get this scheduled.

## 2012-08-25 NOTE — Telephone Encounter (Signed)
Called and left message for patient to return call for appt in nutrition clinic on either 3/20 or 3/27

## 2012-09-14 ENCOUNTER — Ambulatory Visit (INDEPENDENT_AMBULATORY_CARE_PROVIDER_SITE_OTHER): Payer: No Typology Code available for payment source | Admitting: Family Medicine

## 2012-09-14 VITALS — Ht 65.0 in | Wt 353.6 lb

## 2012-09-14 DIAGNOSIS — E119 Type 2 diabetes mellitus without complications: Secondary | ICD-10-CM

## 2012-09-14 DIAGNOSIS — E669 Obesity, unspecified: Secondary | ICD-10-CM

## 2012-09-14 NOTE — Patient Instructions (Addendum)
Goals:  Look into a different kind of nutritional drink- you can try protein powder to make drinks or look for a high protein and lower sugar/carbohydrate drink.  Add more veggies- you can buy frozen ones that are quick and easy to microwave, or pre-prep (wash/chop) veggies a few times a week, like Sunday and Wednesday.  Try replacing carbohydrate high snacks with vegetables or fruits- again plan ahead so you can pack healthy snacks.  - Continue to use the app on your I-pad to keep track of your nutrition.

## 2012-09-14 NOTE — Assessment & Plan Note (Signed)
Spent >25 minutes face to face time on weight management.  Discussed meal planning, appropriate nutritional supplement drinks, avoiding carbohydrates and replacing with vegetables.  See patient instructions.

## 2012-09-14 NOTE — Progress Notes (Signed)
Patient ID: TRAVONNA SWINDLE, female   DOB: 16-Oct-1960, 52 y.o.   MRN: [id number] Medical Nutrition Therapy:  Appt start time: 1030 end time:  1100.  Assessment:  Primary concerns today: Weight management and Blood sugar control.   Usual eating pattern includes breakfast 5:30, sometimes AM snack,  lunch 11:30 ensure, 2 PM Dinner 7 pm. Usual physical activity includes some walking  Everyday foods include green tea, lemon water, ensure twice a day.  Avoided foods include red meat.  She also says she does not like left overs so has a hard time doing meal prep once weekly.   24-hr recall: (Up at  AM) B ( 5:30 AM)-  Green tea, ensure Snk ( AM)-   am snack 2 small bags pop corn, L ( 11:30 AM)-   lunch strawberry banana + ensure with a little honey  Snk ( PM)-  4 pm corn chips with sour cream D ( 7 PM)-  Hamburger (no bun) pickles and lettuce on the side 1 glass of sweet red wine Snk ( PM)-    Progress Towards Goal(s):  In progress.

## 2012-09-29 ENCOUNTER — Ambulatory Visit: Payer: No Typology Code available for payment source | Admitting: Family Medicine

## 2012-10-12 ENCOUNTER — Ambulatory Visit: Payer: No Typology Code available for payment source | Admitting: Family Medicine

## 2012-10-23 ENCOUNTER — Ambulatory Visit: Payer: No Typology Code available for payment source | Admitting: Family Medicine

## 2012-11-01 ENCOUNTER — Other Ambulatory Visit: Payer: Self-pay | Admitting: Family Medicine

## 2012-11-10 ENCOUNTER — Ambulatory Visit: Payer: Self-pay | Admitting: Family Medicine

## 2012-11-27 ENCOUNTER — Ambulatory Visit (INDEPENDENT_AMBULATORY_CARE_PROVIDER_SITE_OTHER): Payer: No Typology Code available for payment source | Admitting: Family Medicine

## 2012-11-27 ENCOUNTER — Encounter: Payer: Self-pay | Admitting: Family Medicine

## 2012-11-27 ENCOUNTER — Other Ambulatory Visit: Payer: Self-pay | Admitting: Family Medicine

## 2012-11-27 VITALS — BP 143/77 | HR 103 | Ht 65.0 in | Wt 365.0 lb

## 2012-11-27 DIAGNOSIS — E119 Type 2 diabetes mellitus without complications: Secondary | ICD-10-CM

## 2012-11-27 DIAGNOSIS — E785 Hyperlipidemia, unspecified: Secondary | ICD-10-CM

## 2012-11-27 DIAGNOSIS — IMO0002 Reserved for concepts with insufficient information to code with codable children: Secondary | ICD-10-CM

## 2012-11-27 DIAGNOSIS — M179 Osteoarthritis of knee, unspecified: Secondary | ICD-10-CM

## 2012-11-27 DIAGNOSIS — I10 Essential (primary) hypertension: Secondary | ICD-10-CM

## 2012-11-27 DIAGNOSIS — E669 Obesity, unspecified: Secondary | ICD-10-CM

## 2012-11-27 DIAGNOSIS — M1710 Unilateral primary osteoarthritis, unspecified knee: Secondary | ICD-10-CM

## 2012-11-27 LAB — BASIC METABOLIC PANEL
BUN: 12 mg/dL (ref 6–23)
Creat: 0.78 mg/dL (ref 0.50–1.10)
Glucose, Bld: 214 mg/dL — ABNORMAL HIGH (ref 70–99)
Potassium: 4.1 mEq/L (ref 3.5–5.3)

## 2012-11-27 LAB — POCT GLYCOSYLATED HEMOGLOBIN (HGB A1C): Hemoglobin A1C: 8

## 2012-11-27 LAB — LIPID PANEL
Cholesterol: 170 mg/dL (ref 0–200)
Total CHOL/HDL Ratio: 3.6 Ratio
Triglycerides: 94 mg/dL (ref <150)
VLDL: 19 mg/dL (ref 0–40)

## 2012-11-27 MED ORDER — TRAMADOL HCL 50 MG PO TABS: 50.0000 mg | ORAL_TABLET | Freq: Three times a day (TID) | ORAL | Status: AC | PRN

## 2012-11-27 MED ORDER — GLIPIZIDE 5 MG PO TABS: 5.0000 mg | ORAL_TABLET | Freq: Two times a day (BID) | ORAL | Status: DC

## 2012-11-27 MED ORDER — GLIPIZIDE 5 MG PO TABS: ORAL_TABLET | ORAL | Status: DC

## 2012-11-27 MED ORDER — HYDROCODONE-ACETAMINOPHEN 10-325 MG PO TABS: 1.0000 | ORAL_TABLET | Freq: Four times a day (QID) | ORAL | Status: DC | PRN

## 2012-11-27 MED ORDER — METHYLPREDNISOLONE ACETATE 40 MG/ML IJ SUSP
80.0000 mg | Freq: Once | INTRAMUSCULAR | Status: AC
  Administered 2012-11-27: 80 mg via INTRA_ARTICULAR

## 2012-11-27 NOTE — Assessment & Plan Note (Signed)
Relatively good control on current regimen, continue current medications, check BMET.

## 2012-11-27 NOTE — Assessment & Plan Note (Signed)
>>  ASSESSMENT AND PLAN FOR ARTHRITIS OF KNEE, DEGENERATIVE WRITTEN ON 11/27/2012 11:29 AM BY CHAMBERLAIN, RACHEL, MD  Bilateral corticosteroid knee injections today.  Rx for tramadol  for day time use and vicodin for night time use/severe pain.  Discussed weight management.

## 2012-11-27 NOTE — Assessment & Plan Note (Signed)
Due for lipid panel, will draw today

## 2012-11-27 NOTE — Assessment & Plan Note (Signed)
Bilateral corticosteroid knee injections today.  Rx for tramadol for day time use and vicodin for night time use/severe pain.  Discussed weight management.

## 2012-11-27 NOTE — Patient Instructions (Signed)
It was good to see you today.  Your Hemoglobin A1C is  Lab Results  Component Value Date   HGBA1C 8.0 11/27/2012  .  Remember your goal for A1C is less than 7.  Your goal for fasting morning blood sugar is 80-120.   I want you to take 5 mg of glipizide in the morning and 2.5 mg in the evening.  If you are still having low sugars in the middle of the night call the office.   Please continue to watch your diet and try walking for exercise.

## 2012-11-27 NOTE — Assessment & Plan Note (Signed)
Slight increase in A1C to 8, will add back glipizide at 5 in the am and 2.5 in the PM, continue metformin.  F/u in 3 months.

## 2012-11-27 NOTE — Assessment & Plan Note (Signed)
Patient has gained some weight back since last visit.  She is in a lot of pain which limits her ability to exercise.  Will add tramadol to pain regimen.  Advised to continue to exercise as much as tolerated.  Discussed diet management.

## 2012-11-27 NOTE — Addendum Note (Signed)
Addended by: Henri Medal on: 11/27/2012 12:25 PM   Modules accepted: Orders

## 2012-11-27 NOTE — Progress Notes (Signed)
  Subjective:    Patient ID: Valerie Carter, female    DOB: 01/22/1961, 52 y.o.   MRN: [id number]  HPI:  Denies comes in for follow up:  DM: Taking metformin, but has stopped taking glipizide due to hypoglycemia (sugars around 50) in the middle of the night.  Was taking 10 mg BID, tried taking 1/2 BID, but still had the problem so stopped it all together.  However, he fasting sugars have been elevated, around 200 or 220.   Obesity: Has gained some weight back, is frustrated.  Still watching her diet, but says she tried walking for a few weeks but it made her pain so much worse she stopped.   Chronic pain: Has knee pain from arthritis and meniscus tear in L.  Says pain limits her mobility.  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  Is looking forward to summer as she will have some rest.  Last Knee injections in February, helped for about 2 months.   Past Medical History  Diagnosis Date  . Hyperlipidemia   . Hypertension   . Depression     History  Substance Use Topics  . Smoking status: Never Smoker   . Smokeless tobacco: Never Used  . Alcohol Use: Yes     Comment: rare     ROS Pertinent items in HPI    Objective:  Physical Exam:  BP 143/77  Pulse 103  Ht 5\' 5"  (1.651 m)  Wt 365 lb (165.563 kg)  BMI 60.74 kg/m2  LMP 10/23/2012 General appearance: alert, cooperative and no distress, morbidly obese Head: Normocephalic, without obvious abnormality, atraumatic Lungs: clear to auscultation bilaterally Heart: regular rate and rhythm, S1, S2 normal, no murmur, click, rub or gallop Pulses: 2+ and symmetric  Consent obtained and verified. Skin cleansed with alcohol. Topical analgesic spray: Ethyl chloride. Joint: Right and Left knees Approached in typical fashion with:lateral approach Completed without difficulty Meds:40 mg Depomedrol and 4 cc's 1% lidocaine (each knee) Needle:21 g 1 1/2 inch needle (each  knee) Aftercare instructions and Red flags advised.      Assessment & Plan:

## 2012-11-28 ENCOUNTER — Encounter: Payer: Self-pay | Admitting: Family Medicine

## 2013-01-04 ENCOUNTER — Other Ambulatory Visit: Payer: Self-pay

## 2013-01-29 ENCOUNTER — Ambulatory Visit (INDEPENDENT_AMBULATORY_CARE_PROVIDER_SITE_OTHER): Payer: No Typology Code available for payment source | Admitting: Family Medicine

## 2013-01-29 ENCOUNTER — Encounter: Payer: Self-pay | Admitting: Family Medicine

## 2013-01-29 VITALS — BP 147/77 | HR 100 | Temp 98.5°F | Ht 65.0 in | Wt 350.0 lb

## 2013-01-29 DIAGNOSIS — R300 Dysuria: Secondary | ICD-10-CM

## 2013-01-29 DIAGNOSIS — N390 Urinary tract infection, site not specified: Secondary | ICD-10-CM

## 2013-01-29 DIAGNOSIS — I10 Essential (primary) hypertension: Secondary | ICD-10-CM

## 2013-01-29 LAB — POCT URINALYSIS DIPSTICK
Glucose, UA: NEGATIVE
Ketones, UA: NEGATIVE
Spec Grav, UA: 1.025

## 2013-01-29 LAB — POCT UA - MICROSCOPIC ONLY

## 2013-01-29 MED ORDER — CEPHALEXIN 500 MG PO CAPS: 500.0000 mg | ORAL_CAPSULE | Freq: Four times a day (QID) | ORAL | Status: DC

## 2013-01-29 NOTE — Assessment & Plan Note (Signed)
Poorly controlled. Did not take medicine last night (encouraged not to miss doses). Encouraged patient to take medicine and f/u with PCP-also needs to discuss DM

## 2013-01-29 NOTE — Progress Notes (Signed)
  [facility] Tana Conch, MD Phone: [phone]  Subjective:   # Urinary Tract Infection Patient complains of dysuria and suprapubic pressure. She has had symptoms for 3 days. Patient also complains of slight back pain but has been walking a lot lately and thinks she may have strained her low back. No CVA tenderness.  Patient denies fever, vaginal discharge and nausea/vomiting. Patient does not have a history of recurrent UTI. She has been taking AZO which has not helped her symptoms.   # Hypertension BP Readings from Last 3 Encounters:  01/29/13 147/77  11/27/12 143/77  08/23/12 186/100  Home BP monitoring-no Compliant with medications-yes without side effects, but missed dose last night as she fell asleep early Denies any CP, HA, SOB, blurry vision, changes in LE edema (1+), transient weakness, orthopnea, PND.   ROS--See HPI  Past Medical History-DJD, morbid obesity, OA in knees (takes narcotics), DM, hypertension, hyperlipidemia. No history of kidney disease.   Reviewed problem list.  Medications- reviewed and updated Chief complaint-noted  Objective: BP 147/77  Pulse 100  Temp(Src) 98.5 F (36.9 C) (Oral)  Ht 5\' 5"  (1.651 m)  Wt 350 lb (158.759 kg)  BMI 58.24 kg/m2  LMP 01/15/2013 Gen: NAD, resting comfortably CV: RRR no murmurs rubs or gallops Lungs: CTAB no crackles, wheeze, rhonchi Abdomen: soft/nondistended/no rebounding or guarding. Suprapubic discomfort with palpation MSK: no CVA tenderness Skin: warm, dry Neuro: grossly normal, moves all extremities Ext; 1+ edema  Results for orders placed in visit on 01/29/13 (from the past 24 hour(s))  POCT URINALYSIS DIPSTICK     Status: Abnormal   Collection Time    01/29/13 10:19 AM      Result Value Range   Color, UA ORANGE     Clarity, UA CLEAR     Glucose, UA NEG     Bilirubin, UA       Ketones, UA NEG     Spec Grav, UA 1.025     Blood, UA TRACE-INTACT     pH, UA 6.0     Protein, UA  NEG     Urobilinogen, UA       Nitrite, UA       Leukocytes, UA Negative    POCT UA - MICROSCOPIC ONLY     Status: None   Collection Time    01/29/13 10:19 AM      Result Value Range   WBC, Ur, HPF, POC OCC     RBC, urine, microscopic 1-3     Bacteria, U Microscopic 2+     Epithelial cells, urine per micros 5-10      Assessment/Plan:   # Urinary Tract Infection 1-3 RBC, occasional WBC, 2+ bacteria (though poor sample with 5-10 epi cells) along with dysuria and suprapubic tenderness. Concern for UTI. No signs/symptoms of pyelo. Back pain likely from strain from exercising so much (encouraged her to continue). Will treat with Keflex 4x a day for 7 days. Follow up if not improved (did not culture).   Patient had also felt a twinge of pain in her right calf a day ago but legs are equal in size and patient without pain and negative homan's. No risk factors for PE though patient was concerned of this. Told patient also likely due to walking and strain at some piont.

## 2013-01-29 NOTE — Patient Instructions (Addendum)
Come back for fevers, chills, worsened back pain. If symptoms do not resolve in 7-10 days also please return.  Please complete all of the medication (keflex) I also want you to meet Dr. Gayla Doss soon so go ahead and schedule with him today to follow up on knee pain.  Thanks, Dr. Durene Cal  Health Maintenance Due  Topic Date Due  . Foot Exam  11/18/1970  . Ophthalmology Exam  11/18/1970  . Urine Microalbumin -not needed 11/18/1970  . Pap Smear  06/28/2009  . Colonoscopy  11/18/2010

## 2013-02-08 ENCOUNTER — Ambulatory Visit (INDEPENDENT_AMBULATORY_CARE_PROVIDER_SITE_OTHER): Payer: No Typology Code available for payment source | Admitting: Family Medicine

## 2013-02-08 ENCOUNTER — Encounter: Payer: Self-pay | Admitting: Family Medicine

## 2013-02-08 VITALS — BP 162/94 | HR 90 | Temp 98.5°F | Wt 352.0 lb

## 2013-02-08 DIAGNOSIS — I10 Essential (primary) hypertension: Secondary | ICD-10-CM

## 2013-02-08 DIAGNOSIS — M7660 Achilles tendinitis, unspecified leg: Secondary | ICD-10-CM

## 2013-02-08 DIAGNOSIS — M6789 Other specified disorders of synovium and tendon, multiple sites: Secondary | ICD-10-CM

## 2013-02-08 DIAGNOSIS — E119 Type 2 diabetes mellitus without complications: Secondary | ICD-10-CM

## 2013-02-08 MED ORDER — MELOXICAM 15 MG PO TABS: 15.0000 mg | ORAL_TABLET | Freq: Every day | ORAL | Status: DC

## 2013-02-08 MED ORDER — HYDROCODONE-ACETAMINOPHEN 10-325 MG PO TABS: 1.0000 | ORAL_TABLET | Freq: Every evening | ORAL | Status: DC | PRN

## 2013-02-08 NOTE — Progress Notes (Signed)
Chief Complaint  Patient presents with  . Follow-up    pain medication     HPI  1) leg pain - was diagnosed with achilles tendinitis  - has been having significant pain there - walking to try and lose weight and it has been working but now struggling to walk due to pain - also has chronic knee and hip pain.  - drives a school bus  2) DM - sugars have gotten better - last a1c of 8 11/27/12 - on metformin - at last visit was started back on glipizide 5mg  at day and 2.5mg  at night - sugars are around 120 in the AM and 149 post prandial - none over 200 - no dysuria, frequency    3) HTN - fell asleep early and didn't take meds last night - usually misses her meds 2x a week - has only slept 12 hours in the last week  - normally takes benazepril, norvasc, hctz - is having headaches - no fevers, chills, vision changes, lee, chest pain, SOB  Past Medical History  Diagnosis Date  . Hyperlipidemia   . Hypertension   . Depression    No past surgical history on file. No family history on file.  Social History History  Substance Use Topics  . Smoking status: Never Smoker   . Smokeless tobacco: Never Used  . Alcohol Use: Yes     Comment: rare    Current Outpatient Prescriptions  Medication Sig Dispense Refill  . amitriptyline (ELAVIL) 25 MG tablet Take 1 tablet (25 mg total) by mouth at bedtime.  30 tablet  11  . amLODipine (NORVASC) 5 MG tablet Take 1 tablet (5 mg total) by mouth daily.  90 tablet  3  . aspirin 81 MG chewable tablet Chew 81 mg by mouth daily.        . benazepril (LOTENSIN) 40 MG tablet Take 1 tablet (40 mg total) by mouth daily.  90 tablet  3  . cephALEXin (KEFLEX) 500 MG capsule Take 1 capsule (500 mg total) by mouth 4 (four) times daily. For UTI.  28 capsule  0  . ferrous sulfate 325 (65 FE) MG tablet Take 1 tablet (325 mg total) by mouth 2 (two) times daily before lunch and supper.  30 tablet  11  . fluticasone (FLONASE) 50 MCG/ACT nasal spray Place 2  sprays into the nose daily.  16 g  6  . glipiZIDE (GLUCOTROL) 5 MG tablet Take one tablet in the morning and 1/2 a tablet in the evening  120 tablet  3  . hydrochlorothiazide (HYDRODIURIL) 25 MG tablet Take 1 tablet (25 mg total) by mouth daily.  90 tablet  3  . HYDROcodone-acetaminophen (NORCO) 10-325 MG per tablet Take 1 tablet by mouth at bedtime as needed for pain.  30 tablet  0  . meloxicam (MOBIC) 15 MG tablet Take 1 tablet (15 mg total) by mouth daily.  30 tablet  2  . metFORMIN (GLUCOPHAGE) 1000 MG tablet Take 1 tablet (1,000 mg total) by mouth 2 (two) times daily with a meal.  180 tablet  3   No current facility-administered medications for this visit.   No Known Allergies  Review of Systems  See hPI  BP 162/94  Pulse 90  Temp(Src) 98.5 F (36.9 C) (Oral)  Wt 352 lb (159.666 kg)  BMI 58.58 kg/m2  LMP 01/15/2013 Physical Exam GEN: NAD, obese HEENT: PERRL PULM: LCTAB CV: RRR, no murmurs ABD: obese EXT: palpable knot on right achilles tendon  at insertion worse with resisted dorsi and plantar flexion.  Some limitation to dorsiflexion.  +Thompson's.    Assessment:  HYPERTENSION, BENIGN ESSENTIAL  DM  Achilles tendon pain - Plan: Ambulatory referral to Physical Therapy  Uncontrolled hypertension  Plan:  1) HTN - Uncontrolled - not taking meds regularly - start taking hctz and norvasc in am to remember better - take benazepril at night as this sometimes makes her light headed - no signs of emergency currently  2) dm - not at goal at last visit - pt not wanting a1c checked today - per report, sugars improved. 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 Could also be a component of burisitis. 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 I do not find this unreasonable given her chronic arthritis. rx x1 month given until she gets into PT.   F/u with PCP in 1 month or first available gi en uncontrolled medical problems.   -

## 2013-02-08 NOTE — Patient Instructions (Signed)
1) achilles tendinitis - try the pain medicine  2) please keep track of your blood pressure - try taking the benazepril at night and the HCTZ and norvasc in the AM  3) DM - at your next visit we will need to check your A1C  Come back in 1 month

## 2013-02-20 ENCOUNTER — Ambulatory Visit: Payer: No Typology Code available for payment source | Admitting: Rehabilitation

## 2013-03-01 ENCOUNTER — Telehealth: Payer: Self-pay | Admitting: Family Medicine

## 2013-03-01 NOTE — Telephone Encounter (Signed)
Pt called because she wanted to know if we assigned her a new doctor yet. She said that Dr. Gayla Doss schedule does not fit her schedule and said that she had talked to someone at this office who would change her doctor. JW

## 2013-03-02 NOTE — Telephone Encounter (Signed)
Patient is calling back to check the status of the PCP change request.  Her sister, Avanell Shackleton ([id number]), sees Dr. Richarda Blade, and the patient does prefer a female.  She wanted to schedule an appt to come in for a DM/HTN f/u with a female, but she drives a school bus and it turns out that Dr. Cherre Huger schedule did not work well with hers either.  So, I was able to schedule her with Dr. Karie Schwalbe on 9/17 and she was happy.

## 2013-03-02 NOTE — Telephone Encounter (Signed)
PCP change made in Epic to Dr. Wyline Mood, Maryjean Ka, RN

## 2013-03-14 ENCOUNTER — Encounter: Payer: Self-pay | Admitting: Family Medicine

## 2013-03-14 ENCOUNTER — Ambulatory Visit (INDEPENDENT_AMBULATORY_CARE_PROVIDER_SITE_OTHER): Payer: No Typology Code available for payment source | Admitting: Family Medicine

## 2013-03-14 VITALS — BP 128/84 | HR 86 | Temp 98.2°F | Wt 348.0 lb

## 2013-03-14 DIAGNOSIS — M1710 Unilateral primary osteoarthritis, unspecified knee: Secondary | ICD-10-CM

## 2013-03-14 DIAGNOSIS — E119 Type 2 diabetes mellitus without complications: Secondary | ICD-10-CM

## 2013-03-14 DIAGNOSIS — IMO0002 Reserved for concepts with insufficient information to code with codable children: Secondary | ICD-10-CM

## 2013-03-14 DIAGNOSIS — M179 Osteoarthritis of knee, unspecified: Secondary | ICD-10-CM

## 2013-03-14 LAB — POCT GLYCOSYLATED HEMOGLOBIN (HGB A1C): Hemoglobin A1C: 6.5

## 2013-03-14 MED ORDER — METHYLPREDNISOLONE ACETATE 40 MG/ML IJ SUSP
80.0000 mg | Freq: Once | INTRAMUSCULAR | Status: AC
  Administered 2013-03-14: 80 mg via INTRA_ARTICULAR

## 2013-03-14 MED ORDER — HYDROCODONE-ACETAMINOPHEN 10-325 MG PO TABS: 1.0000 | ORAL_TABLET | Freq: Every evening | ORAL | Status: DC | PRN

## 2013-03-14 NOTE — Progress Notes (Signed)
Patient ID: Valerie Carter, female   DOB: Nov 25, 1960, 52 y.o.   MRN: [id number] Subjective:   CC: Knee pain bilaterally  HPI:   1. Knee pain bilaterally - Patient reports leg pain that is making it difficult for her to walk regularly like she wants to. Pain is 8/10 at knees bilaterally and she had injection 3 months ago that somewhat helped. Denies redness, inflammation, or signs of infection. Denies falls. Knee hurts worse after walking.  2. DM - Checking A1c today. Had brief discussion about diet but most of the visit was dedicated to knee injection bilaterally and patient would like to f/u when convenient for diabetes follow up. Re: diet and exercise,  - She is doing smoothies twice daily with veggies and fruit. She does not eat breakfast and is unsure if she is getting enough protein as she does not eat meat.  - She has begun exercising by walking 3 days / week and states she has lost 20 lbs.   Review of Systems - Per HPI. Additionally, she has neck discomfort and numbness down both arms into first three fingers, worse after waking in the morning. Denies bowel/bladder changes or  incontinence.  SH: Patient has been working on losing weight lately and states she has lost 20 lbs. She is trying to walk regularly and has changed her diet drastically (states she is eating 2 shakes a day).  PMH: Last knee injections 3 months ago early June    Objective:  Physical Exam BP 128/84  Pulse 86  Temp(Src) 98.2 F (36.8 C) (Oral)  Wt 348 lb (157.852 kg)  BMI 57.91 kg/m2  LMP 02/13/2013 GEN: NAD, obese EXTR: difficult to locate landmarks, mild left-sided effusion, no right effusion, no erythema or tenderness to palpation; Gait antalgic with bilateral tenderness on walking  Knee injection procedure note: Informed consent obtained. A steroid injection was performed at bilateral infrapatellar joint space by lateral approach using 4mg  1% Lidocaine with epinephrine and 1 mg of methylprednisolone on  left and right knees. This was well tolerated. Time out performed, cleaned with beta-dine, wiped clean with alcohol and patient tolerated procedure well with no complications or blood loss. Sterile bandage applied.   Assessment:     Valerie Carter is a 52 y.o. female with h/o osteoarthritis here for A1c check and knee injections.    Plan:     # See problem list for problem-specific plans. - F/u when available to discuss body pains, hand numbness, and knot right heel

## 2013-03-14 NOTE — Patient Instructions (Addendum)
We did a knee injection today on both knees.   Come back when you are free for follow up of blood pressure and diabetes. Blood pressure looked good today. Your A1c was done and I can call you if this is higher than before or we can discuss on follow up.  Come back if you have any fevers/chills, worsened pain, or other complications which are rare.  Knee Injection Joint injections are shots. Your caregiver will place a needle into your knee joint. The needle is used to put medicine into the joint. These shots can be used to help treat different painful knee conditions such as osteoarthritis, bursitis, local flare-ups of rheumatoid arthritis, and pseudogout. Anti-inflammatory medicines such as corticosteroids and anesthetics are the most common medicines used for joint and soft tissue injections.  PROCEDURE  The skin over the kneecap will be cleaned with an antiseptic solution.  Your caregiver will inject a small amount of a local anesthetic (a medicine like Novocaine) just under the skin in the area that was cleaned.  After the area becomes numb, a second injection is done. This second injection usually includes an anesthetic and an anti-inflammatory medicine called a steroid or cortisone. The needle is carefully placed in between the kneecap and the knee, and the medicine is injected into the joint space.  After the injection is done, the needle is removed. Your caregiver may place a bandage over the injection site. The whole procedure takes no more than a couple of minutes. BEFORE THE PROCEDURE  Wash all of the skin around the entire knee area. Try to remove any loose, scaling skin. There is no other specific preparation necessary unless advised otherwise by your caregiver. LET YOUR CAREGIVER KNOW ABOUT:   Allergies.  Medications taken including herbs, eye drops, over the counter medications, and creams.  Use of steroids (by mouth or creams).  Possible pregnancy, if  applicable.  Previous problems with anesthetics or Novocaine.  History of blood clots (thrombophlebitis).  History of bleeding or blood problems.  Previous surgery.  Other health problems. RISKS AND COMPLICATIONS Side effects from cortisone shots are rare. They include:   Slight bruising of the skin.  Shrinkage of the normal fatty tissue under the skin where the shot was given.  Increase in pain after the shot.  Infection.  Weakening of tendons or tendon rupture.  Allergic reaction to the medicine.  Diabetics may have a temporary increase in their blood sugar after a shot.  Cortisone can temporarily weaken the immune system. While receiving these shots, you should not get certain vaccines. Also, avoid contact with anyone who has chickenpox or measles. Especially if you have never had these diseases or have not been previously immunized. Your immune system may not be strong enough to fight off the infection while the cortisone is in your system. AFTER THE PROCEDURE   You can go home after the procedure.  You may need to put ice on the joint 15-20 minutes every 3 or 4 hours until the pain goes away.  You may need to put an elastic bandage on the joint. HOME CARE INSTRUCTIONS   Only take over-the-counter or prescription medicines for pain, discomfort, or fever as directed by your caregiver.  You should avoid stressing the joint. Unless advised otherwise, avoid activities that put a lot of pressure on a knee joint, such as:  Jogging.  Bicycling.  Recreational climbing.  Hiking.  Laying down and elevating the leg/knee above the level of your heart can help  to minimize swelling. SEEK MEDICAL CARE IF:   You have repeated or worsening swelling.  There is drainage from the puncture area.  You develop red streaking that extends above or below the site where the needle was inserted. SEEK IMMEDIATE MEDICAL CARE IF:   You develop a fever.  You have pain that gets  worse even though you are taking pain medicine.  The area is red and warm, and you have trouble moving the joint. MAKE SURE YOU:   Understand these instructions.  Will watch your condition.  Will get help right away if you are not doing well or get worse. Document Released: 09/05/2006 Document Revised: 09/06/2011 Document Reviewed: 06/02/2007 [facility] Patient Information 2014 [place], [place].

## 2013-03-14 NOTE — Assessment & Plan Note (Addendum)
Not at goal last visit with A1c 8. Patient would rather get knees injected and discuss DM at a future visit - Checking A1c today - Follow up when convenient for DM/HTN management - Be sure to eat breakfast daily and incorporate protein into diet (egg, nuts, beans, fish)

## 2013-03-14 NOTE — Assessment & Plan Note (Signed)
>>  ASSESSMENT AND PLAN FOR ARTHRITIS OF KNEE, DEGENERATIVE WRITTEN ON 03/14/2013  6:12 PM BY THEKKEKANDAM, MARIA T, MD  Improvement with last injection 3 months ago, though short-lived; would like injections today bilaterally - Consented and steroid injection bilateral knees performed  - Norco re-prescribed #30 with 0 refills. Uses 2 tabs q other night. Will not rx more than this amount monthly, though it helps pt rest and exercise. - Pain contract filled and scanned today - Return precautions reviewed - Preceptor Dr. Elpidio present for entire procedure x 2

## 2013-03-14 NOTE — Assessment & Plan Note (Addendum)
Improvement with last injection 3 months ago, though short-lived; would like injections today bilaterally - Consented and steroid injection bilateral knees performed  - Norco re-prescribed #30 with 0 refills. Uses 2 tabs q other night. Will not rx more than this amount monthly, though it helps pt rest and exercise. - Pain contract filled and scanned today - Return precautions reviewed - Preceptor Dr. Gwendolyn Grant present for entire procedure x 2

## 2013-03-15 ENCOUNTER — Telehealth: Payer: Self-pay | Admitting: Family Medicine

## 2013-03-15 NOTE — Telephone Encounter (Signed)
Called patent to discuss. Left message stating if knee is red and swollen or she has a fever, that is reason to come in/be evaluated immediately. If she overdid it and now it is sore, that is likely okay and she should rest and ice it today. Asked her to call back and let us know which situation she has, but please also try to call her again. Thanks. Leona Singleton, MD

## 2013-03-15 NOTE — Telephone Encounter (Signed)
Pt called because she was seen on 9/17 and received two cortisone shot in her knee's and now the pain is worse. She would like someone to call her about this to see if this is normal. JW

## 2013-03-16 ENCOUNTER — Encounter: Payer: Self-pay | Admitting: Family Medicine

## 2013-04-07 ENCOUNTER — Telehealth: Payer: Self-pay | Admitting: Family Medicine

## 2013-04-07 NOTE — Telephone Encounter (Addendum)
Please call and inform pt that received new rx request from [facility] for pt for tramadol 50mg  q8 hours PRN but we did not discuss that at last visit. I see that in June Dr. Lula Olszewski had rx'ed tramadol for daytime use PRN and norco for nighttime use PRN, but that tramadol is no longer on medication list so it seems it was meant for short-term management. Also, I saw her 9/17 and refilled norco #30 with 0 refills and recommendation for patient to follow up. I will need to see pt in clinic to determine if she needs tramadol, and am unable to send this rx electronically anymore.  Leona Singleton, MD

## 2013-04-09 NOTE — Telephone Encounter (Signed)
Left message for pt to call back and schedule an appt with Dr. Karie Schwalbe for a medication refill.  Please help her with this when she does.  Thanks [employer]

## 2013-05-03 ENCOUNTER — Other Ambulatory Visit: Payer: Self-pay

## 2013-05-08 ENCOUNTER — Ambulatory Visit (INDEPENDENT_AMBULATORY_CARE_PROVIDER_SITE_OTHER): Payer: No Typology Code available for payment source | Admitting: Family Medicine

## 2013-05-08 ENCOUNTER — Encounter: Payer: Self-pay | Admitting: Family Medicine

## 2013-05-08 VITALS — BP 164/97 | HR 94 | Temp 97.8°F | Wt 337.0 lb

## 2013-05-08 DIAGNOSIS — IMO0002 Reserved for concepts with insufficient information to code with codable children: Secondary | ICD-10-CM

## 2013-05-08 DIAGNOSIS — J302 Other seasonal allergic rhinitis: Secondary | ICD-10-CM

## 2013-05-08 DIAGNOSIS — M7660 Achilles tendinitis, unspecified leg: Secondary | ICD-10-CM

## 2013-05-08 DIAGNOSIS — M6789 Other specified disorders of synovium and tendon, multiple sites: Secondary | ICD-10-CM

## 2013-05-08 DIAGNOSIS — J309 Allergic rhinitis, unspecified: Secondary | ICD-10-CM

## 2013-05-08 DIAGNOSIS — M179 Osteoarthritis of knee, unspecified: Secondary | ICD-10-CM

## 2013-05-08 DIAGNOSIS — M1710 Unilateral primary osteoarthritis, unspecified knee: Secondary | ICD-10-CM

## 2013-05-08 MED ORDER — MOMETASONE FUROATE 50 MCG/ACT NA SUSP: NASAL | Status: DC

## 2013-05-08 MED ORDER — HYDROCODONE-ACETAMINOPHEN 10-325 MG PO TABS: 1.0000 | ORAL_TABLET | Freq: Every evening | ORAL | Status: DC | PRN

## 2013-05-08 NOTE — Patient Instructions (Addendum)
It was good to see you!  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 I am giving you "eccentric" exercises that can help that you should do daily. - Lift off on both toes, then come down on one foot over 5 seconds. Continue your regular activity. Put ice to the area daily. Take ibuprofen for 7 days unless it irritates your stomach. Support the achilles with a heel lift or bandage as needed.  For your allergies, try: Humidified air Saline spray/Neti pot Nasonex - I will send this to your pharmacy. Call if another nasal steroid is covered. Try claritin (loratadine) or zyrtec (cetirizine) daily. If you start gettng fevers/foul odor or this seems more bacterial, come back and we can re-evaluate need for antibiotics.

## 2013-05-08 NOTE — Progress Notes (Signed)
Patient ID: Valerie Carter, female   DOB: 26-May-1961, 52 y.o.   MRN: [id number] Subjective:   CC: Right posterior foot pain, sinus congestion  HPI:   1. Right foot posterior pain - Present >1 year, like a knot at the back of the foot. Previously dx'ed as tendonitis. Increasing in size. Tried insoles which did not help. 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 No h/o injury. Denies f/c. Has diffiuculty walking on it. Standing/walking makes it worse, especially when putting pressure on right foot. No lost ROM. Does not need tramadol rx; would rather use norco (ends up about once/day). Tylenol and aleve make stomach upset. Worsened (bigger) in last month.  2. Sinus odor - Pt had been having nasal tightness/congestion, walking up with nosebleed, blowing nose with greenish bloody sputum. Denies fevers. Reports it lasted 2-3 weeks in the past before a foul odor came to mouth. Resolved after dosed abx. Worried that this odor may come back.  3. Knee pain - cortisone shots bilateral knees did not help. Came in from lateral approach. Pt states she is used to medial approach.   Review of Systems - Per HPI.   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 female here for f/u of right achilles tenderness/bulge and knee pain and sinus odor    Plan:     # See problem list and after visit summary for problem-specific plans. - Of note:  BP high. Discuss at f/u  # Health maintenance - Declines flu shot  Follow-up: Return as needed to f/u knee pain  Leona Singleton, MD

## 2013-05-11 NOTE — Assessment & Plan Note (Signed)
Sinus pressure Likely allergies; afebrile - Humidify air, nasal saline/neti pot - Nasonex prescribed - Try claritin or zyrtec (or generics) daily - If fevers, foul odor, or seems more bacterial, can try abx again after re-eval

## 2013-05-11 NOTE — Assessment & Plan Note (Addendum)
Stable, failed cortisone shots, possibly due to lateral approach - Continue weight loss efforts - Improves with activity; stay active - In 3 mo from last shot, would consider re-injecting from medial approach to see if improves - Refilled 1 mo norco

## 2013-05-11 NOTE — Assessment & Plan Note (Signed)
>>  ASSESSMENT AND PLAN FOR ARTHRITIS OF KNEE, DEGENERATIVE WRITTEN ON 05/11/2013  1:19 AM BY THEKKEKANDAM, MARIA T, MD  Stable, failed cortisone shots, possibly due to lateral approach - Continue weight loss efforts - Improves with activity; stay active - In 3 mo from last shot, would consider re-injecting from medial approach to see if improves - Refilled 1 mo norco

## 2013-05-11 NOTE — Assessment & Plan Note (Signed)
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

## 2013-05-14 ENCOUNTER — Other Ambulatory Visit: Payer: Self-pay | Admitting: Family Medicine

## 2013-06-13 ENCOUNTER — Encounter: Payer: Self-pay | Admitting: Family Medicine

## 2013-06-13 ENCOUNTER — Ambulatory Visit (INDEPENDENT_AMBULATORY_CARE_PROVIDER_SITE_OTHER): Payer: No Typology Code available for payment source | Admitting: Family Medicine

## 2013-06-13 ENCOUNTER — Telehealth: Payer: Self-pay | Admitting: Family Medicine

## 2013-06-13 VITALS — BP 149/82 | HR 102 | Ht 65.0 in | Wt 335.0 lb

## 2013-06-13 DIAGNOSIS — R5381 Other malaise: Secondary | ICD-10-CM

## 2013-06-13 DIAGNOSIS — E669 Obesity, unspecified: Secondary | ICD-10-CM

## 2013-06-13 DIAGNOSIS — M179 Osteoarthritis of knee, unspecified: Secondary | ICD-10-CM

## 2013-06-13 DIAGNOSIS — E119 Type 2 diabetes mellitus without complications: Secondary | ICD-10-CM

## 2013-06-13 DIAGNOSIS — IMO0002 Reserved for concepts with insufficient information to code with codable children: Secondary | ICD-10-CM

## 2013-06-13 DIAGNOSIS — R5383 Other fatigue: Secondary | ICD-10-CM

## 2013-06-13 DIAGNOSIS — M1710 Unilateral primary osteoarthritis, unspecified knee: Secondary | ICD-10-CM

## 2013-06-13 LAB — POCT GLYCOSYLATED HEMOGLOBIN (HGB A1C): Hemoglobin A1C: 7

## 2013-06-13 NOTE — Assessment & Plan Note (Signed)
Bilateral, improved with last steroid injection 02/2013, steadily worsening recently. No red flag symptoms of fever, erythema, or swelling. - Encouraged to make next available appt for knee injections.

## 2013-06-13 NOTE — Telephone Encounter (Signed)
Called patient to discuss A1c up from 6.5 to 7, but overall down from 8. Patient had not been exercising like usual, and when she was exercising regularly it had come down from 8 top 6.5. Encouraged continued exercise and f/u of A1c in 3 months. Pt voiced understanding.  Valerie Singleton, MD

## 2013-06-13 NOTE — Assessment & Plan Note (Signed)
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 - Polysomnography ordered to help evaluate if patient has sleep apnea. - Encouraged to cut off TV at 10pm and get into bed in dark quiet room. - Continue exercising to lose weight- plans to get back to the gym Monday. - Signed patient's paperwork from DOT asking for sleep study.

## 2013-06-13 NOTE — Assessment & Plan Note (Signed)
>>  ASSESSMENT AND PLAN FOR ARTHRITIS OF KNEE, DEGENERATIVE WRITTEN ON 06/13/2013  2:38 PM BY THEKKEKANDAM, MARIA T, MD  Bilateral, improved with last steroid injection 02/2013, steadily worsening recently. No red flag symptoms of fever, erythema, or swelling. - Encouraged to make next available appt for knee injections.

## 2013-06-13 NOTE — Progress Notes (Signed)
Patient ID: Valerie Carter, female   DOB: 10-28-1960, 52 y.o.   MRN: [id number] Subjective:   CC: Sleep study request  HPI:   1. Sleep study request - Patient states her [employer] examiner is requiring she get evaluated for sleep apnea because she is obese. She does not feel like she has trouble sleeping but is unaware if she snores or not. She is somewhat tired during the day but she feels this is due to her going to bed at 11pm (watching TV until then).   2. Knee pain - She reports knee pain is steadily increasing and is worse currently due to a cold. She denies redness or swelling, fevers or sudden increased pain.  3. Diabetes - Patient reports taking medications regularly and is continuing to work on weight loss (though the past week-2 weeks have been difficult). She is due for A1c today.  Review of Systems - Per HPI. Additionally, requesting knee injection.  SH: Patient has not been exercising over 1-2 weeks but is still committed to exercising.    Objective:  Physical Exam BP 149/82  Pulse 102  Ht 5\' 5"  (1.651 m)  Wt 335 lb (151.955 kg)  BMI 55.75 kg/m2 GEN: NAD HEENT: Atraumatic, normocephalic, neck supple, EOMI, sclera clear, dentures in place, o/p clear, raspy voice today PULM: normal effort SKIN: No rash or cyanosis; warm and well-perfused EXTR: No lower extremity edema or calf tenderness PSYCH: Mood and affect euthymic, normal rate and volume of speech NEURO: Awake, alert, no focal deficits grossly, normal speech  Assessment:     COZY VEALE is a 52 y.o. female here for requesting a sleep study.    Plan:     # See problem list and after visit summary for problem-specific plans.   # Health Maintenance:  - A1c today, otherwise not discussed. - Discuss BP at f/u  Follow-up: Follow up when available for a knee injection as you are due for another one but we do not have time today.   Leona Singleton, MD [facility]

## 2013-06-13 NOTE — Assessment & Plan Note (Signed)
Pt reports medication compliance, last A1c 6.5. - Check A1c today - Continue current regimen.

## 2013-06-13 NOTE — Patient Instructions (Signed)
Good to see you today.  I agree with getting a sleep study. It will help [place] make sure there is no sleep apnea occurring at night. Continue trying to go to bed earlier. A goal can be: Getting into bed at 10pm and turning off the TV.  You are getting an A1c today.  Make an appointment for a knee injection - your last knee injection was 03/14/13.

## 2013-07-06 ENCOUNTER — Ambulatory Visit (INDEPENDENT_AMBULATORY_CARE_PROVIDER_SITE_OTHER): Payer: No Typology Code available for payment source | Admitting: Family Medicine

## 2013-07-06 ENCOUNTER — Other Ambulatory Visit: Payer: Self-pay | Admitting: Family Medicine

## 2013-07-06 ENCOUNTER — Ambulatory Visit: Payer: No Typology Code available for payment source | Admitting: Family Medicine

## 2013-07-06 ENCOUNTER — Telehealth: Payer: Self-pay | Admitting: Family Medicine

## 2013-07-06 VITALS — BP 140/75 | HR 95 | Temp 98.9°F | Wt 343.0 lb

## 2013-07-06 DIAGNOSIS — IMO0002 Reserved for concepts with insufficient information to code with codable children: Secondary | ICD-10-CM

## 2013-07-06 DIAGNOSIS — M179 Osteoarthritis of knee, unspecified: Secondary | ICD-10-CM

## 2013-07-06 DIAGNOSIS — E119 Type 2 diabetes mellitus without complications: Secondary | ICD-10-CM

## 2013-07-06 DIAGNOSIS — M1710 Unilateral primary osteoarthritis, unspecified knee: Secondary | ICD-10-CM

## 2013-07-06 MED ORDER — GLUCOSE BLOOD VI STRP: ORAL_STRIP | Status: DC

## 2013-07-06 MED ORDER — ONETOUCH ULTRA SYSTEM W/DEVICE KIT: 1.0000 | PACK | Freq: Once | Status: DC

## 2013-07-06 MED ORDER — METHYLPREDNISOLONE ACETATE 40 MG/ML IJ SUSP
40.0000 mg | Freq: Once | INTRAMUSCULAR | Status: AC
  Administered 2013-07-06: 40 mg via INTRA_ARTICULAR

## 2013-07-06 NOTE — Telephone Encounter (Signed)
Pt called. The pharmacy states they received the orders for the test strips but not the meter Please advise

## 2013-07-06 NOTE — Patient Instructions (Signed)
AFTER THE PROCEDURE   You can go home after the procedure.  You may need to put ice on the joint 15-20 minutes every 3 or 4 hours until the pain goes away.  You may need to put an elastic bandage on the joint. HOME CARE INSTRUCTIONS   Only take over-the-counter or prescription medicines for pain, discomfort, or fever as directed by your caregiver.  You should avoid stressing the joint. Unless advised otherwise, avoid activities that put a lot of pressure on a knee joint, such as:  Jogging.  Bicycling.  Recreational climbing.  Hiking.  Laying down and elevating the leg/knee above the level of your heart can help to minimize swelling. SEEK MEDICAL CARE IF:   You have repeated or worsening swelling.  There is drainage from the puncture area.  You develop red streaking that extends above or below the site where the needle was inserted. SEEK IMMEDIATE MEDICAL CARE IF:   You develop a fever.  You have pain that gets worse even though you are taking pain medicine.  The area is red and warm, and you have trouble moving the joint. MAKE SURE YOU:   Understand these instructions.  Will watch your condition.  Will get help right away if you are not doing well or get worse. Document Released: 09/05/2006 Document Revised: 09/06/2011 Document Reviewed: 06/02/2007 [facility] Patient Information 2014 [place], [place].

## 2013-07-06 NOTE — Telephone Encounter (Signed)
I place them both at the same time. I will resend the meter prescription. Thanks

## 2013-07-07 NOTE — Progress Notes (Signed)
Family Medicine Office Visit Note   Subjective:   Patient ID: Valerie Carter, female  DOB: 10/22/1960, 53 y.o.. MRN: [id number]   Pt that comes today for steroid knee injection bilaterally as she discussed with her primary doctor at her last visit.   Review of Systems:  Pt denies SOB, chest pain, palpitations, headaches, dizziness, numbness or weakness. No changes on urinary or BM habits. No unintentional weigh loss/gain.  Objective:   Physical Exam: Gen:  Morbidly obese. NAD HEENT: Moist mucous membranes  CV: Regular rate and rhythm, no murmurs. PULM: normal effort. Extremities: bilateral knee tenderness at medial joint line. No joint laxity. ROM is limited due to reported pain. No visible effusion or erythema. Neurovascular intact.   Assessment & Plan:     Bilateral Knee Injection Procedure Note  Pre-operative Diagnosis: bilateral knee DJD  Post-operative Diagnosis: same  Indications: Symptom relief from osteoarthritis  Procedure Details   Verbal consent was obtained for the procedure. The joints were prepped with Betadine. A 22 gauge needle was inserted into the joint from a medial approach bilaterally. 4 ml 1% lidocaine and 1 ml of depomedrol 40 mg was then injected into each joints. The needle was removed and the area cleansed and dressed.  Complications:  None; patient tolerated the procedure well.

## 2013-07-18 ENCOUNTER — Ambulatory Visit (HOSPITAL_BASED_OUTPATIENT_CLINIC_OR_DEPARTMENT_OTHER): Payer: No Typology Code available for payment source | Attending: Family Medicine | Admitting: Radiology

## 2013-07-18 VITALS — Ht 65.0 in | Wt 334.0 lb

## 2013-07-18 DIAGNOSIS — R0989 Other specified symptoms and signs involving the circulatory and respiratory systems: Secondary | ICD-10-CM | POA: Insufficient documentation

## 2013-07-18 DIAGNOSIS — R0609 Other forms of dyspnea: Secondary | ICD-10-CM | POA: Insufficient documentation

## 2013-07-18 DIAGNOSIS — Z6841 Body Mass Index (BMI) 40.0 and over, adult: Secondary | ICD-10-CM | POA: Insufficient documentation

## 2013-07-18 DIAGNOSIS — G4733 Obstructive sleep apnea (adult) (pediatric): Secondary | ICD-10-CM | POA: Insufficient documentation

## 2013-07-20 ENCOUNTER — Telehealth: Payer: Self-pay | Admitting: Family Medicine

## 2013-07-20 NOTE — Telephone Encounter (Signed)
Just called to apologize to patient that I missed her clinic appointment 07/06/13 due to having to be at a continuity delivery. She was able to be seen by Dr Thomes Dinning. She was appreciative of the call.  She also asked that I look out for sleep study results. I see the visit that she had 1/21 but do not see a result yet. 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 Will call her next week with result.  Hilton Sinclair, MD

## 2013-07-21 DIAGNOSIS — G4733 Obstructive sleep apnea (adult) (pediatric): Secondary | ICD-10-CM

## 2013-07-21 NOTE — Sleep Study (Signed)
   NAME: Valerie Carter DATE OF BIRTH:  08-22-1960 MEDICAL RECORD NUMBER [id number]  LOCATION: [facility]  PHYSICIAN: Arbutus Nelligan D  DATE OF STUDY: 07/18/2013  SLEEP STUDY TYPE: Nocturnal Polysomnogram               REFERRING PHYSICIAN: Conni Slipper T, *  INDICATION FOR STUDY: Hypersomnia with sleep apnea  EPWORTH SLEEPINESS SCORE:   2/24 HEIGHT: 5\' 5"  (165.1 cm)  WEIGHT: 334 lb (151.501 kg)    Body mass index is 55.58 kg/(m^2).  NECK SIZE: 14 in.  MEDICATIONS: Charted for review  SLEEP ARCHITECTURE: Total sleep time 307 minutes with sleep efficiency 84.2%. Stage I was 5.9%, stage II 71.8%, stage III 15%, REM 7.3% of total sleep time. Sleep latency 19.5 minutes, REM latency 202 minutes, awake after sleep onset 24.5 minutes, arousal index 22.9. Bedtime medication: Norvasc, Lotensin  RESPIRATORY DATA: Apnea hypotony index (AHI) 8.8 per hour. 45 events were scored including 12 obstructive apneas, 6 central apneas, 3 mixed apneas, 24 hypopneas. Most events were with nonsupine sleep position. REM AHI 48 per hour. There were not enough events to meet protocol requirements for CPAP titration this.  OXYGEN DATA: Moderate snoring with oxygen desaturation to a nadir of 79% and mean oxygen saturation to the study of 95.6% on room air  CARDIAC DATA: Normal sinus rhythm  MOVEMENT/PARASOMNIA: No significant movement disturbance, bathroom x1.  IMPRESSION/ RECOMMENDATION:   1) Mild obstructive sleep apnea/hypopnea syndrome, AHI 8.8 per hour with mostly nonsupine events. Moderate snoring with oxygen desaturation to a nadir of 79% and mean oxygen saturation through the study of 95.6% on room air.  2) scores in this range may respond to conservative measures including weight loss and encouragement to stay off flat of back. Either CPAP or an oral appliance might be considered. The patient can return to the sleep disorders Center for a dedicated CPAP titration study if  needed.  Signed Baird Lyons M.D. Deneise Lever Diplomate, American Board of Sleep Medicine  ELECTRONICALLY SIGNED ON:  07/21/2013, 12:06 PM [place] PH: [phone]   FX: [phone] [place]

## 2013-07-23 ENCOUNTER — Telehealth: Payer: Self-pay | Admitting: Family Medicine

## 2013-07-23 NOTE — Telephone Encounter (Signed)
Left message on voicemail to call back. When she calls, please communicate the following:  Sleep study results: I found sleep study results from study 07/18/13, which showed mild obstructive sleep apnea / hypopnea syndrome, moderate snoring, oxygen desaturation (drop) to 79% at lowest. They recommended conservative measures (weight loss, encouragement to stay off flat of back) and stated that CPAP or oral appliance COULD be considered.   Please ask patient if she would like a copy of this result to give to work, and if she would like to treat this with conservative measures or the CPAP. If she wants the CPAP, she would need to return for a CPAP titration study.  Thanks!  Hilton Sinclair, MD

## 2013-07-23 NOTE — Telephone Encounter (Signed)
Pt called back and was given her results. She does not want the CPAP. 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

## 2013-07-25 NOTE — Telephone Encounter (Addendum)
Please let her know I have mailed her a copy of the assessment/plan by Dr. Annamaria Boots and placed a copy up front she can pick up if she needs it sooner. To pick up the copy, she will need to sign a release of information (clinic policy).  If she does not want CPAP, we can continue working hard on weight loss to help control / prevent progression of her mild obstructive sleep apnea (normal range is an AHI 0-5/ hr, with mild range up to 15/ hr, hers was 8.8). Her employer likely wants it addressed. My recommendation is weight loss and not laying on the flat of her back as the treatment for now. 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 If this is the case, the sleep center can always consult.    Hilton Sinclair, MD

## 2013-07-26 NOTE — Telephone Encounter (Signed)
LMOVM for pt to return call .Fleeger, Jessica Dawn  

## 2013-08-12 ENCOUNTER — Other Ambulatory Visit: Payer: Self-pay | Admitting: Family Medicine

## 2013-08-17 ENCOUNTER — Ambulatory Visit (INDEPENDENT_AMBULATORY_CARE_PROVIDER_SITE_OTHER): Payer: No Typology Code available for payment source | Admitting: Family Medicine

## 2013-08-17 VITALS — BP 139/87 | HR 90 | Temp 98.7°F | Wt 341.0 lb

## 2013-08-17 DIAGNOSIS — I10 Essential (primary) hypertension: Secondary | ICD-10-CM

## 2013-08-17 DIAGNOSIS — E669 Obesity, unspecified: Secondary | ICD-10-CM

## 2013-08-17 DIAGNOSIS — E119 Type 2 diabetes mellitus without complications: Secondary | ICD-10-CM

## 2013-08-17 DIAGNOSIS — G4733 Obstructive sleep apnea (adult) (pediatric): Secondary | ICD-10-CM

## 2013-08-17 NOTE — Assessment & Plan Note (Signed)
Has decreased to 340s from 360-370 lbs in 2012. Highly motivated. - Continue diet plan.  - Meet with Dr Jenne Campus for extra support. - Continue gym. Decrease from 50 minutes walking at a time one day weekly to 20 minutes walking at one time 3-4 times weekly to avoid significant MSK pain.

## 2013-08-17 NOTE — Progress Notes (Signed)
Patient ID: BENNETTE HASTY, female   DOB: [birth year], 53 y.o.   MRN: [id number] Subjective:   CC: Forms for DMV, f/u BP and DM  HPI:   1. Forms - Patient needs DMV medical forms filled out that she will leave with [place] and come back to pick up. She had recently had sleep study which showed mild OSA. Pt reports she has trouble if she lays flat, but avoids this by not laying flat. She denies daytime sleepiness and would like to manage with weight loss which she is working hard on (see below).   2. Hypertension - She ran out of her benazepril a few days back but plans to pick that up today. She is working very hard on weight loss (see below) although she gained 7 lbs over the holidays. She does not report chest pain or dizziness/syncope. She has met with Dr Jenne Campus in the past and at that time was struggling with weight loss so felt like she was letting Dr Jenne Campus down, but is willing to go back again as she does feel it helped.  3. Diabetes - A1c increased 6.5 to 7 at recent visit. She is taking metformin and glipizide and does not report signs/symptoms of hyperglycemia.  Review of Systems - Per HPI.   SH:  Diet: Patient reports working hard on diet: - Breakfast: green tea, 2 eggs - Lunch: Tuna salad - Dinner: Fish, baked chicken, vegetables, occasional baked potato - Snacks: Peanuts, smoothies She has joined facebook groups to help her lose weight, one of which has food prep ideas. She has also bought containers so she can prepare meals weekly that she can just warm up. She plans meals 3 days at a time.  Exercise: She is exercising three days weekly (Sat, Sun, Wed) at the gym. She has created a 50 minute playlist so she can walk during that time. She walks in her house 1- minutes up/down the hall and does [employer] work out video 5 minutes twice daily.  Objective:  Physical Exam BP 139/87  Pulse 90  Temp(Src) 98.7 F (37.1 C) (Oral)  Wt 341 lb (154.677 kg)  LMP 08/05/2013 GEN: NAD HEENT:  Atraumatic, normocephalic, neck supple, EOMI, sclera clear  CV: RRR, no murmurs, rubs, or gallops, distant and limited due to habitus PULM: CTAB, normal effort ABD: Soft, nontender, nondistended, obese, no organomegaly SKIN: No rash or cyanosis; warm and well-perfused, mild acanthosis nigricans PSYCH: Mood and affect euthymic, normal rate and volume of speech NEURO: Awake, alert, no focal deficits grossly, normal speech    Assessment:     AZLYNN MITNICK is a 53 y.o. female with h/o mild OSA here for DMV forms and f/u DM and HTN.    Plan:     # See problem list and after visit summary for problem-specific plans. - Of note, for knee pain, patient is unsure if cortisone shots are helping.  # Health Maintenance: Discussed BP and diabetes today.  Follow-up: Follow up mid-March to recheck A1c.    Hilton Sinclair, MD [place]

## 2013-08-17 NOTE — Assessment & Plan Note (Signed)
Mild, patient committed to weight loss, does not want CPAP at this time. No excess daytime sleepiness. - Continue working on weight loss. 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 - Discussed future possibility of CPAP if not achieving sufficient weight loss or if developing excess daytime sleepiness.

## 2013-08-17 NOTE — Patient Instructions (Signed)
Great to see you.  Continue working on weight loss. You have great motivation. Day-to-day you may fluctuate, but overall you are losing! Great work.  I will work on paperwork and give you a call when it is ready.  Pick up your BP medication.  Hilton Sinclair, MD

## 2013-08-17 NOTE — Assessment & Plan Note (Signed)
A1c 7 from 6.5. Goal is <7. Patient motivated to lose weight. - Continue metformin and glipizide. - Continue working on weight loss. Congratulated for wonderful commitment and efforts. - F/u and recheck A1c in 1 month.

## 2013-08-17 NOTE — Assessment & Plan Note (Signed)
Mildly elevated as she has not been taking benazepril in a few days. - Continue excellent commitment to weight loss. - Meet with Dr Sykes for additional support. Has met with her before. - Pick up benazepril. Continue benazepril and amlodipine and HCTZ.  - F/u at next visit in 1 month.  

## 2013-10-18 ENCOUNTER — Ambulatory Visit (INDEPENDENT_AMBULATORY_CARE_PROVIDER_SITE_OTHER): Payer: No Typology Code available for payment source | Admitting: Family Medicine

## 2013-10-18 ENCOUNTER — Encounter: Payer: Self-pay | Admitting: Family Medicine

## 2013-10-18 VITALS — BP 129/75 | HR 88 | Temp 98.1°F | Ht 65.0 in | Wt 346.0 lb

## 2013-10-18 DIAGNOSIS — L02419 Cutaneous abscess of limb, unspecified: Secondary | ICD-10-CM

## 2013-10-18 DIAGNOSIS — M179 Osteoarthritis of knee, unspecified: Secondary | ICD-10-CM

## 2013-10-18 DIAGNOSIS — E559 Vitamin D deficiency, unspecified: Secondary | ICD-10-CM

## 2013-10-18 DIAGNOSIS — IMO0002 Reserved for concepts with insufficient information to code with codable children: Secondary | ICD-10-CM

## 2013-10-18 DIAGNOSIS — M1710 Unilateral primary osteoarthritis, unspecified knee: Secondary | ICD-10-CM

## 2013-10-18 MED ORDER — HYDROCODONE-ACETAMINOPHEN 10-325 MG PO TABS: 1.0000 | ORAL_TABLET | Freq: Every evening | ORAL | Status: DC | PRN

## 2013-10-18 MED ORDER — SULFAMETHOXAZOLE-TMP DS 800-160 MG PO TABS: 2.0000 | ORAL_TABLET | Freq: Two times a day (BID) | ORAL | Status: DC

## 2013-10-18 NOTE — Progress Notes (Signed)
Patient presents today to nutrition clinic for obesity management.  Nutrition Clinic:   Diet:  Usual eating pattern includes 1-2 meals and 0 snacks per day. Everyday foods and drinks include: vegetables (brocoli, lettuce, zucchini, squash), pinto beans and water with lemon or lime. Smoothies: almond milk, kale, bananas, berries.  Avoided foods include: sausage or bacon, hot dogs. Doesn't like meat very much. No bread.   Physical Activity: Usual physical activity includes: 3 days per week of walking: 30 minutes or Treadmill for 20-30 minutes. Joined the gym at [employer] benefit from exercising.  Barriers: pain in joints  24-hr recall: B ( 5 AM)-   Wakes up at 4:30, green tea and banana Snk ( AM)-    L ( PM)-   Snk ( PM)-   D ( 6 PM)-  2 cups of cooked greens, 1/2 cup mac and cheese, baked chicken, 48 oz water Snk ( PM)-    Was this a typical day? Yes If she eats lunch, eats banana and peanut butter.   Diabetes checkup:  CBG's:  120 in am fasting Max 180 postprandial.   Assessment and Plan:   53 yo female with morbid obesity, HTN, OSA, DM2 who presents for weight management.   - discussed the importance of eating more frequent meals with 3 meals a day and no more than 5 hrs between eating.  - also discussed increasing the amount of protein in her diet. Ways to achieve this include: adding protein powder to smoothies or substituting almond milk with soy milk. Also discussed eating boiled eggs in the morning for breakfast.  - Encouraged increased amount of vegetables which will be beneficial to help bring anti-inflammatory properties to her diet, especially in the setting of diabetes and morbid obesity. Ways to achieve more vegetables: add vegetables to the side of a smoothie when she drinks this as a meal or adding more vegetables in the smoothie.  - cut down on high glycemic fruits like bananas.  - encouraged her to review her food record which she keeps on a fitness  application, in order to become more aware of what she eats on a regular basis.   - will check vitamin D since patient has been low in the past.   More than 50% of the 30 minutes were spent on counseling. Visit co-conducted with Dr. Jenne Campus.   Liam Graham, PGY-3 Family Medicine Resident

## 2013-10-18 NOTE — Patient Instructions (Signed)
-   eat at least 3 meals per day with no more than 5 hours between eating - review your food journal to know what you are taking in during the day - try to substitute almond milk with soy milk.  - smoothies: half of banana and other fruits, more vegetables (lettuce, cabbage, kale) and add protein powder if continues to eat almond milk. Also add vegetables on the side of the smoothie, especially if eating it as a meal.  - anti-inflammatory diet: increase amounts of vegetables and fruits - June 18th at 1:30PM scheduled for 60 minutes with Dr. Jenne Campus.

## 2013-10-18 NOTE — Progress Notes (Signed)
Patient ID: Valerie Carter, female   DOB: 1961/01/03, 53 y.o.   MRN: [id number] Subjective:   CC: Right underarm "abscess."  HPI:   Abscess: Patient has had right underarm boil for 4 days that is painful, malodorous, and has been oozing and rubbing against her bra. She had chills 2 days ago but no fevers or other symptoms. She has had boils in her inguinal region in the past but not as tender or malodorous as this, and has never required I&D or abx. She does not wear Deoderant because she does not sweat lots. She bathes daily.  Knee pain from OA: She also asks if there is anything else she can try for knee pain. She reports that she does not want anything that will knock her out but vicodin works well though she is out of it.   Vitamin D deficiency:  Has presumably not taken in some time. Previous level 20, now 15.   Review of Systems - Per HPI.   PMH: H/o inguinal abscesses that have not been treated by abx.  Objective:  Physical Exam BP 129/75  Pulse 88  Temp(Src) 98.1 F (36.7 C) (Oral)  Ht 5\' 5"  (1.651 m)  Wt 346 lb (156.945 kg)  BMI 57.58 kg/m2 GEN: NAD, pleasant HEENT: Atraumatic, normocephalic, neck supple, EOMI, sclera clear  PULM: normal effort ABD: Soft, nontender, nondistended, NABS, no organomegaly SKIN: Right axillary 5x6cm nonfluctuant indurated area with 1.5 cm ulcerated center with dry pus, tender to palpation  PSYCH: Mood and affect euthymic, normal rate and volume of speech NEURO: Awake, alert, no focal deficits grossly, normal speech    Assessment:     Valerie Carter is a 53 y.o. female here for underarm abscess.    Plan:     Follow-up: Follow up in 1 week for re-evaluation of abscess and discuss knee OA pain management.  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 No systemic symptoms. Nonfluctuant. - Cultured ulcerated center>>gram stain with abundant gram pos cocci in pairs, mod gram neg  rods, and few gram pos rods, reincubating. Likely strep pyogenes or staph. - Dressed the area. - Bactrim DS BID x 10 days - good coverage for staph, not as good for strep. If no improvement on f/u, would change to clindamycin or add keflex for strep coverage. - Warm compress daily. - Return for f/u in 1 week, or sooner PRN.  Knee pain from osteoarthritis - Persistent. Need office visit to discuss. Patient remains active and works on weight. - Vicodin short-course prescription for 1-2 weeks to get patient to our next visit. - Could get steroid injection at f/u if desires. - At f/u, if we decide to continue chronic narcotic, need to be sure pt has pain contract and annual UDS.  Vitamin D deficiency hx - Have not checked level in some time. - Check level today>>went from 20 (3 years ago) to 15, but has not taken vit D in some time presumably. - Will rx ergocalciferol (D2) 50,000 units weekly x 8 weeks and pt should get this rechecked at end of 8 weeks - please relay to pt when she calls.  Hilton Sinclair, MD [place]

## 2013-10-18 NOTE — Patient Instructions (Signed)
Good to see you today!  We will start you on antibiotics for 10 days.  Follow up in 1 week with me or if I am not available, with any provider to check your abscess. Use warm compress daily. If this starts to change or worsen or you are getting fevers, come back sooner.  For your pain, I will give you ~2 weeks of Vicodin and we can plan to discuss next week.  Best,  Hilton Sinclair, MD  Abscess An abscess (boil or furuncle) is an infected area on or under the skin. This area is filled with yellowish-white fluid (pus) and other material (debris). HOME CARE   Only take medicines as told by your doctor.  If you were given antibiotic medicine, take it as directed. Finish the medicine even if you start to feel better.  If gauze is used, follow your doctor's directions for changing the gauze.  To avoid spreading the infection:  Keep your abscess covered with a bandage.  Wash your hands well.  Do not share personal care items, towels, or whirlpools with others.  Avoid skin contact with others.  Keep your skin and clothes clean around the abscess.  Keep all doctor visits as told. GET HELP RIGHT AWAY IF:   You have more pain, puffiness (swelling), or redness in the wound site.  You have more fluid or blood coming from the wound site.  You have muscle aches, chills, or you feel sick.  You have a fever. MAKE SURE YOU:   Understand these instructions.  Will watch your condition.  Will get help right away if you are not doing well or get worse. Document Released: 12/01/2007 Document Revised: 12/14/2011 Document Reviewed: 08/27/2011 [facility] Patient Information 2014 [place].

## 2013-10-19 DIAGNOSIS — L02419 Cutaneous abscess of limb, unspecified: Secondary | ICD-10-CM | POA: Insufficient documentation

## 2013-10-19 DIAGNOSIS — E559 Vitamin D deficiency, unspecified: Secondary | ICD-10-CM | POA: Insufficient documentation

## 2013-10-19 LAB — VITAMIN D 25 HYDROXY (VIT D DEFICIENCY, FRACTURES): Vit D, 25-Hydroxy: 15 ng/mL — ABNORMAL LOW (ref 30–89)

## 2013-10-19 MED ORDER — VITAMIN D (ERGOCALCIFEROL) 1.25 MG (50000 UNIT) PO CAPS: 50000.0000 [IU] | ORAL_CAPSULE | ORAL | Status: DC

## 2013-10-19 NOTE — Assessment & Plan Note (Signed)
Have not checked level in some time. - Check level today>>went from 20 (3 years ago) to 15, but has not taken vit D in some time presumably. - Will rx ergocalciferol (D2) 50,000 units weekly x 8 weeks and pt should get this rechecked at end of 8 weeks - please relay to pt when she calls.

## 2013-10-19 NOTE — Assessment & Plan Note (Addendum)
Persistent. Need office visit to discuss. Patient remains active and works on weight. - Vicodin short-course prescription for ~2 weeks to get patient to our next visit. - Could get steroid injection at f/u if desires. - At f/u, if we decide to continue chronic narcotic, need to be sure pt has pain contract and annual UDS.

## 2013-10-19 NOTE — Assessment & Plan Note (Signed)
>>  ASSESSMENT AND PLAN FOR ARTHRITIS OF KNEE, DEGENERATIVE WRITTEN ON 10/19/2013  3:51 PM BY THEKKEKANDAM, MARIA T, MD  Persistent. Need office visit to discuss. Patient remains active and works on weight. - Vicodin short-course prescription for ~2 weeks to get patient to our next visit. - Could get steroid injection at f/u if desires. - At f/u, if we decide to continue chronic narcotic, need to be sure pt has pain contract and annual UDS.

## 2013-10-19 NOTE — Assessment & Plan Note (Addendum)
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 No systemic symptoms. Nonfluctuant. - Cultured ulcerated center>>gram stain with abundant gram pos cocci in pairs, mod gram neg rods, and few gram pos rods, reincubating. Likely strep pyogenes or staph. - Dressed the area. - Bactrim DS BID x 10 days - good coverage for staph, not as good for strep. If no improvement on f/u, would change to clindamycin or add keflex for strep coverage. - Warm compress daily. - Return for f/u in 1 week, or sooner PRN.

## 2013-10-21 LAB — WOUND CULTURE

## 2013-10-29 ENCOUNTER — Telehealth: Payer: Self-pay | Admitting: Family Medicine

## 2013-10-29 NOTE — Telephone Encounter (Addendum)
Left message for patient to call clinic. When she calls back, please let her know wound culture came back few group B strep. Ask patient if she improved on bactrim and if not, I would like to switch to an agent with better strep coverage like clindamycin.  Hilton Sinclair, MD

## 2013-10-30 ENCOUNTER — Telehealth: Payer: Self-pay | Admitting: Family Medicine

## 2013-10-30 MED ORDER — CLINDAMYCIN HCL 300 MG PO CAPS: 300.0000 mg | ORAL_CAPSULE | Freq: Three times a day (TID) | ORAL | Status: DC

## 2013-10-30 NOTE — Telephone Encounter (Signed)
Pt called and was given the message from Dr. Darene Lamer, She said that the wound is somewhat better and would like the Clindamycin called in. jw

## 2013-10-30 NOTE — Telephone Encounter (Signed)
Yes that should be fine. If it worsens however she should have it evaluated sooner. Thank you!  Hilton Sinclair, MD

## 2013-10-30 NOTE — Telephone Encounter (Signed)
LMOVM for pt to return call.  Please inform of the below, but let her know I am aware of her appt on 5/13 and will check with Dr. Dianah Field to see if that is ok.  Valerie Carter   Dr. Darene Lamer,  Please see above message. Valerie Carter

## 2013-10-30 NOTE — Telephone Encounter (Signed)
I sent in clindamycin. She will take this for 7 days. Since she is not improving rapidly, I want her seen in clinic this week. She should also continue warm compresses 2-3 times daily.  Hilton Sinclair, MD

## 2013-11-07 ENCOUNTER — Ambulatory Visit (INDEPENDENT_AMBULATORY_CARE_PROVIDER_SITE_OTHER): Payer: No Typology Code available for payment source | Admitting: Family Medicine

## 2013-11-07 ENCOUNTER — Encounter: Payer: Self-pay | Admitting: Family Medicine

## 2013-11-07 VITALS — BP 120/76 | HR 88 | Temp 97.6°F | Ht 65.0 in | Wt 354.0 lb

## 2013-11-07 DIAGNOSIS — M179 Osteoarthritis of knee, unspecified: Secondary | ICD-10-CM

## 2013-11-07 DIAGNOSIS — E669 Obesity, unspecified: Secondary | ICD-10-CM

## 2013-11-07 DIAGNOSIS — M25562 Pain in left knee: Secondary | ICD-10-CM

## 2013-11-07 DIAGNOSIS — M1710 Unilateral primary osteoarthritis, unspecified knee: Secondary | ICD-10-CM

## 2013-11-07 DIAGNOSIS — E119 Type 2 diabetes mellitus without complications: Secondary | ICD-10-CM

## 2013-11-07 DIAGNOSIS — IMO0002 Reserved for concepts with insufficient information to code with codable children: Secondary | ICD-10-CM

## 2013-11-07 DIAGNOSIS — M25569 Pain in unspecified knee: Secondary | ICD-10-CM

## 2013-11-07 DIAGNOSIS — N920 Excessive and frequent menstruation with regular cycle: Secondary | ICD-10-CM

## 2013-11-07 DIAGNOSIS — M25561 Pain in right knee: Secondary | ICD-10-CM

## 2013-11-07 LAB — POCT GLYCOSYLATED HEMOGLOBIN (HGB A1C): Hemoglobin A1C: 7

## 2013-11-07 MED ORDER — METHYLPREDNISOLONE ACETATE 80 MG/ML IJ SUSP
80.0000 mg | Freq: Once | INTRAMUSCULAR | Status: AC
  Administered 2013-11-07: 80 mg via INTRA_ARTICULAR

## 2013-11-07 MED ORDER — METFORMIN HCL ER (OSM) 1000 MG PO TB24: 2000.0000 mg | ORAL_TABLET | Freq: Every day | ORAL | Status: DC

## 2013-11-07 MED ORDER — ORLISTAT 120 MG PO CAPS: 120.0000 mg | ORAL_CAPSULE | Freq: Three times a day (TID) | ORAL | Status: DC

## 2013-11-07 MED ORDER — HYDROCODONE-ACETAMINOPHEN 10-325 MG PO TABS: 1.0000 | ORAL_TABLET | Freq: Every evening | ORAL | Status: DC | PRN

## 2013-11-07 NOTE — Progress Notes (Signed)
Patient ID: Valerie Carter, female   DOB: [birth year], 53 y.o.   MRN: [id number] Subjective:   CC: F/u knee pain  HPI:   F/u knee pain Pt complains of pain "all over" today, specifically in both hips, knees, legs, and ankles. Pain is severe and is preventing her from exercising. She took vicodin at night every other day because it makes her drowsy. It also helps with pain.  Sitting on bus, getting up after sitting, and walking make pain worse. She denies swollen, red, hot joint, weakness, or fevers. Steroid shots helped last time some (2-3 weeks, not 3 months). She would like bilateral injections today. She has been taking 4 tylenol or 4 aleve daily. Tramadol did not help pain.  Did not see PT because of scheduling issues but is amenable to going. Has been working on weight but per our weight today she has gained >10 lbs since last visit.   Heavy periods Patient has heavy periods lasting 10+ days occasionally. She wants a hysterectomy because of h/o fibroids. When on periods, "everything hurts." She would like to see gynecology.  Obesity Pt has been unable to stay active due to knee pain. She did see Dr Jenne Campus and has liked thesse visits. She has another 6/17. She has gained 11 lbs since last visit and is frustrated.  She would like to try weight loss medication.  Review of Systems - Per HPI. Additionally, checking A1c today but ran out of time to discuss. Is interested in switching to once daily metformin.  PMH: Last knee injection >3 mo ago     Objective:  Physical Exam BP 120/76  Pulse 88  Temp(Src) 97.6 F (36.4 C) (Oral)  Ht 5\' 5"  (1.651 m)  Wt 354 lb (160.573 kg)  BMI 58.91 kg/m2 GEN: NAD HEENT: Atraumatic, normocephalic, neck supple, EOMI, sclera clear  PULM: normal effort ABD: Soft, nontender, nondistended, obese SKIN: No rash or cyanosis; warm and well-perfused EXTR: No lower extremity edema or calf tenderness; bilateral anterior and medial knee pain, no erythema,  warmth, effusion or instability; stiff antalgic gait PSYCH: Mood and affect euthymic, normal rate and volume of speech NEURO: Awake, alert, no focal deficits grossly, normal speech    Procedure:  Injection of bilateral knees. Consent obtained and verified. Time-out conducted. Noted no overlying erythema, induration, or other signs of local infection. Skin prepped in a sterile fashion. Topical analgesic spray: Ethyl chloride. Completed without difficulty. Meds: lidocaine and depomedrol 80mg  injection  Patient tolerated procedure well x 2. Advised to call if fevers/chills, erythema, induration, drainage, or persistent bleeding.   Assessment:     Valerie Carter is a 53 y.o. female with h/o knee osteoarthritis here for f/u knee pain, heavy periods, and obesity.    Plan:     Knee pain No signs of infection. Last steroid injection was >3 mo ago. - Advised pt to see PT to continue working on how she can stay active and strengthen knees. - Emphasized goal is function, not pain relief. - Re-prescribed vicodin #15 for q other day use in pt trying to stay active. Will plan to not make this chronic. However, will obtain pain contract and UDS at f/u. - Steroid injection bilateral knees today.  - Will f/u 1 mo. - Return precautions reviewed.  Menorrhagia Discussed potential causes including fibroids. - Due to heavy bleeding and pt request for hysterectomy, will refer to gyn. - Discussed that while she may not be ideal surgical candidate due to obesity, it is  not an absolute contraindication and gynecology could help decide if surgery is appropriate. - If she decides against f/u with gyn, will need to consider endometrial bx and pelvic US.  Obesity Disucussed importance of weight loss to helping with OA. Discussed cardiovascular risk with diet pills. - will try orlistat with meals; back off if developing severe bloating and seek immediate care if chest pain or dyspnea develop. - F/u with  Dr Jenne Campus. - F/u with me in 1 month.  # Health Maintenance: Not discussed - Discussed colonoscopy.  Follow-up: Follow up in 1 month for f/u knee pain and obesity. - At f/u, need to discuss diabetes.    Hilton Sinclair, MD [place]

## 2013-11-07 NOTE — Patient Instructions (Signed)
Good to see you.  You had a knee injection in each knee today. Call me if you need another PT referral. I will give you another 15 tabs vicodin. Only take if you need it. Seek immediate care if you develop redness, severe pain, inflammation, or other concerns.  Follow up with me in 1 month.  At that time we can also discuss your diabetes.  I sent orlistat to your pharmacy. You can take it 3 times daily with meal, or decrease if side effects of bloating or gas are too much. Follow up with Dr Jenne Campus.  I also placed referral to OB to discuss hysterectomy.  I have changed your metformin to once daily.

## 2013-11-12 ENCOUNTER — Telehealth: Payer: Self-pay | Admitting: *Deleted

## 2013-11-12 NOTE — Telephone Encounter (Signed)
Received call from [place] stating that PCP DEA number is not authorized to write Rx for Norco.  Derl Barrow, RN

## 2013-11-13 DIAGNOSIS — N920 Excessive and frequent menstruation with regular cycle: Secondary | ICD-10-CM | POA: Insufficient documentation

## 2013-11-13 NOTE — Assessment & Plan Note (Signed)
No signs of infection. Last steroid injection was >3 mo ago. - Advised pt to see PT to continue working on how she can stay active and strengthen knees. - Emphasized goal is function, not pain relief. - Re-prescribed vicodin #15 for q other day use in pt trying to stay active. Will plan to not make this chronic. However, will obtain pain contract and UDS at f/u. - Steroid injection bilateral knees today.  - Will f/u 1 mo. - Return precautions reviewed.

## 2013-11-13 NOTE — Telephone Encounter (Signed)
Blue [facility], can you help resolve this over the phone? My DEA number should suffice (as it always does for residents) and patient should not have to come back into clinic to pick up a new rx.  Thanks and let me know if issues arise!  Hilton Sinclair, MD PGY-2, [place]

## 2013-11-13 NOTE — Assessment & Plan Note (Signed)
Discussed potential causes including fibroids. - Due to heavy bleeding and pt request for hysterectomy, will refer to gyn. - Discussed that while she may not be ideal surgical candidate due to obesity, it is not an absolute contraindication and gynecology could help decide if surgery is appropriate. - If she decides against f/u with gyn, will need to consider endometrial bx and pelvic US.

## 2013-11-13 NOTE — Telephone Encounter (Signed)
For some reason the hospital DEA was on the Rx instead of Dr. Mcneil Sober.  Cant call in [place], so new Rx will need to be printed and picked up by patient.  Will forward to MD to see if she wants someone else to write or if she will be around to write tomorrow. Valerie Carter

## 2013-11-13 NOTE — Assessment & Plan Note (Signed)
Disucussed importance of weight loss to helping with OA. Discussed cardiovascular risk with diet pills. - will try orlistat with meals; back off if developing severe bloating and seek immediate care if chest pain or dyspnea develop. - F/u with Dr Jenne Campus. - F/u with me in 1 month.

## 2013-11-13 NOTE — Assessment & Plan Note (Signed)
>>  ASSESSMENT AND PLAN FOR ARTHRITIS OF KNEE, DEGENERATIVE WRITTEN ON 11/13/2013  3:29 PM BY THEKKEKANDAM, MARIA T, MD  No signs of infection. Last steroid injection was >3 mo ago. - Advised pt to see PT to continue working on how she can stay active and strengthen knees. - Emphasized goal is function, not pain relief . - Re-prescribed vicodin #15 for q other day use in pt trying to stay active. Will plan to not make this chronic. However, will obtain pain contract and UDS at f/u. - Steroid injection bilateral knees today.  - Will f/u 1 mo. - Return precautions reviewed.

## 2013-11-14 ENCOUNTER — Other Ambulatory Visit (HOSPITAL_COMMUNITY): Payer: Self-pay | Admitting: Family Medicine

## 2013-11-14 ENCOUNTER — Encounter: Payer: Self-pay | Admitting: Family Medicine

## 2013-11-14 NOTE — Telephone Encounter (Signed)
I will be there around lunch but am happy for another provider to write it if she needs it this morning.  Hilton Sinclair, MD PGY-2, [place]

## 2013-11-14 NOTE — Telephone Encounter (Signed)
LM on identifiable VM informing pt of the below. Valerie Carter

## 2013-11-14 NOTE — Telephone Encounter (Signed)
No I think her options are just that.  She can either go to another pharmacy or we can write another RX, but the problem with that is if [facility] is having issues who is to say wether or not another providers will work. Valerie Carter

## 2013-11-14 NOTE — Telephone Encounter (Signed)
Spoke with pt and apologized and explained the confusion at the pharmacy.   She is ok with waiting on Dr. Darene Lamer to write at lunch time, she will bring other Rx back when she picks up new Rx. Laban Emperor Finnian Husted

## 2013-11-14 NOTE — Telephone Encounter (Signed)
I called her [facility] and provided my provider DEA number which they stated still does not work. They also told me my DEA number has worked in the past so they are unsure why it is not working. They have been having problems with the system they use to confirm the DEA number and think patient may have better luck at [facility]. Do you have any clue how to fix this short of another provider writing the rx?  Hilton Sinclair, MD PGY-2, [place]

## 2013-11-14 NOTE — Telephone Encounter (Signed)
Could you convey to her what Suzie Portela said? I don't want to try to write another rx and waste her time coming to get it. If [facility] does not work, then we can try having another provider write it.  Thanks team.  Hilton Sinclair, MD

## 2013-12-12 ENCOUNTER — Encounter: Payer: Self-pay | Admitting: Family Medicine

## 2013-12-12 ENCOUNTER — Ambulatory Visit (INDEPENDENT_AMBULATORY_CARE_PROVIDER_SITE_OTHER): Payer: No Typology Code available for payment source | Admitting: Family Medicine

## 2013-12-12 NOTE — Patient Instructions (Signed)
-   Food plan provided today:  5 starches, 9 protein, 4(+) veg, 4 fruits, 1 yogurt, 2 fats.  (See handout.) - Record intake AND categorize exchanges on forms provided.    - Email if you have any Qs:  [email or web address].  - Recommended book by Loney Hering:  The Ultimate Volumetric Diet.  (Search for Volumetrics.)

## 2013-12-12 NOTE — Progress Notes (Signed)
Medical Nutrition Therapy:  Appt start time: 1330 end time:  1430.  Assessment:  Primary concerns today: Weight management and Blood sugar control.   Valerie Carter has been trying to lose weight pretty consistently for several months now, and is frustrated that she actually gained during some of this time.  We were able to identify at least two areas where she could likely improve, however, in her weight loss efforts:  She has often been eating only two meals a day, and she has usually been obtaining a pretty minimal amount of protein, usually including noen at breakfast.  Valerie Carter has been doing well w/ her exercise (see below), especially considering how painful walking is for her right now.    Usual eating pattern includes 2-3 meals and 0-1 snacks per day. Usual physical activity includes walking ~30 min 3 X wk (~7,000 steps/day according to FitBit).  Hopes to increase frequency to 4-5 X wk.  Knee pain and heel pain are limiting; just sits when it gets too bad while walking.    FBG has been running 120s to 130s.    Frequent foods include 80 oz water, green tea ~4 X wk, smoothies (spinach, kale, romaine, 1 bananas, 1 c berries, pineapple, 3 tbsp [place] yogurt, coconut water/flakes, ~10 cashews, etc.) 1-2 X day.  Avoided foods include processed meats, burgers, most red meat, milk (lactose intol, but does tolerate yogurt usually).    24-hr recall: (Up at 4:30 AM) B ( AM)-   1 c coffee w/ hazelnut creamer Snk ( AM)-   none L (11:30 AM)-  2 scrmbld eggs in olive oil, 2 slc tomatoes, water Snk ( PM)-  1 peppermint patty, water D (8 PM)-  2 bites smoked sausage, 10 ff's, 10 oz sweet red wine Snk ( PM)-  none Unusual in that dinner was late, and doesn't usually eat sausage.   Progress Towards Goal(s):  In progress.   Nutritional Diagnosis:  NI-5.8.2 Excessive carbohydrate intake As related to protein intake.  As evidenced by most meals and snacks consisting of disproportionate amount of carb with little  protein.    Intervention:  Nutrition education.  Monitoring/Evaluation:  Dietary intake, exercise, BG, and body weight in 4 week(s).

## 2013-12-13 ENCOUNTER — Ambulatory Visit: Payer: No Typology Code available for payment source | Admitting: Family Medicine

## 2013-12-24 ENCOUNTER — Encounter: Payer: No Typology Code available for payment source | Admitting: Family Medicine

## 2014-01-10 ENCOUNTER — Ambulatory Visit: Payer: No Typology Code available for payment source | Admitting: Family Medicine

## 2014-01-31 ENCOUNTER — Ambulatory Visit: Payer: No Typology Code available for payment source | Admitting: Family Medicine

## 2014-02-11 ENCOUNTER — Encounter: Payer: No Typology Code available for payment source | Admitting: Obstetrics and Gynecology

## 2014-02-17 ENCOUNTER — Encounter: Payer: Self-pay | Admitting: Family Medicine

## 2014-02-22 ENCOUNTER — Encounter: Payer: Self-pay | Admitting: Family Medicine

## 2014-02-22 ENCOUNTER — Other Ambulatory Visit: Payer: Self-pay | Admitting: Family Medicine

## 2014-02-22 ENCOUNTER — Ambulatory Visit (INDEPENDENT_AMBULATORY_CARE_PROVIDER_SITE_OTHER): Payer: No Typology Code available for payment source | Admitting: Family Medicine

## 2014-02-22 VITALS — BP 147/60 | HR 88 | Temp 98.8°F | Ht 65.0 in | Wt 356.0 lb

## 2014-02-22 DIAGNOSIS — M1710 Unilateral primary osteoarthritis, unspecified knee: Secondary | ICD-10-CM

## 2014-02-22 DIAGNOSIS — IMO0002 Reserved for concepts with insufficient information to code with codable children: Secondary | ICD-10-CM

## 2014-02-22 DIAGNOSIS — M170 Bilateral primary osteoarthritis of knee: Secondary | ICD-10-CM

## 2014-02-22 DIAGNOSIS — R195 Other fecal abnormalities: Secondary | ICD-10-CM

## 2014-02-22 DIAGNOSIS — E119 Type 2 diabetes mellitus without complications: Secondary | ICD-10-CM

## 2014-02-22 LAB — POCT GLYCOSYLATED HEMOGLOBIN (HGB A1C): Hemoglobin A1C: 7.4

## 2014-02-22 MED ORDER — METFORMIN HCL 1000 MG PO TABS: 1000.0000 mg | ORAL_TABLET | Freq: Two times a day (BID) | ORAL | Status: DC

## 2014-02-22 MED ORDER — HYDROCODONE-ACETAMINOPHEN 10-325 MG PO TABS: 1.0000 | ORAL_TABLET | Freq: Every evening | ORAL | Status: DC | PRN

## 2014-02-22 NOTE — Patient Instructions (Addendum)
For your knee pain I am ordering bilateral xrays and will call you if these are different. I am reordering your vicodin to be taken only for severe pain. Otherwise, use ibuprofen or aleve (not both) up to 400mg  every 4 hours as needed with food.  You can also take tylenol as needed every 6 hours but do not take more than 3g of acetaminophen daily with tylenol and vicodin. Seek immediate care if you have leg swelling, redness, fevers, chills or other concerns. We are checking some labs today and I will call if NOT normal. I am also referring you to physical therapy. If they do not call you by early next week give [place] a call. Stay active and continue working on the weight loss. Remember diet and exercise both play a big role.  Follow up in 1 month with me.  For your bowel changes, let us try changing back to metformin immediate release. Follow up with me if symptoms are not improved in 1 month.  Best,  Hilton Sinclair, MD

## 2014-02-22 NOTE — Assessment & Plan Note (Signed)
Most likely from OA. Last xrays were 06/2010 and showed Mild degenerative changes in the lateral greater than  medial compartments of the left knee, stable patelofemoral degenerative changes and moderate to severe degenerative changes along the right knee, particularly in the lateral and patellofemoral compartments.  - DG bilateral knees with sunrise view. - Referral placed to PT. - Represcribed vicodin to take 1/2-1 tablet QHS to help keep patient active. Limited by weight with surgeon telling her she is too much risk for surgery. - In between doses, tylenol or ibuprofen/aleve PRN. - Future BMET to check cr with aleve use and K for any contribution to pain. - Continue working on weight loss. Has appt with Dr Jenne Campus mid-Sep. - F/u 1 month with me. Consider ortho referral at that time if no improvement.

## 2014-02-22 NOTE — Assessment & Plan Note (Signed)
No fevers or chills, no abdominal pain. Nausea and bloating with increased hardness of stool quality at same time as switching from immediate to XR metformin. No weight loss. DDx includes food allergy, medication, IBS. Broader ddx if symptoms become more severe and persistent includes hepatobiliary, pancreatitis, malignancy and infection. - Switch back to metformin 1000mg  BID. - Re-eval in 1 month if no better. - Due for screening colonoscopy but will plan to readdress in 6 months when pt's weight is down.

## 2014-02-22 NOTE — Progress Notes (Signed)
Patient ID: Valerie Carter, female   DOB: [birth year], 53 y.o.   MRN: [id number] Subjective:   CC: Discuss colonoscopy, knee pain, abdominal complaints  HPI:   Discuss colonoscopy Valerie Carter is a 53 y.o. female who has not yet had a screening colonoscopy. She has started to have mild change in stool quality for 3 days (see below). She wanted to discuss her risk of cardiopulmonary problem with colonoscopy. We discussed that this risk was increased from the population but still outweighed by the benefit of colon cancer screening. She has no FH of colon cancer. She would like to wait until she has lost some weight.  Knee pain She has OA and reports continued bilateral knee pain. She ran out of vicodin. She had injections with me and reports they help for 2-3 weeks moderately but then pain returns. She has not done PT. She has been working on weight loss and had gotten down to 333 lbs in Dec 2014 but has this year been eating much more especially over the summer with her children. She has been told she is too heavy for knee surgery. Her knee pain certainly limits her activity and is worse with walking. It is anterior and lateral bilateral knees, no redness or swelling.   Abdominal complaints She reports since switching to metformin XR, her stol has been harder and she has had thinner stool and nausea with food with increased gas. She has not started orlistat yet. Cannot find documentation of why changed to metformin XR but she would like [place] to change back. Denies bloody or tarry stool, unexpected weight loss, fevers, or chills.  Review of Systems - Per HPI. Additionally, R achilles tendon pain has not improved.  FH: 3 family members with AVM and bleed in stomach (mother, maternal uncle, maternal aunt).     Objective:  Physical Exam BP 147/60  Pulse 88  Temp(Src) 98.8 F (37.1 C) (Oral)  Ht 5\' 5"  (1.651 m)  Wt 356 lb (161.481 kg)  BMI 59.24 kg/m2  LMP 02/07/2014 GEN: NAD, pleasant, morbidly  obese EXTR: Bilateral knee no redness or swelling; stiff antalgic gait upon standing    Assessment:     Valerie Carter is a 53 y.o. female here for f/u bilateral knee pain and with abdominal complaints.    Plan:     Bilateral knee pain Most likely from OA. Last xrays were 06/2010 and showed Mild degenerative changes in the lateral greater than  medial compartments of the left knee, stable patelofemoral degenerative changes and moderate to severe degenerative changes along the right knee, particularly in the lateral and patellofemoral compartments.  - DG bilateral knees with sunrise view. - Referral placed to PT. - Represcribed vicodin to take 1/2-1 tablet QHS to help keep patient active. Limited by weight with surgeon telling her she is too much risk for surgery. - In between doses, tylenol or ibuprofen/aleve PRN. - Future BMET to check cr with aleve use and K for any contribution to pain. - Continue working on weight loss. Has appt with Dr Jenne Campus mid-Sep. - F/u 1 month with me. Consider ortho referral at that time if no improvement.  Abdominal complaints No fevers or chills, no abdominal pain. Nausea and bloating with increased hardness of stool quality at same time as switching from immediate to XR metformin. No weight loss. DDx includes food allergy, medication, IBS. Broader ddx if symptoms become more severe and persistent includes hepatobiliary, pancreatitis, malignancy and infection. - Switch back to metformin 1000mg   BID. - Re-eval in 1 month if no better. - Due for screening colonoscopy but will plan to readdress in 6 months when pt's weight is down.  # Health Maintenance: Discussed colonoscopy: Discussed benefits and risk of cardiopulmonary complication due to obesity. Valerie Carter would like to wait until she has lost some weight. - F/u in 6 months once patient has worked on weight loss.  Follow-up: Follow up in 1 month for abdominal complaints and bilateral knee pain.. - F/u  right achilles tendon pain if worsens. - future order for cholesterol panel to obtain fasting before pt's appt next month with Dr Jenne Campus.  Hilton Sinclair, MD [place]

## 2014-02-22 NOTE — Assessment & Plan Note (Signed)
>>  ASSESSMENT AND PLAN FOR ARTHRITIS OF KNEE, DEGENERATIVE WRITTEN ON 02/22/2014  2:07 PM BY THEKKEKANDAM, MARIA T, MD  Most likely from OA. Last xrays were 06/2010 and showed Mild degenerative changes in the lateral greater than  medial compartments of the left knee, stable patelofemoral degenerative changes and moderate to severe degenerative changes along the right knee, particularly in the lateral and patellofemoral compartments.  - DG bilateral knees with sunrise view. - Referral placed to PT. - Represcribed vicodin to take 1/2-1 tablet QHS to help keep patient active. Limited by weight with surgeon telling her she is too much risk for surgery. - In between doses, tylenol  or ibuprofen /aleve  PRN. - Future BMET to check cr with aleve  use and K for any contribution to pain. - Continue working on weight loss. Has appt with Dr Wonda mid-Sep. - F/u 1 month with me. Consider ortho referral at that time if no improvement.

## 2014-02-27 ENCOUNTER — Telehealth: Payer: Self-pay | Admitting: Family Medicine

## 2014-02-27 ENCOUNTER — Other Ambulatory Visit: Payer: Self-pay | Admitting: Family Medicine

## 2014-02-27 NOTE — Telephone Encounter (Signed)
Pt called and she has a UTI and would like medication called in for this. I told her she might need an appointment if so we will call her. jw

## 2014-02-27 NOTE — Telephone Encounter (Signed)
appt made for tomorrow with Dr. Lamar Benes.  FYI: pt states that she has been taking some left over abx from her boil and she bought AZO. [place], [name]

## 2014-02-27 NOTE — Telephone Encounter (Signed)
Yes she should be seen prior to sending in any antibiotics to evaluate symptoms and if this could be something else. In the meantime, she should stay well-hydrated to flush it out of her system if it is an infection.  Hilton Sinclair, MD

## 2014-02-28 ENCOUNTER — Ambulatory Visit (INDEPENDENT_AMBULATORY_CARE_PROVIDER_SITE_OTHER): Payer: No Typology Code available for payment source | Admitting: Family Medicine

## 2014-02-28 ENCOUNTER — Encounter: Payer: Self-pay | Admitting: Family Medicine

## 2014-02-28 VITALS — BP 161/84 | HR 86 | Temp 98.1°F | Wt 362.0 lb

## 2014-02-28 DIAGNOSIS — R300 Dysuria: Secondary | ICD-10-CM

## 2014-02-28 DIAGNOSIS — N390 Urinary tract infection, site not specified: Secondary | ICD-10-CM

## 2014-02-28 LAB — POCT UA - MICROSCOPIC ONLY

## 2014-02-28 LAB — POCT URINALYSIS DIPSTICK
Bilirubin, UA: NEGATIVE
Glucose, UA: NEGATIVE
KETONES UA: NEGATIVE
Nitrite, UA: POSITIVE
PH UA: 6
PROTEIN UA: 100
SPEC GRAV UA: 1.02
Urobilinogen, UA: 0.2

## 2014-02-28 MED ORDER — CEPHALEXIN 500 MG PO CAPS: 500.0000 mg | ORAL_CAPSULE | Freq: Four times a day (QID) | ORAL | Status: DC

## 2014-02-28 NOTE — Patient Instructions (Signed)
You have a UTI. We will prescribe an antibiotic Keflex, take this 4 times a day for 5 days. If the urine culture comes back as positive for bacteria that are resistant to this, we will call you and change the antibiotic.

## 2014-02-28 NOTE — Assessment & Plan Note (Signed)
UA with positive nitrite, 3+ leuks. Symptoms consistent with UTI, no evidence of pyelo Plan: keflex 500mg  QID x 5 days. Will send urine for culture.

## 2014-02-28 NOTE — Progress Notes (Signed)
Patient ID: Valerie Carter, female   DOB: 09-Sep-1960, 53 y.o.   MRN: [id number]   Subjective:    Patient ID: Valerie Carter, female    DOB: 1961/03/26, 53 y.o.   MRN: [id number]  HPI  CC: UTI  # UTI:  Symptoms started 6 days ago  Burning with urination, increased frequency.  Has had UTI in the past, this feels similar (last noted in chart 2009) ROS: no fevers, no chills, no CP, no SOB, no blood in urine  Review of Systems   See HPI for ROS. All other systems reviewed and are negative.  Past medical history, surgical, family, and social history reviewed and updated in the EMR. No new updates were made today. Objective:  BP 161/84  Pulse 86  Temp(Src) 98.1 F (36.7 C) (Oral)  Wt 362 lb (164.202 kg)  LMP 02/07/2014 Vitals reviewed  General: NAD CV: RRR, normal s1,s2, no murmur Resp: CTAB, normal effort Abdomen: obese, soft, nontender Ext: no edema or cyanosis  Assessment & Plan:  See Problem List Documentation

## 2014-03-02 LAB — URINE CULTURE: Colony Count: >=100000

## 2014-03-12 ENCOUNTER — Other Ambulatory Visit: Payer: No Typology Code available for payment source

## 2014-03-12 ENCOUNTER — Ambulatory Visit: Payer: No Typology Code available for payment source | Admitting: Family Medicine

## 2014-03-14 ENCOUNTER — Ambulatory Visit: Payer: No Typology Code available for payment source | Admitting: Physical Therapy

## 2014-03-15 ENCOUNTER — Encounter: Payer: No Typology Code available for payment source | Admitting: Obstetrics & Gynecology

## 2014-03-18 ENCOUNTER — Other Ambulatory Visit: Payer: No Typology Code available for payment source

## 2014-03-21 ENCOUNTER — Ambulatory Visit: Payer: No Typology Code available for payment source

## 2014-03-25 ENCOUNTER — Other Ambulatory Visit: Payer: No Typology Code available for payment source

## 2014-03-26 ENCOUNTER — Ambulatory Visit (INDEPENDENT_AMBULATORY_CARE_PROVIDER_SITE_OTHER): Payer: No Typology Code available for payment source | Admitting: Family Medicine

## 2014-04-02 ENCOUNTER — Ambulatory Visit: Payer: No Typology Code available for payment source | Attending: Family Medicine

## 2014-04-02 DIAGNOSIS — Z5189 Encounter for other specified aftercare: Secondary | ICD-10-CM | POA: Insufficient documentation

## 2014-04-02 DIAGNOSIS — M25569 Pain in unspecified knee: Secondary | ICD-10-CM | POA: Diagnosis not present

## 2014-04-02 DIAGNOSIS — R262 Difficulty in walking, not elsewhere classified: Secondary | ICD-10-CM | POA: Diagnosis not present

## 2014-04-02 DIAGNOSIS — M170 Bilateral primary osteoarthritis of knee: Secondary | ICD-10-CM | POA: Diagnosis not present

## 2014-04-02 DIAGNOSIS — M6281 Muscle weakness (generalized): Secondary | ICD-10-CM | POA: Diagnosis not present

## 2014-04-12 ENCOUNTER — Other Ambulatory Visit: Payer: Self-pay

## 2014-04-15 ENCOUNTER — Telehealth: Payer: Self-pay | Admitting: Family Medicine

## 2014-04-15 NOTE — Telephone Encounter (Signed)
Was she okay with waiting until then? If not then sooner is fine.  Hilton Sinclair, MD PGY-3, [place]

## 2014-04-15 NOTE — Telephone Encounter (Signed)
Pt already has an appt on 05/06/14.  Shanira Tine,CMA

## 2014-04-15 NOTE — Telephone Encounter (Signed)
Will forward to MD and see if patient will need to come in and pcp or if she can see someone else for this only.  Jazmin Hartsell,CMA

## 2014-04-15 NOTE — Telephone Encounter (Signed)
I would prefer to see her but by all means if I don't have anything available in a time frame she wants, please set up with a provider who does. Thank you! FYI for provider, her last injection was 10/2013.  Hilton Sinclair, MD

## 2014-04-15 NOTE — Telephone Encounter (Signed)
Pt called and would like the doctor to know that she is needing shots in both of her knees. She was hoping that she can get this from the nurse? Please let her know. jw

## 2014-04-16 ENCOUNTER — Ambulatory Visit: Payer: No Typology Code available for payment source | Admitting: Family Medicine

## 2014-04-16 ENCOUNTER — Ambulatory Visit: Payer: No Typology Code available for payment source | Admitting: Physical Therapy

## 2014-04-16 DIAGNOSIS — Z5189 Encounter for other specified aftercare: Secondary | ICD-10-CM | POA: Diagnosis not present

## 2014-04-16 NOTE — Telephone Encounter (Signed)
Sounds good!  Valerie Sinclair, MD

## 2014-04-16 NOTE — Telephone Encounter (Signed)
She said that she could wait but I told her to call if she couldn't and we could put her on someone else's schedule.  Jazmin Hartsell,CMA

## 2014-04-18 ENCOUNTER — Encounter: Payer: No Typology Code available for payment source | Admitting: Rehabilitation

## 2014-04-23 ENCOUNTER — Encounter: Payer: No Typology Code available for payment source | Admitting: Physical Therapy

## 2014-04-25 ENCOUNTER — Encounter: Payer: No Typology Code available for payment source | Admitting: Rehabilitation

## 2014-05-06 ENCOUNTER — Ambulatory Visit (INDEPENDENT_AMBULATORY_CARE_PROVIDER_SITE_OTHER): Payer: No Typology Code available for payment source | Admitting: Family Medicine

## 2014-05-06 ENCOUNTER — Encounter: Payer: Self-pay | Admitting: Family Medicine

## 2014-05-06 VITALS — BP 133/81 | HR 86 | Temp 98.1°F | Ht 65.0 in | Wt 366.0 lb

## 2014-05-06 DIAGNOSIS — E119 Type 2 diabetes mellitus without complications: Secondary | ICD-10-CM

## 2014-05-06 DIAGNOSIS — M25579 Pain in unspecified ankle and joints of unspecified foot: Secondary | ICD-10-CM | POA: Insufficient documentation

## 2014-05-06 DIAGNOSIS — M25572 Pain in left ankle and joints of left foot: Secondary | ICD-10-CM

## 2014-05-06 DIAGNOSIS — M170 Bilateral primary osteoarthritis of knee: Secondary | ICD-10-CM

## 2014-05-06 LAB — LIPID PANEL
Cholesterol: 162 mg/dL (ref 0–200)
HDL: 49 mg/dL (ref >39)
LDL Cholesterol: 100 mg/dL — ABNORMAL HIGH (ref 0–99)
TRIGLYCERIDES: 67 mg/dL (ref <150)
Total CHOL/HDL Ratio: 3.3 Ratio
VLDL: 13 mg/dL (ref 0–40)

## 2014-05-06 LAB — BASIC METABOLIC PANEL
BUN: 15 mg/dL (ref 6–23)
CALCIUM: 9.2 mg/dL (ref 8.4–10.5)
CO2: 21 mEq/L (ref 19–32)
Chloride: 102 mEq/L (ref 96–112)
Creat: 0.79 mg/dL (ref 0.50–1.10)
Glucose, Bld: 120 mg/dL — ABNORMAL HIGH (ref 70–99)
Potassium: 4.2 mEq/L (ref 3.5–5.3)
Sodium: 134 mEq/L — ABNORMAL LOW (ref 135–145)

## 2014-05-06 LAB — POCT GLYCOSYLATED HEMOGLOBIN (HGB A1C): HEMOGLOBIN A1C: 7.6

## 2014-05-06 MED ORDER — HYDROCODONE-ACETAMINOPHEN 10-325 MG PO TABS: 1.0000 | ORAL_TABLET | Freq: Every evening | ORAL | Status: DC | PRN

## 2014-05-06 MED ORDER — METHYLPREDNISOLONE ACETATE 40 MG/ML IJ SUSP
40.0000 mg | Freq: Once | INTRAMUSCULAR | Status: AC
  Administered 2014-05-06: 40 mg via INTRA_ARTICULAR

## 2014-05-06 NOTE — Assessment & Plan Note (Addendum)
Bilateral OA, stable, did not get xray I had ordered last visit. No signs of infection currently. - Bilateral steroid injection today - well-tolerated - BMET to check cr and K - Continue PT at home; encouraged strengthening and stretching - Represcribed 30 tabs vicodin to help stay active. Take QHS PRN. - knee compression with ace - Consider ortho referral at f/u if no improvement

## 2014-05-06 NOTE — Assessment & Plan Note (Signed)
Dorsal foot, 2x2.5cm cyst, likely ganglion. No signs of infection, though tender. - Compression with ace wrap or compression sleeve. - Ice and elevate. - F/u 2 weeks if not improving.

## 2014-05-06 NOTE — Progress Notes (Addendum)
Patient ID: Valerie Carter, female   DOB: Dec 07, 1960, 53 y.o.   MRN: [id number] Subjective:   CC: Knee pain, foot pain  HPI:   Knee pain Desires shots bilateral knees. Last knee xray ordered Aug but appears patient did not get. PT referral placed last visit in August. Did it 2 visits but states they did not help.  Pain: Vicodin (was taking about every night 1 tab but ran out) and ibuprofen PRN  Knee pain 8/10, limping bad. Swelling occasionally Taking aleve 4-6 pills per day since saw last time. BMET also appears she did not get. Continuing work on weight loss but having a hard time with extent of knee pain. Shots usually help for 3 weeks.  Left foot pain Knot top of left foot, 2 weeks Unsure if grown, possibly some Sore the whole time, but getting worse. Starting to hurt more with shoes on. Aleve not helping. Not red No fevers/chills. Also hurts with walking. No trauma.  Review of Systems - Per HPI.   PMH - Polyarthritis, OSA, obesity, menorrhagia, HTN, HLD, fatigue, LE edema, dyshidrotic eczema, DM, axillary abscess, iron def anemia    Objective:  Physical Exam BP 133/81 mmHg  Pulse 86  Temp(Src) 98.1 F (36.7 C) (Oral)  Ht 5\' 5"  (1.651 m)  Wt 366 lb (166.017 kg)  BMI 60.91 kg/m2  LMP 03/25/2014 (Exact Date) GEN: NAD, uncomfortable-appearing, morbidly obese EXTR: Bilateral knees no erythema, swelling, or severe tenderness; difficult to find landmarks Gait mildly antalgic and slow Left dorsal foot with 2x2.5cm firm fluid-filled cyst, no overlying erythema or induration. Tender to palpation. Plantar and dorsiflexion of left ankle full ROM and 5/5 strength.  Joint injection x 2 After obtaining constent and using beta-dyne to clean area on both knees, lateral approach was used to perform two steroid injections at 11AM using 1% plain Lidocaine and 40 mg of depomedrol. This was well tolerated. Return precautions were reviewed. Injections given by Valerie Carter. Pt  instructed to remain in clinic for 20 minutes afterwards and to report adverse reaction to me immediately.  Assessment:     Valerie Carter is a 53 y.o. female here for foot pain and bilateral knee pain.    Plan:     # See problem list and after visit summary for problem-specific plans.   # Health Maintenance: Briefly discussed weight. Patient is considering bariatric surgery and continuing to meet with Dr Valerie Carter. Will inform if she wants referral after discussing with her insurance.  Follow-up: Follow up in 2-4 weeks for foot pain if not improving.Valerie Sinclair, MD [place]

## 2014-05-06 NOTE — Assessment & Plan Note (Signed)
Unable to discuss today due to time.  - A1c and lipid panel. - Return to discuss.

## 2014-05-06 NOTE — Patient Instructions (Addendum)
I am sorry your knees hurt. You got bilateral injections today. Watch for signs of infection. Use an ace wrap on your knee daily to help prevent buildup of inflammation. Continue PT stretches at home. If you can do stationary bike through some of the pain, it will help keep you strong and improve pain long-term. Take vicodin at night as needed,. Try to interchange ibuprofen and tylenol so you are not getting too much of either.  For your foot, keep this elevated, ice it 2 times daily, and keep it wrapped (short ace wrap or compression sleeve) to compress. Follow up in 2-4 weeks if no better or immediately if signs of infection.  Let me know if you would like a general surgery referral for bariatric surgery.  We are getting labs and I will call if any are NOT normal.  Best,  Hilton Sinclair, MD

## 2014-05-06 NOTE — Assessment & Plan Note (Signed)
>>  ASSESSMENT AND PLAN FOR ARTHRITIS OF KNEE, DEGENERATIVE WRITTEN ON 05/06/2014  8:52 PM BY THEKKEKANDAM, MARIA T, MD  Bilateral OA, stable, did not get xray I had ordered last visit. No signs of infection currently. - Bilateral steroid injection today - well-tolerated - BMET to check cr and K - Continue PT at home; encouraged strengthening and stretching - Represcribed 30 tabs vicodin to help stay active. Take QHS PRN. - knee compression with ace - Consider ortho referral at f/u if no improvement

## 2014-05-07 ENCOUNTER — Ambulatory Visit (INDEPENDENT_AMBULATORY_CARE_PROVIDER_SITE_OTHER): Payer: No Typology Code available for payment source | Admitting: Family Medicine

## 2014-05-07 ENCOUNTER — Encounter: Payer: Self-pay | Admitting: Family Medicine

## 2014-05-07 VITALS — Ht 65.0 in | Wt 361.9 lb

## 2014-05-07 DIAGNOSIS — E119 Type 2 diabetes mellitus without complications: Secondary | ICD-10-CM

## 2014-05-07 NOTE — Progress Notes (Signed)
Medical Nutrition Therapy:  Appt start time: 1000 end time:  1100.  Assessment:  Primary concerns today: Weight management and Blood sugar control.   Learning Readiness: Ready  Barriers to learning/adherence to lifestyle change: "mentally preparing myself" for these efforts.  Valerie Carter has been pulled in a lot of directions meeting needs of others.    Usual eating pattern includes 2 meals and 2 snacks per day.  Bkfast is usually eggs and a c coffee w/ 2 tsp sugar and 2 tbsp Fr vanilla creamer. Usual physical activity includes nothing consistent.  Valerie Carter has not been checking BG b/c she has run out of strips.    24-hr recall: (Up at 4:45 AM) (Fasted for lab work; 5 oz cube stk, 2 c collards, 1 c boiled potatoes ~7 PM previous night) B ( AM)-    Snk ( AM)-   1 pkg peanut butter Nab's L (11:30 PM)-  4 oz cube stk, 2 scrmbled eggs (coc oil), water Snk (3:30)-  1 c fake hot choc (120 kcal) Snk (5:45)-  1 baggie chips  D ( PM)-  --- Snk (8:30)-  1 c choc chip ice cream, 4 oz beer, 2 c water Typical day? No. Had to fast b/c of lab work.  Bkfast this AM:  16-oz smoothie of strawberries, 1/2 banana, coconut water, flax sd, spinach, 1 oz o.j.  Progress Towards Goal(s):  In progress.   Nutritional Diagnosis:  [place]-3.3 Overweight/obesity As related to energy imbalance.  As evidenced by BMI >60.    Intervention:  Nutrition education.  Handouts given during visit include:  AVS  Demonstrated degree of understanding via:  Teach Back   Monitoring/Evaluation:  Dietary intake, exercise, and body weight in 2 month(s).  No appts available sooner.

## 2014-05-07 NOTE — Patient Instructions (Signed)
-   You need to have a discussion with family members about how much you can continue to help.    - Who are the top three "users" of your time and energy? - Write a list of 7 meals that are healthy, taste good, and relatively easy to prepare.    - Healthy = Includes some protein, starch, and vegetables.  (Preferrable, the veg's will half the volume of your whole meal.) - Email your list to [email or web address]. If you don't hear from me within 3 days, call [phone].    Goals: 1. Eat at least 3 meals and 1-2 snacks per day.  Aim for no more than 5 hours between eating.  Eat breakfast within one hour of getting up.  2. Vegetables at both lunch and dinner.  3. Keep a daily food record.   4. Chair exercises:  Write # of minutes you exercise on a calendar.    - What will help you to follow through with this:  Design a routine of exercise (write it down; email this list also).   - Use small plates, glasses, etc.   - Keep junk foods out of the house (or at least keep well hidden).   - Go to [email or web address], and watch the video from June:  Have a few chips, not the whole bag.  Email Jeannie what you got from that video.  What can you USE?

## 2014-05-08 ENCOUNTER — Telehealth: Payer: Self-pay | Admitting: Family Medicine

## 2014-05-08 DIAGNOSIS — E785 Hyperlipidemia, unspecified: Secondary | ICD-10-CM

## 2014-05-08 MED ORDER — ATORVASTATIN CALCIUM 40 MG PO TABS: 40.0000 mg | ORAL_TABLET | Freq: Every day | ORAL | Status: DC

## 2014-05-08 NOTE — Telephone Encounter (Signed)
Called patient to discuss lab results. Discussed that A1c has creeped up a little to 7.6 from 7.4, and lipids give her a 10 year ASCVD risk of 10.7%, for which I recommend high-intensity statin. Discussed SEs to look out for and that we would check transaminases in 6 months. She voiced understanding and is amenable to starting. Also emphasized importance of continued diet/exercise. She is interested in bariatric surgery and will ask her insurance if it is covered and get back to me about referral. - Atorvastatin 40mg  daily rx'ed - Continue diet/exercise. - Call us once know about insurance coverage of bariatric surgery, for referral.  Hilton Sinclair, MD PGY-3, [place]

## 2014-05-29 ENCOUNTER — Other Ambulatory Visit: Payer: Self-pay | Admitting: Family Medicine

## 2014-05-29 DIAGNOSIS — Z1231 Encounter for screening mammogram for malignant neoplasm of breast: Secondary | ICD-10-CM

## 2014-05-31 ENCOUNTER — Other Ambulatory Visit: Payer: Self-pay | Admitting: Family Medicine

## 2014-06-17 ENCOUNTER — Other Ambulatory Visit: Payer: Self-pay | Admitting: Family Medicine

## 2014-06-19 NOTE — Telephone Encounter (Signed)
Pt called to check the status of her refill request on Glipizide. jw

## 2014-06-19 NOTE — Telephone Encounter (Signed)
Please let her know this has been sent.  Hilton Sinclair, MD

## 2014-07-01 ENCOUNTER — Ambulatory Visit: Payer: No Typology Code available for payment source | Admitting: Family Medicine

## 2014-07-08 ENCOUNTER — Ambulatory Visit (HOSPITAL_COMMUNITY): Payer: No Typology Code available for payment source

## 2014-07-18 ENCOUNTER — Ambulatory Visit (HOSPITAL_COMMUNITY): Payer: No Typology Code available for payment source

## 2014-07-26 ENCOUNTER — Ambulatory Visit (HOSPITAL_COMMUNITY)
Admission: RE | Admit: 2014-07-26 | Discharge: 2014-07-26 | Disposition: A | Discharge status: Home / Self Care | Payer: 59 | Source: Ambulatory Visit | Attending: Family Medicine | Admitting: Family Medicine

## 2014-07-26 DIAGNOSIS — Z1231 Encounter for screening mammogram for malignant neoplasm of breast: Secondary | ICD-10-CM | POA: Diagnosis not present

## 2014-07-31 ENCOUNTER — Ambulatory Visit (INDEPENDENT_AMBULATORY_CARE_PROVIDER_SITE_OTHER): Payer: 59 | Admitting: Family Medicine

## 2014-07-31 ENCOUNTER — Encounter: Payer: Self-pay | Admitting: Family Medicine

## 2014-07-31 VITALS — BP 143/90 | HR 91 | Temp 98.2°F | Ht 65.0 in | Wt 357.0 lb

## 2014-07-31 DIAGNOSIS — E785 Hyperlipidemia, unspecified: Secondary | ICD-10-CM

## 2014-07-31 DIAGNOSIS — M170 Bilateral primary osteoarthritis of knee: Secondary | ICD-10-CM

## 2014-07-31 DIAGNOSIS — E119 Type 2 diabetes mellitus without complications: Secondary | ICD-10-CM

## 2014-07-31 DIAGNOSIS — E669 Obesity, unspecified: Secondary | ICD-10-CM

## 2014-07-31 LAB — POCT GLYCOSYLATED HEMOGLOBIN (HGB A1C): Hemoglobin A1C: 7.7

## 2014-07-31 MED ORDER — TRAMADOL HCL 50 MG PO TABS: 50.0000 mg | ORAL_TABLET | Freq: Two times a day (BID) | ORAL | Status: DC | PRN

## 2014-07-31 MED ORDER — GABAPENTIN 300 MG PO CAPS: ORAL_CAPSULE | ORAL | Status: DC

## 2014-07-31 NOTE — Assessment & Plan Note (Addendum)
Patient generally frustrated about weight loss due to pain in knees. On review, she has, however, lost 9 lbs since Nov, downt o 357 lbs. Pain is major limiting factor. - Referred to general surgery to consider bariatric surg. Discussed bringing daughter to help answer questions.  - Discussed adherence to strict nonsurgical diet/exercise methods to lose weight. Pt very motivated so has good chances where this is concerned. - Has next appt with Dr Jenne Campus 2/15. Discussed trying to come up with reliable exercise option (since walking hurts and swimming does not work with her schedule). Possibly walk shorter distance? Buy recumbent bike? - Did not f/u on if pt taking orlistat I had rx'ed in May 2015.

## 2014-07-31 NOTE — Assessment & Plan Note (Signed)
>>  ASSESSMENT AND PLAN FOR ARTHRITIS OF KNEE, DEGENERATIVE WRITTEN ON 07/31/2014 12:01 PM BY THEKKEKANDAM, MARIA T, MD  OA with 2012 imaging showing mild degeneration left knee and mod-severe right knee. She by now likely has worsened degeneration. No redness/swelling/fever. Minimally performing PT. - Will trial transition from vicodin to tramadol  due to theoretical less abuse potential, giving another try at 50-100mg  daily, #60 with 1 refill. - Continue home PT and encouraged doing daily; discussed getting recumbent bike.  - Referral to ortho placed. Open to synovial injection; unclear if she is open to surgery but is interested due to level of pain; 08/16/14 with Dr Gwenn appt [facility] with [facility] - Gabapentin  300mg  daily around time of cycle due to LE generalized pain likely related to OA in knees with pain worse around period. Warned re: drowsiness. If no improvement at f/u, could increase dose or trial TCA. - continue knee compression - f/u 1 mo

## 2014-07-31 NOTE — Progress Notes (Signed)
Patient ID: Valerie Carter, female   DOB: [birth year], 54 y.o.   MRN: [id number] Subjective:   CC: Follow up knee pain, obesity  HPI:   Follow up knee pain Knee injections 11/9; relief only lasted 1 week No swelling or redness Scared of surgery because fears she is too heavy and fears complications/clots. Not doing PT at home daily because cannot find the time Takes vicodin nightly, doesn't last through the month because we were trying 15 tab/mo. Tried tramadol for 1 month which did not help at all a few years ago. Pain comes and goes, 10/10, worse around menstrual cycle. Has tried aleve, advil which do not help; pain around cycle is "entire lower body" at knees, knee, back, hips, bottom of feet, top of feet   Weight loss She is interested in weight loss surgery and thinks her insurance would cover it. Her daughter is nervous about the surg thinking pt is too old for it. Exercise: walking but not much because of pain. [facility] but hours dont work    Review of Systems - Per HPI.   PMH - Polyarthritis, OSA, obesity, menorrhagia, HTN, HLD, fatigue, LE edema, dyshidrotic eczema, DM, axillary abscess, iron def anemia    Objective:  Physical Exam BP 143/90 mmHg  Pulse 91  Temp(Src) 98.2 F (36.8 C) (Oral)  Ht 5\' 5"  (1.651 m)  Wt 357 lb (161.934 kg)  BMI 59.41 kg/m2  LMP 07/12/2014 (Approximate) GEN: NAD Morbid obesity EXTR: righ tleg chronic tenderness and bilateral 2+ LE edema; no asymmetry on gross inspection or erythema CV: RRR, dsitant PULM: CTAB, normal effort    DG left knee 07/25/10:  1. Stable degenerative change of the left knee. 2. No acute abnormality.  DG right knee: 07/25/10: 1. Moderate to severe degenerative changes along the right knee, particularly in the lateral and patellofemoral compartments. 2. No acute abnormality. Assessment:     Valerie Carter is a 54 y.o. female here for f/u knee pain and obesity.    Plan:     # See  problem list and after visit summary for problem-specific plans. Leg swelling:  - Discused keeping legs propped 2x/day. - Compression hose  Follow-up: Follow up in 1 mont for f/u knee pain.     Hilton Sinclair, MD [place]

## 2014-07-31 NOTE — Patient Instructions (Addendum)
Good to see you.  I have placed a referral for orthopedics. I have also printed a prescription for tramadol.  Try this 1-2 times daily. Follow up with me in 1 month to see how you are doing. Keep up physical therapy daily. Consider a recumbent bike. This would be great exercise. I am also prescribing gabapentin, a medication to take for nerve pain during your cycle. This can make you drowsy.  I also placed the referral for general surgery. They can just talk to you about bariatric surgery. Keep working on figuring out what exercise you can do daily. This is very important.  Best,  Hilton Sinclair, MD

## 2014-07-31 NOTE — Assessment & Plan Note (Addendum)
OA with 2012 imaging showing mild degeneration left knee and mod-severe right knee. She by now likely has worsened degeneration. No redness/swelling/fever. Minimally performing PT. - Will trial transition from vicodin to tramadol due to theoretical less abuse potential, giving another try at 50-100mg  daily, #60 with 1 refill. - Continue home PT and encouraged doing daily; discussed getting recumbent bike.  - Referral to ortho placed. Open to synovial injection; unclear if she is open to surgery but is interested due to level of pain; 08/16/14 with Dr Drema Dallas appt made with [facility] - Gabapentin 300mg  daily around time of cycle due to LE generalized pain likely related to OA in knees with pain worse around period. Warned re: drowsiness. If no improvement at f/u, could increase dose or trial TCA. - continue knee compression - f/u 1 mo

## 2014-08-02 ENCOUNTER — Telehealth: Payer: Self-pay | Admitting: Family Medicine

## 2014-08-02 NOTE — Telephone Encounter (Signed)
Pt is returning [place] call. jw

## 2014-08-02 NOTE — Telephone Encounter (Signed)
Spoke with patient and she did get her pcp changed to Dr. Adrian Blackwater who is no longer in our clinic.  Informed her to ask for Dr. Nori Riis, [place], [place]...etc.  Any of them will work.  Also explained to her that she would still continue to see Dr. Darene Lamer even though her name won't be on the card.  She is aware of this and will let me know once this is done so I can check [facility] online. Elwood Bazinet,CMA

## 2014-08-05 ENCOUNTER — Telehealth: Payer: Self-pay | Admitting: Family Medicine

## 2014-08-05 NOTE — Telephone Encounter (Signed)
Has appt with [facility] tomorrow.   Needs a referral. Bilateral knee pain diagnois [phone] Please fax to [phone]

## 2014-08-06 NOTE — Telephone Encounter (Signed)
Referral information faxed to [facility]. Sherlon Nied,CMA

## 2014-08-13 ENCOUNTER — Ambulatory Visit: Payer: 59 | Admitting: Family Medicine

## 2014-09-02 ENCOUNTER — Encounter: Payer: Self-pay | Admitting: Internal Medicine

## 2014-09-02 NOTE — Progress Notes (Signed)
This encounter was created in error - please disregard.

## 2014-09-16 ENCOUNTER — Other Ambulatory Visit: Payer: Self-pay | Admitting: Family Medicine

## 2014-09-17 NOTE — Telephone Encounter (Signed)
Pt has an appt on 09-25-14. Jazmin Hartsell,CMA

## 2014-09-17 NOTE — Telephone Encounter (Signed)
Please call to ask patient if she has been taking norvasc (last refill was in 2013). Also let her know that we need a visit just to discuss her diabetes and hypertension as we have not reassessed those in some time. If she has not been taking norvasc, she should hold off until we meet. Hilton Sinclair, MD

## 2014-09-23 ENCOUNTER — Ambulatory Visit: Payer: 59 | Admitting: Family Medicine

## 2014-09-25 ENCOUNTER — Ambulatory Visit (INDEPENDENT_AMBULATORY_CARE_PROVIDER_SITE_OTHER): Payer: 59 | Admitting: Family Medicine

## 2014-09-25 ENCOUNTER — Encounter: Payer: Self-pay | Admitting: Family Medicine

## 2014-09-25 VITALS — BP 145/94 | HR 94 | Temp 98.5°F | Ht 65.0 in | Wt 363.2 lb

## 2014-09-25 DIAGNOSIS — M5441 Lumbago with sciatica, right side: Secondary | ICD-10-CM

## 2014-09-25 DIAGNOSIS — M170 Bilateral primary osteoarthritis of knee: Secondary | ICD-10-CM

## 2014-09-25 DIAGNOSIS — M5442 Lumbago with sciatica, left side: Secondary | ICD-10-CM

## 2014-09-25 DIAGNOSIS — E669 Obesity, unspecified: Secondary | ICD-10-CM

## 2014-09-25 DIAGNOSIS — M545 Low back pain: Secondary | ICD-10-CM | POA: Diagnosis not present

## 2014-09-25 DIAGNOSIS — F32A Depression, unspecified: Secondary | ICD-10-CM | POA: Insufficient documentation

## 2014-09-25 DIAGNOSIS — F329 Major depressive disorder, single episode, unspecified: Secondary | ICD-10-CM

## 2014-09-25 DIAGNOSIS — F320 Major depressive disorder, single episode, mild: Secondary | ICD-10-CM | POA: Insufficient documentation

## 2014-09-25 MED ORDER — HYDROCODONE-ACETAMINOPHEN 10-325 MG PO TABS: 1.0000 | ORAL_TABLET | Freq: Every evening | ORAL | Status: DC | PRN

## 2014-09-25 MED ORDER — VENLAFAXINE HCL ER 75 MG PO CP24: 75.0000 mg | ORAL_CAPSULE | Freq: Every day | ORAL | Status: DC

## 2014-09-25 NOTE — Progress Notes (Signed)
Patient ID: Valerie Carter, female   DOB: Oct 06, 1960, 54 y.o.   MRN: [id number]  Subjective:   CC: Follow up knee pain, obesity  HPI:   Low back pain Present for more than 2 weeks, out of the blue. This has occurred in the past, but never this severe. Denies any increased lifting, trauma, or other trigger. Tramadol and gabapentin have not helped and gabapentin makes her feel off, like "nerves are fighting each other". Pain is mid lumbar down bilateral posterior legs in both feet with some tingling. Denies any weakness, bowel or bladder incontinence, numbness or tingling in genitalia. Has never had back pain evaluated. Has tried some ibuprofen which has helped a little. Denies any history of GERD. Pain is worse with standing. Leaning on cart does not help. Back is stiff in the morning. No worse with laying down. Slightly worse with leaning or turning to the right. Pain is 10 out of 10. She feels low back pain and knee pain are making it hard for her to function and she would like to continue taking care of her grandson.  Follow up knee pain Knee injections 11/9; relief only lasted 1 week, does not want these again. No swelling or redness Pain is no better or no worse than previously. Met with orthopedic surgery is that she has severe bone-on-bone arthritis in her knees and their only suggestion was weight loss surgery. They felt the viscous supplementation knee injections would not help due to her weight. At last visit, had not been doing the PT regularly. Tramadol and gabapentin did not help at all. Would like to discontinue them. The only thing that has helped in the past has been Vicodin. Pain is 10 out of 10.   Weight loss She has not received any call about weight loss surgery consultation appointment. Her daughter is nervous about the surg thinking pt is too old for it. Patient is also nervous that she might die on the operating table. She is amenable to meeting with surgery for  consultation. Missed Feb appt with Dr Jenne Campus. Exercise: walking but not much because of pain. [facility] but hours dont work  Bump bottom of left foot Left foot a few weeks ago had a bump on the bottom that she took a picture of. It was not painful but her daughter noticed that. She shows me the picture today. She is not sure if it is still present.  Review of Systems - Per HPI.   PMH - Polyarthritis, OSA, obesity, menorrhagia, HTN, HLD, fatigue, LE edema, dyshidrotic eczema, DM, axillary abscess, iron def anemia    Objective:  Physical Exam BP 145/94 mmHg  Pulse 94  Temp(Src) 98.5 F (36.9 C) (Oral)  Ht '5\' 5"'  (1.651 m)  Wt 363 lb 3 oz (164.741 kg)  BMI 60.44 kg/m2 GEN: NAD, Morbid obesity EXTR: Bilateral generalized knee tenderness, no effusion or erythema Back: Tenderness to palpation lumbar spine and paraspinous muscles and buttocks, mildly limited range of motion by pain in all directions, worse with rightward side bending, negative straight leg raise bilaterally, mildly antalgic stiff slow gait, 5 out of 5 bilateral hip flexion, 5 out of 5 bilateral hip abduction and abduction, full hip rotation with no reproduced pain PULM: normal effort SKIN: No rash or swelling left plantar foot, no tenderness    DG left knee 07/25/10:  1. Stable degenerative change of the left knee. 2. No acute abnormality.  DG right knee: 07/25/10: 1. Moderate to  severe degenerative changes along the right knee, particularly in the lateral and patellofemoral compartments. 2. No acute abnormality. Assessment:     Valerie Carter is a 54 y.o. female here for f/u knee pain and obesity.    Plan:     # See problem list and after visit summary for problem-specific plans. - Nodule on left foot:  - Bump on bottom of left foot has resolved. Possibly was a neuroma or other inflammation? No tenderness at the site.  -Follow-up with me if this returns.  # Health Maintenance-patient is  overdue and states her last Pap smear was 9 years ago. -Make an appointment with me for a well visit.  Follow-up: Follow up in 2-3 weeks for f/u knee pain.    Hilton Sinclair, MD [place]

## 2014-09-25 NOTE — Assessment & Plan Note (Signed)
PHQ-9 score 9, qualified that due to pain, life is "not normal." - starting effexor today. - F/u 2 weeks. - At that time, consider referral to therapist to discuss ways to manage pain and depressive symptoms.

## 2014-09-25 NOTE — Assessment & Plan Note (Signed)
Difficult to discern if this is muscle spasm lumbar radiculopathy, or spinal stenosis with generalized pain with movement, radiation down legs but Negative straight leg raise, and worse with standing.  no lumbar imaging in recent past. Present 2 weeks. We'll hold off on imaging for now. No red flag symptoms. -Tramadol and gabapentin discontinued as it does not help patient. -Discussed long-term narcotics not a good option. -Rx'ed #15 vicodin for acute back pain -Trial PT (exercises discussed - pt prefers home PT) -Start venlafaxine xr 37.5mg  x 4 days, then 75mg  daily -F/u 2 weeks; At f/u consider flexeril and lumbar x-ray if no improvement  -Heat, massage, stay active -Rechecking on surgery referral for weight loss for patient.

## 2014-09-25 NOTE — Patient Instructions (Signed)
You may be having a back muscle spasm or some spinal stenosis. Let us hold off on xray for now and see how you feel when you return. Stop tramadol and gabapentin since they are not helping. Long-term narcotics are not a good option but I am prescribing a short course - only take for severe pain during this flare up Also, start venlafaxine 1/2 tab for 4 days, then 1 tab daily. This helps with nerve-related pain. Do physical therapy exercises every day. Follow up in 2 weeks. Continue PT for knee strengthening. Use heat and massage as well. Stay moving as much as possible. We are checking on the surgery referral so you can have a consutlation. Bring your daughter.

## 2014-09-25 NOTE — Assessment & Plan Note (Signed)
OA with 2012 imaging showing mild degeneration left knee and mod-severe right knee. Reports that imaging at orthopedics visit showed bone-on-bone arthritis. No redness/swelling/fever. Minimally performing PT. - Discontinue tramadol as it did not help. Discontinue gabapentin. Starting venlafaxine (see below). - Continue home PT and encouraged doing daily; consider recumbent bike. Stay active..  - Orthopedics does not feel synovial injection would be helpful due to weight.  - CMA is following up on surgery referral for weight loss surgery. There appears to be an insurance card issue. - continue knee compression - f/u 1 mo

## 2014-09-25 NOTE — Assessment & Plan Note (Signed)
>>  ASSESSMENT AND PLAN FOR ARTHRITIS OF KNEE, DEGENERATIVE WRITTEN ON 09/25/2014 12:20 PM BY THEKKEKANDAM, MARIA T, MD  OA with 2012 imaging showing mild degeneration left knee and mod-severe right knee. Reports that imaging at orthopedics visit showed bone-on-bone arthritis. No redness/swelling/fever. Minimally performing PT. - Discontinue tramadol  as it did not help. Discontinue gabapentin . Starting venlafaxine  (see below). - Continue home PT and encouraged doing daily; consider recumbent bike. Stay active..  - Orthopedics does not feel synovial injection would be helpful due to weight.  - CMA is following up on surgery referral for weight loss surgery. There appears to be an insurance card issue. - continue knee compression - f/u 1 mo

## 2014-09-25 NOTE — Assessment & Plan Note (Signed)
Patient generally frustrated about weight loss due to pain in knees. She is very motivated but it is a major limiting factor. - Following up on general surgery referral for bariatric surgery consult. Discussed bringing her daughter as she is nervous about surgery. - We had in the past discussed adherence to strict nonsurgical diet/exercise methods to lose weight. Pt very motivated so has good chances where this is concerned. - Did not f/u on if pt taking orlistat I had rx'ed in May 2015 or missed appointment with Dr. Jenne Campus. We'll send a letter asking her to reschedule appointment with Dr Jenne Campus.

## 2014-10-12 ENCOUNTER — Other Ambulatory Visit: Payer: Self-pay | Admitting: Family Medicine

## 2014-12-11 ENCOUNTER — Encounter: Payer: Self-pay | Admitting: Family Medicine

## 2014-12-11 ENCOUNTER — Ambulatory Visit (INDEPENDENT_AMBULATORY_CARE_PROVIDER_SITE_OTHER): Payer: 59 | Admitting: Family Medicine

## 2014-12-11 VITALS — BP 152/70 | HR 100 | Temp 98.6°F | Ht 65.0 in | Wt 363.0 lb

## 2014-12-11 DIAGNOSIS — F329 Major depressive disorder, single episode, unspecified: Secondary | ICD-10-CM

## 2014-12-11 DIAGNOSIS — F320 Major depressive disorder, single episode, mild: Secondary | ICD-10-CM

## 2014-12-11 DIAGNOSIS — M170 Bilateral primary osteoarthritis of knee: Secondary | ICD-10-CM | POA: Diagnosis not present

## 2014-12-11 DIAGNOSIS — I10 Essential (primary) hypertension: Secondary | ICD-10-CM

## 2014-12-11 DIAGNOSIS — F32A Depression, unspecified: Secondary | ICD-10-CM

## 2014-12-11 MED ORDER — METHYLPREDNISOLONE ACETATE 40 MG/ML IJ SUSP
80.0000 mg | Freq: Once | INTRAMUSCULAR | Status: AC
  Administered 2014-12-11: 80 mg via INTRA_ARTICULAR

## 2014-12-11 NOTE — Patient Instructions (Signed)
We did knee injections today. Keep your eye out for infection signs and let us know immediately if this occurs. Stay active, walking or moving your legs (even just not constantly sitting) throughout the day. Continue the PT you learned at home daily. Consider seeing a therapist to talk about the chronic pain and how that affects your mood. [email or web address] or Dr Gwenlyn Saran. Let me know if you would like to retry an antidepressant.  We are looking into your insurance issue with the weight loss surgery.  Check BP at [facility] or your pharmacy three times this week and call with those numbers.  Best,  Hilton Sinclair, MD

## 2014-12-11 NOTE — Progress Notes (Signed)
Patient ID: Valerie Carter, female   DOB: [birth year], 54 y.o.   MRN: [id number] Subjective:   CC: F/u knee pain  HPI:   F/u knee pain Patient would like a knee injection today bilaterally. She states pain is 8-9/10. Denies swelling, fevers, chills, induration, or warmth. Last injection lasted 2-3 weeks. She has been walking more the past few days. Has previously sat in bus while waiting because worries about people teasing her for limping.  Blood pressure and weight BP elevated today. Patient does not check at home. She has been interested in weight loss surgery and has been waiting to hear about an appointment but has not heard anything.  Mild depression Discussed last visit, and started effexor 3/30. Pt took 3 weeks but stopped due to increased headache and gas. No SI/HI. Symptoms wax and wane.   Review of Systems - Per HPI.   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 Consent obtained and verified. Time-out conducted. Noted no overlying erythema, induration, or other signs of local infection. Skin prepped in a sterile fashion. Topical analgesic spray: Ethyl chloride. Completed without difficulty x 2. Meds: 4cc lidocaine + 40mg  depomedrol each knee Pain immediately improved suggesting accurate placement of the medication. Advised to call if fevers/chills, erythema, induration, drainage,  or persistent bleeding.    Assessment:     Valerie Carter is a 54 y.o. female here for knee pain and f/u depression and BP.    Plan:     # See problem list and after visit summary for problem-specific plans. - Due for DM follow up,  - Will fill out work forms for patient to come pick up tomorrow   Follow-up: Follow up in 1 month for f/u knee pain. Needs appt to f/u DM.    Hilton Sinclair, MD [place]

## 2014-12-12 ENCOUNTER — Telehealth: Payer: Self-pay | Admitting: Family Medicine

## 2014-12-12 NOTE — Telephone Encounter (Signed)
LM for patient to call back.  Please inform her that her forms are at the front desk ready for pick up. Thanks [employer]

## 2014-12-12 NOTE — Telephone Encounter (Signed)
Blue team, please notify [name] that I have filled out the paperwork from the [facility] and she can pick it up up front today (she needed it by today). Please have her check if I have missed any sections. Thank you.  Valerie Sinclair, MD

## 2014-12-18 NOTE — Assessment & Plan Note (Signed)
BP elevated today. Morbid obesity, weighing 365 lbs (same as March). Difficult to exercise because of severe bilateral chronic knee pain. -Clinic staff again looking into referal I had previously placed to general surg re: bariatric surgery. Will get in touch with patient. Asked her to contact clinic if she has not heard anything in ~1 week. -Pt to check BP at [facility] and call with values. -Continue norvasc, benazepril, HCTZ

## 2014-12-18 NOTE — Assessment & Plan Note (Signed)
>>  ASSESSMENT AND PLAN FOR ARTHRITIS OF KNEE, DEGENERATIVE WRITTEN ON 12/18/2014  7:17 PM BY THEKKEKANDAM, MARIA T, MD  Injection today bilaterally per procedure note.  -Monitor for signs of infection -Urged exercise and never sitting for >1 hour without stretching/standing  -Continue daily rehab exercises at home -Consider therapist to discuss chronic pain / coping techniques. Gave Dr Vinita card and website [email or web address]. Pt will notify if wants to try antidepressant again (failed effexor  due to SE). -Continue to think about weight loss surgery - looking into insurance issue.

## 2014-12-18 NOTE — Assessment & Plan Note (Signed)
Mild depression, failed effexor (SE's). Well-appearing, no SI/HI. -Not interested in medication at this time, but willing to consider therapy. Provided Dr Tod Persia card and [email or web address]. -Stay active.

## 2014-12-18 NOTE — Assessment & Plan Note (Signed)
Injection today bilaterally per procedure note.  -Monitor for signs of infection -Urged exercise and never sitting for >1 hour without stretching/standing  -Continue daily rehab exercises at home -Consider therapist to discuss chronic pain / coping techniques. Gave Dr Tod Persia card and website [email or web address]. Pt will notify if wants to try antidepressant again (failed effexor due to SE). -Continue to think about weight loss surgery - looking into insurance issue.

## 2014-12-23 ENCOUNTER — Other Ambulatory Visit: Payer: Self-pay

## 2015-01-09 ENCOUNTER — Other Ambulatory Visit: Payer: Self-pay | Admitting: *Deleted

## 2015-01-09 MED ORDER — GLIPIZIDE 5 MG PO TABS: ORAL_TABLET | ORAL | Status: DC

## 2015-02-06 ENCOUNTER — Ambulatory Visit (INDEPENDENT_AMBULATORY_CARE_PROVIDER_SITE_OTHER): Payer: 59 | Admitting: Obstetrics and Gynecology

## 2015-02-06 ENCOUNTER — Encounter: Payer: Self-pay | Admitting: Obstetrics and Gynecology

## 2015-02-06 ENCOUNTER — Other Ambulatory Visit (HOSPITAL_COMMUNITY)
Admission: RE | Admit: 2015-02-06 | Discharge: 2015-02-06 | Disposition: A | Discharge status: Home / Self Care | Payer: 59 | Source: Ambulatory Visit | Attending: Family Medicine | Admitting: Family Medicine

## 2015-02-06 VITALS — BP 133/77 | HR 90 | Temp 98.8°F | Resp 16 | Wt 359.4 lb

## 2015-02-06 DIAGNOSIS — Z23 Encounter for immunization: Secondary | ICD-10-CM

## 2015-02-06 DIAGNOSIS — N898 Other specified noninflammatory disorders of vagina: Secondary | ICD-10-CM

## 2015-02-06 DIAGNOSIS — Z01419 Encounter for gynecological examination (general) (routine) without abnormal findings: Secondary | ICD-10-CM | POA: Diagnosis not present

## 2015-02-06 DIAGNOSIS — Z1151 Encounter for screening for human papillomavirus (HPV): Secondary | ICD-10-CM | POA: Diagnosis present

## 2015-02-06 DIAGNOSIS — Z0000 Encounter for general adult medical examination without abnormal findings: Secondary | ICD-10-CM

## 2015-02-06 DIAGNOSIS — I10 Essential (primary) hypertension: Secondary | ICD-10-CM | POA: Diagnosis not present

## 2015-02-06 DIAGNOSIS — D509 Iron deficiency anemia, unspecified: Secondary | ICD-10-CM

## 2015-02-06 DIAGNOSIS — E119 Type 2 diabetes mellitus without complications: Secondary | ICD-10-CM | POA: Diagnosis not present

## 2015-02-06 LAB — POCT WET PREP (WET MOUNT): Clue Cells Wet Prep Whiff POC: NEGATIVE

## 2015-02-06 LAB — CBC
HCT: 33 % — ABNORMAL LOW (ref 36.0–46.0)
HEMOGLOBIN: 10.2 g/dL — AB (ref 12.0–15.0)
MCH: 21.5 pg — ABNORMAL LOW (ref 26.0–34.0)
MCHC: 30.9 g/dL (ref 30.0–36.0)
MCV: 69.5 fL — ABNORMAL LOW (ref 78.0–100.0)
MPV: 9.3 fL (ref 8.6–12.4)
Platelets: 357 10*3/uL (ref 150–400)
RBC: 4.75 MIL/uL (ref 3.87–5.11)
RDW: 17.5 % — AB (ref 11.5–15.5)
WBC: 9 10*3/uL (ref 4.0–10.5)

## 2015-02-06 LAB — POCT GLYCOSYLATED HEMOGLOBIN (HGB A1C): HEMOGLOBIN A1C: 8.3

## 2015-02-06 NOTE — Assessment & Plan Note (Signed)
Preventative visit today. Patient received Tdap today. Already had pneumococcal vaccine. Mammogram up to date. She received pap smear today. Handout given on scheduling colonoscopy, health maintenance, and advance directive. Please see health maintenance tab in EMR for more details.

## 2015-02-06 NOTE — Assessment & Plan Note (Signed)
Unable to discuss today as this was a preventative visit. A1c and urine microalbumin ordered. Patient to RTC in ~2 months to discuss chronic medical problems.

## 2015-02-06 NOTE — Assessment & Plan Note (Signed)
Controlled today. Goal <140/80. Continue medications. Routine blood work collected. Patient to RTC in 2 months to discuss chronic medical problems.

## 2015-02-06 NOTE — Assessment & Plan Note (Signed)
Not reassessed since 2012. Will collect CBC today. Patient no longer taking iron.

## 2015-02-06 NOTE — Assessment & Plan Note (Signed)
Patient declined STD testing. On exam concern for yeast infection. Wet prep obtained. Will inform patient of any abnormal results and treat as appropriate.

## 2015-02-06 NOTE — Patient Instructions (Signed)

## 2015-02-06 NOTE — Progress Notes (Signed)
Patient ID: Valerie Carter, female   DOB: April 15, 1961, 54 y.o.   MRN: [id number]  Patient is a 54 y.o. year old female presents for well woman/preventative visit and annual GYN examination.  Acute Concerns:  #Knee and feet pain: Chronic issue, needs bilateral knee replacement; follows with ortho but they will not do a knee replacement until patient loses weight.   #Vaginal discharge: Concern for possible yeast infection. Denies any odor, itching, fevers, abnormal color.   ROS - positive for MSK pain and joint pain as noted above. Otherwise 12 point review of systems was performed and was unremarkable.  Diet: Well-balanced. Keeps fruits and vegetables available, not a lot of fast food, likes salt, minimal fried foods Dinner last night consist of baked beans, broiled chicken, cumumber salad Has followed with Dr. Kathaleen Grinder before   Exercise: Due to pian not doing any. Has gym membership but does not use. Has tried PT before.   Occupation: Drives school bus   Sexual/Birth History: No sexually active at this time. She is a G5P0. All pregnancies ended in miscarriage.  LMP: July 2016. Has not gone through menopause   Birth Control: None  POA/Living Will: Does not have one. States that her family knows her wishes.   Social:  Social History   Social History  . Marital Status: Legally Separated    Spouse Name: N/A  . Number of Children: N/A  . Years of Education: N/A   Social History Main Topics  . Smoking status: Never Smoker   . Smokeless tobacco: Never Used  . Alcohol Use: Yes     Comment: rare  . Drug Use: No  . Sexual Activity: Not on file   Other Topics Concern  . Not on file   Social History Narrative    Immunization:  Tdap/TD: Due  Influenza: Declined; will discuss again in fall  Pneumococcal: Up-to-date   Cancer Screening:  Pap Smear: Due  Mammogram: Up-to-date  Colonoscopy: Due   OBJECTIVE:  BP 133/77 mmHg  Pulse 90  Temp(Src) 98.8 F (37.1 C)  Resp 16   Wt 359 lb 6.4 oz (163.023 kg)  Physical Exam  Constitutional: She is oriented to person, place, and time and well-developed, well-nourished, and in no distress.  Morbidly obese  HENT:  Head: Normocephalic and atraumatic.  Mouth/Throat: Oropharynx is clear and moist.  Eyes: EOM are normal. Pupils are equal, round, and reactive to light.  Neck: Normal range of motion. Neck supple. No thyromegaly present.  Cardiovascular: Normal rate, regular rhythm, normal heart sounds and intact distal pulses.   Pulmonary/Chest: Effort normal and breath sounds normal. She has no wheezes.  Abdominal: Soft. Bowel sounds are normal. There is no tenderness.  Musculoskeletal: She exhibits no edema.  Lymphadenopathy:    She has no cervical adenopathy.  Neurological: She is alert and oriented to person, place, and time.  No focal deficits appreciated  Skin: Skin is warm and dry.  Dy skin with peeling noted on bilateral feet  Psychiatric: Mood and affect normal.  Genitalia:  Normal introitus for age, no external lesions, thick white vaginal discharge, mucosa pink and moist, no vaginal or cervical lesions, no vaginal atrophy, no friaility or hemorrhage.   Diabetic Foot Exam - Simple   Simple Foot Form  Diabetic Foot exam was performed with the following findings:  Yes 02/06/2015  9:45 AM  Visual Inspection  Sensation Testing  Pulse Check  Comments  Sensation intact      ASSESSMENT & PLAN: 54 y.o. female  presents for annual well woman/preventative exam and GYN exam. Please see problem specific assessment and plan.     Orders Placed This Encounter  Procedures  . CBC  . Comprehensive metabolic panel  . Microalbumin/Creatinine Ratio, Urine  . POCT Wet Prep [employer])  . POCT glycosylated hemoglobin (Hb A1C)    Luiz Blare, DO 02/06/2015, 9:44 AM PGY-2, [place]

## 2015-02-07 LAB — COMPREHENSIVE METABOLIC PANEL
ALT: 10 U/L (ref 6–29)
AST: 12 U/L (ref 10–35)
Albumin: 3.4 g/dL — ABNORMAL LOW (ref 3.6–5.1)
Alkaline Phosphatase: 73 U/L (ref 33–130)
BUN: 16 mg/dL (ref 7–25)
CALCIUM: 9.3 mg/dL (ref 8.6–10.4)
CHLORIDE: 104 mmol/L (ref 98–110)
CO2: 22 mmol/L (ref 20–31)
Creat: 0.77 mg/dL (ref 0.50–1.05)
Glucose, Bld: 178 mg/dL — ABNORMAL HIGH (ref 65–99)
Potassium: 4.4 mmol/L (ref 3.5–5.3)
Sodium: 140 mmol/L (ref 135–146)
Total Bilirubin: 0.5 mg/dL (ref 0.2–1.2)
Total Protein: 8 g/dL (ref 6.1–8.1)

## 2015-02-07 LAB — MICROALBUMIN / CREATININE URINE RATIO
Creatinine, Urine: 80.4 mg/dL
MICROALB UR: 0.8 mg/dL (ref <2.0)
Microalb Creat Ratio: 10 mg/g (ref 0.0–30.0)

## 2015-02-11 ENCOUNTER — Encounter: Payer: Self-pay | Admitting: Obstetrics and Gynecology

## 2015-02-11 LAB — CYTOLOGY - PAP

## 2015-02-27 ENCOUNTER — Encounter: Payer: Self-pay | Admitting: Obstetrics and Gynecology

## 2015-04-16 ENCOUNTER — Encounter: Payer: Self-pay | Admitting: Obstetrics and Gynecology

## 2015-04-16 ENCOUNTER — Ambulatory Visit (INDEPENDENT_AMBULATORY_CARE_PROVIDER_SITE_OTHER): Payer: 59 | Admitting: Obstetrics and Gynecology

## 2015-04-16 VITALS — BP 149/78 | HR 97 | Temp 97.9°F | Ht 65.0 in | Wt 364.0 lb

## 2015-04-16 DIAGNOSIS — R229 Localized swelling, mass and lump, unspecified: Secondary | ICD-10-CM

## 2015-04-16 DIAGNOSIS — G4733 Obstructive sleep apnea (adult) (pediatric): Secondary | ICD-10-CM

## 2015-04-16 DIAGNOSIS — E669 Obesity, unspecified: Secondary | ICD-10-CM

## 2015-04-16 NOTE — Progress Notes (Signed)
    Subjective: Chief Complaint  Patient presents with  . sleep study    HPI: Valerie Carter is a 54 y.o. 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 Leg: New - about 2 weeks Located on anterior shin Denies any trauma No h/o blood clots No associated bruising or erythema Pain to touch R leg used as driving leg  ROS in HPI.  Past Medical, Surgical, Social, and Family History Reviewed & Updated per EMR.   Objective: BP 149/78 mmHg  Pulse 97  Temp(Src) 97.9 F (36.6 C) (Oral)  Ht 5\' 5"  (1.651 m)  Wt 364 lb (165.109 kg)  BMI 60.57 kg/m2  LMP 01/14/2015 (Approximate)  Physical Exam  Constitutional: She is oriented to person, place, and time and well-developed, well-nourished, and in no distress.  Obese  HENT:  Mouth/Throat: Oropharynx is clear and moist.  Neck: Normal range of motion. Neck supple. No thyromegaly present.  Cardiovascular: Normal rate and intact distal pulses.   Pulmonary/Chest: Effort normal.  Musculoskeletal:  Right shin with 3 nodules about .5cm in size.No associated bruising, warmth or erythema. Good ROM on ankle and knee joints. Mild tenderness with palpation. Homan's sign negative.   Lymphadenopathy:    She has no cervical adenopathy.  Neurological: She is alert and oriented to person, place, and time.  Skin: Skin is dry.  Psychiatric: Mood and affect normal.    Assessment/Plan: Please see problem based Assessment and Plan   Orders Placed This Encounter  Procedures  . Split night study    Yearly sleep study for her job ([employer])    Standing Status: Future     Number of Occurrences:      Standing Expiration Date: 04/15/2016    Order Specific Question:  Where should  this test be performed:    Answer:  [facility] - Pulmonary    Luiz Blare, DO 04/16/2015  PGY-2, [place]

## 2015-04-16 NOTE — Patient Instructions (Signed)
Ms. Navejas it was great to see you today.  I am pleased to hear that things are going well for you.  Here are some of the things we discussed today: - Sleep study ordered you will get a call - Monitor those areas on your body if not better in 4-6 weeks we will evaluate further -Try and lose weight and work with Dr. Kathaleen Grinder; we can discuss weight loss surgery in the future  Please schedule a follow-up appointment as needed.    Thanks for allowing me to be a part of your care! Dr. Gerarda Fraction

## 2015-04-17 DIAGNOSIS — R229 Localized swelling, mass and lump, unspecified: Secondary | ICD-10-CM | POA: Insufficient documentation

## 2015-04-17 NOTE — Assessment & Plan Note (Signed)
Discussed need for weight loss with patient. She is committed to losing weight and says she has been dieting without much weight loss. 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 Patient interested in bariatric surgery but discussed need to try and lose weight with lifestyle modifications first. She is to schedule appointment with Dr. Jenne Campus (nutritionist). Discussed healthy eating and exercise. Will follow-up on motivation and changes at next visit.

## 2015-04-17 NOTE — Assessment & Plan Note (Signed)
Yearly sleep study ordered for employment purposes. Denies any symptoms of sleep apnea. Most likely secondary to weight. Does not use CPAP.

## 2015-04-17 NOTE — Assessment & Plan Note (Signed)
Nodule located on right anterior shin. Measuring about .5cm. No concerning signs or symptoms on exam. Most likely due to trauma to shin. No signs of infection or blood clot. Does not appear to be cyst. Conservative treatment at this time. Will continue to monitor. Patient to return if not better.

## 2015-04-21 ENCOUNTER — Other Ambulatory Visit: Payer: Self-pay | Admitting: Family Medicine

## 2015-04-23 ENCOUNTER — Other Ambulatory Visit: Payer: Self-pay | Admitting: Family Medicine

## 2015-04-23 ENCOUNTER — Telehealth: Payer: Self-pay | Admitting: Obstetrics and Gynecology

## 2015-04-23 DIAGNOSIS — G4733 Obstructive sleep apnea (adult) (pediatric): Secondary | ICD-10-CM

## 2015-04-23 MED ORDER — HYDROCHLOROTHIAZIDE 25 MG PO TABS: 25.0000 mg | ORAL_TABLET | Freq: Every day | ORAL | Status: DC

## 2015-04-23 NOTE — Telephone Encounter (Signed)
Pt calling because she requested two days ago for her pharmacy to request a refill on hydrochlorothiazide (HYDRODIURIL) 25 MG tablet. However, the pharmacy sent the request to her previous provider. Pt is already out of medication and needs this completed at the earliest convenience and sent to [facility] on [address].   Additionally, the patient was seen on 04/16/2015 by provider and was instructed to report to [place] if the spots on her legs have not gotten any better, which they have not. Please advise at the earliest convenience.   Lastly, the patient states that she was supposed to be awaiting a phone call with regard to a sleep study, she would like to check on the status of this request.   Sending to RN Team for the refill, sending to [facility] team for other concerns. Thank you, Fonda Kinder, ASA

## 2015-04-23 NOTE — Telephone Encounter (Signed)
Spoke with [facility] pulmonology and they only do sleep studies after a patient has been seen by their providers.  Will change order so patient can just go to sleep center first.  Will forward to Md to inform of this change, refill and skin status. Sequoia Witz,CMA

## 2015-04-23 NOTE — Telephone Encounter (Signed)
Nodules on anterior shins were unconcerning when I evaluated patient at last visit. If they are not symptomatic I would suggest patient to continue watching. If she is concerned she can come back in for evaluation and ultrasound of extremity.   Medication refill sent to pharmacy.

## 2015-04-24 NOTE — Telephone Encounter (Signed)
LM for patient that script was sent in and I have also scheduled her appt with the [facility] sleep center July 11, 2015.  She was also informed on her VM the message from Dr. Gerarda Fraction regarding her legs. Valerie Carter,CMA

## 2015-04-25 ENCOUNTER — Encounter: Payer: Self-pay | Admitting: Obstetrics and Gynecology

## 2015-04-25 ENCOUNTER — Telehealth: Payer: Self-pay | Admitting: *Deleted

## 2015-04-25 ENCOUNTER — Ambulatory Visit (INDEPENDENT_AMBULATORY_CARE_PROVIDER_SITE_OTHER): Payer: 59 | Admitting: Obstetrics and Gynecology

## 2015-04-25 VITALS — BP 136/70 | HR 89 | Temp 98.1°F

## 2015-04-25 DIAGNOSIS — D509 Iron deficiency anemia, unspecified: Secondary | ICD-10-CM

## 2015-04-25 DIAGNOSIS — L52 Erythema nodosum: Secondary | ICD-10-CM | POA: Diagnosis not present

## 2015-04-25 DIAGNOSIS — R229 Localized swelling, mass and lump, unspecified: Secondary | ICD-10-CM | POA: Diagnosis not present

## 2015-04-25 MED ORDER — NAPROXEN 500 MG PO TABS: 500.0000 mg | ORAL_TABLET | Freq: Two times a day (BID) | ORAL | Status: DC

## 2015-04-25 MED ORDER — FERROUS SULFATE 325 (65 FE) MG PO TABS: 325.0000 mg | ORAL_TABLET | Freq: Every day | ORAL | Status: DC

## 2015-04-25 MED ORDER — INDOMETHACIN 20 MG PO CAPS: 20.0000 mg | ORAL_CAPSULE | Freq: Three times a day (TID) | ORAL | Status: DC | PRN

## 2015-04-25 NOTE — Telephone Encounter (Signed)
Prior Authorization received from [place] for Tivorbex 20 mg. PA form placed in provider box for completion. Derl Barrow, RN

## 2015-04-25 NOTE — Progress Notes (Signed)
   Subjective:   Patient ID: Valerie Carter, female    DOB: [birth year], 54 y.o.   MRN: [id number]  Patient presents for Same Day Appointment  Chief Complaint  Patient presents with  . Leg Swelling    HPI: #Leg Swelling: -continues to endorse leg pain on R. -rates it a 6/10 -Both ankles are swollen -area of pain is around the nodules on her leg -nodules are red and painful. Located on bilateral legs on shins -nodules not located anywhere else -present for last 2.5 weeks -denies any new medicine use -denies recent illness -denies trauma to area -denies fevers, joint pain, abdominal pain -was seen on 10/19 for nodules and plan was to observe; represents today because they have not gone away  Review of Systems   See HPI for ROS.   Past medical history, surgical, family, and social history reviewed and updated in the EMR as appropriate.  Objective:  BP 136/70 mmHg  Pulse 89  Temp(Src) 98.1 F (36.7 C) (Oral)  Wt   SpO2 99%  LMP 01/14/2015 (Approximate) Vitals and nursing note reviewed  Physical Exam  Constitutional: She is well-developed, well-nourished, and in no distress.  HENT:  Head: Normocephalic and atraumatic.  Cardiovascular: Normal rate, regular rhythm and intact distal pulses.   Pulmonary/Chest: Effort normal and breath sounds normal.  Musculoskeletal: Normal range of motion.  Skin: Skin is warm and dry.  Extremities: Bilateral lower legs with erythematous nodules on anterior shins. Tender to palpation. Trace non-pitting edema of ankles.   Assessment & Plan:  1. Erythema nodosum: Patient with painful red nodules on bilateral anterior tibia consistent with erythema nodosum. Lesions have been present for about 2.5wks now. No illness or trauma to area. She is well-appearing and afebrile. No precipitant to lesions such as infection or medications.  -patient cannot tolerate naproxen so will prescribe ibuprofen 600mg  for pain relief -conservative managemnt  -if  continues consider possible ESR, other labs for inflammatory conditions, or derm referral/biopsy -discussed that course of illness can last between 2-8 weeks  2. ANEMIA, IRON DEFICIENCY -Refilled ferrous sulfate 325 (65 FE) MG tablet; Take 1 tablet (325 mg total) by mouth daily with breakfast.  Dispense: 30 tablet; Refill: 11  Case discussed with Dr. Forde Radon, DO 04/25/2015, 11:13 AM PGY-2, [place]

## 2015-04-25 NOTE — Patient Instructions (Signed)
Erythema Nodosum  Erythema nodosum is a skin condition in which patches of fat under the skin of the lower legs become inflamed. This causes painful bumps (nodules) to form.  CAUSES  Common causes of this condition include:   Infections.   Certain medicines, especially birth control pills, penicillin, and sulfa medicines.  Other causes include:   Pregnancy.   Certain inflammatory conditions, including Lupus, Crohn's disease, and thyroid conditions.  In some cases, the cause may not be known.  RISK FACTORS  This condition is more likely to develop in young adult women.  SYMPTOMS  The main symptom of this condition is large nodules that look like raised bruises and are tender to the touch. These nodules usually appear on the shins, but they may also appear on the arms or the trunk. They gradually change in color from pink to brown, and they leave a dark mark that clears up in several months.  Other symptoms include:   Fever.   Fatigue.   Joint pain.   Itchiness.  DIAGNOSIS  This condition is diagnosed based on symptoms. To find the underlying condition that caused the erythema nodosum, your health care provider may also do a physical exam, X-rays, and blood tests.  TREATMENT  Treatment for this condition depends on the cause. The nodules usually go away with treatment of the underlying condition. Any pain or discomfort may be treated with:   Anti-inflammatory medicines.   Bed rest.   Raising (elevating) the affected area.   Cool compresses.  In some cases, steroids and potassium iodide tablets may be given.  HOME CARE INSTRUCTIONS   Take medicines only as directed by your health care provider.   Stay in bed for as long as directed by your health care provider.   Until your symptoms go away, limit any exercising that makes you breathe harder and faster (vigorous).   Elevate the affected leg as directed by your health care provider.   Apply cool compresses to the affected area as directed by your health  care provider.  SEEK MEDICAL CARE IF:   Your symptoms are not improving.   You have a fever that does not go away.  SEEK IMMEDIATE MEDICAL CARE IF:   Your condition gets worse.   Your pain gets worse.   You have a sore throat.   You vomit repeatedly.     This information is not intended to replace advice given to you by your health care provider. Make sure you discuss any questions you have with your health care provider.     Document Released: 07/22/2004 Document Revised: 10/29/2014 Document Reviewed: 05/22/2014  Elsevier Interactive Patient Education 2016 Elsevier Inc[employer]

## 2015-04-29 ENCOUNTER — Encounter: Payer: Self-pay | Admitting: Family Medicine

## 2015-04-29 ENCOUNTER — Ambulatory Visit (INDEPENDENT_AMBULATORY_CARE_PROVIDER_SITE_OTHER): Payer: 59 | Admitting: Family Medicine

## 2015-04-29 VITALS — Ht 65.0 in | Wt 370.4 lb

## 2015-04-29 DIAGNOSIS — E119 Type 2 diabetes mellitus without complications: Secondary | ICD-10-CM | POA: Diagnosis not present

## 2015-04-29 DIAGNOSIS — E669 Obesity, unspecified: Secondary | ICD-10-CM | POA: Diagnosis not present

## 2015-04-29 MED ORDER — IBUPROFEN 600 MG PO TABS
600.0000 mg | ORAL_TABLET | Freq: Three times a day (TID) | ORAL | Status: DC | PRN
Start: 2015-04-29 — End: 2017-02-10

## 2015-04-29 NOTE — Telephone Encounter (Signed)
Changed medication to ibuprofen so no longer need prior authorization. Thanks!

## 2015-04-29 NOTE — Patient Instructions (Addendum)
-   Check out the Bariatric Surgery seminar at [name] (see brochure provided today).   - FBG goal: 100-120. Based on yesterday's intake, ways to make your food choices help you in managing weight include:  - Eat breakfast.  - Get some veg's at both lunch and dinner.  - Get 2 snacks in the day.   - Drink at least 8 cups of water per day.   - Limit fried foods!  - For now, let's consider 1600 calories to be a daily limit - for weight loss.  BUT, I don't recommend counting calories.   - Instead:  Obtain twice as many veg's as protein or carbohydrate foods for both lunch and dinner.  - This will automatically limit your calories if you use a plate that is 0-88" in diameter.   - Make a list of 7-10 meals that taste good, are relatively quick and easy to prepare, and that meet your nutritional needs.     Use this as a basis for shopping, so you can make one of these meals any time.  Email your list of meals for review.    Goals: 1. Eat at least 3 REAL meals and at least 1 snack per day.  Aim for no more than 5 hours between eating.  Eat breakfast within one hour of getting up.   - A REAL meal includes some source of protein, starch, and veg's and/or fruit.   2. Obtain twice as many veg's as protein or carbohydrate foods for both lunch and dinner. 3. At least 30 minutes of physical activity 5 days a week.    - Also: Step off the bus, and walk a bit whenever you can during your day.

## 2015-04-29 NOTE — Progress Notes (Signed)
Medical Nutrition Therapy:  Appt start time: 1000 end time:  1100.  Assessment:  Primary concerns today: Weight management and Blood sugar control.   Valerie Carter is considering pursuing bariatric surgery, and needs to do 3 months of supervised weight loss for eligibility.  She has been checking FBG (& occasional other times in the day) ~3 X wk.  FBG have usually been 160-170s.    Learning Readiness:  Ready  Barriers to learning/adherence to lifestyle change: Knee pain limits activity.  Can usually walk only ~15 min at a time.  New work schedule (6:10-9:45 AM and 1:50-5:50 PM) precludes access to water exercise.    Usual eating pattern includes 2 meals and 2 snacks per day. Frequent foods and beverages include 1 c coffee w/ flvr'd creamer (35 kcal, 6 g CHO), broc, potatoes, chx, pork chop, cabbage, squash, onions, omelettes, salsa, sour crm.  Avoided foods include chips, milk, ice crm.   Usual physical activity (started 2 wks ago) includes ~15 min walking most days of the wk, sometimes more than once a day.  Limited by knee pain.    We reviewed [name]'s intake from yesterday, and made comparisons to alternatives she came up with, showing her that she could include an additional meal and a snack, but still come up with fewer kcal IF she makes more careful choices: B (9 AM)-   1 c coffee w/ flvr'd creamer 35 Coffee, crmr, 2 eggs, 2 toast, 2 tsp marg, 2 tsp jelly 470  L (1 PM)-  1 c fried rice, 1 c ses (brd'd) chx, water 850 [place] sandw, 1 slc cheese, 1 t must, 1 t sal drsng, spin, 2 c broccoli, water 560  Snk ( PM)-  ---  1 c strawberries   50  D (7 PM)-  1 c fried rice, 1 c ses(brd'd) chx, water 850 1/2 c pintos, 2 c cabbage, 2 T fat back, 1 c potatoes 565 (425)    1735 kcal  1640 kcal                   24-hr recall: (Up at 7:30 AM [usually 4:30 or 5 AM, but no school yesterday) B (9 AM)-   1 c coffee w/ flvr'd creamer      35 Snk ( AM)-   --- L (1 PM)-  1 c fried rice, 1 c sesame (brd'd) chx,  water  850 Snk ( PM)-  --- D (7 PM)-  1 c fried rice, 1 c sesame (brd'd) chx, water  850 Snk ( PM)-  --- Typical day? Yes.  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  Takes DM med's at 9 PM and 10 AM.    Progress Towards Goal(s):  In progress.   Nutritional Diagnosis:  [place]-3.3 Overweight/obesity As related to energy imbalance.  As evidenced by BMI >60.    Intervention:  Nutrition education.  Handouts given during visit include:  AVS  Goals sheet  Table indicating estimated avg BG corresponding to A1C   Demonstrated degree of understanding via:  Teach Back   Monitoring/Evaluation:  Dietary intake, exercise, BG, and body weight in 4 week(s).

## 2015-05-27 ENCOUNTER — Ambulatory Visit: Payer: 59 | Admitting: Family Medicine

## 2015-06-04 ENCOUNTER — Telehealth: Payer: Self-pay | Admitting: Obstetrics and Gynecology

## 2015-06-04 NOTE — Telephone Encounter (Signed)
Handicapped sticker is about to expire. Needs from letter from Dr to take to [facility]. 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

## 2015-06-04 NOTE — Telephone Encounter (Signed)
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  Form on my desk and place in provider's box in the morning once I place a tracking form with it. Jazmin Hartsell,CMA

## 2015-06-05 NOTE — Telephone Encounter (Signed)
I need to see patient to assess need for handicap sticker. I looked back and have seen no prior notes about it. I would like to do my own evaluation of need. She can be added to my clinic on the 15th as an add on around 4pm. Please also print and give to patient copy of her sleep study.

## 2015-06-05 NOTE — Telephone Encounter (Signed)
Unfortunately I do not have any day clinic appointments. She can be seen by a different provider. I just want documentation in chart of why patient needs handicap sticker(cannot think of any qualifying indications). 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 Thanks

## 2015-06-05 NOTE — Telephone Encounter (Signed)
Spoke with patient and she is fine with making an appt to be evaluated.  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  She doesn't get off work until 5:30pm.  Patient will be out of school after the 22nd for the holidays.  States that her current runs out at the end of December.  Is there another time for patient to be seen or should we schedule her with someone else for this purpose?  Jazmin Hartsell,CMA

## 2015-06-06 NOTE — Telephone Encounter (Signed)
Patient scheduled an appt for Tuesday 06/10/15 with Dr. Mingo Amber.  Jazmin Hartsell,CMA

## 2015-06-10 ENCOUNTER — Ambulatory Visit (INDEPENDENT_AMBULATORY_CARE_PROVIDER_SITE_OTHER): Payer: 59 | Admitting: Family Medicine

## 2015-06-10 ENCOUNTER — Encounter: Payer: Self-pay | Admitting: Family Medicine

## 2015-06-10 VITALS — BP 120/55 | HR 85 | Temp 98.2°F | Ht 65.0 in | Wt 359.1 lb

## 2015-06-10 DIAGNOSIS — M170 Bilateral primary osteoarthritis of knee: Secondary | ICD-10-CM | POA: Diagnosis not present

## 2015-06-10 DIAGNOSIS — D509 Iron deficiency anemia, unspecified: Secondary | ICD-10-CM

## 2015-06-10 NOTE — Patient Instructions (Signed)
It was good to meet you today.  The safest recommendation for your knee pain is Tylenol.  For at least the next couple of days, I would schedule this.  Then drop back to as needed.  The recommended dose is 1000 mg every 8 hours, with the maximum daily dose: 3000 mg daily.  Placard completed.

## 2015-06-10 NOTE — Telephone Encounter (Signed)
Left message for patient that form is complete and ready for pick up.  Valerie Carter L, RN  

## 2015-06-10 NOTE — Assessment & Plan Note (Signed)
>>  ASSESSMENT AND PLAN FOR ARTHRITIS OF KNEE, DEGENERATIVE WRITTEN ON 06/10/2015 11:41 AM BY WALDEN, JEFFREY H, MD  Bilateral. Declined repeat steroid injection as she stated these no longer really help much anymore. She does not want to take an NSAID secondary to cardiac risk. We'll recommend Tylenol  for today. See instructions. Temporary Handicap placard for the next 6 months completed. We'll hold off on permanent placard as knee replacement will likely result in resolution of pain.

## 2015-06-10 NOTE — Assessment & Plan Note (Addendum)
Bilateral. Declined repeat steroid injection as she stated these no longer really help much anymore. She does not want to take an NSAID secondary to cardiac risk. We'll recommend Tylenol for today. See instructions. Temporary Handicap placard for the next 6 months completed. We'll hold off on permanent placard as knee replacement will likely result in resolution of pain.

## 2015-06-10 NOTE — Assessment & Plan Note (Signed)
Microcytic anemia noted on chart review today.   I am a little concerned in that she went for a period of time without having menses this year, and is now bleeding irregularly again.  Likely hyper-estrogen state secondary to obesity -- but deserves w/u for endometrial CA based on age and ongoing bleeding.   With history of fibroid tumors. Discussed with patient that I will discuss with PCP.  She should FU in next 2-3 months for more in depth conversation with PCP about this issue.

## 2015-06-10 NOTE — Progress Notes (Signed)
Subjective:    Valerie Carter is a 54 y.o. female who presents to [facility] today for handicap placard:  1.  Handicap placard: Patient has had these every 6 months or so. For knee pain. States she is only able to walk about 25 yards secondary to knee pain. Bilateral knee pain. Has been told in the past she has "bone-on-bone" and is in need of a need for replacement. She is working at losing weight. Pain in knees is worse when she is sitting for prolonged period of time or when she tries to go from sitting to standing. She reports requiring the use of assisted device such as a cane or buggy/scooter when she is shopping but refuses to use these secondary to not wanting to be seen with an assistive device. No acute worsening of her pain. Does occasionally have swelling in her knees.   ROS as above per HPI, otherwise neg.    The following portions of the patient's history were reviewed and updated as appropriate: allergies, current medications, past medical history, family and social history, and problem list. Patient is a nonsmoker.    PMH reviewed.  Past Medical History  Diagnosis Date  . Hyperlipidemia   . Hypertension   . Depression    No past surgical history on file.  Medications reviewed. Current Outpatient Prescriptions  Medication Sig Dispense Refill  . amLODipine (NORVASC) 5 MG tablet Take 1 tablet (5 mg total) by mouth daily. 90 tablet 3  . aspirin 81 MG chewable tablet Chew 81 mg by mouth daily.      Marland Kitchen atorvastatin (LIPITOR) 40 MG tablet Take 1 tablet (40 mg total) by mouth daily. 90 tablet 3  . benazepril (LOTENSIN) 40 MG tablet TAKE ONE TABLET BY MOUTH ONCE DAILY 90 tablet 1  . Blood Glucose Monitoring Suppl (ONE TOUCH ULTRA SYSTEM KIT) W/DEVICE KIT 1 kit by Does not apply route once. 1 each 0  . ferrous sulfate 325 (65 FE) MG tablet Take 1 tablet (325 mg total) by mouth daily with breakfast. 30 tablet 11  . gabapentin (NEURONTIN) 300 MG capsule One capsule daily. This may cause  drowsiness. Can increase to 2 caps daily if needed. 60 capsule 1  . glipiZIDE (GLUCOTROL) 5 MG tablet TAKE ONE TABLET BY MOUTH IN THE MORNING AND ONE-HALF TAB IN THE EVENING 120 tablet 2  . glucose blood test strip Use as instructed 100 each 12  . hydrochlorothiazide (HYDRODIURIL) 25 MG tablet Take 1 tablet (25 mg total) by mouth daily. Due for follow up of hypertension. 90 tablet 0  . ibuprofen (ADVIL,MOTRIN) 600 MG tablet Take 1 tablet (600 mg total) by mouth every 8 (eight) hours as needed. 30 tablet 0  . metFORMIN (GLUCOPHAGE) 1000 MG tablet Take 1 tablet (1,000 mg total) by mouth 2 (two) times daily with a meal. 60 tablet 3  . sulfamethoxazole-trimethoprim (BACTRIM DS) 800-160 MG per tablet Take 2 tablets by mouth 2 (two) times daily. 40 tablet 0  . Vitamin D, Ergocalciferol, (DRISDOL) 50000 UNITS CAPS capsule Take 1 capsule (50,000 Units total) by mouth every 7 (seven) days. Have recheck of level in clinic after 8 week. 8 capsule 0   No current facility-administered medications for this visit.     Objective:   Physical Exam BP 120/55 mmHg  Pulse 85  Temp(Src) 98.2 F (36.8 C) (Oral)  Ht 5' 5" (1.651 m)  Wt 359 lb 1.6 oz (162.887 kg)  BMI 59.76 kg/m2  LMP 04/29/2015 Gen:  Alert, cooperative  patient who appears stated age in no acute distress.  Vital signs reviewed. MSK: Bilateral medial and lateral joint line pain for both knees. No effusion noted today. No redness or warmth. She does ambulate with a limp secondary to pain. Strength 4-5 bilateral knee extension and flexion.  No results found for this or any previous visit (from the past 72 hour(s)).

## 2015-06-17 ENCOUNTER — Ambulatory Visit: Payer: 59 | Admitting: Family Medicine

## 2015-07-04 ENCOUNTER — Encounter: Payer: Self-pay | Admitting: *Deleted

## 2015-07-04 NOTE — Progress Notes (Signed)
Spoke with regarding her new insurance information required so I could PA for her sleep study.  She now has [employer] and the ID# is [id number] and provider line # is [phone].   I did speak with Monique H and no PA was required for this procedure.  Ref# is [id number].  Note placed on appt line.  Patient was made aware that I would contact her if PA was not given. Jazmin Hartsell,CMA

## 2015-07-08 ENCOUNTER — Ambulatory Visit: Payer: 59 | Admitting: Family Medicine

## 2015-07-09 ENCOUNTER — Ambulatory Visit: Payer: 59 | Admitting: Obstetrics and Gynecology

## 2015-07-11 ENCOUNTER — Ambulatory Visit (HOSPITAL_BASED_OUTPATIENT_CLINIC_OR_DEPARTMENT_OTHER): Payer: BLUE CROSS/BLUE SHIELD | Attending: Family Medicine | Admitting: *Deleted

## 2015-07-11 DIAGNOSIS — G4733 Obstructive sleep apnea (adult) (pediatric): Secondary | ICD-10-CM

## 2015-07-11 DIAGNOSIS — Z79899 Other long term (current) drug therapy: Secondary | ICD-10-CM | POA: Diagnosis not present

## 2015-07-11 DIAGNOSIS — R0683 Snoring: Secondary | ICD-10-CM | POA: Insufficient documentation

## 2015-07-11 DIAGNOSIS — Z7984 Long term (current) use of oral hypoglycemic drugs: Secondary | ICD-10-CM | POA: Diagnosis not present

## 2015-07-13 DIAGNOSIS — G4733 Obstructive sleep apnea (adult) (pediatric): Secondary | ICD-10-CM

## 2015-07-13 NOTE — Progress Notes (Signed)
Patient Name: Valerie Carter, Valerie Carter Date: 07/11/2015 Gender: Female D.O.B: January 18, 1961 Age (years): 54 Referring Provider: Esmond Camper Height (inches): 41 Interpreting Physician: Baird Lyons MD, ABSM Weight (lbs): 360 RPSGT: Gerhard Perches BMI: 60 MRN: [id number] Neck Size: 16.50 CLINICAL INFORMATION Sleep Study Type: Split Night CPAP Indication for sleep study: OSA Epworth Sleepiness Score: 4  SLEEP STUDY TECHNIQUE As per the AASM Manual for the Scoring of Sleep and Associated Events v2.3 (April 2016) with a hypopnea requiring 4% desaturations. The channels recorded and monitored were frontal, central and occipital EEG, electrooculogram (EOG), submentalis EMG (chin), nasal and oral airflow, thoracic and abdominal wall motion, anterior tibialis EMG, snore microphone, electrocardiogram, and pulse oximetry. Continuous positive airway pressure (CPAP) was initiated when the patient met split night criteria and was titrated according to treat sleep-disordered breathing.  MEDICATIONS Medications taken by the patient : Benezipril, METFORMIN  Medications administered by patient during sleep study : No sleep medicine administered.  RESPIRATORY PARAMETERS Diagnostic Total AHI (/hr): 10.4 RDI (/hr): 13.7 OA Index (/hr): 5.7 CA Index (/hr): 0.7 REM AHI (/hr): 55.4 NREM AHI (/hr): 8.6 Supine AHI (/hr): 0.0 Non-supine AHI (/hr): 10.45 Min O2 Sat (%): 75.00 Mean O2 (%): 95.68 Time below 88% (min): 2.9   Titration Optimal Pressure (cm): 13 AHI at Optimal Pressure (/hr): 0.0 Min O2 at Optimal Pressure (%): 94.0 Supine % at Optimal (%): 100 Sleep % at Optimal (%): 86   SLEEP ARCHITECTURE The recording time for the entire night was 374.8 minutes. During a baseline period of 203.9 minutes, the patient slept for 167.0 minutes in REM and nonREM, yielding a sleep efficiency of 81.9%. Sleep onset after lights out was 11.9 minutes with a REM latency of 133.5 minutes. The patient spent 4.79% of  the night in stage N1 sleep, 66.17% in stage N2 sleep, 25.15% in stage N3 and 3.89% in REM. During the titration period of 168.3 minutes, the patient slept for 93.0 minutes in REM and nonREM, yielding a sleep efficiency of 55.3%. Sleep onset after CPAP initiation was 36.9 minutes with a REM latency of 73.5 minutes. The patient spent 10.75% of the night in stage N1 sleep, 59.68% in stage N2 sleep, 0.00% in stage N3 and 29.57% in REM.  CARDIAC DATA The 2 lead EKG demonstrated sinus rhythm. The mean heart rate was 83.61 beats per minute. Other EKG findings include: None.  LEG MOVEMENT DATA The total Periodic Limb Movements of Sleep (PLMS) were 0. The PLMS index was 0.00 .  IMPRESSIONS - Mild obstructive sleep apnea occurred during the diagnostic portion of the study (AHI = 10.4 /hour). An optimal PAP pressure was selected for this patient ( 13 cm of water) - No significant central sleep apnea occurred during the diagnostic portion of the study (CAI = 0.7/hour). - Mild oxygen desaturation was noted during the diagnostic portion of the study (Min O2 = 75.00%). - The patient snored with Moderate snoring volume during the diagnostic portion of the study. - No cardiac abnormalities were noted during this study. - Clinically significant periodic limb movements did not occur during sleep.  DIAGNOSIS - Obstructive Sleep Apnea (327.23 [G47.33 ICD-10])  RECOMMENDATIONS - Trial of CPAP therapy on 13 cm H2O with a Small size Fisher&Paykel Full Face Mask Simplus mask and heated humidification. - A fitted oral mandibular advancement device might be an alternative to CPAP for scores in this range - Avoid alcohol, sedatives and other CNS depressants that may worsen sleep apnea and disrupt normal sleep architecture. - Sleep hygiene  should be reviewed to assess factors that may improve sleep quality. - Weight management and regular exercise should be initiated or continued.  Deneise Lever Diplomate,  American Board of Sleep Medicine  ELECTRONICALLY SIGNED ON:  07/13/2015, 4:36 PM [place] PH: [phone]   FX: [phone] [place]

## 2015-07-16 ENCOUNTER — Ambulatory Visit (INDEPENDENT_AMBULATORY_CARE_PROVIDER_SITE_OTHER): Payer: BLUE CROSS/BLUE SHIELD | Admitting: Obstetrics and Gynecology

## 2015-07-16 VITALS — BP 150/75 | HR 88 | Temp 98.4°F | Ht 65.0 in | Wt 365.8 lb

## 2015-07-16 DIAGNOSIS — D509 Iron deficiency anemia, unspecified: Secondary | ICD-10-CM | POA: Diagnosis not present

## 2015-07-16 DIAGNOSIS — M170 Bilateral primary osteoarthritis of knee: Secondary | ICD-10-CM | POA: Diagnosis not present

## 2015-07-16 DIAGNOSIS — D259 Leiomyoma of uterus, unspecified: Secondary | ICD-10-CM

## 2015-07-16 DIAGNOSIS — E669 Obesity, unspecified: Secondary | ICD-10-CM

## 2015-07-16 DIAGNOSIS — I10 Essential (primary) hypertension: Secondary | ICD-10-CM

## 2015-07-16 MED ORDER — TRAMADOL HCL 50 MG PO TABS: 50.0000 mg | ORAL_TABLET | Freq: Three times a day (TID) | ORAL | Status: DC | PRN

## 2015-07-16 MED ORDER — AMLODIPINE BESYLATE 5 MG PO TABS
5.0000 mg | ORAL_TABLET | Freq: Every day | ORAL | Status: DC
Start: 2015-07-16 — End: 2017-02-08

## 2015-07-16 NOTE — Progress Notes (Signed)
Subjective: No chief complaint on file.    HPI: Valerie Carter is a 55 y.o. presenting to clinic today to discuss the following:  #Joint Pain: -continues to endorse joint pain mainly in knees but intermittently in her hips -tried cortisone shots and they were painful- didn't really help -feels like getting worse -wants something to help -has followed with ortho before who told her she would need to lose weight in order to get a knee replacement -last imaging in 2012 showing DJD -states she can't function as well as before -trying to lose weight but hard since she cant move well -Has tried ibuprofen, mobic, and other NSAIDs with side effects. Tylenol not strong enough.   #Anemia: -concern at last visit by provider that she is till menstruating at her age -patient says she is now worried -no family history of female reproductive cancers -states she has one time when she was off her period for 2 months but otherwise she gets a menstrual cycle monthly. -h/o fibroids ROS: denies abdominal pain, dysuria, fevers, chest pain, SOB, cold intolerance   ROS noted in HPI.  Past Medical, Surgical, Social, and Family History Reviewed & Updated per EMR.   Objective: BP 150/75 mmHg  Pulse 88  Temp(Src) 98.4 F (36.9 C) (Oral)  Ht 5\' 5"  (1.651 m)  Wt 365 lb 12.8 oz (165.926 kg)  BMI 60.87 kg/m2  LMP 07/16/2015 Vitals and nursing notes reviewed  Physical Exam  Constitutional: She is well-developed, well-nourished, and in no distress.  Morbidly obese  Cardiovascular: Normal rate.   Pulmonary/Chest: Effort normal.  Abdominal: Soft. Bowel sounds are normal. She exhibits no distension. There is no tenderness.  Musculoskeletal: She exhibits no edema.       Right knee: She exhibits decreased range of motion and bony tenderness.       Left knee: She exhibits decreased range of motion and bony tenderness.  Neurological: She is alert.  Skin: Skin is warm and dry.     Assessment/Plan: 1. Primary osteoarthritis of both knees Continues to have pain in her bilateral knees from primary OA. Disucsssed with patient that pain will likely never go away unless she has knee replacement. Our goal at this time should be quality of life and functionality. She declined injections today. Will prescribe tramadol for pain. Advised patient to follow-up with orthopedic doctor to for further treatment options that do not include surgery.   2. Uterine leiomyoma, unspecified location H/o fibroids. Unable to be well visualized on last imagine in 2007. Will obtain new imaging. Fibroids could be contributing to her chronic anemia.  - US Pelvis Complete; Future - US Transvaginal Non-OB; Future  3. Essential hypertension Medication refill today. BP elevated. She has been out of Norvasc for over a week. Usually well controlled on medications. - amLODipine (NORVASC) 5 MG tablet; Take 1 tablet (5 mg total) by mouth daily.  Dispense: 90 tablet; Refill: 3  4. Iron deficiency anemia Will begin work-up of her anemia by first getting pelvic imaging to look for any endometrial thickening. Discussed with patient possible endometrial biopsy as well. She is agreeable. No current symptoms from her anemia.  - US Pelvis Complete; Future - US Transvaginal Non-OB; Future  5. Obesity Discussed losing weight to help with knee pain. Advised on dietary modifications first since she is not able to be as mobile as she would like. Also discussed exercises that are possible that would be low impact on her knees. Handout given.   Follow-up  in about 6 weeks  Orders Placed This Encounter  Procedures  . US Pelvis Complete    Standing Status: Future     Number of Occurrences:      Standing Expiration Date: 09/12/2016    Order Specific Question:  Reason for Exam (SYMPTOM  OR DIAGNOSIS REQUIRED)    Answer:  fibroids, anemia, bleeding    Order Specific Question:  Preferred imaging location?    Answer:   Internal  . US Transvaginal Non-OB    Standing Status: Future     Number of Occurrences:      Standing Expiration Date: 09/12/2016    Order Specific Question:  Reason for Exam (SYMPTOM  OR DIAGNOSIS REQUIRED)    Answer:  fibroids, anemia, bleeding    Order Specific Question:  Preferred imaging location?    Answer:  Internal    Meds ordered this encounter  Medications  . amLODipine (NORVASC) 5 MG tablet    Sig: Take 1 tablet (5 mg total) by mouth daily.    Dispense:  90 tablet    Refill:  3  . traMADol (ULTRAM) 50 MG tablet    Sig: Take 1 tablet (50 mg total) by mouth every 8 (eight) hours as needed for moderate pain or severe pain.    Dispense:  60 tablet    Refill:  0     Luiz Blare, DO 07/16/2015, 11:11 AM PGY-2, [place]

## 2015-07-16 NOTE — Patient Instructions (Signed)
Make sure [place] is scheduled  Try the tramadol for pain. Can also call and schedule with orthopedic doctors who saw you previously about alternatives to knee pain.  Best thing for knee pain is weight loss.   Medications refilled  Follow-up in 2 months   Calorie Counting for Weight Loss Calories are energy you get from the things you eat and drink. Your body uses this energy to keep you going throughout the day. The number of calories you eat affects your weight. When you eat more calories than your body needs, your body stores the extra calories as fat. When you eat fewer calories than your body needs, your body burns fat to get the energy it needs. Calorie counting means keeping track of how many calories you eat and drink each day. If you make sure to eat fewer calories than your body needs, you should lose weight. In order for calorie counting to work, you will need to eat the number of calories that are right for you in a day to lose a healthy amount of weight per week. A healthy amount of weight to lose per week is usually 1-2 lb (0.5-0.9 kg). A dietitian can determine how many calories you need in a day and give you suggestions on how to reach your calorie goal.  WHAT IS MY MY PLAN? My goal is to have __________ calories per day.  If I have this many calories per day, I should lose around __________ pounds per week. WHAT DO I NEED TO KNOW ABOUT CALORIE COUNTING? In order to meet your daily calorie goal, you will need to:  Find out how many calories are in each food you would like to eat. Try to do this before you eat.  Decide how much of the food you can eat.  Write down what you ate and how many calories it had. Doing this is called keeping a food log. WHERE DO I FIND CALORIE INFORMATION? The number of calories in a food can be found on a Nutrition Facts label. Note that all the information on a label is based on a specific serving of the food. If a food does not have a Nutrition  Facts label, try to look up the calories online or ask your dietitian for help. HOW DO I DECIDE HOW MUCH TO EAT? To decide how much of the food you can eat, you will need to consider both the number of calories in one serving and the size of one serving. This information can be found on the Nutrition Facts label. If a food does not have a Nutrition Facts label, look up the information online or ask your dietitian for help. Remember that calories are listed per serving. If you choose to have more than one serving of a food, you will have to multiply the calories per serving by the amount of servings you plan to eat. For example, the label on a package of bread might say that a serving size is 1 slice and that there are 90 calories in a serving. If you eat 1 slice, you will have eaten 90 calories. If you eat 2 slices, you will have eaten 180 calories. HOW DO I KEEP A FOOD LOG? After each meal, record the following information in your food log:  What you ate.  How much of it you ate.  How many calories it had.  Then, add up your calories. Keep your food log near you, such as in a small notebook in  your pocket. Another option is to use a mobile app or website. Some programs will calculate calories for you and show you how many calories you have left each time you add an item to the log. WHAT ARE SOME CALORIE COUNTING TIPS?  Use your calories on foods and drinks that will fill you up and not leave you hungry. Some examples of this include foods like nuts and nut butters, vegetables, lean proteins, and high-fiber foods (more than 5 g fiber per serving).  Eat nutritious foods and avoid empty calories. Empty calories are calories you get from foods or beverages that do not have many nutrients, such as candy and soda. It is better to have a nutritious high-calorie food (such as an avocado) than a food with few nutrients (such as a bag of chips).  Know how many calories are in the foods you eat most  often. This way, you do not have to look up how many calories they have each time you eat them.  Look out for foods that may seem like low-calorie foods but are really high-calorie foods, such as baked goods, soda, and fat-free candy.  Pay attention to calories in drinks. Drinks such as sodas, specialty coffee drinks, alcohol, and juices have a lot of calories yet do not fill you up. Choose low-calorie drinks like water and diet drinks.  Focus your calorie counting efforts on higher calorie items. Logging the calories in a garden salad that contains only vegetables is less important than calculating the calories in a milk shake.  Find a way of tracking calories that works for you. Get creative. Most people who are successful find ways to keep track of how much they eat in a day, even if they do not count every calorie. WHAT ARE SOME PORTION CONTROL TIPS?  Know how many calories are in a serving. This will help you know how many servings of a certain food you can have.  Use a measuring cup to measure serving sizes. This is helpful when you start out. With time, you will be able to estimate serving sizes for some foods.  Take some time to put servings of different foods on your favorite plates, bowls, and cups so you know what a serving looks like.  Try not to eat straight from a bag or box. Doing this can lead to overeating. Put the amount you would like to eat in a cup or on a plate to make sure you are eating the right portion.  Use smaller plates, glasses, and bowls to prevent overeating. This is a quick and easy way to practice portion control. If your plate is smaller, less food can fit on it.  Try not to multitask while eating, such as watching TV or using your computer. If it is time to eat, sit down at a table and enjoy your food. Doing this will help you to start recognizing when you are full. It will also make you more aware of what and how much you are eating. HOW CAN I CALORIE COUNT  WHEN EATING OUT?  Ask for smaller portion sizes or child-sized portions.  Consider sharing an entree and sides instead of getting your own entree.  If you get your own entree, eat only half. Ask for a box at the beginning of your meal and put the rest of your entree in it so you are not tempted to eat it.  Look for the calories on the menu. If calories are listed, choose the lower  calorie options.  Choose dishes that include vegetables, fruits, whole grains, low-fat dairy products, and lean protein. Focusing on smart food choices from each of the 5 food groups can help you stay on track at restaurants.  Choose items that are boiled, broiled, grilled, or steamed.  Choose water, milk, unsweetened iced tea, or other drinks without added sugars. If you want an alcoholic beverage, choose a lower calorie option. For example, a regular margarita can have up to 700 calories and a glass of wine has around 150.  Stay away from items that are buttered, battered, fried, or served with cream sauce. Items labeled "crispy" are usually fried, unless stated otherwise.  Ask for dressings, sauces, and syrups on the side. These are usually very high in calories, so do not eat much of them.  Watch out for salads. Many people think salads are a healthy option, but this is often not the case. Many salads come with bacon, fried chicken, lots of cheese, fried chips, and dressing. All of these items have a lot of calories. If you want a salad, choose a garden salad and ask for grilled meats or steak. Ask for the dressing on the side, or ask for olive oil and vinegar or lemon to use as dressing.  Estimate how many servings of a food you are given. For example, a serving of cooked rice is  cup or about the size of half a tennis ball or one cupcake wrapper. Knowing serving sizes will help you be aware of how much food you are eating at restaurants. The list below tells you how big or small some common portion sizes are  based on everyday objects.  1 oz--4 stacked dice.  3 oz--1 deck of cards.  1 tsp--1 dice.  1 Tbsp-- a Ping-Pong ball.  2 Tbsp--1 Ping-Pong ball.   cup--1 tennis ball or 1 cupcake wrapper.  1 cup--1 baseball.   This information is not intended to replace advice given to you by your health care provider. Make sure you discuss any questions you have with your health care provider.   Document Released: 06/14/2005 Document Revised: 07/05/2014 Document Reviewed: 04/19/2013 Elsevier Interactive Patient Education 2016 [employer].    Endometrial Biopsy Endometrial biopsy is a procedure in which a tissue sample is taken from inside the uterus. The tissue sample is then looked at under a microscope to see if the tissue is normal or abnormal. The endometrium is the lining of the uterus. This procedure helps determine where you are in your menstrual cycle and how hormone levels are affecting the lining of the uterus. This procedure may also be used to evaluate uterine bleeding or to diagnose endometrial cancer, tuberculosis, polyps, or inflammatory conditions.  LET [facility] CARE PROVIDER KNOW ABOUT:  Any allergies you have.  All medicines you are taking, including vitamins, herbs, eye drops, creams, and over-the-counter medicines.  Previous problems you or members of your family have had with the use of anesthetics.  Any blood disorders you have.  Previous surgeries you have had.  Medical conditions you have.  Possibility of pregnancy. RISKS AND COMPLICATIONS Generally, this is a safe procedure. However, as with any procedure, complications can occur. Possible complications include:  Bleeding.  Pelvic infection.  Puncture of the uterine wall with the biopsy device (rare). BEFORE THE PROCEDURE   Keep a record of your menstrual cycles as directed by your health care provider. You may need to schedule your procedure for a specific time in your cycle.  You may want to bring  a sanitary pad to wear home after the procedure.  Arrange for someone to drive you home after the procedure if you will be given a medicine to help you relax (sedative). PROCEDURE   You may be given a sedative to relax you.  You will lie on an exam table with your feet and legs supported as in a pelvic exam.  Your health care provider will insert an instrument (speculum) into your vagina to see your cervix.  Your cervix will be cleansed with an antiseptic solution. A medicine (local anesthetic) will be used to numb the cervix.  A forceps instrument (tenaculum) will be used to hold your cervix steady for the biopsy.  A thin, rodlike instrument (uterine sound) will be inserted through your cervix to determine the length of your uterus and the location where the biopsy sample will be removed.  A thin, flexible tube (catheter) will be inserted through your cervix and into the uterus. The catheter is used to collect the biopsy sample from your endometrial tissue.  The catheter and speculum will then be removed, and the tissue sample will be sent to a lab for examination. AFTER THE PROCEDURE  You will rest in a recovery area until you are ready to go home.  You may have mild cramping and a small amount of vaginal bleeding for a few days after the procedure. This is normal.  Make sure you find out how to get your test results.   This information is not intended to replace advice given to you by your health care provider. Make sure you discuss any questions you have with your health care provider.   Document Released: 10/15/2004 Document Revised: 02/14/2013 Document Reviewed: 11/29/2012 Elsevier Interactive Patient Education [employer].

## 2015-07-18 ENCOUNTER — Encounter: Payer: Self-pay | Admitting: Obstetrics and Gynecology

## 2015-07-23 ENCOUNTER — Telehealth: Payer: Self-pay | Admitting: Obstetrics and Gynecology

## 2015-07-23 DIAGNOSIS — G4733 Obstructive sleep apnea (adult) (pediatric): Secondary | ICD-10-CM

## 2015-07-23 NOTE — Telephone Encounter (Signed)
Will forward to MD to interpret sleep study results. Keeyon Privitera,CMA

## 2015-07-23 NOTE — Telephone Encounter (Signed)
Patient is wanting to be called with the results from her sleep study.  She says that it is ok to leave a message on her phone if she is unable to answer.

## 2015-07-24 ENCOUNTER — Encounter: Payer: Self-pay | Admitting: Obstetrics and Gynecology

## 2015-07-24 NOTE — Telephone Encounter (Signed)
Please inform patient she does have sleep apnea. Order placed for her to get CPAP (please include sleep study as home health likes to see all the requirements for CPAP). Please reassure patient that this should not limit her capabilities of driving as long as her symptoms are controlled (ie - daytime sleepiness). Thanks!

## 2015-07-24 NOTE — Telephone Encounter (Signed)
Letter made and will be mailed to patient. She had mild OSA. The recommendation was for a CPAP. They have recommended a different type of mask for her to help with symptoms. She can be fitted for it and try it out; otherwise, it is her choice to go without CPAP.

## 2015-07-24 NOTE — Telephone Encounter (Signed)
Spoke with patient and she aware of results.  She would like to know the severity of her sleep apnea.  She states that she was fitted for cpap years ago and she couldn't sleep with it.  She informed me that she is a stomach or side sleeper.  Patient states that she would prefer to not use a cpap.  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  She has one on file from Dr. Dianah Field in 2015 that is similar to what she needs for her recertification. Leighanne Adolph,CMA

## 2015-07-25 NOTE — Telephone Encounter (Signed)
Patient is aware of this and declines a cpap at this time.  [place] with letter being mailed to her. Jazmin Hartsell,CMA

## 2015-08-01 ENCOUNTER — Ambulatory Visit (HOSPITAL_COMMUNITY)
Admission: RE | Admit: 2015-08-01 | Discharge: 2015-08-01 | Disposition: A | Discharge status: Home / Self Care | Payer: BLUE CROSS/BLUE SHIELD | Source: Ambulatory Visit | Attending: Family Medicine | Admitting: Family Medicine

## 2015-08-01 DIAGNOSIS — D509 Iron deficiency anemia, unspecified: Secondary | ICD-10-CM | POA: Insufficient documentation

## 2015-08-01 DIAGNOSIS — D259 Leiomyoma of uterus, unspecified: Secondary | ICD-10-CM | POA: Diagnosis not present

## 2015-08-07 ENCOUNTER — Encounter: Payer: Self-pay | Admitting: Obstetrics and Gynecology

## 2015-08-12 ENCOUNTER — Encounter: Payer: Self-pay | Admitting: Family Medicine

## 2015-08-12 ENCOUNTER — Ambulatory Visit (INDEPENDENT_AMBULATORY_CARE_PROVIDER_SITE_OTHER): Payer: BLUE CROSS/BLUE SHIELD | Admitting: Family Medicine

## 2015-08-12 VITALS — Ht 65.0 in | Wt 362.1 lb

## 2015-08-12 DIAGNOSIS — E119 Type 2 diabetes mellitus without complications: Secondary | ICD-10-CM | POA: Diagnosis not present

## 2015-08-12 NOTE — Patient Instructions (Addendum)
-   TAKE YOUR DIABETES MED's CONSISTENTLY.   - Your iron supplements:  Take at least 2 hr after eating (or at least 1 hr before eating).  You'll get best absorption if you take at least 200 mg vitamin C.   - Your last Vit D test showed a very low 15 ng/mL.  I suggest you start supplementing 4000 IU vit D3 per day, and get your level checked next time you see Dr. Gerarda Fraction.   - I suggest you stop using agave syrup b/c it is mostly fructose, which if consumed in any more than very small quantities, promotes fatty acid synthesis in the liver.   - Turmeric for joint pain/inflammation? (See Roselyn Reef at [facility] on [address] - Attend the Bariatric seminar:  Tue, 2/21 6 -8 PM at [facility] (Go to the left as you enter [facility]).    Goals: 1. Eat at least 3 REAL meals (protein, starch, veg's and/or fruit) and at least 1 snack per day.  Aim for no more than 5 hours between eating. Eat SOMEthing the first after getting up.  2. Obtain twice as many veg's as protein or carbohydrate foods for both lunch and dinner. 3. At least 30 minutes of physical activity 5 days a week (150 min/week). 4. EMAIL [email or web address] TO LET ME KNOW HOW IT'S GOING AT LEAST EVERY FRIDAY.   Place your Goals sheet on the refrigerator; bring to follow-up.

## 2015-08-12 NOTE — Progress Notes (Signed)
Medical Nutrition Therapy:  Appt start time: 1000 end time:  1100.  Assessment:  Primary concerns today: Weight management and Blood sugar control.   Valerie Carter has been eating more fruits and veg's; bought a portions plate to help with this.  She has been doing intermittent chair exercises (total of ~15 min) about every day.  Pain (all over) is a limiting factor for physical activity.  Uterine fibroids are causing increased bleeding (endometrial biopsy scheduled for Mar 2), which has resulted in low Fe levels.  Started Fe SO4 325 mg/day last month.    FBG have been running ~180 in contrast to much lower levels a couple of months ago.  Has not been taking metformin or glipizide consistently until last couple of days, having read of undesirable (e.g., liver) effects of these med's on the Internet.  We discussed this today, and [name] agreed to continue consistently taking her glipizide and metformin.    [name] is extremely frustrated at her inability to lose weight (although wt today is 8 lb down from 3 mo ago).  She feels that her food intake is good most of the time, although she has not completed her Goals Sheet, so her assessment of this may be inaccurate.  [name]'s pain is pretty much constant, and no doubt makes even small efforts at behavior (diet and exercise) challenging; expending extra effort and time on others too often is at the expense of [name]'s own self-care.  I encouraged her to make her own needs more of a priority even if it means saying no to family members and friends.   24-hr recall suggests an intake of ~1360:  (Up at 4:30 AM) B (5:30 AM)-  1 c coffee, 1 T Fr vanilla creamer, 2 eggs, 3/4 oz Amer chs, 1 banana 390 Snk (11 AM)-  1/4 c cashews, water w/ lemon, mint, & agave    220 L (1 PM)-  1 1/2 c stir-fried cabbage, 1 c sq & onions, 3 oz ribs, water   450 Snk ( PM)-  --- D (6:30 PM)-  1 c stir-fried cabbage, 1 c sq & onions, 12 oz beer    300 Snk ( PM)-  --- Typical day? Yes.   although usually has afternoon snack.   Progress Towards Goal(s):  In progress.   Nutritional Diagnosis:  [name]-3.3 Overweight/obesity As related to energy imbalance.  As evidenced by BMI >60.    Intervention:  Nutrition education.  Handouts given during visit include:  AVS  Goals sheet  Demonstrated degree of understanding via:  Teach Back   Monitoring/Evaluation:  Dietary intake, exercise, BG, and body weight in 4 week(s).

## 2015-08-14 ENCOUNTER — Ambulatory Visit (INDEPENDENT_AMBULATORY_CARE_PROVIDER_SITE_OTHER): Payer: BLUE CROSS/BLUE SHIELD | Admitting: Family Medicine

## 2015-08-14 ENCOUNTER — Encounter: Payer: Self-pay | Admitting: Family Medicine

## 2015-08-14 VITALS — BP 137/80 | HR 86 | Temp 97.8°F | Ht 65.0 in | Wt 365.9 lb

## 2015-08-14 DIAGNOSIS — R309 Painful micturition, unspecified: Secondary | ICD-10-CM | POA: Diagnosis not present

## 2015-08-14 DIAGNOSIS — K5900 Constipation, unspecified: Secondary | ICD-10-CM

## 2015-08-14 LAB — POCT URINALYSIS DIPSTICK
Bilirubin, UA: NEGATIVE
GLUCOSE UA: NEGATIVE
Ketones, UA: NEGATIVE
Leukocytes, UA: NEGATIVE
NITRITE UA: NEGATIVE
PROTEIN UA: NEGATIVE
RBC UA: NEGATIVE
Spec Grav, UA: 1.02
UROBILINOGEN UA: 0.2
pH, UA: 5.5

## 2015-08-14 MED ORDER — POLYETHYLENE GLYCOL 3350 17 GM/SCOOP PO POWD: 17.0000 g | Freq: Every day | ORAL | Status: DC

## 2015-08-14 NOTE — Progress Notes (Signed)
   Subjective: CC: Dysuria [id number] D Emminger is a 55 y.o. female presenting to clinic today for same day appointment. PCP: Luiz Blare, DO Concerns today include:  1. Dysuria Patient reports that discomfort started about 2 weeks ago.  She notes pain before and after voiding.  She notes back pain in her low back and hips that has been worse over the last 2-3 days.  She notes that her period is due and she experience pain similar to this during that time.  No fevers, chills, nausea, vomiting, possibility of pregnancy, odor to urine, hematuria.  Endorses frequency and urgency.  BG has been in 180-200's over the last couple of weeks.  2. Constipation Patient notes constipation.  She often has to strain.  She takes Iron and she attributes her symptoms to this.  Denies hematochezia, nausea or vomiting.  She has been taking a stool softening tea with little improvement.  Social History Reviewed: non smoker. FamHx and MedHx reviewed.  Please see EMR.  ROS: Per HPI  Objective: Office vital signs reviewed. BP 137/80 mmHg  Pulse 86  Temp(Src) 97.8 F (36.6 C) (Oral)  Ht 5\' 5"  (1.651 m)  Wt 365 lb 14.4 oz (165.971 kg)  BMI 60.89 kg/m2  LMP 07/16/2015  Physical Examination:  General: Awake, alert, obese, No acute distress HEENT: Normal, MMM Cardio: regular rate and rhythm Pulm: clear to auscultation bilaterally, no wheezes, rhonchi or rales GI/GU: soft, obese, non-tender, non-distended, bowel sounds present x4, no suprapubic TTP, no CVA TTP  Results for orders placed or performed in visit on 08/14/15 (from the past 24 hour(s))  Urinalysis Dipstick     Status: None   Collection Time: 08/14/15 10:55 AM  Result Value Ref Range   Color, UA YELLOW    Clarity, UA CLEAR    Glucose, UA NEG    Bilirubin, UA NEG    Ketones, UA NEG    Spec Grav, UA 1.020    Blood, UA NEG    pH, UA 5.5    Protein, UA NEG    Urobilinogen, UA 0.2    Nitrite, UA NEG    Leukocytes, UA Negative Negative   Narrative   Biochemical negative, microscopic not indicated   Assessment/ Plan: 55 y.o. female   1. Pain emptying bladder. No evidence of UTI on UA.  ?referred pain from fibroids vs constipation related discomfort vs interstitial cystitis - Urinalysis Dipstick - Return precautions reviewed - Follow up with PCP as scheduled or as needed  2. Constipation, unspecified constipation type - Increase fiber - polyethylene glycol powder (GLYCOLAX/MIRALAX) powder; Take 17 g by mouth daily.  Dispense: 3350 g; Refill: 1 - Clean out instructions provided. - Return precautions reviewed  Janora Norlander, DO PGY-2, [place]

## 2015-08-14 NOTE — Patient Instructions (Addendum)
No evidence of UTI.  I would focus on regulating bowel movements.  If you continue to have pain with urination, come back for evaluation.  You may need a gynecology or urology referral.  Thank you for coming in to clinic today.  1. Your symptoms are consistent with Constipation, likely cause of your General Abdominal Pain / Cramping. 2. Start with Miralax sent to pharmacy. First dose 68g (4 capfuls) in 32oz water over 1 to 2 hours for clean out. Next day start 17g or 1 capful daily, may adjust dose up or down by half a capful every few days. Recommend to take this medicine daily for next 1-2 weeks, then may need to use it longer if needed. - Goal is to have soft regular bowel movement 1-3x daily, if too runny or diarrhea, then reduce dose of the medicine  Improve water intake, hydration will help Also recommend increased vegetables, fruits, fiber intake Can try daily Metamucil or Fiber supplement at pharmacy over the counter  Follow-up if symptoms are not improving with bowel movements, or if pain worsens, develop fevers, nausea, vomiting.  Please schedule a follow-up appointment with your PCP in 1 month to follow-up Constipation  If you have any other questions or concerns, please feel free to call the clinic to contact me. You may also schedule an earlier appointment if necessary.  However, if your symptoms get significantly worse, please go to the Emergency Department to seek immediate medical attention.

## 2015-08-21 ENCOUNTER — Other Ambulatory Visit: Payer: Self-pay | Admitting: Obstetrics and Gynecology

## 2015-08-28 ENCOUNTER — Ambulatory Visit: Payer: BLUE CROSS/BLUE SHIELD

## 2015-09-09 ENCOUNTER — Encounter: Payer: Self-pay | Admitting: Family Medicine

## 2015-09-09 ENCOUNTER — Ambulatory Visit (INDEPENDENT_AMBULATORY_CARE_PROVIDER_SITE_OTHER): Payer: BLUE CROSS/BLUE SHIELD | Admitting: Family Medicine

## 2015-09-09 VITALS — Ht 65.0 in | Wt 359.2 lb

## 2015-09-09 DIAGNOSIS — E119 Type 2 diabetes mellitus without complications: Secondary | ICD-10-CM

## 2015-09-09 DIAGNOSIS — Z6841 Body Mass Index (BMI) 40.0 and over, adult: Secondary | ICD-10-CM | POA: Diagnosis not present

## 2015-09-09 NOTE — Progress Notes (Signed)
Medical Nutrition Therapy:  Appt start time: 1000 end time:  1100.  Assessment:  Primary concerns today: Weight management and Blood sugar control.   Valerie Carter has been sick with the flu in the past month, so she didn't complete her Goals Sheet.  She has been eating 3 meals on most days, and is eating more F&V, although exercise has been limited b/c of being sick.  We reviewed yesterday's intake, and discussed how she could have improved on these choices.    Valerie Carter has restarted taking her DM med's, but has not checked BG yet.    24-hr recall:  (Up at 4:30 AM) B (5:30 AM)-  1 2-egg wrap, 1 T each sour cream & salsa, green tea Snk (11 AM)-  1 c strawberries, 6 pb crackers (~2 T pb), water L (2:30 PM)-  1/2 c pineapple, 4 oz Activia yogurt Snk ( PM)-  --- D (6 PM)-  1 chsbgr, bun, let, tom, cuc, on, 10 ff's, water Snk ( PM)-  --- Typical day? No.  Burger for dinner was unusual; more likely chx or fish.    Progress Towards Goal(s):  In progress.   Nutritional Diagnosis:  [place]-3.3 Overweight/obesity As related to energy imbalance.  As evidenced by BMI >60.    Intervention:  Nutrition education.  Handouts given during visit include:  AVS  Goals sheet  Demonstrated degree of understanding via:  Teach Back   Monitoring/Evaluation:  Dietary intake, exercise, BG, and body weight in 4 week(s).

## 2015-09-09 NOTE — Patient Instructions (Addendum)
-   One protein powder that is lactose-free:  Orgain.   - Listen to the Podcast (link emailed) about pain management.   - Reminder: Turmeric.  Goals: 1. Eat at least 3 REAL meals (protein, starch, veg's and/or fruit) and at least 1 snack per day.  Aim for no more than 5 hours between eating. Eat SOMEthing the first after getting up.  2. Obtain twice as many veg's as protein or carbohydrate foods for both lunch and dinner. 3. At least 30 minutes of physical activity 5 days a week (150 min/week).  Bring your Goals Sheet to follow-up appt.

## 2015-09-11 ENCOUNTER — Ambulatory Visit (INDEPENDENT_AMBULATORY_CARE_PROVIDER_SITE_OTHER): Payer: BLUE CROSS/BLUE SHIELD | Admitting: Family Medicine

## 2015-09-11 VITALS — BP 120/70 | HR 80 | Temp 98.7°F | Ht 65.0 in | Wt 357.0 lb

## 2015-09-11 DIAGNOSIS — N924 Excessive bleeding in the premenopausal period: Secondary | ICD-10-CM

## 2015-09-11 DIAGNOSIS — E119 Type 2 diabetes mellitus without complications: Secondary | ICD-10-CM | POA: Diagnosis not present

## 2015-09-11 DIAGNOSIS — D509 Iron deficiency anemia, unspecified: Secondary | ICD-10-CM

## 2015-09-11 LAB — CBC
HCT: 35.8 % — ABNORMAL LOW (ref 36.0–46.0)
Hemoglobin: 11.4 g/dL — ABNORMAL LOW (ref 12.0–15.0)
MCH: 24.5 pg — ABNORMAL LOW (ref 26.0–34.0)
MCHC: 31.8 g/dL (ref 30.0–36.0)
MCV: 77 fL — AB (ref 78.0–100.0)
MPV: 9.2 fL (ref 8.6–12.4)
PLATELETS: 479 10*3/uL — AB (ref 150–400)
RBC: 4.65 MIL/uL (ref 3.87–5.11)
RDW: 16.5 % — ABNORMAL HIGH (ref 11.5–15.5)
WBC: 8.5 10*3/uL (ref 4.0–10.5)

## 2015-09-11 LAB — POCT GLYCOSYLATED HEMOGLOBIN (HGB A1C): HEMOGLOBIN A1C: 8.2

## 2015-09-11 LAB — FERRITIN: FERRITIN: 56 ng/mL (ref 10–232)

## 2015-09-11 LAB — IRON AND TIBC
%SAT: 11 % (ref 11–50)
Iron: 31 ug/dL — ABNORMAL LOW (ref 45–160)
TIBC: 287 ug/dL (ref 250–450)
UIBC: 256 ug/dL (ref 125–400)

## 2015-09-11 LAB — TSH: TSH: 0.45 m[IU]/L

## 2015-09-12 DIAGNOSIS — N924 Excessive bleeding in the premenopausal period: Secondary | ICD-10-CM | POA: Insufficient documentation

## 2015-09-12 NOTE — Assessment & Plan Note (Signed)
Given her continued menstruation, I don't think the endometrial stripe of 15 mm is necessarily worrisome. After discussion with her today and which we spent greater than 50% of our 30 minute office visit in counseling and education, she decided that she would like to have evaluation for hysterectomy, endometrial ablation, or other surgical procedure rather than have complete workup for potential abnormal endometrial stripe. I think this is reasonable. Her ultrasound showed multiple fibroids. I will set her up to see gynecology.

## 2015-09-12 NOTE — Progress Notes (Signed)
Patient ID: Valerie Carter, female   DOB: 1960-11-22, 55 y.o.   MRN: [id number] Abnormal bleeding. Heavy bleeding. She has never stopped her menstrual cycle for more than 12 months. The longest has been for about 3-4 months. Over the last couple of years her cycles have been heavier. She's been having some intermenstrual bleeding occasionally as well. She recently had a pelvic ultrasound. She was scheduled today for an endometrial biopsy.  Ultrasound results:Measurements: 12.9 x 7.2 x 9.8 cm. Multiple uterine fibroids are identified. 4.0 x 4.4 x 4.7 cm fundal fibroid. 3.6 x 3.0 x 3.1 cm sub serosal anterior lower fundal fibroid. 4.9 x 4.8 x 4.5 cm posterior lower fundal fibroid small anterior lower fundal fibroid measures 2.4 x 2.1 x 3.0 cm.  Endometrium  Thickness: 15 mm. No focal abnormality visualized.  Right ovary  Not visualized  Left ovary  Not visualized  Other findings  No abnormal free fluid.  PERTINENT  PMH / PSH: I have reviewed the patient's medications, allergies, past medical and surgical history. Pertinent findings that relate to today's visit / issues include: Obesity, hypertension, hyperlipidemia. Nonsmoker. Not currently sexually active.   ASSESSMENT/PLAN: I will refer her to gynecology. Since she has not had lab work for her diabetes in a long time we'll go ahead and get that as well as some labs in preparation for potential surgical intervention.

## 2015-09-16 ENCOUNTER — Encounter: Payer: Self-pay | Admitting: Family Medicine

## 2015-09-18 ENCOUNTER — Telehealth: Payer: Self-pay | Admitting: Obstetrics and Gynecology

## 2015-09-18 NOTE — Telephone Encounter (Signed)
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  She will pick up the forms when ready

## 2015-09-19 NOTE — Telephone Encounter (Signed)
Form placed in provider's box for completion. Shyna Duignan,CMA  

## 2015-09-22 ENCOUNTER — Encounter: Payer: Self-pay | Admitting: Obstetrics and Gynecology

## 2015-09-22 NOTE — Telephone Encounter (Signed)
Letter/form completed and returned to [employer].

## 2015-09-23 NOTE — Telephone Encounter (Signed)
Patient informed that form is complete and ready for pick up.  Martin, Tamika L, RN  

## 2015-09-28 ENCOUNTER — Other Ambulatory Visit: Payer: Self-pay | Admitting: Family Medicine

## 2015-10-06 ENCOUNTER — Ambulatory Visit: Payer: BLUE CROSS/BLUE SHIELD | Admitting: Family Medicine

## 2015-10-07 ENCOUNTER — Other Ambulatory Visit: Payer: Self-pay | Admitting: Family Medicine

## 2015-10-08 ENCOUNTER — Encounter: Payer: BLUE CROSS/BLUE SHIELD | Admitting: Gynecology

## 2015-10-08 ENCOUNTER — Encounter: Payer: Self-pay | Admitting: Gynecology

## 2015-10-13 NOTE — Progress Notes (Signed)
Erroneous encounter

## 2015-11-04 ENCOUNTER — Encounter: Payer: Self-pay | Admitting: Family Medicine

## 2015-11-04 ENCOUNTER — Ambulatory Visit (INDEPENDENT_AMBULATORY_CARE_PROVIDER_SITE_OTHER): Payer: BLUE CROSS/BLUE SHIELD | Admitting: Family Medicine

## 2015-11-04 VITALS — BP 144/79 | HR 91 | Ht 65.0 in | Wt 357.0 lb

## 2015-11-04 DIAGNOSIS — M170 Bilateral primary osteoarthritis of knee: Secondary | ICD-10-CM

## 2015-11-04 MED ORDER — METHYLPREDNISOLONE ACETATE 40 MG/ML IJ SUSP
40.0000 mg | Freq: Once | INTRAMUSCULAR | Status: AC
  Administered 2015-11-04: 40 mg via INTRA_ARTICULAR

## 2015-11-10 NOTE — Progress Notes (Signed)
   Subjective:    Patient ID: Valerie Carter, female    DOB: Feb 16, 1961, 55 y.o.   MRN: [id number]  HPI Bilateral knee pain, left worse than right. Last corticosteroid injection she had helped her for about 2 months. In the last 6 weeks she's had increase in pain till its 8 out of 10 on the right 9-10 on the left most of the day. She is open to the idea of total knee replacement. She realizes she will likely have to lose a little bit more weight before that's optimal but she has already: Made an appointment with an orthopedist to discuss.   Review of Systems She's had some intentional weight loss.    Objective:   Physical Exam GEN.: Well-developed obese female no acute distress KNEES: There is no effusion on either knee. There is no redness. Tenderness to palpation bilateral medial joint line and on the left knee she's also tender on the lateral joint line. Crepitus on extension bilaterally. She has full range of motion in extension and flexion. Calf is soft. Optimal space is benign.  IMAGING: The last imaging they have of her nieces in 2012. We have ordered imaging for her previously which she has not yet gone to get that done. INJECTION: Patient was given informed consent, signed copy in the chart. Appropriate time out was taken. Area prepped and draped in usual sterile fashion. 1 cc of methylprednisolone 40 mg/ml plus  4 cc of 1% lidocaine without epinephrine was injected into the bilateral knees using a(n) anterior medial approach. The patient tolerated the procedure well. There were no complications. Post procedure instructions were given.     Assessment & Plan:

## 2015-11-11 ENCOUNTER — Ambulatory Visit (INDEPENDENT_AMBULATORY_CARE_PROVIDER_SITE_OTHER): Payer: BLUE CROSS/BLUE SHIELD | Admitting: Family Medicine

## 2015-11-11 ENCOUNTER — Encounter: Payer: Self-pay | Admitting: Family Medicine

## 2015-11-11 VITALS — Ht 65.0 in | Wt 361.6 lb

## 2015-11-11 DIAGNOSIS — E119 Type 2 diabetes mellitus without complications: Secondary | ICD-10-CM

## 2015-11-11 DIAGNOSIS — Z6841 Body Mass Index (BMI) 40.0 and over, adult: Secondary | ICD-10-CM

## 2015-11-11 NOTE — Patient Instructions (Addendum)
-   Try to take your iron on an empty stomach (1 hr pre-eating or 2 hr post-eating) along with at least 200 mg vitamin C.  If it's hard to manage taking on an empty stomach, take your iron and vit C in the night when you wake up.   - At your next dr's appt, ask to get your vitamin D checked again (last time was 2015, and that was a very low 15 ng/mL.  You want this number to be at least 30 ng/mL.   - Aspirin:  It is a good idea to continue with the 81 mg aspirin daily even when you are taking ibuprofen for pain.   - Sour cream:  Try low-fat instead of regular.  Or use fat-free plain [place] yogurt.  If you have to buy more than you think you'll use, you can freeze yogurt in individual portions.   - Beer:  Moderation is 12 oz 3-4 X wk.   - Breakfast ideas:    - Firefighter or Muffin tin omelettes; modify as needed to limit fat content.    - Yogurt with 1 tbsp unsalted nuts and some fruit.   - Sandwich wrap (cheese, pb, or meat)  - Call about water exercise at [facility].  If the schedule does not work at [facility], call [place] to ask about [employer] for the summer, and try the [employer].    Goals remain the same: 1. Eat at least 3 REAL meals (protein, starch, veg's and/or fruit) and at least 1 snack per day.  Aim for no more than 5 hours between eating. Eat SOMEthing the first after getting up.  2. Obtain twice as many veg's as protein or carbohydrate foods for both lunch and dinner. 3. At least 30 minutes of physical activity 5 days a week (150 min/week). Complete your Goals Sheet, and bring to follow-up.    - Reminder:  Oct 10 Weigh to Wellness II, which will be 6 Tuesdays from 5:30-6:30 at [facility].

## 2015-11-11 NOTE — Progress Notes (Signed)
Medical Nutrition Therapy:  Appt start time: 1030 end time:  1130.  Assessment:  Primary concerns today: Weight management and Blood sugar control.   Sawyer has been feeling depressed; has lost 3 friends in the last couple months.  This has also motivated her for better self-care, however.  She bought a notebook to record food intake, and is committing to better food choices.  She has also bought 30 meal prep bowls that can be frozen or microwaved, which she believes will help with both planning and portion control.    Antanae is usually waking around 1 AM with arm and hand pain/numbness, for which Dr. Nori Riis recommended some wrist wraps at night.  She is going to get them this week.  She is going for X-ray for her knees tomorrow, which are also painful at night sometimes, so sleep has been compromised recently.  Cortisone shots have been helpful for knee pain.  24-hr recall:  (Up at 4:30 AM) B (5:30 AM)-  1 c coffee, 2 T flavored creamer, 6 oz yogurt, 5 str'berries  Snk (11 AM)-  1/2 c peanuts, water L (1 PM)-  1 salad, 6 oz chx, vin&oil (dipped fork), sweet green tea Snk (2:30)-  10-12 tortilla chips, 1 T sour crm, 2 T salsa D (7 PM)-  1 salad, 6 oz chx, drsng (dipped fork), 12 oz beer Snk ( PM)-  --- Typical day? Yes.    Progress Towards Goal(s):  In progress.   Nutritional Diagnosis:  No progress on [place]-3.3 Overweight/obesity As related to energy imbalance.  As evidenced by BMI >60.    Intervention:  Nutrition education.  Handouts given during visit include:  AVS  Goals sheet   Demonstrated degree of understanding via:  Teach Back   Monitoring/Evaluation:  Dietary intake, exercise, BG, and body weight in 5 week(s).

## 2015-11-25 ENCOUNTER — Ambulatory Visit
Admission: RE | Admit: 2015-11-25 | Discharge: 2015-11-25 | Disposition: A | Discharge status: Home / Self Care | Payer: BLUE CROSS/BLUE SHIELD | Source: Ambulatory Visit | Attending: Family Medicine | Admitting: Family Medicine

## 2015-11-25 DIAGNOSIS — M170 Bilateral primary osteoarthritis of knee: Secondary | ICD-10-CM

## 2015-12-02 ENCOUNTER — Encounter: Payer: Self-pay | Admitting: Family Medicine

## 2015-12-02 ENCOUNTER — Telehealth: Payer: Self-pay | Admitting: *Deleted

## 2015-12-08 ENCOUNTER — Other Ambulatory Visit: Payer: Self-pay | Admitting: *Deleted

## 2015-12-08 ENCOUNTER — Encounter: Payer: Self-pay | Admitting: Family Medicine

## 2015-12-08 MED ORDER — TRAMADOL HCL 50 MG PO TABS: ORAL_TABLET | ORAL | Status: DC

## 2015-12-08 NOTE — Telephone Encounter (Signed)
[name] I am sending her a letter with complete report. 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 THANKS! [name]

## 2015-12-10 ENCOUNTER — Ambulatory Visit: Payer: BLUE CROSS/BLUE SHIELD | Admitting: Gynecology

## 2015-12-11 ENCOUNTER — Ambulatory Visit (INDEPENDENT_AMBULATORY_CARE_PROVIDER_SITE_OTHER): Payer: BLUE CROSS/BLUE SHIELD | Admitting: Family Medicine

## 2015-12-11 ENCOUNTER — Encounter: Payer: Self-pay | Admitting: Family Medicine

## 2015-12-11 VITALS — Ht 65.0 in | Wt 366.2 lb

## 2015-12-11 DIAGNOSIS — E119 Type 2 diabetes mellitus without complications: Secondary | ICD-10-CM | POA: Diagnosis not present

## 2015-12-11 DIAGNOSIS — Z6841 Body Mass Index (BMI) 40.0 and over, adult: Secondary | ICD-10-CM | POA: Diagnosis not present

## 2015-12-11 NOTE — Patient Instructions (Addendum)
Diet Recommendations for Diabetes  Starchy (carb) foods: Bread, rice, pasta, potatoes, corn, cereal, grits, crackers, bagels, muffins, all baked goods.  (Fruits, milk, and yogurt also have carbohydrate, but most of these foods will not spike your blood sugar as the starchy foods will.)  A few fruits do cause high blood sugars; use small portions of bananas (limit to 1/2 at a time), grapes, watermelon, oranges, and most tropical fruits.   Protein foods: Meat, fish, poultry, eggs, dairy foods, and beans such as pinto and kidney beans (beans also provide carbohydrate).  1. Eat at least 3 meals and 1-2 snacks per day. Never go more than 4-5 hours while awake without eating. Eat breakfast within the first hour of getting up.   2. Limit starchy foods to TWO per meal and ONE per snack. ONE portion of a starchy  food is equal to the following:   - ONE slice of bread (or its equivalent, such as half of a hamburger bun).   - 1/2 cup of a "scoopable" starchy food such as potatoes or rice.   - 15 grams of carbohydrate as shown on food label.  3. Include at every meal: a protein food, a carb food, and vegetables and/or fruit.   - Obtain twice the volume of veg's as protein or carbohydrate foods for both lunch and dinner.   - Fresh or frozen veg's are best.   - Keep frozen veg's on hand for a quick vegetable serving.      Today's Goals: 1. Limit starchy foods to TWO per meal and ONE per snack. 2. Obtain twice as many veg's as protein or carbohydrate foods for both lunch and dinner. 3. At least 30 minutes of physical activity 5 days a week (record minutes). Complete Goals Sheet, and bring to follow-up.  (AND: Consider cutting down on creamer.  Remember that consistency is key in this process of changing taste preferences.  It will require measuring each time!)  NOTE: CARB & KCAL OF CREAMERS CAN BE SIGNIFICANT, e.g., CoffeeMate Fr Vanilla = 148 kcal & >17 g carb/2 T.

## 2015-12-11 NOTE — Progress Notes (Signed)
Medical Nutrition Therapy:  Appt start time: 1330 end time:  1430.  Assessment:  Primary concerns today: Weight management and Blood sugar control.   Valerie Carter has consistently been taking Metformin, aspirin, and Fe supplement with vit C.  She started the [employer] ex class last night.  Starting this week, her plan is to do water exercise on MW, walk ~40 min on T&Th w/ her sister and friend Hilda Blades, and fitness room at [employer] a couple times a week, as well as 45-min chair yoga on Fridays.    Recent dietary changes have included planning meals and preparing foods in advance.  She has been getting more fruits and veg's, but admits veg intake could improve more (see recall).    FBG have been running 170-175.    24-hr recall suggests intake of ~1330 kcal:  (Up at 8 AM) B (8:30 AM)-  3 slc turk bacon, 2-egg omelette, 1/2 c watermelon, coffee, flvrd creamer 500 Snk ( PM)-  --- L (12 PM)-  2 c mixed fruit         240 Water aerobic at 4 PM Snk ( PM)-  --- D (7:30 PM)-  1 pork chop, 1 1/2 c grn beans (olive oil), 1 c mshd pot's, water  590  Snk ( PM)-  --- Typical day? Yes.    Progress Towards Goal(s):  In progress.   Nutritional Diagnosis:  Minimal progress on [place]-3.3 Overweight/obesity As related to energy imbalance.  As evidenced by weight gain of almost 5 lb, although food choices have improved, and has started ex routine just this week.    Intervention:  Nutrition education.  Handouts given during visit include:  AVS  Goals sheet, revised   Demonstrated degree of understanding via:  Teach Back   Monitoring/Evaluation:  Dietary intake, exercise, BG, and body weight in 4 week(s).

## 2015-12-29 ENCOUNTER — Encounter: Payer: Self-pay | Admitting: Obstetrics and Gynecology

## 2015-12-29 ENCOUNTER — Ambulatory Visit (INDEPENDENT_AMBULATORY_CARE_PROVIDER_SITE_OTHER): Payer: BLUE CROSS/BLUE SHIELD | Admitting: Obstetrics and Gynecology

## 2015-12-29 VITALS — BP 135/73 | HR 95 | Temp 98.2°F | Wt 370.0 lb

## 2015-12-29 DIAGNOSIS — E119 Type 2 diabetes mellitus without complications: Secondary | ICD-10-CM

## 2015-12-29 DIAGNOSIS — E559 Vitamin D deficiency, unspecified: Secondary | ICD-10-CM | POA: Diagnosis not present

## 2015-12-29 DIAGNOSIS — E669 Obesity, unspecified: Secondary | ICD-10-CM

## 2015-12-29 DIAGNOSIS — Z0000 Encounter for general adult medical examination without abnormal findings: Secondary | ICD-10-CM

## 2015-12-29 DIAGNOSIS — I10 Essential (primary) hypertension: Secondary | ICD-10-CM | POA: Diagnosis not present

## 2015-12-29 DIAGNOSIS — N924 Excessive bleeding in the premenopausal period: Secondary | ICD-10-CM | POA: Diagnosis not present

## 2015-12-29 DIAGNOSIS — E785 Hyperlipidemia, unspecified: Secondary | ICD-10-CM

## 2015-12-29 LAB — LIPID PANEL
Cholesterol: 174 mg/dL (ref 125–200)
HDL: 50 mg/dL (ref >=46)
LDL Cholesterol: 105 mg/dL (ref <130)
Total CHOL/HDL Ratio: 3.5 Ratio (ref <=5.0)
Triglycerides: 94 mg/dL (ref <150)
VLDL: 19 mg/dL (ref <30)

## 2015-12-29 LAB — POCT GLYCOSYLATED HEMOGLOBIN (HGB A1C): HEMOGLOBIN A1C: 8.4

## 2015-12-29 NOTE — Progress Notes (Signed)
     Subjective: Chief Complaint  Patient presents with  . Follow-up    DM      HPI: Valerie Carter is a 55 y.o. presenting to clinic today to discuss the following:  #Diabetes:  Last A1c 08/2015 - 8.2 Sugars - 295-130 at home Taking medications: forgets to take medicine  Side effects: denies hypoglycemia On Aspirin On statin Up to date on screenings: foot, eye, urine ROS: denies fever, chills, dizziness, LOC, polyuria, polydipsia, numbness or tingling in extremities or chest pain, visual disturbances.  #Perimenopausal Bleeding -has follow-up with gyn on 7th -continues to have bleeding  #Hypertension Blood pressure at home: does not check Taking Meds: yes daily Side effects: none ROS: Denies headache, dizziness, visual changes, nausea, vomiting, chest pain, abdominal pain or shortness of breath.  #HLD -taking statin -improving diet  #Obesity Water aerobics on m,w  Walks [place]th in park about 1.5 miles Changing things she is eating Meets with dr. Jenne Campus - has set goals Reduced meat intake increases vegetables and fruits No sweet tooth Meal preping Has thought about bariatric surgery Needs her Vit. D rechecked  Health Maintenance: hep c, HIV, colonoscoy, lipid anel all due    ROS noted in HPI.  Past Medical, Surgical, Social, and Family History Reviewed & Updated per EMR. Smoking status - never smoker   Objective: BP 135/73 mmHg  Pulse 95  Temp(Src) 98.2 F (36.8 C) (Oral)  Wt 370 lb (167.831 kg)  SpO2 99%  LMP 12/21/2015 Vitals and nursing notes reviewed  Physical Exam  Constitutional: She is well-developed, well-nourished, and in no distress.  Obese  HENT:  Mouth/Throat: Oropharynx is clear and moist.  Eyes: Conjunctivae and EOM are normal.  Cardiovascular: Normal rate, regular rhythm and intact distal pulses.   Pulmonary/Chest: Effort normal and breath sounds normal.  Abdominal: Soft. Bowel sounds are normal.     Results for orders placed or  performed in visit on 12/29/15 (from the past 72 hour(s))  HgB A1c     Status: Abnormal   Collection Time: 12/29/15  8:33 AM  Result Value Ref Range   Hemoglobin A1C 8.4     Assessment/Plan: Please see problem based Assessment and Plan   Orders Placed This Encounter  Procedures  . Hepatitis C antibody  . HIV antibody  . VITAMIN D 25 Hydroxy (Vit-D Deficiency, Fractures)  . Lipid panel  . HgB A1c      Luiz Blare, DO 12/29/2015, 8:39 AM PGY-3, [place]

## 2015-12-29 NOTE — Patient Instructions (Signed)
Follow-up in 3 months or sooner Continue weight loss goals A1c 8.4 Will contact you about labs  Bariatric Surgery Information Bariatric surgery, also called weight loss surgery, is a procedure that helps you lose weight. You may consider or your health care provider may suggest bariatric surgery if:  You are severely obese and have been unable to lose weight through diet and exercise.  You have health problems related to obesity, such as:  Type 2 diabetes.  Heart disease.  Lung disease. HOW DOES BARIATRIC SURGERY HELP ME LOSE WEIGHT?  Bariatric surgery helps you lose weight by decreasing how much food your body absorbs. This is done by closing off part of your stomach to make it smaller. This restricts the amount of food your stomach can hold. Bariatric surgery can also change your body's regular digestive process, so that food bypasses the parts of your body that absorb calories and nutrients.  If you decide to have bariatric surgery, it is important to continue to eat a healthy diet and exercise regularly after the surgery.  WHAT ARE THE DIFFERENT KINDS OF BARIATRIC SURGERY?  There are two kinds of bariatric surgeries:  Restrictive surgeries make your stomach smaller. They do not change your digestive process. The smaller the size of your new stomach, the less food you can eat. There are different types of restrictive surgeries.  Malabsorptive surgeries both make your stomach smaller and alter your digestive process so that your body processes less calories and nutrients. These are the most common kind of bariatric surgery. There are different types of malabsorptive surgeries. WHAT ARE THE DIFFERENT TYPES OF RESTRICTIVE SURGERY? Adjustable Gastric Banding In this procedure, an inflatable band is placed around your stomach near the upper end. This makes the passageway for food into the rest of your stomach much smaller. The band can be adjusted, making it tighter or looser, by filling  it with salt solution. Your surgeon can adjust the band based on how are you feeling and how much weight you are losing. The band can be removed in the future.  Vertical Banded Gastroplasty In this procedure, staples are used to separate your stomach into two parts, a small upper pouch and a bigger lower pouch. This decreases how much food you can eat. Sleeve Gastrectomy In this procedure, your stomach is made smaller. This is done by surgically removing a large part of your stomach. When your stomach is smaller, you feel full more quickly and reduce how much you eat. WHAT ARE THE DIFFERENT TYPES OF MALABSORPTIVE SURGERY? Roux-en-Y Gastric Bypass (RGB) This is the most common weight loss surgery. In this procedure, a small stomach pouch is created in the upper part of your stomach. Next, this small stomach pouch is attached directly to the middle part of your small intestine. The farther down your small intestine the new connection is made, the fewer calories and nutrients you will absorb.  Biliopancreatic Diversion with Duodenal Switch (BPD/DS)  This is a multi-step procedure. In this procedure, a large part of your stomach is removed, making your stomach smaller. Next, this smaller stomach is attached to the lower part of your small intestine. Like the RGB surgery, you absorb fewer calories and nutrients the farther down your small intestine the attachment is made.   WHAT ARE THE RISKS OF BARIATRIC SURGERY? As with any surgical procedure, each type of bariatric surgery has its own risks. These risks also depend on your age, your overall health, and any other medical conditions you may have.  When deciding on bariatric surgery, it is very important to:   Talk to your health care provider and choose the surgery that is best for you.  Ask your health care provider about specific risks for the surgery you choose. FOR MORE INFORMATION  American Society for Metabolic & Bariatric Surgery:  www.asmbs.org  Weight-control Information Network (WIN): win.AmenCredit.is   This information is not intended to replace advice given to you by your health care provider. Make sure you discuss any questions you have with your health care provider.   Document Released: 06/14/2005 Document Revised: 03/05/2015 Document Reviewed: 12/13/2012 Elsevier Interactive Patient Education 2016 [employer].   Exercising to [employer] Exercising can help you to lose weight. In order to lose weight through exercise, you need to do vigorous-intensity exercise. You can tell that you are exercising with vigorous intensity if you are breathing very hard and fast and cannot hold a conversation while exercising. Moderate-intensity exercise helps to maintain your current weight. You can tell that you are exercising at a moderate level if you have a higher heart rate and faster breathing, but you are still able to hold a conversation. HOW OFTEN SHOULD I EXERCISE? Choose an activity that you enjoy and set realistic goals. Your health care provider can help you to make an activity plan that works for you. Exercise regularly as directed by your health care provider. This may include:  Doing resistance training twice each week, such as:  Push-ups.  Sit-ups.  Lifting weights.  Using resistance bands.  Doing a given intensity of exercise for a given amount of time. Choose from these options:  150 minutes of moderate-intensity exercise every week.  75 minutes of vigorous-intensity exercise every week.  A mix of moderate-intensity and vigorous-intensity exercise every week. Children, pregnant women, people who are out of shape, people who are overweight, and older adults may need to consult a health care provider for individual recommendations. If you have any sort of medical condition, be sure to consult your health care provider before starting a new exercise program. WHAT ARE SOME ACTIVITIES THAT CAN HELP ME  TO LOSE WEIGHT?   Walking at a rate of at least 4.5 miles an hour.  Jogging or running at a rate of 5 miles per hour.  Biking at a rate of at least 10 miles per hour.  Lap swimming.  Roller-skating or in-line skating.  Cross-country skiing.  Vigorous competitive sports, such as football, basketball, and soccer.  Jumping rope.  Aerobic dancing. HOW CAN I BE MORE ACTIVE IN MY DAY-TO-DAY ACTIVITIES?  Use the stairs instead of the elevator.  Take a walk during your lunch break.  If you drive, park your car farther away from work or school.  If you take public transportation, get off one stop early and walk the rest of the way.  Make all of your phone calls while standing up and walking around.  Get up, stretch, and walk around every 30 minutes throughout the day. WHAT GUIDELINES SHOULD I FOLLOW WHILE EXERCISING?  Do not exercise so much that you hurt yourself, feel dizzy, or get very short of breath.  Consult your health care provider prior to starting a new exercise program.  Wear comfortable clothes and shoes with good support.  Drink plenty of water while you exercise to prevent dehydration or heat stroke. Body water is lost during exercise and must be replaced.  Work out until you breathe faster and your heart beats faster.   This information is  not intended to replace advice given to you by your health care provider. Make sure you discuss any questions you have with your health care provider.   Document Released: 07/17/2010 Document Revised: 07/05/2014 Document Reviewed: 11/15/2013 Elsevier Interactive Patient Education [employer].

## 2015-12-30 LAB — HEPATITIS C ANTIBODY: HCV AB: NEGATIVE

## 2015-12-30 LAB — VITAMIN D 25 HYDROXY (VIT D DEFICIENCY, FRACTURES): Vit D, 25-Hydroxy: 29 ng/mL — ABNORMAL LOW (ref 30–100)

## 2015-12-30 LAB — HIV ANTIBODY (ROUTINE TESTING W REFLEX): HIV: NONREACTIVE

## 2016-01-02 ENCOUNTER — Ambulatory Visit: Payer: BLUE CROSS/BLUE SHIELD | Admitting: Gynecology

## 2016-01-04 NOTE — Assessment & Plan Note (Signed)
Collect HIV and Hep C today.

## 2016-01-04 NOTE — Assessment & Plan Note (Signed)
Morbidly obese with BMI of 61. Tryintg to lose weight. Diet needs improvement. Exercises most days of the week. Encouraged continued lifestyle modifications. She follows with nutritionist Dr. Kathaleen Grinder. Interested in bariatric surgery but wants more information. Handout provided.

## 2016-01-04 NOTE — Assessment & Plan Note (Signed)
Follows up with gyn later this week. Will follow-up on plan with patient after that visit.

## 2016-01-04 NOTE — Assessment & Plan Note (Signed)
Long-standing condition. Not well controlled. Non-compliant. A1c today 8.4.  Will hold off on adjusting regimen at this time since patient is non-compliant and this could be contributing to elevated A1c. Counseled extensively on checking blood sugars and being compliant with therapy as to avoid long-term side effects of diabetes. Patient understands plan. Follow-up in 2 weeks with log of sugars.

## 2016-01-04 NOTE — Assessment & Plan Note (Signed)
On statin. Lipid panel collected today.

## 2016-01-04 NOTE — Assessment & Plan Note (Signed)
Recheck Vit. D level today. Continue supplementation.

## 2016-01-04 NOTE — Assessment & Plan Note (Signed)
Controlled chronic condition. At goal. Continue therapy.

## 2016-01-13 ENCOUNTER — Ambulatory Visit: Payer: BLUE CROSS/BLUE SHIELD | Admitting: Family Medicine

## 2016-01-23 ENCOUNTER — Other Ambulatory Visit: Payer: Self-pay | Admitting: Obstetrics and Gynecology

## 2016-01-27 ENCOUNTER — Ambulatory Visit (INDEPENDENT_AMBULATORY_CARE_PROVIDER_SITE_OTHER): Payer: BLUE CROSS/BLUE SHIELD | Admitting: Gynecology

## 2016-01-27 ENCOUNTER — Encounter: Payer: Self-pay | Admitting: Gynecology

## 2016-01-27 VITALS — BP 138/86 | Ht 65.0 in | Wt 364.0 lb

## 2016-01-27 DIAGNOSIS — N840 Polyp of corpus uteri: Secondary | ICD-10-CM | POA: Diagnosis not present

## 2016-01-27 DIAGNOSIS — E669 Obesity, unspecified: Secondary | ICD-10-CM | POA: Diagnosis not present

## 2016-01-27 DIAGNOSIS — D500 Iron deficiency anemia secondary to blood loss (chronic): Secondary | ICD-10-CM | POA: Diagnosis not present

## 2016-01-27 DIAGNOSIS — D251 Intramural leiomyoma of uterus: Secondary | ICD-10-CM | POA: Diagnosis not present

## 2016-01-27 DIAGNOSIS — N951 Menopausal and female climacteric states: Secondary | ICD-10-CM

## 2016-01-27 DIAGNOSIS — N920 Excessive and frequent menstruation with regular cycle: Secondary | ICD-10-CM

## 2016-01-27 MED ORDER — MEGESTROL ACETATE 40 MG PO TABS
40.0000 mg | ORAL_TABLET | Freq: Two times a day (BID) | ORAL | 1 refills | Status: DC
Start: 2016-01-27 — End: 2016-07-23

## 2016-01-27 NOTE — Progress Notes (Signed)
HPI: Patient is a 55 year old gravida 5 para 0 AB 5 who was referred to our practice by her PCP Dr. [name] and Dr. Johney Maine as a result of patient's menorrhagia. Patient denies any vasomotor symptoms. She has continued to have menstrual cycles to this day. She stated that her sister at 55 is still menstruating. Patient has had 5 pregnancies resulting in spontaneous miscarriages. Patient is morbidly obese with a weight of 364 pounds. Review of her record indicated in 2016 her hemoglobin was 10.2 and she's Taking iron supplementation 1 twice a day and her CBC in March of this year was 11.4 g. An ultrasound had been ordered Primary PCP in February this year and the following was the radiological interpretation:  FINDINGS: Uterus  Measurements: 12.9 x 7.2 x 9.8 cm. Multiple uterine fibroids are identified. 4.0 x 4.4 x 4.7 cm fundal fibroid. 3.6 x 3.0 x 3.1 cm sub serosal anterior lower fundal fibroid. 4.9 x 4.8 x 4.5 cm posterior lower fundal fibroid small anterior lower fundal fibroid measures 2.4 x 2.1 x 3.0 cm.  Endometrium  Thickness: 15 mm.  No focal abnormality visualized.  Right ovary  Not visualized  Left ovary  Not visualized   She is here today for consultation and plan a course of management. Patient is not bleeding today. She is being treated for type 2 diabetes and hypertension by her PCP.    ROS: A ROS was performed and pertinent positives and negatives are included in the history.  GENERAL: No fevers or chills. HEENT: No change in vision, no earache, sore throat or sinus congestion. NECK: No pain or stiffness. CARDIOVASCULAR: No chest pain or pressure. No palpitations. PULMONARY: No shortness of breath, cough or wheeze. GASTROINTESTINAL: No abdominal pain, nausea, vomiting or diarrhea, melena or bright red blood per rectum. GENITOURINARY: No urinary frequency, urgency, hesitancy or dysuria. MUSCULOSKELETAL: No joint or muscle pain, no back pain, no recent  trauma. DERMATOLOGIC: No rash, no itching, no lesions. ENDOCRINE: No polyuria, polydipsia, no heat or cold intolerance. No recent change in weight. HEMATOLOGICAL: Heavy menstrual cycles lasting up to 10 days NEUROLOGIC: No headache, seizures, numbness, tingling or weakness. PSYCHIATRIC: No depression, no loss of interest in normal activity or change in sleep pattern.   PE: Blood pressure 138/86   weight 364 pounds   BMI is 60.57 kg/m Gen. appearance morbidly obese patient in no acute distress Abdomen pendulous soft nontender no rebound or guarding Pelvic: Bartholin urethra Skene was within normal limits Vagina: No lesions or discharge no blood was noted in the vault Cervix: No lesions or discharge Bimanual exam limited to the to excessive abdominal girth attributed to patient's obesity Rectal exam: Not done  Patient was counseled for an endometrial biopsy. Of note patient is not sexually active. The cervix was cleansed with Betadine solution and an anterior cervical lip was grasped with a single-tooth tenaculum for traction and a sterile Pipelle was introduced into the uterine cavity. The uterus sounded to approximately 10/2 cm and moderate amount of tissue was obtained which was submitted for histological evaluation. The single-tooth tenaculum was then removed.   Assessment Plan: 55 year old morbidly obese patient with menorrhagia lasting up to 10 days no vasomotor symptoms. Patient has never stopped menstruating. We will check an [facility] today. Endometrial biopsy done today to rule out endometrial hyperplasia or malignancy. Patient will be prescribed Megace 40 mg to take 1 by mouth twice a day for 10 days in the event she begins to bleed before she  returns to the office for sonohysterogram which will be scheduled for sometime next week to rule out any intracavitary defect. If the pathology report comes back benign and the sonohysterogram demonstrated no intracavitary defects she will be a candidate for  Mirena IUD. Because of patient's large body mass index she would need to be operated on in a tertiary center the area robotic technique to do her total hysterectomy. Have expressed these concerns with the patient she fully understands and accepts. Literature information was provided on the Mirena IUD. She stated she's always had normal Pap smear her last was done less than a year ago.    Greater than 50% of time was spent in counseling and coordinating care of this patient.   Time of consultation: 30   Minutes.

## 2016-01-27 NOTE — Patient Instructions (Addendum)
ENDOMETRIAL BIOPSY POST-PROCEDURE INSTRUCTIONS  1. You may take Ibuprofen, Aleve or Tylenol for pain if needed.  Cramping should resolve within in 24 hours.  2. You may have a small amount of spotting.  You should wear a mini pad for the next few days.  3. You may have intercourse after 24 hours.  4. You need to call if you have any pelvic pain, fever, heavy bleeding or foul smelling vaginal discharge.  5. Shower or bathe as normal  6. We will call you within one week with results or we will discuss   the results at your follow-up appointment if needed.  Levonorgestrel intrauterine device (IUD) What is this medicine? LEVONORGESTREL IUD (LEE voe nor jes trel) is a contraceptive (birth control) device. The device is placed inside the uterus by a healthcare professional. It is used to prevent pregnancy and can also be used to treat heavy bleeding that occurs during your period. Depending on the device, it can be used for 3 to 5 years. This medicine may be used for other purposes; ask your health care provider or pharmacist if you have questions. What should I tell my health care provider before I take this medicine? They need to know if you have any of these conditions: -abnormal Pap smear -cancer of the breast, uterus, or cervix -diabetes -endometritis -genital or pelvic infection now or in the past -have more than one sexual partner or your partner has more than one partner -heart disease -history of an ectopic or tubal pregnancy -immune system problems -IUD in place -liver disease or tumor -problems with blood clots or take blood-thinners -use intravenous drugs -uterus of unusual shape -vaginal bleeding that has not been explained -an unusual or allergic reaction to levonorgestrel, other hormones, silicone, or polyethylene, medicines, foods, dyes, or preservatives -pregnant or trying to get pregnant -breast-feeding How should I use this medicine? This device is placed inside  the uterus by a health care professional. Talk to your pediatrician regarding the use of this medicine in children. Special care may be needed. Overdosage: If you think you have taken too much of this medicine contact a poison control center or emergency room at once. NOTE: This medicine is only for you. Do not share this medicine with others. What if I miss a dose? This does not apply. What may interact with this medicine? Do not take this medicine with any of the following medications: -amprenavir -bosentan -fosamprenavir This medicine may also interact with the following medications: -aprepitant -barbiturate medicines for inducing sleep or treating seizures -bexarotene -griseofulvin -medicines to treat seizures like carbamazepine, ethotoin, felbamate, oxcarbazepine, phenytoin, topiramate -modafinil -pioglitazone -rifabutin -rifampin -rifapentine -some medicines to treat HIV infection like atazanavir, indinavir, lopinavir, nelfinavir, tipranavir, ritonavir -St. John's wort -warfarin This list may not describe all possible interactions. Give your health care provider a list of all the medicines, herbs, non-prescription drugs, or dietary supplements you use. Also tell them if you smoke, drink alcohol, or use illegal drugs. Some items may interact with your medicine. What should I watch for while using this medicine? Visit your doctor or health care professional for regular check ups. See your doctor if you or your partner has sexual contact with others, becomes HIV positive, or gets a sexual transmitted disease. This product does not protect you against HIV infection (AIDS) or other sexually transmitted diseases. You can check the placement of the IUD yourself by reaching up to the top of your vagina with clean fingers to feel the threads. Do  not pull on the threads. It is a good habit to check placement after each menstrual period. Call your doctor right away if you feel more of the IUD  than just the threads or if you cannot feel the threads at all. The IUD may come out by itself. You may become pregnant if the device comes out. If you notice that the IUD has come out use a backup birth control method like condoms and call your health care provider. Using tampons will not change the position of the IUD and are okay to use during your period. What side effects may I notice from receiving this medicine? Side effects that you should report to your doctor or health care professional as soon as possible: -allergic reactions like skin rash, itching or hives, swelling of the face, lips, or tongue -fever, flu-like symptoms -genital sores -high blood pressure -no menstrual period for 6 weeks during use -pain, swelling, warmth in the leg -pelvic pain or tenderness -severe or sudden headache -signs of pregnancy -stomach cramping -sudden shortness of breath -trouble with balance, talking, or walking -unusual vaginal bleeding, discharge -yellowing of the eyes or skin Side effects that usually do not require medical attention (report to your doctor or health care professional if they continue or are bothersome): -acne -breast pain -change in sex drive or performance -changes in weight -cramping, dizziness, or faintness while the device is being inserted -headache -irregular menstrual bleeding within first 3 to 6 months of use -nausea This list may not describe all possible side effects. Call your doctor for medical advice about side effects. You may report side effects to FDA at 1-800-FDA-1088. Where should I keep my medicine? This does not apply. NOTE: This sheet is a summary. It may not cover all possible information. If you have questions about this medicine, talk to your doctor, pharmacist, or health care provider.    2016, Elsevier/Gold Standard. (2011-07-15 13:54:04)

## 2016-01-28 ENCOUNTER — Other Ambulatory Visit: Payer: Self-pay | Admitting: *Deleted

## 2016-01-28 DIAGNOSIS — N920 Excessive and frequent menstruation with regular cycle: Secondary | ICD-10-CM

## 2016-01-28 LAB — CBC WITH DIFFERENTIAL/PLATELET
BASOS PCT: 0 %
Basophils Absolute: 0 cells/uL (ref 0–200)
Eosinophils Absolute: 416 cells/uL (ref 15–500)
Eosinophils Relative: 4 %
HEMATOCRIT: 38.4 % (ref 35.0–45.0)
HEMOGLOBIN: 12.2 g/dL (ref 11.7–15.5)
LYMPHS ABS: 2600 {cells}/uL (ref 850–3900)
Lymphocytes Relative: 25 %
MCH: 25.1 pg — ABNORMAL LOW (ref 27.0–33.0)
MCHC: 31.8 g/dL — AB (ref 32.0–36.0)
MCV: 79 fL — ABNORMAL LOW (ref 80.0–100.0)
MONO ABS: 1040 {cells}/uL — AB (ref 200–950)
MPV: 10 fL (ref 7.5–12.5)
Monocytes Relative: 10 %
NEUTROS PCT: 61 %
Neutro Abs: 6344 cells/uL (ref 1500–7800)
Platelets: 295 10*3/uL (ref 140–400)
RBC: 4.86 MIL/uL (ref 3.80–5.10)
RDW: 15.3 % — ABNORMAL HIGH (ref 11.0–15.0)
WBC: 10.4 10*3/uL (ref 3.8–10.8)

## 2016-01-28 LAB — FOLLICLE STIMULATING HORMONE: FSH: 9.9 m[IU]/mL

## 2016-01-29 ENCOUNTER — Encounter: Payer: Self-pay | Admitting: Family Medicine

## 2016-01-29 ENCOUNTER — Ambulatory Visit (INDEPENDENT_AMBULATORY_CARE_PROVIDER_SITE_OTHER): Payer: BLUE CROSS/BLUE SHIELD | Admitting: Family Medicine

## 2016-01-29 DIAGNOSIS — E119 Type 2 diabetes mellitus without complications: Secondary | ICD-10-CM | POA: Diagnosis not present

## 2016-01-29 DIAGNOSIS — E669 Obesity, unspecified: Secondary | ICD-10-CM | POA: Diagnosis not present

## 2016-01-29 NOTE — Patient Instructions (Addendum)
-   Nuts and seeds:  On average, count on getting 800 calories per cup (or 200 calories per 1/4 cup).  - Keep track (make a list) of recipes that are especially good.  - Establish a system of keeping these recipes.    Goals remain the same: 1. Limit starchy foods to two per meal.  One portion equals:  - 1 slice of bread or its equivalent, e.g., 1 hamburger bun = 2.    - 1/2 cup of scoopable carb's.  - Every 15 grams of carb shown on the label = 1 carb portion.  2. Obtain twice as many veg's as protein or carbohydrate foods for both lunch and dinner. 3. Exercise at least 30 min 5 X wk.   Complete your Goals Sheet, and bring to follow-up.   High-glycemic fruits include bananas, watermelon, oranges, grapes, and tropical fruits (pineapple, mango, papaya).   Low- or moderate-glycemic fruits include ALL the berries, apples, peaches, plums, pears, cantaloupe, nectarines, kiwi.

## 2016-01-29 NOTE — Progress Notes (Signed)
Medical Nutrition Therapy:  Appt start time: 1330 end time:  1430.  Assessment:  Primary concerns today: Weight management and Blood sugar control.   Valerie Carter has been making some good food choices.  Still walking ~30 min with her sister and cousin on Mon, Tue, Thur, and Fri, and doing water [employer] aerobics 2 X wk.  Will be doing 3 X wk water aerobics and 2 X wk walking once school starts.  Her knees still limit ability to walk, but she is persevering.  Usually getting between 4000 & 5000  steps/day according to her FitBit.   Valerie Carter's sister started a FB page called, "I have diabetes, but diabetes don't have me."  It also focuses on other health problems, like HTN, and they have 58 members.    FBG have been around 180-190.  Although often consuming meals with limited (or no) starchy foods, [name] has not specifically been limiting carb's to two per meal.  Inconsistencies in carb intake may explain the relatively high BG levels.    24-hr recall:  (Up at 3:45 AM) B (5:30 AM)-  3-egg (only 2 yolks) chs omelette, 1 oz liver pudding, lemon water Back to bed 6:30-8 AM; walked 30 min.   Snk (12 PM)-  1/4 c cashews, water L (2 PM)-  1 fried chx brst, 2 slc bread, ketchip, water Snk (6 PM)-  24 oz beer D ( PM)-  --- Snk ( PM)-  --- Typical day? No.  Atypical in that no dinner meal; did not have a chance to prep foods in advance.  Has usually been getting veg's up to 3 X day, i.e., spinach in bkfst omelette.    Progress Towards Goal(s):  In progress.   Nutritional Diagnosis:  Slight progress noted on [name]-3.3 Overweight/obesity As related to energy imbalance.  As evidenced by mostly improved food choices and weight loss of 2 lb.    Intervention:  Nutrition education.  Handouts given during visit include:  AVS  Goals sheet  Demonstrated degree of understanding via:  Teach Back   Monitoring/Evaluation:  Dietary intake, exercise, BG, and body weight in 8 week(s).

## 2016-02-02 ENCOUNTER — Other Ambulatory Visit: Payer: Self-pay | Admitting: Gynecology

## 2016-02-02 DIAGNOSIS — N939 Abnormal uterine and vaginal bleeding, unspecified: Secondary | ICD-10-CM

## 2016-02-02 DIAGNOSIS — N920 Excessive and frequent menstruation with regular cycle: Secondary | ICD-10-CM

## 2016-02-03 ENCOUNTER — Telehealth: Payer: Self-pay | Admitting: Gynecology

## 2016-02-03 NOTE — Telephone Encounter (Signed)
02/03/16-Pt was advised today that her [facility] ins will cover the sonohysterogram and bx if needed at 100% since she has met her deductible and oop. Per Johnny'@BC' -Ref#[id number]

## 2016-02-03 NOTE — Telephone Encounter (Signed)
02/03/16-Pt was advised today that her [facility] will cover the Mirena for contraception at 100%. She has met both her oop and deductible for the year. Per Jenna_0 -Ref#[id number]. Pt at this time states she is not interested in scheduling.wl

## 2016-02-12 ENCOUNTER — Encounter: Payer: Self-pay | Admitting: Obstetrics and Gynecology

## 2016-02-12 ENCOUNTER — Ambulatory Visit (INDEPENDENT_AMBULATORY_CARE_PROVIDER_SITE_OTHER): Payer: BLUE CROSS/BLUE SHIELD | Admitting: Obstetrics and Gynecology

## 2016-02-12 VITALS — BP 100/83 | HR 72 | Temp 98.6°F | Wt 365.0 lb

## 2016-02-12 DIAGNOSIS — R059 Cough, unspecified: Secondary | ICD-10-CM

## 2016-02-12 DIAGNOSIS — R32 Unspecified urinary incontinence: Secondary | ICD-10-CM | POA: Diagnosis not present

## 2016-02-12 DIAGNOSIS — R05 Cough: Secondary | ICD-10-CM

## 2016-02-12 MED ORDER — BENZONATATE 200 MG PO CAPS: 200.0000 mg | ORAL_CAPSULE | Freq: Three times a day (TID) | ORAL | 0 refills | Status: DC | PRN

## 2016-02-12 NOTE — Patient Instructions (Signed)
Kegel Exercises The goal of Kegel exercises is to isolate and exercise your pelvic floor muscles. These muscles act as a hammock that supports the rectum, vagina, small intestine, and uterus. As the muscles weaken, the hammock sags and these organs are displaced from their normal positions. Kegel exercises can strengthen your pelvic floor muscles and help you to improve bladder and bowel control, improve sexual response, and help reduce many problems and some discomfort during pregnancy. Kegel exercises can be done anywhere and at any time. HOW TO PERFORM KEGEL EXERCISES 1. Locate your pelvic floor muscles. To do this, squeeze (contract) the muscles that you use when you try to stop the flow of urine. You will feel a tightness in the vaginal area (women) and a tight lift in the rectal area (men and women). 2. When you begin, contract your pelvic muscles tight for 2-5 seconds, then relax them for 2-5 seconds. This is one set. Do 4-5 sets with a short pause in between. 3. Contract your pelvic muscles for 8-10 seconds, then relax them for 8-10 seconds. Do 4-5 sets. If you cannot contract your pelvic muscles for 8-10 seconds, try 5-7 seconds and work your way up to 8-10 seconds. Your goal is 4-5 sets of 10 contractions each day. Keep your stomach, buttocks, and legs relaxed during the exercises. Perform sets of both short and long contractions. Vary your positions. Perform these contractions 3-4 times per day. Perform sets while you are:   Lying in bed in the morning.  Standing at lunch.  Sitting in the late afternoon.  Lying in bed at night. You should do 40-50 contractions per day. Do not perform more Kegel exercises per day than recommended. Overexercising can cause muscle fatigue. Continue these exercises for for at least 15-20 weeks or as directed by your caregiver.   This information is not intended to replace advice given to you by your health care provider. Make sure you discuss any questions  you have with your health care provider.   Document Released: 05/31/2012 Document Revised: 07/05/2014 Document Reviewed: 05/31/2012 Elsevier Interactive Patient Education 2016 Else[employer] Inc[employer]   Acute Bronchitis Bronchitis is when the airways that extend from the windpipe into the lungs get red, puffy, and painful (inflamed). Bronchitis often causes thick spit (mucus) to develop. This leads to a cough. A cough is the most common symptom of bronchitis. In acute bronchitis, the condition usually begins suddenly and goes away over time (usually in 2 weeks). Smoking, allergies, and asthma can make bronchitis worse. Repeated episodes of bronchitis may cause more lung problems. HOME CARE  Rest.  Drink enough fluids to keep your pee (urine) clear or pale yellow (unless you need to limit fluids as told by your doctor).  Only take over-the-counter or prescription medicines as told by your doctor.  Avoid smoking and secondhand smoke. These can make bronchitis worse. If you are a smoker, think about using nicotine gum or skin patches. Quitting smoking will help your lungs heal faster.  Reduce the chance of getting bronchitis again by:  Washing your hands often.  Avoiding people with cold symptoms.  Trying not to touch your hands to your mouth, nose, or eyes.  Follow up with your doctor as told. GET HELP IF: Your symptoms do not improve after 1 week of treatment. Symptoms include:  Cough.  Fever.  Coughing up thick spit.  Body aches.  Chest congestion.  Chills.  Shortness of breath.  Sore throat. GET HELP RIGHT AWAY IF:   You  have an increased fever.  You have chills.  You have severe shortness of breath.  You have bloody thick spit (sputum).  You throw up (vomit) often.  You lose too much body fluid (dehydration).  You have a severe headache.  You faint. MAKE SURE YOU:   Understand these instructions.  Will watch your condition.  Will get help right away if you  are not doing well or get worse.   This information is not intended to replace advice given to you by your health care provider. Make sure you discuss any questions you have with your health care provider.   Document Released: 12/01/2007 Document Revised: 02/14/2013 Document Reviewed: 12/05/2012 Elsevier Interactive Patient Education 2016 Else[employer]  Urinary Incontinence Urinary incontinence is the involuntary loss of urine from your bladder. CAUSES  There are many causes of urinary incontinence. They include:  Medicines.  Infections.  Prostatic enlargement, leading to overflow of urine from your bladder.  Surgery.  Neurological diseases.  Emotional factors. SIGNS AND SYMPTOMS Urinary Incontinence can be divided into four types: 1. Urge incontinence. Urge incontinence is the involuntary loss of urine before you have the opportunity to go to the bathroom. There is a sudden urge to void but not enough time to reach a bathroom. 2. Stress incontinence. Stress incontinence is the sudden loss of urine with any activity that forces urine to pass. It is commonly caused by anatomical changes to the pelvis and sphincter areas of your body. 3. Overflow incontinence. Overflow incontinence is the loss of urine from an obstructed opening to your bladder. This results in a backup of urine and a resultant buildup of pressure within the bladder. When the pressure within the bladder exceeds the closing pressure of the sphincter, the urine overflows, which causes incontinence, similar to water overflowing a dam. 4. Total incontinence. Total incontinence is the loss of urine as a result of the inability to store urine within your bladder. DIAGNOSIS  Evaluating the cause of incontinence may require:  A thorough and complete medical and obstetric history.  A complete physical exam.  Laboratory tests such as a urine culture and sensitivities. When additional tests are indicated, they can  include:  An ultrasound exam.  Kidney and bladder X-rays.  Cystoscopy. This is an exam of the bladder using a narrow scope.  Urodynamic testing to test the nerve function to the bladder and sphincter areas. TREATMENT  Treatment for urinary incontinence depends on the cause:  For urge incontinence caused by a bacterial infection, antibiotics will be prescribed. If the urge incontinence is related to medicines you take, your health care provider may have you change the medicine.  For stress incontinence, surgery to re-establish anatomical support to the bladder or sphincter, or both, will often correct the condition.  For overflow incontinence caused by an enlarged prostate, an operation to open the channel through the enlarged prostate will allow the flow of urine out of the bladder. In women with fibroids, a hysterectomy may be recommended.  For total incontinence, surgery on your urinary sphincter may help. An artificial urinary sphincter (an inflatable cuff placed around the urethra) may be required. In women who have developed a hole-like passage between their bladder and vagina (vesicovaginal fistula), surgery to close the fistula often is required. HOME CARE INSTRUCTIONS  Normal daily hygiene and the use of pads or adult diapers that are changed regularly will help prevent odors and skin damage.  Avoid caffeine. It can overstimulate your bladder.  Use the bathroom regularly.  Try about every 2-3 hours to go to the bathroom, even if you do not feel the need to do so. Take time to empty your bladder completely. After urinating, wait a minute. Then try to urinate again.  For causes involving nerve dysfunction, keep a log of the medicines you take and a journal of the times you go to the bathroom. SEEK MEDICAL CARE IF:  You experience worsening of pain instead of improvement in pain after your procedure.  Your incontinence becomes worse instead of better. SEE IMMEDIATE MEDICAL CARE  IF:  You experience fever or shaking chills.  You are unable to pass your urine.  You have redness spreading into your groin or down into your thighs. MAKE SURE YOU:   Understand these instructions.   Will watch your condition.  Will get help right away if you are not doing well or get worse.   This information is not intended to replace advice given to you by your health care provider. Make sure you discuss any questions you have with your health care provider.   Document Released: 07/22/2004 Document Revised: 07/05/2014 Document Reviewed: 11/21/2012 Elsevier Interactive Patient Education [employer].

## 2016-02-12 NOTE — Progress Notes (Signed)
   Subjective:   Patient ID: Valerie Carter, female    DOB: 07-10-1960, 55 y.o.   MRN: [id number]  Patient presents for Same Day Appointment  Chief Complaint  Patient presents with  . Cough    HPI: #COUGH Has been coughing for about a month Started off as a "chest cold" that has now improved but patient has persistent cough Sputum production: yes, originally was green in color but now clearer but still thick Medications tried: OTC meds like Nyquil  Overall improving No new meds The main concern is that every time she coughs she has incontinence of urine. This has not happened before  Symptoms Runny nose: no Mucous in back of throat: yes Throat burning or reflux: no Wheezing or asthma: no Fever: no Chest Pain: no Shortness of breath: no Hemoptysis: no  Review of Systems   See HPI for ROS.   Smoking status - Never smoker  Past medical history, surgical, family, and social history reviewed and updated in the EMR as appropriate.  Objective:  BP 100/83   Pulse 72   Temp 98.6 F (37 C) (Oral)   Wt (!) 365 lb (165.6 kg)   SpO2 100%   BMI 60.74 kg/m  Vitals and nursing note reviewed  Physical Exam  Constitutional: She is well-developed, well-nourished, and in no distress.  Obese  HENT:  Nose: Nose normal.  Mouth/Throat: Oropharynx is clear and moist and mucous membranes are normal.  Eyes: Conjunctivae are normal.  Neck: Normal range of motion. Neck supple.  Cardiovascular: Normal rate and regular rhythm.   Pulmonary/Chest: Effort normal and breath sounds normal. She has no wheezes. She has no rales.    Assessment & Plan:  1. Cough Normal exam. Patient afebrile and well-appearing. She most likely had an acute viral bronchitis that she is now getting over. Has no smoking history. Discussed with patient that cough can usually persist after resolution of infection. Since she has improved no need for Abx at this time. Rx for tessalon given for cough.  Return  precautions discussed. Handout given  2. Incontinence Appears to be stress incontinence from coughing. No prior history of incontinence. I believe if we treat the cough the incontinence will improved. Discussed conservative measures at this time. Encouraged kegel exercises and frequent toileting to keep bladder empty. If does not improve after improvement in cough or is causing patient too much distress as far as quality of life will treat.    Meds ordered this encounter  Medications  . benzonatate (TESSALON) 200 MG capsule    Sig: Take 1 capsule (200 mg total) by mouth 3 (three) times daily as needed for cough.    Dispense:  45 capsule    Refill:  0     Luiz Blare, DO 02/12/2016, 7:15 AM PGY-3, [place]

## 2016-02-13 ENCOUNTER — Ambulatory Visit: Payer: BLUE CROSS/BLUE SHIELD | Admitting: Gynecology

## 2016-02-13 ENCOUNTER — Other Ambulatory Visit: Payer: BLUE CROSS/BLUE SHIELD

## 2016-02-26 ENCOUNTER — Encounter: Payer: Self-pay | Admitting: Family Medicine

## 2016-02-26 ENCOUNTER — Ambulatory Visit (INDEPENDENT_AMBULATORY_CARE_PROVIDER_SITE_OTHER): Payer: BLUE CROSS/BLUE SHIELD | Admitting: Family Medicine

## 2016-02-26 VITALS — BP 112/71 | HR 93 | Temp 98.6°F | Ht 65.0 in | Wt 367.2 lb

## 2016-02-26 DIAGNOSIS — J189 Pneumonia, unspecified organism: Secondary | ICD-10-CM | POA: Diagnosis not present

## 2016-02-26 MED ORDER — ALBUTEROL SULFATE HFA 108 (90 BASE) MCG/ACT IN AERS: 2.0000 | INHALATION_SPRAY | Freq: Four times a day (QID) | RESPIRATORY_TRACT | 0 refills | Status: DC | PRN

## 2016-02-26 MED ORDER — AMOXICILLIN-POT CLAVULANATE 875-125 MG PO TABS: 1.0000 | ORAL_TABLET | Freq: Two times a day (BID) | ORAL | 0 refills | Status: DC

## 2016-02-26 NOTE — Progress Notes (Signed)
   Subjective: CC: cough [id number] Valerie Carter is a 55 y.o. female presenting to clinic today for same day appointment. PCP: Luiz Blare, DO Concerns today include:  1. Cough Patient returns for reevaluation for persistent cough.  She was seen on 8/17 by her PCP.  She notes that cough is productive and phlegm was clear but is not green again.  She has used Nyquil, Dayquil, Tessalon, benadryl; none have working.  Denies fevers, nausea, vomiting.  Endorses chills.  She reports no sick contacts.  She drives a school bus.  No acid reflux, bloating, diarrhea.  Does take an ACE-I.  No increased LE edema, orthopnea.  Patient is a nonsmoker.  No history of COPD or asthma.  Social History Reviewed: non smoker. FamHx and MedHx reviewed.  Please see EMR. Health Maintenance: flu shot due  ROS: Per HPI  Objective: Office vital signs reviewed. BP 112/71   Pulse 93   Temp 98.6 F (37 C) (Oral)   Ht 5\' 5"  (1.651 m)   Wt (!) 367 lb 3.2 oz (166.6 kg)   LMP 12/24/2015 (Approximate)   SpO2 100%   BMI 61.11 kg/m   Physical Examination:  General: Awake, alert, obese, No acute distress, tired appearing HEENT: Normal    Eyes: PERRLA, EOMI    Nose: nasal turbinates moist, mild mucosal edema    Throat: moist mucus membranes, no erythema Cardio: regular rate and rhythm, S1S2 heard, no murmurs appreciated Pulm: mild expiratory wheeze in the apex of the lungs, no rhonchi or rales, normal WOB on room air, intermittently coughing during appointment Extremities: warm, well perfused, trace pedal edema, cyanosis or clubbing; +2 pulses bilaterally  Assessment/ Plan: 55 y.o. female   1. CAP (community acquired pneumonia).  Given persistent cough, will empirically treat with abx.  Doubt acid reflux induced cough.  Is on ACE-I, cough be secondary to that.  Could also consider post viral cough.  Patient is a nonsmoker.   - Continue tessalon perles prn cough - Honey and tea recommended - Continue to hydrate  well - amoxicillin-clavulanate (AUGMENTIN) 875-125 MG tablet; Take 1 tablet by mouth 2 (two) times daily.  Dispense: 20 tablet; Refill: 0 - Albuterol inhaler provided for expiratory wheeze (which I suspect is a reactive process secondary to illness) - Strict return precautions reviewed - Could consider trial off ACE-I if no improvement with abx. - Consider CXR if no improvement on aforementioned therapies - Follow up with PCP as directed  Janora Norlander, DO PGY-3, [facility]

## 2016-02-26 NOTE — Patient Instructions (Addendum)
I am treating you as a pneumonia given the duration of your cough.  If your cough persists after completion of antibiotics, I want you to follow up with your PCP.  They may consider taking you off your benazepril for a short period of time to see if this improves your cough.  If your cough worsens, you develop shortness of breath, chest pain, high fever, please seek immediate medical attention.   Community-Acquired Pneumonia, Adult Pneumonia is an infection of the lungs. One type of pneumonia can happen while a person is in a hospital. A different type can happen when a person is not in a hospital (community-acquired pneumonia). It is easy for this kind to spread from person to person. It can spread to you if you breathe near an infected person who coughs or sneezes. Some symptoms include:  A dry cough.  A wet (productive) cough.  Fever.  Sweating.  Chest pain. HOME CARE  Take over-the-counter and prescription medicines only as told by your doctor.  Only take cough medicine if you are losing sleep.  If you were prescribed an antibiotic medicine, take it as told by your doctor. Do not stop taking the antibiotic even if you start to feel better.  Sleep with your head and neck raised (elevated). You can do this by putting a few pillows under your head, or you can sleep in a recliner.  Do not use tobacco products. These include cigarettes, chewing tobacco, and e-cigarettes. If you need help quitting, ask your doctor.  Drink enough water to keep your pee (urine) clear or pale yellow. A shot (vaccine) can help prevent pneumonia. Shots are often suggested for:  People older than 55 years of age.  People older than 55 years of age:  Who are having cancer treatment.  Who have long-term (chronic) lung disease.  Who have problems with their body's defense system (immune system). You may also prevent pneumonia if you take these actions:  Get the flu (influenza) shot every year.  Go to  the dentist as often as told.  Wash your hands often. If soap and water are not available, use hand sanitizer. GET HELP IF:  You have a fever.  You lose sleep because your cough medicine does not help. GET HELP RIGHT AWAY IF:  You are short of breath and it gets worse.  You have more chest pain.  Your sickness gets worse. This is very serious if:  You are an older adult.  Your body's defense system is weak.  You cough up blood.   This information is not intended to replace advice given to you by your health care provider. Make sure you discuss any questions you have with your health care provider.   Document Released: 12/01/2007 Document Revised: 03/05/2015 Document Reviewed: 10/09/2014 Elsevier Interactive Patient Education [employer].

## 2016-03-02 ENCOUNTER — Other Ambulatory Visit: Payer: Self-pay | Admitting: Obstetrics and Gynecology

## 2016-03-02 ENCOUNTER — Other Ambulatory Visit: Payer: Self-pay | Admitting: Family Medicine

## 2016-03-05 ENCOUNTER — Ambulatory Visit (INDEPENDENT_AMBULATORY_CARE_PROVIDER_SITE_OTHER): Payer: BLUE CROSS/BLUE SHIELD | Admitting: Family Medicine

## 2016-03-05 ENCOUNTER — Encounter: Payer: Self-pay | Admitting: Family Medicine

## 2016-03-05 VITALS — BP 164/93 | Ht 65.0 in | Wt 364.0 lb

## 2016-03-05 DIAGNOSIS — M170 Bilateral primary osteoarthritis of knee: Secondary | ICD-10-CM

## 2016-03-05 DIAGNOSIS — E669 Obesity, unspecified: Secondary | ICD-10-CM | POA: Diagnosis not present

## 2016-03-05 MED ORDER — METHYLPREDNISOLONE ACETATE 40 MG/ML IJ SUSP
40.0000 mg | Freq: Once | INTRAMUSCULAR | Status: AC
  Administered 2016-03-05: 40 mg via INTRA_ARTICULAR

## 2016-03-05 NOTE — Assessment & Plan Note (Signed)
Bilateral knee injections performed using an anterior medial approach. Patient tolerated this well. Did discuss that steroid injections can decrease in efficacy over time. With her degree of arthritis in the knees, she would only have really long-term benefits from a knee replacement. She needs to lose 124 pounds before her BMI is 40 or less. Her goal weight is 240 pounds. We'll recommend following back up with an orthopedic surgeon when she has obtained close to this goal. Have referred her to bariatric surgery for evaluation.

## 2016-03-05 NOTE — Assessment & Plan Note (Signed)
>>  ASSESSMENT AND PLAN FOR ARTHRITIS OF KNEE, DEGENERATIVE WRITTEN ON 03/05/2016 12:50 PM BY CHITANAND, ALICIA B, DO  Bilateral knee injections performed using an anterior medial approach. Patient tolerated this well. Did discuss that steroid injections can decrease in efficacy over time. With her degree of arthritis in the knees, she would only have really long-term benefits from a knee replacement. She needs to lose 124 pounds before her BMI is 40 or less. Her goal weight is 240 pounds. We'll recommend following back up with an orthopedic surgeon when she has obtained close to this goal. Have referred her to bariatric surgery for evaluation.

## 2016-03-05 NOTE — Progress Notes (Signed)
Valerie Carter - 55 y.o. female MRN [id number]  Date of birth: [birth year]  SUBJECTIVE:  Including CC & ROS.  CC: Bilateral knee pain Presents for bilateral knee pain right greater than left. She has been seen for this previously about 4 months ago and had it injected. She states that this lasted for about 3 weeks. 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 She notices occasional swelling. She has trouble with fully extending the knee. She has been to an orthopedic surgeon who stated that she needed to lose weight before getting a joint replacement. She does have a history of severe arthritis in her bilateral knees. She is trying to lose weight through exercise and diet. She is trying to do water aerobics for exercise. She is frustrated because she keeps losing and gaining weight. She has lost 3 pounds since her last visit.     ROS: No unexpected weight loss, fever, chills, swelling, instability, muscle pain, numbness/tingling, redness, otherwise see HPI   PMHx - Updated and reviewed.  Contributory factors include: Diabetes, obesity, hypertension, sleep apnea PSHx - Updated and reviewed.  Contributory factors include:  Negative FHx - Updated and reviewed.  Contributory factors include:  Negative Social Hx - Updated and reviewed. Contributory factors include: Negative Medications - reviewed   DATA REVIEWED: Bilateral standing knee x-rays one view show severe arthritis in the lateral compartment of her bilateral knees left greater than right. She has moderate arthritis in her medial compartment.  PHYSICAL EXAM:  VS: BP:(!) 164/93  HR: bpm  TEMP: ( )  RESP:   HT:5\' 5"  (165.1 cm)   WT:(!) 364 lb (165.1 kg)  BMI:60.7 PHYSICAL EXAM: Gen: NAD, alert, cooperative with exam, well-appearing HEENT: clear conjunctiva,  CV:  no edema, capillary refill brisk, normal rate Resp: non-labored Skin: no  rashes, normal turgor  Neuro: no gross deficits.  Psych:  alert and oriented Knee: Normal to inspection with no erythema or effusion or obvious bony abnormalities. Palpation with no warmth, but did have diffuse tenderness around her knee.  ROM decreased in flexion and extension- bilateral knees from 5 to 95 Ligaments with solid consistent endpoints including ACL, PCL, LCL, MCL. Non painful patellar compression. Patellar glide with crepitus. Patellar and quadriceps tendons unremarkable.   ASSESSMENT & PLAN:   DJD (degenerative joint disease) of knee Bilateral knee injections performed using an anterior medial approach. Patient tolerated this well. Did discuss that steroid injections can decrease in efficacy over time. With her degree of arthritis in the knees, she would only have really long-term benefits from a knee replacement. She needs to lose 124 pounds before her BMI is 40 or less. Her goal weight is 240 pounds. We'll recommend following back up with an orthopedic surgeon when she has obtained close to this goal. Have referred her to bariatric surgery for evaluation.  Obesity BMI is 60.7. She will likely need a BMI of 40 or less in order to be a candidate for knee replacement. This is a weight loss goal of losing 124 pounds for the weight goal of 240 pounds. Gave her information for bariatric surgeon. Continue following up with nutritionist. Continue water aerobics.  Consent obtained and verified. Sterile betadine prep. Furthur cleansed with alcohol. Topical analgesic spray: Ethyl chloride. Joint: left knee Approached in typical fashion with: anteromedial Completed without difficulty Meds: 1cc depomedrol, 3 cc 1% lidocaine Needle: 22 gauge  Aftercare instructions and Red flags advised.  Consent obtained and verified. Sterile betadine prep. Furthur cleansed with alcohol. Topical analgesic spray: Ethyl chloride. Joint: right knee Approached in typical fashion with:  anteromedial Completed without difficulty Meds: 1cc depomedrol, 3 cc 1% lidocaine Needle: 22 gauge Aftercare instructions and Red flags advised.

## 2016-03-05 NOTE — Assessment & Plan Note (Signed)
BMI is 60.7. She will likely need a BMI of 40 or less in order to be a candidate for knee replacement. This is a weight loss goal of losing 124 pounds for the weight goal of 240 pounds. Gave her information for bariatric surgeon. Continue following up with nutritionist. Continue water aerobics.

## 2016-03-05 NOTE — Progress Notes (Signed)
[facility]: Attending Note: I have reviewed the chart, discussed wit the Sports Medicine Fellow. I agree with assessment and treatment plan as detailed in the [place] note. She has severe end-stage lateral compartment arthritis bilateral knees. She got about 4 weeks of improvement after the last injection. She's really between a rock hard place given her obesity which is going to make surgery almost impossible at this point. We discussed. I gave her some information on bariatric surgery. She had previously discussed this with her PCP, Dr. Gerarda Fraction. To get down to a BMI of 40 or less she would have to lose 100+ pounds. Her knees are significantly degenerated and she needs bilateral TKR. Today we gave her bilateral knee injections to see if we can get her some pain relief and we'll see her back when necessary.  Notably her blood pressure was up today and I have asked her to follow-up with her PCP regarding that.

## 2016-03-10 ENCOUNTER — Ambulatory Visit: Payer: BLUE CROSS/BLUE SHIELD | Admitting: Gynecology

## 2016-03-10 ENCOUNTER — Other Ambulatory Visit: Payer: BLUE CROSS/BLUE SHIELD

## 2016-03-10 DIAGNOSIS — Z0289 Encounter for other administrative examinations: Secondary | ICD-10-CM

## 2016-04-06 ENCOUNTER — Ambulatory Visit (INDEPENDENT_AMBULATORY_CARE_PROVIDER_SITE_OTHER): Payer: BLUE CROSS/BLUE SHIELD | Admitting: Family Medicine

## 2016-04-06 ENCOUNTER — Encounter: Payer: Self-pay | Admitting: Family Medicine

## 2016-04-06 DIAGNOSIS — E119 Type 2 diabetes mellitus without complications: Secondary | ICD-10-CM | POA: Diagnosis not present

## 2016-04-06 DIAGNOSIS — IMO0001 Reserved for inherently not codable concepts without codable children: Secondary | ICD-10-CM

## 2016-04-06 DIAGNOSIS — E669 Obesity, unspecified: Secondary | ICD-10-CM

## 2016-04-06 DIAGNOSIS — Z6841 Body Mass Index (BMI) 40.0 and over, adult: Secondary | ICD-10-CM

## 2016-04-06 NOTE — Progress Notes (Signed)
Medical Nutrition Therapy:  Appt start time: 1100 end time:  1200.  Assessment:  Primary concerns today: Weight management and Blood sugar control.   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  Walking has become increasingly difficult b/c of knee pain, although she is still walking at least 30 (broken) min 3 X wk.  Monti is still active with the FB page about DM and wellness, "I have diabetes, but diabetes don't have me."  There are 115 followers on the page, although only 6-7 consistently post.  She also joined Weight [employer] two weeks ago.  She is tracking daily food intake with a [employer] app.  We again discussed the possibility of bariatric surgery.  Lillieanne is afraid of getting this surgery, and said she is determined to lose weight by diet and exercise.  I was frank with her in saying that this will continue to be a very tough process, although I am happy to support her efforts if that is her choice.    FBG have been 112-120, with some outliers of 89-90.    24-hr recall:  (Up at 4:30 AM) B (5:30 AM)-  1 slc toast, 1 tsp butter, 1 tsp jelly, water Snk ( AM)-   L (1 PM)-  2 Laughing Cow chs wedges, 10 Ritz crackers, 8 oz swt green tea (50 kcal) Snk (2:30)-  Yoplait yogurt (150 kcal), water D ( PM)-  2 c veg's, 6 oz breaded fish filet, 4 oz tart cherry juice Snk ( PM)-   Typical day? Yes.     Progress Towards Goal(s):  In progress.   Nutritional Diagnosis:  Slight progress noted on [place]-3.3 Overweight/obesity As related to energy imbalance.  As evidenced by weight loss of 6 lb in 2 months.    Intervention:  Nutrition education.  Handouts given during visit include:  AVS  Demonstrated degree of understanding via:  Teach Back   Monitoring/Evaluation:  Dietary intake, exercise, BG, and body weight in 6 week(s).  No appts available sooner.

## 2016-04-06 NOTE — Patient Instructions (Addendum)
Tart cherry juice:  Look into concentrate, such as Country Spoon concentrate (use 1 oz / 2 tbsp) or tart cherry juice extract (pill).    GOALS: 1. Protein at each meal (breakfast, lunch, dinner).    - Eggs, meat, fish, poultry, dairy foods.   2. Vegetables at both lunch and dinner.    - Lunch:  Use raw veg's such as cucumber, celery, broccoli, tomatoes, sugar snap peas, bell pepper.       OR: Salad you make with dinner the night before (or leftover cooked veg's).   3. Any carb's chosen, make sure they have at least some (>3 g/serving) fiber.   4. Make sure you are eating at least 3 real meals and 1-2 snack daily, with no more than 4-5 hours between eating.

## 2016-05-18 ENCOUNTER — Ambulatory Visit: Payer: BLUE CROSS/BLUE SHIELD | Admitting: Family Medicine

## 2016-05-25 ENCOUNTER — Ambulatory Visit (INDEPENDENT_AMBULATORY_CARE_PROVIDER_SITE_OTHER): Payer: BLUE CROSS/BLUE SHIELD | Admitting: Student

## 2016-05-25 ENCOUNTER — Encounter: Payer: Self-pay | Admitting: Student

## 2016-05-25 VITALS — Ht 65.0 in | Wt 371.8 lb

## 2016-05-25 DIAGNOSIS — E559 Vitamin D deficiency, unspecified: Secondary | ICD-10-CM | POA: Diagnosis not present

## 2016-05-25 DIAGNOSIS — R7989 Other specified abnormal findings of blood chemistry: Secondary | ICD-10-CM

## 2016-05-25 MED ORDER — TRAMADOL HCL 50 MG PO TABS: 50.0000 mg | ORAL_TABLET | Freq: Three times a day (TID) | ORAL | 3 refills | Status: DC | PRN

## 2016-05-25 NOTE — Patient Instructions (Addendum)
Learn to set limits on family members   - Check on nearest pool and hours   - Get to the pool AT LEAST once per week  Eat more vegetables, FEWER fruits  - Vegetables at least two meals per day  Reduce salt intake    - Limit DAILY SODIUM to 1500 mg per day   -  Limit cheese to 2 ounces per serving  Sleep AT LEAST 7-8 hours per night  - Bedtime no later than 9 PM, even on weekends  - Try to sleep on your side  See your PCP regarding vitamin D and knee pain    - Tramadol was refilled   -  LIMIT the amount of motrin that you take per day   -  Discuss restarting vitamin D with Dr. Gerarda Fraction  Please schedule your follow-up appt for Thur, Jan 4 at 10:30 (60 min).

## 2016-05-25 NOTE — Progress Notes (Signed)
Medical Nutrition Appointment  Assessment:  Primary concerns today:Weight gain, weight 371lb today, from 364 lb on 9/8  Today she discussed her frustration with weight watchers which she has been doing to 6 weeks yet has continued to gain weight. She additionally voices frustation over not being able to exercise. She has not been able to do water aerobics as she is often called by family to run errands for them and she feels this limits her ability to exercise. She has difficulty saying no to them. 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 She ran out of tramadol. She has difficlty walking for exercise due to knee pain but still tries to do it, but with breaks during her walking sessions. For this reason she feels water exercise would be more beneficial  24-hr recall:  (Up at 4:15  AM)  B ( 5:30 AM)-   green tea without sugar two eggs ( sauteed without oil) with tbsp cheese, blackberries 10, 4 oz tart cherry juice Snk ( 10:30 AM)-  pb&j 2 tbs pb, 1 tbs jelly, two pieces wheat bread  L (1:30 PM)- 1.5 cup baked spaghetti + tomato sauce with hamburger meat  Snk ( PM)- none   D ( 6:30 PM)- 1.5 cup spaghetti with tomato sauce and meat sauce tex[place] toast, water 12 oz can of beer Snk ( PM)- no  Typical day? No. baked spaghetti with red meat is different  Recent physical activity includes walking 15-20 mins.  Progress Towards Goal(s):  In progress.      Intervention:  Nutrition, exercise  Teaching Method Utilized: Teach back   Handouts given during visit include:  AVS   Barriers to learning/adherence to lifestyle change: Family needs  Demonstrated degree of understanding via:  Teach Back  Monitoring/Evaluation:  Dietary intake with weight watchers, exercise. Will try to exercise in the pool more. Goals  discussed

## 2016-06-09 ENCOUNTER — Ambulatory Visit: Payer: BLUE CROSS/BLUE SHIELD | Admitting: Obstetrics and Gynecology

## 2016-06-15 ENCOUNTER — Ambulatory Visit: Payer: BLUE CROSS/BLUE SHIELD | Admitting: Family Medicine

## 2016-06-17 ENCOUNTER — Ambulatory Visit: Payer: BLUE CROSS/BLUE SHIELD | Admitting: Obstetrics and Gynecology

## 2016-07-01 ENCOUNTER — Ambulatory Visit: Payer: BLUE CROSS/BLUE SHIELD | Admitting: Obstetrics and Gynecology

## 2016-07-01 NOTE — Progress Notes (Deleted)
     Subjective: No chief complaint on file.    HPI: Valerie Carter is a 56 y.o. presenting to clinic today to discuss the following:  # # #  Health Maintenance: ***     ROS noted in HPI.  Past Medical, Surgical, Social, and Family History Reviewed & Updated per EMR. History  Smoking Status  . Never Smoker  Smokeless Tobacco  . Never Used      Objective: There were no vitals taken for this visit. Vitals and nursing notes reviewed  Physical Exam   No results found for this or any previous visit (from the past 72 hour(s)).  Assessment/Plan: Please see problem based Assessment and Plan  Health Maintainance:   No orders of the defined types were placed in this encounter.   No orders of the defined types were placed in this encounter.    Luiz Blare, DO 07/01/2016, 6:53 AM PGY-3, [place]

## 2016-07-14 ENCOUNTER — Ambulatory Visit: Payer: BLUE CROSS/BLUE SHIELD | Admitting: Obstetrics and Gynecology

## 2016-07-23 ENCOUNTER — Encounter: Payer: Self-pay | Admitting: Obstetrics and Gynecology

## 2016-07-23 ENCOUNTER — Ambulatory Visit (INDEPENDENT_AMBULATORY_CARE_PROVIDER_SITE_OTHER): Payer: BLUE CROSS/BLUE SHIELD | Admitting: Obstetrics and Gynecology

## 2016-07-23 VITALS — BP 134/70 | HR 103 | Temp 97.8°F | Ht 65.0 in | Wt 372.0 lb

## 2016-07-23 DIAGNOSIS — M5442 Lumbago with sciatica, left side: Secondary | ICD-10-CM

## 2016-07-23 DIAGNOSIS — E785 Hyperlipidemia, unspecified: Secondary | ICD-10-CM

## 2016-07-23 DIAGNOSIS — G5603 Carpal tunnel syndrome, bilateral upper limbs: Secondary | ICD-10-CM | POA: Diagnosis not present

## 2016-07-23 DIAGNOSIS — E119 Type 2 diabetes mellitus without complications: Secondary | ICD-10-CM | POA: Diagnosis not present

## 2016-07-23 DIAGNOSIS — M5441 Lumbago with sciatica, right side: Secondary | ICD-10-CM

## 2016-07-23 DIAGNOSIS — G5600 Carpal tunnel syndrome, unspecified upper limb: Secondary | ICD-10-CM | POA: Insufficient documentation

## 2016-07-23 LAB — POCT GLYCOSYLATED HEMOGLOBIN (HGB A1C): HEMOGLOBIN A1C: 7.9

## 2016-07-23 MED ORDER — OXYCODONE-ACETAMINOPHEN 5-325 MG PO TABS: 1.0000 | ORAL_TABLET | Freq: Three times a day (TID) | ORAL | 0 refills | Status: DC | PRN

## 2016-07-23 MED ORDER — PREDNISONE 50 MG PO TABS: ORAL_TABLET | ORAL | 0 refills | Status: DC

## 2016-07-23 MED ORDER — ATORVASTATIN CALCIUM 40 MG PO TABS: 40.0000 mg | ORAL_TABLET | Freq: Every day | ORAL | 3 refills | Status: DC

## 2016-07-23 MED ORDER — DICLOFENAC SODIUM 50 MG PO TBEC: 50.0000 mg | DELAYED_RELEASE_TABLET | Freq: Two times a day (BID) | ORAL | 1 refills | Status: DC

## 2016-07-23 NOTE — Progress Notes (Signed)
Subjective: Chief Complaint  Patient presents with  . Back Pain    bilateral back to leg pain, left side if the worst     HPI: Valerie Carter is a 56 y.o. presenting to clinic today to discuss the following:  #Numbness in Fingers First 3 fingers go numb on both hands Worse at night Will waker her up Turn wrist a cetrtain way will get sharp pain Never been diagnosed with carpal tunnel Doesn't want surgery Nothing tried Wrist splint does help  Travels up into shoulder   #Back Pain States she is hardly able to walk Pain shooting down back of thighs bilaterally with L>R Tried stretching without improvement excrutitung pain - rates it a 9/10 When she walks something is pulling Has not fallen but feels that she may due to a buckling sensation in legs Never had pain this bad Taking motrin a day 8 - 12 a day; not helping and tramadol  Pain started a couple days ago  #Diabetes:  Last A1c 8.4  Sugars - doesn't check sugars Taking medications: yes, PO medications only. 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     ROS noted in HPI.  Past Medical, Surgical, Social, and Family History Reviewed & Updated per EMR. History  Smoking Status  . Never Smoker  Smokeless Tobacco  . Never Used      Objective: BP 134/70   Pulse (!) 103   Temp 97.8 F (36.6 C) (Oral)   Ht 5\' 5"  (1.651 m)   Wt (!) 372 lb (168.7 kg)   LMP 06/22/2016 (Approximate)   SpO2 99%   BMI 61.90 kg/m  Vitals and nursing notes reviewed  Physical Exam  Constitutional: She appears distressed.  Morbidly obese  Cardiovascular: Normal rate, regular rhythm and normal heart sounds.   Pulmonary/Chest: Effort normal and breath sounds normal.  Abdominal: Soft.  There is no tenderness.  Musculoskeletal:       Right hand: Decreased sensation noted. Decreased sensation is present in the medial distribution. Normal strength noted.       Left hand: She exhibits no swelling. Decreased sensation noted. Decreased sensation is present in the medial distribution. Decreased sensation is not present in the ulnar distribution. Normal strength noted.  Tinnel sign of bilateral carpel tunnel negative. Positive Phalen's Test and negative Reverse Phalen's Test  Back Exam:  Inspection: Unremarkable  Motion: limited flexion due to pain SLR laying: Positive on left XSLR laying: Negative  Palpable tenderness: yes FABER: negative. Sensory change: Gross sensation intact to all lumbar and sacral dermatomes.  Reflexes: normal LE Strength intact Gait antalgic.    Diabetic Foot Exam - Simple   Simple Foot Form Diabetic Foot exam was performed with the following findings:  Yes 07/23/2016 11:45 AM  Visual Inspection No deformities, no ulcerations, no other skin breakdown bilaterally:  Yes Sensation Testing Intact to touch and monofilament testing bilaterally:  Yes Pulse Check Posterior Tibialis and Dorsalis pulse intact bilaterally:  Yes Comments      Results for orders placed or performed in visit on 07/23/16 (from the past 72 hour(s))  HgB A1c     Status: None   Collection Time: 07/23/16 10:10 AM  Result Value Ref Range   Hemoglobin A1C 7.9  Assessment/Plan: Please see problem based Assessment and Plan PATIENT EDUCATION PROVIDED: See AVS    Orders Placed This Encounter  Procedures  . HgB A1c    Meds ordered this encounter  Medications  . predniSONE (DELTASONE) 50 MG tablet    Sig: Take one tablet daily for 5 days.    Dispense:  5 tablet    Refill:  0  . oxyCODONE-acetaminophen (ROXICET) 5-325 MG tablet    Sig: Take 1 tablet by mouth every 8 (eight) hours as needed for severe pain.    Dispense:  15 tablet    Refill:  0  . diclofenac  (VOLTAREN) 50 MG EC tablet    Sig: Take 1 tablet (50 mg total) by mouth 2 (two) times daily.    Dispense:  60 tablet    Refill:  1  . atorvastatin (LIPITOR) 40 MG tablet    Sig: Take 1 tablet (40 mg total) by mouth daily.    Dispense:  90 tablet    Refill:  3    Formulary alternative acceptable if cost-effective for pt.     Luiz Blare, DO 07/23/2016, 10:21 AM PGY-3, [place]

## 2016-07-23 NOTE — Patient Instructions (Signed)
Medications prescribed. Take as instructed. See information below and exercises No change to diabetes medications today   Sciatica Sciatica is pain, numbness, weakness, or tingling along the path of the sciatic nerve. The sciatic nerve starts in the lower back and runs down the back of each leg. The nerve controls the muscles in the lower leg and in the back of the knee. It also provides feeling (sensation) to the back of the thigh, the lower leg, and the sole of the foot. Sciatica is a symptom of another medical condition that pinches or puts pressure on the sciatic nerve. Generally, sciatica only affects one side of the body. Sciatica usually goes away on its own or with treatment. In some cases, sciatica may keep coming back (recur). What are the causes? This condition is caused by pressure on the sciatic nerve, or pinching of the sciatic nerve. This may be the result of:  A disk in between the bones of the spine (vertebrae) bulging out too far (herniated disk).  Age-related changes in the spinal disks (degenerative disk disease).  A pain disorder that affects a muscle in the buttock (piriformis syndrome).  Extra bone growth (bone spur) near the sciatic nerve.  An injury or break (fracture) of the pelvis.  Pregnancy.  Tumor (rare). What increases the risk? The following factors may make you more likely to develop this condition:  Playing sports that place pressure or stress on the spine, such as football or weight lifting.  Having poor strength and flexibility.  A history of back injury.  A history of back surgery.  Sitting for long periods of time.  Doing activities that involve repetitive bending or lifting.  Obesity. What are the signs or symptoms? Symptoms can vary from mild to very severe, and they may include:  Any of these problems in the lower back, leg, hip, or buttock:  Mild tingling or dull aches.  Burning sensations.  Sharp pains.  Numbness in the  back of the calf or the sole of the foot.  Leg weakness.  Severe back pain that makes movement difficult. These symptoms may get worse when you cough, sneeze, or laugh, or when you sit or stand for long periods of time. Being overweight may also make symptoms worse. In some cases, symptoms may recur over time. How is this diagnosed? This condition may be diagnosed based on:  Your symptoms.  A physical exam. Your health care provider may ask you to do certain movements to check whether those movements trigger your symptoms.  You may have tests, including:  Blood tests.  X-rays.  MRI.  CT scan. How is this treated? In many cases, this condition improves on its own, without any treatment. However, treatment may include:  Reducing or modifying physical activity during periods of pain.  Exercising and stretching to strengthen your abdomen and improve the flexibility of your spine.  Icing and applying heat to the affected area.  Medicines that help:  To relieve pain and swelling.  To relax your muscles.  Injections of medicines that help to relieve pain, irritation, and inflammation around the sciatic nerve (steroids).  Surgery. Follow these instructions at home: Medicines  Take over-the-counter and prescription medicines only as told by your health care provider.  Do not drive or operate heavy machinery while taking prescription pain medicine. Managing pain  If directed, apply ice to the affected area.  Put ice in a plastic bag.  Place a towel between your skin and the bag.  Leave the  ice on for 20 minutes, 2-3 times a day.  After icing, apply heat to the affected area before you exercise or as often as told by your health care provider. Use the heat source that your health care provider recommends, such as a moist heat pack or a heating pad.  Place a towel between your skin and the heat source.  Leave the heat on for 20-30 minutes.  Remove the heat if your  skin turns bright red. This is especially important if you are unable to feel pain, heat, or cold. You may have a greater risk of getting burned. Activity  Return to your normal activities as told by your health care provider. Ask your health care provider what activities are safe for you.  Avoid activities that make your symptoms worse.  Take brief periods of rest throughout the day. Resting in a lying or standing position is usually better than sitting to rest.  When you rest for longer periods, mix in some mild activity or stretching between periods of rest. This will help to prevent stiffness and pain.  Avoid sitting for long periods of time without moving. Get up and move around at least one time each hour.  Exercise and stretch regularly, as told by your health care provider.  Do not lift anything that is heavier than 10 lb (4.5 kg) while you have symptoms of sciatica. When you do not have symptoms, you should still avoid heavy lifting, especially repetitive heavy lifting.  When you lift objects, always use proper lifting technique, which includes:  Bending your knees.  Keeping the load close to your body.  Avoiding twisting. General instructions  Use good posture.  Avoid leaning forward while sitting.  Avoid hunching over while standing.  Maintain a healthy weight. Excess weight puts extra stress on your back and makes it difficult to maintain good posture.  Wear supportive, comfortable shoes. Avoid wearing high heels.  Avoid sleeping on a mattress that is too soft or too hard. A mattress that is firm enough to support your back when you sleep may help to reduce your pain.  Keep all follow-up visits as told by your health care provider. This is important. Contact a health care provider if:  You have pain that wakes you up when you are sleeping.  You have pain that gets worse when you lie down.  Your pain is worse than you have experienced in the past.  Your pain  lasts longer than 4 weeks.  You experience unexplained weight loss. Get help right away if:  You lose control of your bowel or bladder (incontinence).  You have:  Weakness in your lower back, pelvis, buttocks, or legs that gets worse.  Redness or swelling of your back.  A burning sensation when you urinate. This information is not intended to replace advice given to you by your health care provider. Make sure you discuss any questions you have with your health care provider. Document Released: 06/08/2001 Document Revised: 11/18/2015 Document Reviewed: 02/21/2015 Elsevier Interactive Patient Education  2017 [place].   Sciatica Rehab Ask your health care provider which exercises are safe for you. Do exercises exactly as told by your health care provider and adjust them as directed. It is normal to feel mild stretching, pulling, tightness, or discomfort as you do these exercises, but you should stop right away if you feel sudden pain or your pain gets worse.Do not begin these exercises until told by your health care provider. Stretching and range of  motion exercises These exercises warm up your muscles and joints and improve the movement and flexibility of your hips and your back. These exercises also help to relieve pain, numbness, and tingling. Exercise A: Sciatic nerve glide 1. Sit in a chair with your head facing down toward your chest. Place your hands behind your back. Let your shoulders slump forward. 2. Slowly straighten one of your knees while you tilt your head back as if you are looking toward the ceiling. Only straighten your leg as far as you can without making your symptoms worse. 3. Hold for __________ seconds. 4. Slowly return to the starting position. 5. Repeat with your other leg. Repeat __________ times. Complete this exercise __________ times a day. Exercise B: Knee to chest with hip adduction and internal rotation 1. Lie on your back on a firm surface with both  legs straight. 2. Bend one of your knees and move it up toward your chest until you feel a gentle stretch in your lower back and buttock. Then, move your knee toward the shoulder that is on the opposite side from your leg.  Hold your leg in this position by holding onto the front of your knee. 3. Hold for __________ seconds. 4. Slowly return to the starting position. 5. Repeat with your other leg. Repeat __________ times. Complete this exercise __________ times a day. Exercise C: Prone extension on elbows 1. Lie on your abdomen on a firm surface. A bed may be too soft for this exercise. 2. Prop yourself up on your elbows. 3. Use your arms to help lift your chest up until you feel a gentle stretch in your abdomen and your lower back.  This will place some of your body weight on your elbows. If this is uncomfortable, try stacking pillows under your chest.  Your hips should stay down, against the surface that you are lying on. Keep your hip and back muscles relaxed. 4. Hold for __________ seconds. 5. Slowly relax your upper body and return to the starting position. Repeat __________ times. Complete this exercise __________ times a day. Strengthening exercises These exercises build strength and endurance in your back. Endurance is the ability to use your muscles for a long time, even after they get tired. Exercise D: Pelvic tilt 1. Lie on your back on a firm surface. Bend your knees and keep your feet flat. 2. Tense your abdominal muscles. Tip your pelvis up toward the ceiling and flatten your lower back into the floor.  To help with this exercise, you may place a small towel under your lower back and try to push your back into the towel. 3. Hold for __________ seconds. 4. Let your muscles relax completely before you repeat this exercise. Repeat __________ times. Complete this exercise __________ times a day. Exercise E: Alternating arm and leg raises 1. Get on your hands and knees on a  firm surface. If you are on a hard floor, you may want to use padding to cushion your knees, such as an exercise mat. 2. Line up your arms and legs. Your hands should be below your shoulders, and your knees should be below your hips. 3. Lift your left leg behind you. At the same time, raise your right arm and straighten it in front of you.  Do not lift your leg higher than your hip.  Do not lift your arm higher than your shoulder.  Keep your abdominal and back muscles tight.  Keep your hips facing the ground.  Do not  arch your back.  Keep your balance carefully, and do not hold your breath. 4. Hold for __________ seconds. 5. Slowly return to the starting position and repeat with your right leg and your left arm. Repeat __________ times. Complete this exercise __________ times a day. Posture and body mechanics   Body mechanics refers to the movements and positions of your body while you do your daily activities. Posture is part of body mechanics. Good posture and healthy body mechanics can help to relieve stress in your body's tissues and joints. Good posture means that your spine is in its natural S-curve position (your spine is neutral), your shoulders are pulled back slightly, and your head is not tipped forward. The following are general guidelines for applying improved posture and body mechanics to your everyday activities. Standing   When standing, keep your spine neutral and your feet about hip-width apart. Keep a slight bend in your knees. Your ears, shoulders, and hips should line up.  When you do a task in which you stand in one place for a long time, place one foot up on a stable object that is 2-4 inches (5-10 cm) high, such as a footstool. This helps keep your spine neutral. Sitting  When sitting, keep your spine neutral and keep your feet flat on the floor. Use a footrest, if necessary, and keep your thighs parallel to the floor. Avoid rounding your shoulders, and avoid  tilting your head forward.  When working at a desk or a computer, keep your desk at a height where your hands are slightly lower than your elbows. Slide your chair under your desk so you are close enough to maintain good posture.  When working at a computer, place your monitor at a height where you are looking straight ahead and you do not have to tilt your head forward or downward to look at the screen. Resting   When lying down and resting, avoid positions that are most painful for you.  If you have pain with activities such as sitting, bending, stooping, or squatting (flexion-based activities), lie in a position in which your body does not bend very much. For example, avoid curling up on your side with your arms and knees near your chest (fetal position).  If you have pain with activities such as standing for a long time or reaching with your arms (extension-based activities), lie with your spine in a neutral position and bend your knees slightly. Try the following positions:  Lying on your side with a pillow between your knees.  Lying on your back with a pillow under your knees. Lifting   When lifting objects, keep your feet at least shoulder-width apart and tighten your abdominal muscles.  Bend your knees and hips and keep your spine neutral. It is important to lift using the strength of your legs, not your back. Do not lock your knees straight out.  Always ask for help to lift heavy or awkward objects. This information is not intended to replace advice given to you by your health care provider. Make sure you discuss any questions you have with your health care provider. Document Released: 06/14/2005 Document Revised: 02/19/2016 Document Reviewed: 02/28/2015 Elsevier Interactive Patient Education  2017 [employer].

## 2016-07-24 LAB — BASIC METABOLIC PANEL WITH GFR
BUN: 17 mg/dL (ref 7–25)
CHLORIDE: 104 mmol/L (ref 98–110)
CO2: 19 mmol/L — ABNORMAL LOW (ref 20–31)
Calcium: 9.4 mg/dL (ref 8.6–10.4)
Creat: 1.03 mg/dL (ref 0.50–1.05)
GFR, EST AFRICAN AMERICAN: 71 mL/min (ref >=60)
GFR, Est Non African American: 61 mL/min (ref >=60)
Glucose, Bld: 235 mg/dL — ABNORMAL HIGH (ref 65–99)
POTASSIUM: 4.3 mmol/L (ref 3.5–5.3)
SODIUM: 139 mmol/L (ref 135–146)

## 2016-07-28 NOTE — Assessment & Plan Note (Signed)
Difficult to discern due to large statue. No red flags. However top differential includes bilateral sciatica with L>R vs muscle spasm vs lumbar radiculopathy. No lumbar imaging found in chart to assess lumbar anatomy. Patient would like to hold off at this time. Counseled on how important this imaging may be and we will need it in future. Will acutely treat for this severe sciatica pain with short burst of steroids. Rx for #15 roxicet given. After steroids patient can use diclofenac for back pain. Back exercises given to patient to try at home. Continue other conservative measures such as heat and massage. Follow-up in 3-4 weeks or sooner if not improving.

## 2016-07-28 NOTE — Assessment & Plan Note (Signed)
Patient was non-compliant with statin. Discussed ASCVSD risk of patient. She opted to take statin after risk benefits addressed. Will reorder.

## 2016-07-28 NOTE — Assessment & Plan Note (Signed)
Exam and history consistent with carpel tunnel syndrome bilaterally. Patient wants to wait on any referrals at this time. Encouraged NSAID use and splinting wrist to help with decreasing inflammation and pain.

## 2016-07-28 NOTE — Assessment & Plan Note (Signed)
A1c improved to 7.9. Continue current therapies. Encouraged lifestyle changes and weight loss to help patient avoid insulin in future. Advised patient that checking blood sugars is important. Foot exam performed today unremarkable. Will collect blood work to look at renal function. Follow-up diabetes in 3 months.

## 2016-08-03 ENCOUNTER — Ambulatory Visit: Payer: BLUE CROSS/BLUE SHIELD | Admitting: Family Medicine

## 2016-08-13 ENCOUNTER — Telehealth: Payer: Self-pay | Admitting: Obstetrics and Gynecology

## 2016-08-13 NOTE — Telephone Encounter (Signed)
Dr Gerarda Fraction advised follow up in 3-4 weeks if her pain was not improving in her last note. I recommend that the patient make an appointment at the [facility] for further evaluation.  Algis Greenhouse. Jerline Pain, [place] Resident PGY-3 08/13/2016 12:32 PM

## 2016-08-13 NOTE — Telephone Encounter (Signed)
prednisone and diclofenac that was given for bck 2 weeks ago isnt working. What does dr advise to do?

## 2016-08-13 NOTE — Telephone Encounter (Signed)
Will forward to MD to advise. Johney Perotti,CMA  

## 2016-08-13 NOTE — Telephone Encounter (Signed)
Spoke with patient and made her an appointment for 08-18-16 with PCP to follow up on this pain. Lydell Moga,CMA

## 2016-08-18 ENCOUNTER — Encounter: Payer: Self-pay | Admitting: Obstetrics and Gynecology

## 2016-08-18 ENCOUNTER — Ambulatory Visit (INDEPENDENT_AMBULATORY_CARE_PROVIDER_SITE_OTHER): Payer: BLUE CROSS/BLUE SHIELD | Admitting: Obstetrics and Gynecology

## 2016-08-18 ENCOUNTER — Ambulatory Visit
Admission: RE | Admit: 2016-08-18 | Discharge: 2016-08-18 | Disposition: A | Discharge status: Home / Self Care | Payer: BLUE CROSS/BLUE SHIELD | Source: Ambulatory Visit | Attending: Family Medicine | Admitting: Family Medicine

## 2016-08-18 VITALS — BP 138/64 | HR 72 | Temp 98.6°F | Ht 65.0 in | Wt 366.0 lb

## 2016-08-18 DIAGNOSIS — E669 Obesity, unspecified: Secondary | ICD-10-CM

## 2016-08-18 DIAGNOSIS — M5441 Lumbago with sciatica, right side: Secondary | ICD-10-CM | POA: Diagnosis not present

## 2016-08-18 DIAGNOSIS — M5442 Lumbago with sciatica, left side: Secondary | ICD-10-CM

## 2016-08-18 DIAGNOSIS — IMO0001 Reserved for inherently not codable concepts without codable children: Secondary | ICD-10-CM

## 2016-08-18 DIAGNOSIS — Z6841 Body Mass Index (BMI) 40.0 and over, adult: Secondary | ICD-10-CM | POA: Diagnosis not present

## 2016-08-18 DIAGNOSIS — M47816 Spondylosis without myelopathy or radiculopathy, lumbar region: Secondary | ICD-10-CM | POA: Diagnosis not present

## 2016-08-18 MED ORDER — PREGABALIN 50 MG PO CAPS: 50.0000 mg | ORAL_CAPSULE | Freq: Two times a day (BID) | ORAL | 1 refills | Status: DC

## 2016-08-18 NOTE — Patient Instructions (Addendum)
Go get back x-ray Start Lyrica for nerve pain Continue diclofenac as needed for pain. Can start to wean off Continue to work on losing weight. You are headed in the right direction! Referral placed for bariatric surgery  Calorie Counting for Weight Loss Calories are energy you get from the things you eat and drink. Your body uses this energy to keep you going throughout the day. The number of calories you eat affects your weight. When you eat more calories than your body needs, your body stores the extra calories as fat. When you eat fewer calories than your body needs, your body burns fat to get the energy it needs. Calorie counting means keeping track of how many calories you eat and drink each day. If you make sure to eat fewer calories than your body needs, you should lose weight. In order for calorie counting to work, you will need to eat the number of calories that are right for you in a day to lose a healthy amount of weight per week. A healthy amount of weight to lose per week is usually 1-2 lb (0.5-0.9 kg). A dietitian can determine how many calories you need in a day and give you suggestions on how to reach your calorie goal.  WHAT IS MY MY PLAN? My goal is to have __________ calories per day.  If I have this many calories per day, I should lose around __________ pounds per week. WHAT DO I NEED TO KNOW ABOUT CALORIE COUNTING? In order to meet your daily calorie goal, you will need to:  Find out how many calories are in each food you would like to eat. Try to do this before you eat.  Decide how much of the food you can eat.  Write down what you ate and how many calories it had. Doing this is called keeping a food log. WHERE DO I FIND CALORIE INFORMATION? The number of calories in a food can be found on a Nutrition Facts label. Note that all the information on a label is based on a specific serving of the food. If a food does not have a Nutrition Facts label, try to look up the calories  online or ask your dietitian for help. HOW DO I DECIDE HOW MUCH TO EAT? To decide how much of the food you can eat, you will need to consider both the number of calories in one serving and the size of one serving. This information can be found on the Nutrition Facts label. If a food does not have a Nutrition Facts label, look up the information online or ask your dietitian for help. Remember that calories are listed per serving. If you choose to have more than one serving of a food, you will have to multiply the calories per serving by the amount of servings you plan to eat. For example, the label on a package of bread might say that a serving size is 1 slice and that there are 90 calories in a serving. If you eat 1 slice, you will have eaten 90 calories. If you eat 2 slices, you will have eaten 180 calories. HOW DO I KEEP A FOOD LOG? After each meal, record the following information in your food log:  What you ate.  How much of it you ate.  How many calories it had.  Then, add up your calories. Keep your food log near you, such as in a small notebook in your pocket. Another option is to use a mobile app  or website. Some programs will calculate calories for you and show you how many calories you have left each time you add an item to the log. WHAT ARE SOME CALORIE COUNTING TIPS?  Use your calories on foods and drinks that will fill you up and not leave you hungry. Some examples of this include foods like nuts and nut butters, vegetables, lean proteins, and high-fiber foods (more than 5 g fiber per serving).  Eat nutritious foods and avoid empty calories. Empty calories are calories you get from foods or beverages that do not have many nutrients, such as candy and soda. It is better to have a nutritious high-calorie food (such as an avocado) than a food with few nutrients (such as a bag of chips).  Know how many calories are in the foods you eat most often. This way, you do not have to look up  how many calories they have each time you eat them.  Look out for foods that may seem like low-calorie foods but are really high-calorie foods, such as baked goods, soda, and fat-free candy.  Pay attention to calories in drinks. Drinks such as sodas, specialty coffee drinks, alcohol, and juices have a lot of calories yet do not fill you up. Choose low-calorie drinks like water and diet drinks.  Focus your calorie counting efforts on higher calorie items. Logging the calories in a garden salad that contains only vegetables is less important than calculating the calories in a milk shake.  Find a way of tracking calories that works for you. Get creative. Most people who are successful find ways to keep track of how much they eat in a day, even if they do not count every calorie. WHAT ARE SOME PORTION CONTROL TIPS?  Know how many calories are in a serving. This will help you know how many servings of a certain food you can have.  Use a measuring cup to measure serving sizes. This is helpful when you start out. With time, you will be able to estimate serving sizes for some foods.  Take some time to put servings of different foods on your favorite plates, bowls, and cups so you know what a serving looks like.  Try not to eat straight from a bag or box. Doing this can lead to overeating. Put the amount you would like to eat in a cup or on a plate to make sure you are eating the right portion.  Use smaller plates, glasses, and bowls to prevent overeating. This is a quick and easy way to practice portion control. If your plate is smaller, less food can fit on it.  Try not to multitask while eating, such as watching TV or using your computer. If it is time to eat, sit down at a table and enjoy your food. Doing this will help you to start recognizing when you are full. It will also make you more aware of what and how much you are eating. HOW CAN I CALORIE COUNT WHEN EATING OUT?  Ask for smaller portion  sizes or child-sized portions.  Consider sharing an entree and sides instead of getting your own entree.  If you get your own entree, eat only half. Ask for a box at the beginning of your meal and put the rest of your entree in it so you are not tempted to eat it.  Look for the calories on the menu. If calories are listed, choose the lower calorie options.  Choose dishes that include vegetables, fruits, whole  grains, low-fat dairy products, and lean protein. Focusing on smart food choices from each of the 5 food groups can help you stay on track at restaurants.  Choose items that are boiled, broiled, grilled, or steamed.  Choose water, milk, unsweetened iced tea, or other drinks without added sugars. If you want an alcoholic beverage, choose a lower calorie option. For example, a regular margarita can have up to 700 calories and a glass of wine has around 150.  Stay away from items that are buttered, battered, fried, or served with cream sauce. Items labeled "crispy" are usually fried, unless stated otherwise.  Ask for dressings, sauces, and syrups on the side. These are usually very high in calories, so do not eat much of them.  Watch out for salads. Many people think salads are a healthy option, but this is often not the case. Many salads come with bacon, fried chicken, lots of cheese, fried chips, and dressing. All of these items have a lot of calories. If you want a salad, choose a garden salad and ask for grilled meats or steak. Ask for the dressing on the side, or ask for olive oil and vinegar or lemon to use as dressing.  Estimate how many servings of a food you are given. For example, a serving of cooked rice is  cup or about the size of half a tennis ball or one cupcake wrapper. Knowing serving sizes will help you be aware of how much food you are eating at restaurants. The list below tells you how big or small some common portion sizes are based on everyday objects.  1 oz-4 stacked  dice.  3 oz-1 deck of cards.  1 tsp-1 dice.  1 Tbsp- a Ping-Pong ball.  2 Tbsp-1 Ping-Pong ball.   cup-1 tennis ball or 1 cupcake wrapper.  1 cup-1 baseball. This information is not intended to replace advice given to you by your health care provider. Make sure you discuss any questions you have with your health care provider. Document Released: 06/14/2005 Document Revised: 07/05/2014 Document Reviewed: 04/19/2013 Elsevier Interactive Patient Education  2017 [employer].

## 2016-08-18 NOTE — Progress Notes (Signed)
   Subjective:   Patient ID: Valerie Carter, female    DOB: September 29, 1960, 56 y.o.   MRN: [id number]  Patient presents for Same Day Appointment  Chief Complaint  Patient presents with  . Back Pain    HPI: # BACK PAIN Presented about a month ago for low back pain. Patient states that she continues to have back pain with bilateral sciatica. States that she completed course of steroids. Steroids were not tolerated well with her stomach but that may have caused decrease in her pain. Now takes diclofenac daily. States that that is helping her knees but hasn't noticed much in the way of improvement of her sciatica for her back pain. Unable to tolerate gabapentin. Wants to discuss possible options to help improve pain. Has been working on losing weight. Does water aerobics.   Symptoms Incontinence of bowel or bladder:  no Fever: no  Review of Systems   See HPI for ROS.   History  Smoking Status  . Never Smoker  Smokeless Tobacco  . Never Used    Past medical history, surgical, family, and social history reviewed and updated in the EMR as appropriate.  Pertinent Historical Findings include: Objective:  BP 138/64   Pulse 72   Temp 98.6 F (37 C) (Oral)   Ht 5\' 5"  (1.651 m)   Wt (!) 366 lb (166 kg)   LMP 07/18/2016   SpO2 99%   BMI 60.91 kg/m  Vitals and nursing note reviewed  Physical Exam Constitutional: NAD Morbidly obese  Cardiovascular: Normal rate, regular rhythm and normal heart sounds.   Pulmonary/Chest: Effort normal and breath sounds normal.   Back Exam:  Inspection: Unremarkable  Motion: limited flexion due to pain SLR laying: Positive on left XSLR laying: Negative  Palpable tenderness: yes FABER: negative. Sensory change: Gross sensation intact to all lumbar and sacral dermatomes.  Reflexes: normal LE Strength intact Gait antalgic.  Assessment & Plan:  Please see separate assessment and plan  Diagnosis and plan along with any newly prescribed  medication(s) were discussed in detail with this patient today. The patient verbalized understanding and agreed with the plan. Patient advised if symptoms worsen return to clinic or ER.   PATIENT EDUCATION PROVIDED: See AVS   Luiz Blare, DO 08/18/2016, 10:16 AM PGY-3, [place]

## 2016-08-20 ENCOUNTER — Other Ambulatory Visit: Payer: Self-pay | Admitting: Obstetrics and Gynecology

## 2016-08-20 ENCOUNTER — Telehealth: Payer: Self-pay | Admitting: *Deleted

## 2016-08-20 MED ORDER — OXYCODONE-ACETAMINOPHEN 5-325 MG PO TABS: 1.0000 | ORAL_TABLET | Freq: Four times a day (QID) | ORAL | 0 refills | Status: DC | PRN

## 2016-08-20 NOTE — Telephone Encounter (Signed)
Discussed the above with patient. Will schedule follow-up appointment in near future to discuss treatment options. Will likely need MRI.

## 2016-08-20 NOTE — Telephone Encounter (Signed)
Pt calling in stating that she doesn't understand her x-ray results and wants dr to call her and explain them. Also she says she cant afford lyrica prescribed to her because it is $500 a month after insurance. Wants to know is there something else that could be called in. Please advise.Deseree Kennon Holter, CMA

## 2016-08-23 NOTE — Assessment & Plan Note (Signed)
BMI 60. Patient working on weight loss. Discussed healthy lifestyle choices in diet and exercise to help with weight loss. Referral placed for bariatric surgery. Believe her weight is causing significant comorbidites in her life.

## 2016-08-23 NOTE — Assessment & Plan Note (Signed)
Continues to endorse back pain that is severe. Will get back imaging today. Will likely need MRI next. Was going to wirte Rx for Lyrica but patient cannot afford. Will give RX for Roxicet. Continue diclofenac as needed for pain. Can start to wean off. Discussed possible epidural injections into back for pain. Will make further treatment plans after imaging reviewed.

## 2016-08-27 ENCOUNTER — Encounter: Payer: Self-pay | Admitting: Family Medicine

## 2016-08-27 ENCOUNTER — Ambulatory Visit (INDEPENDENT_AMBULATORY_CARE_PROVIDER_SITE_OTHER): Payer: BLUE CROSS/BLUE SHIELD | Admitting: Family Medicine

## 2016-08-27 DIAGNOSIS — M170 Bilateral primary osteoarthritis of knee: Secondary | ICD-10-CM

## 2016-08-27 DIAGNOSIS — E669 Obesity, unspecified: Secondary | ICD-10-CM | POA: Diagnosis not present

## 2016-08-27 DIAGNOSIS — Z6841 Body Mass Index (BMI) 40.0 and over, adult: Secondary | ICD-10-CM | POA: Diagnosis not present

## 2016-08-27 DIAGNOSIS — IMO0001 Reserved for inherently not codable concepts without codable children: Secondary | ICD-10-CM

## 2016-08-27 MED ORDER — METHYLPREDNISOLONE ACETATE 40 MG/ML IJ SUSP
40.0000 mg | Freq: Once | INTRAMUSCULAR | Status: AC
  Administered 2016-08-27: 40 mg via INTRA_ARTICULAR

## 2016-08-27 MED ORDER — AMITRIPTYLINE HCL 50 MG PO TABS: 50.0000 mg | ORAL_TABLET | Freq: Every day | ORAL | 1 refills | Status: DC

## 2016-08-27 NOTE — Assessment & Plan Note (Signed)
>>  ASSESSMENT AND PLAN FOR ARTHRITIS OF KNEE, DEGENERATIVE WRITTEN ON 08/27/2016  6:05 PM BY Briseyda Fehr L, MD  Bilateral DJD. Corticosteroid injections today. Weight loss as the necessity and she's aware of this.

## 2016-08-27 NOTE — Progress Notes (Signed)
    CHIEF COMPLAINT / HPI:   #1. Bilateral knee pain. She had corticosteroid injection several months ago and it helped for about 2 and half months. Since then she's had readily increasing knee pain tunnel she's barely able to walk. She continues to drive a bus full-time. Getting on off the bus is quite difficult secondary to knee pain.  #2. Back pain: She's also having some low back pain and saw her PCP for this. They did some x-rays and put her on Lyrica but she was unable to afford that. #3. Obesity: She has started the investigation for area after surgery. She's considering that over the summer.  REVIEW OF SYSTEMS:  No weight change. No fevers area she's had some shooting pains from her low back on the left side down into the buttock area and occasionally down into the thigh. She has bilateral occasional foot numbness but this does not necessarily coincide with the radiating pain from her left low back area.  OBJECTIVE:  Vital signs are reviewed.   GEN.: Obese female no acute distress KNEES: Bilaterally she has medial and lateral joint line tenderness. She still has full extension and flexion bilaterally. She has some external changes of osteoarthritis. Popliteal space is benign. Calf is soft. VASCULAR: Dorsalis pedis and posterior tibialis pulses are 2+ bilaterally equal BACK: Mild tenderness to palpation in the paravertebral muscle area on both sides in the lumbar area. She has normal flexion at the hips. Straight leg raise negative bilaterally.  INJECTION: Patient was given informed consent, signed copy in the chart. Appropriate time out was taken. Area prepped and draped in usual sterile fashion. 1 cc of methylprednisolone 40 mg/ml plus  4 cc of 1% lidocaine without epinephrine was injected into the bilateral knees using a(n) anterior medial approach. The patient tolerated the procedure well. There were no complications. Post procedure instructions were given.   ASSESSMENT /  PLAN: For her back pain, I will try her on some amitriptyline. She was unable to afford Lyrica. I suspect her recent back issues are related to her bilateral knee DJD. Her gait is quite antalgic. I'll follow up with her in 3-4 weeks regarding this. Please see problem oriented charting for details

## 2016-08-27 NOTE — Assessment & Plan Note (Signed)
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 We discussed this at some length spinning greater than 50% of our 25 minute office visit in counseling and education regarding this issue. She's considering gastric sleeve. I answers questions for her.

## 2016-08-27 NOTE — Assessment & Plan Note (Signed)
Bilateral DJD. Corticosteroid injections today. Weight loss as the necessity and she's aware of this.

## 2016-08-31 ENCOUNTER — Ambulatory Visit (INDEPENDENT_AMBULATORY_CARE_PROVIDER_SITE_OTHER): Payer: BLUE CROSS/BLUE SHIELD | Admitting: Family Medicine

## 2016-08-31 ENCOUNTER — Encounter: Payer: Self-pay | Admitting: Family Medicine

## 2016-08-31 DIAGNOSIS — E119 Type 2 diabetes mellitus without complications: Secondary | ICD-10-CM | POA: Diagnosis not present

## 2016-08-31 DIAGNOSIS — Z6841 Body Mass Index (BMI) 40.0 and over, adult: Secondary | ICD-10-CM | POA: Diagnosis not present

## 2016-08-31 DIAGNOSIS — E669 Obesity, unspecified: Secondary | ICD-10-CM

## 2016-08-31 DIAGNOSIS — IMO0001 Reserved for inherently not codable concepts without codable children: Secondary | ICD-10-CM

## 2016-08-31 NOTE — Patient Instructions (Addendum)
-   Omega-3 supplement:  Check label to see if it tells how much EPA and DHA are in it.  If the label doesn't tell the specific amounts, choose another brand that does.    - Check the nutrition facts on the creamer you are using.   - Consider using vanilla soymilk in your coffee instead of the flavored creamers.    - Ask your niece Niger if she will help you with some meal prep on weekends, which will help ensure having more veg's.   GOALS remain the same: 1. Protein at each meal (breakfast, lunch, dinner).               - Eggs, meat, fish, poultry, dairy foods.   2. Vegetables at both lunch and dinner.               - Lunch:  Use raw veg's such as cucumber, celery, broccoli, tomatoes, sugar snap peas, bell pepper.                             OR: Salad you make with dinner the night before (or leftover cooked veg's).   3. Make sure you are eating at least 3 real meals and 1-2 snack daily, with no more than 4-5 hours between eating.   4. Continue water exercise 3 X wk and your KB workout at home 2 X wk.    - Make a list of 7-10 dinner meals that taste good, are relatively quick and easy to prepare, and that meet your nutritional needs.  Use this as a basis for shopping, so you can make one of these meals any time.  Bring your list to your follow-up appointment on Mar 29 for review.

## 2016-08-31 NOTE — Progress Notes (Signed)
Medical Nutrition Therapy:  Appt start time: 1030 end time:  1130.  Assessment:  Primary concerns today: Weight management and Blood sugar control.   Prayer has been making good food choices, and has started using some Lactaid pills so she can use milk products, which is helpful in meeting protein needs.  Usually getting veg's for both lunch and dinner about 3 X wk.  She has been going to water exercise 3 X wk, and recently bought some kettlebells (5, 10, & 15 lb), so plans to start using them 2 X wk for a home workout.    Kristine did the [facility] seminar online, and has submitted paperwork for candidacy for bariatric surgery.  She is still very reluctant to pursue surgery, but is at least open to considering it.  Dior has lost 7 lb since November, but wt today is still up 5 lb since a low of 358 in October 2017.    24-hr recall:  (Up at 4:15 AM) B (5:30 AM)-  Equate shake (160 kcal), 6 oz Yoplait yogurt (90 kcal), coffee, 2 T flvr'd creamer (?kcal) 550 Snk ( AM)-  --- L (12:30 PM)-  Steak&chs (3-egg) omelette, 2 T grn peppers&onions, water Snk (2:45)-  1 orange, 6 oz Yoplait yogurt D (7 PM)-  6 oz salmon, tomato, 1/2 c pot salad, water Snk ( PM)-  --- Typical day? Yes.    Progress Towards Goal(s):  In progress.   Nutritional Diagnosis:  Slight progress on [place]-3.3 Overweight/obesity As related to energy imbalance.  As evidenced by weight loss of 7 lb in 3 1/2 months.    Intervention:  Nutrition education.  Handouts given during visit include:  AVS  Demonstrated degree of understanding via:  Teach Back   Monitoring/Evaluation:  Dietary intake, exercise, BG, and body weight in 3 week(s)  In Nutrition Clinic with Dr. Gerarda Fraction.

## 2016-09-03 ENCOUNTER — Ambulatory Visit: Payer: BLUE CROSS/BLUE SHIELD | Admitting: Family Medicine

## 2016-09-10 ENCOUNTER — Encounter: Payer: Self-pay | Admitting: Obstetrics and Gynecology

## 2016-09-10 ENCOUNTER — Ambulatory Visit (INDEPENDENT_AMBULATORY_CARE_PROVIDER_SITE_OTHER): Payer: BLUE CROSS/BLUE SHIELD | Admitting: Obstetrics and Gynecology

## 2016-09-10 VITALS — BP 124/78 | HR 102 | Temp 97.8°F | Wt 363.0 lb

## 2016-09-10 DIAGNOSIS — M5442 Lumbago with sciatica, left side: Secondary | ICD-10-CM | POA: Diagnosis not present

## 2016-09-10 DIAGNOSIS — M5441 Lumbago with sciatica, right side: Secondary | ICD-10-CM

## 2016-09-10 MED ORDER — METHYLPREDNISOLONE ACETATE 40 MG/ML IJ SUSP
40.0000 mg | Freq: Once | INTRAMUSCULAR | Status: AC
  Administered 2016-09-10: 40 mg via INTRAMUSCULAR

## 2016-09-10 MED ORDER — KETOROLAC TROMETHAMINE 30 MG/ML IJ SOLN
30.0000 mg | Freq: Once | INTRAMUSCULAR | Status: AC
  Administered 2016-09-10: 30 mg via INTRAMUSCULAR

## 2016-09-10 NOTE — Progress Notes (Signed)
   Subjective:   Patient ID: Valerie Carter, female    DOB: 08/22/1960, 56 y.o.   MRN: [id number]  Patient presents for Same Day Appointment  Chief Complaint  Patient presents with  . Back Pain    HPI: # BACK PAIN Continues to endorse persistent chronic low back pain. Been present for about 2 months. Continues to have back pain with bilateral sciatica. Therapies thus far has not helped with back pain. She has completed by mouth steroids, gabapentin, amitriptyline. Unable to take the gabapentin or amitriptyline due to increased somnolence. Takes diclofenac. Wants more to be done to improve back pain. Has been working on losing weight. Does water aerobics.   Symptoms Incontinence of bowel or bladder:  no Fever: no  Review of Systems   See HPI for ROS.   History  Smoking Status  . Never Smoker  Smokeless Tobacco  . Never Used    Past medical history, surgical, family, and social history reviewed and updated in the EMR as appropriate.  Pertinent Historical Findings include: Obesity Objective:  BP 124/78   Pulse (!) 102   Temp 97.8 F (36.6 C) (Oral)   Wt (!) 363 lb (164.7 kg)   SpO2 99%   BMI 60.41 kg/m  Vitals and nursing note reviewed  Physical Exam Constitutional: NAD, morbidly obese   Back Exam:  Inspection: Unremarkable  Motion: limited flexion due to pain SLR laying: Positive on left XSLR laying: Negative  Palpable tenderness: yes FABER: negative. Reflexes: normal LE Strength intact Gait antalgic.  Assessment & Plan:  Please see separate assessment and plan  Diagnosis and plan along with any newly prescribed medication(s) were discussed in detail with this patient today. The patient verbalized understanding and agreed with the plan. Patient advised if symptoms worsen return to clinic or ER.   PATIENT EDUCATION PROVIDED: See AVS   Luiz Blare, DO 09/10/2016, 10:53 AM PGY-3, [place]

## 2016-09-10 NOTE — Progress Notes (Deleted)
   Subjective:   Patient ID: Valerie Carter, female    DOB: Jan 04, 1961, 56 y.o.   MRN: [id number]  Patient presents for Same Day Appointment  Chief Complaint  Patient presents with  . Back Pain    HPI: #Back Pain   Review of Systems   See HPI for ROS.   History  Smoking Status  . Never Smoker  Smokeless Tobacco  . Never Used    Past medical history, surgical, family, and social history reviewed and updated in the EMR as appropriate.  Pertinent Historical Findings include: Objective:  BP 124/78   Pulse (!) 102   Temp 97.8 F (36.6 C) (Oral)   Wt (!) 363 lb (164.7 kg)   SpO2 99%   BMI 60.41 kg/m  Vitals and nursing note reviewed  Physical Exam  Assessment & Plan:  There are no diagnoses linked to this encounter.    Diagnosis and plan along with any newly prescribed medication(s) were discussed in detail with this patient today. The patient verbalized understanding and agreed with the plan. Patient advised if symptoms worsen return to clinic or ER.   PATIENT EDUCATION PROVIDED: See AVS   Luiz Blare, DO 09/10/2016, 10:20 AM PGY-3, [place]

## 2016-09-10 NOTE — Patient Instructions (Addendum)
MRI ordered for back pain Referral placed to back doctors Continue to stay active  Injections given for back pain today

## 2016-09-10 NOTE — Assessment & Plan Note (Addendum)
Persistent severe back pain. No therapy so far has helped. Reviewed DG lumbar spine from last visit which showed progression of DJD of lumbar spine. Patient's symptoms consistent with discogenic nerve pain. Order placed for MRI. Also referral placed for patient to go to back/spinal specialist. Wants to consider other therapy such as epidural spinal injections. Patient given Toradol and Depo-Medrol injections today to help with pain.

## 2016-09-16 ENCOUNTER — Telehealth: Payer: Self-pay | Admitting: Obstetrics and Gynecology

## 2016-09-16 DIAGNOSIS — M5442 Lumbago with sciatica, left side: Principal | ICD-10-CM

## 2016-09-16 DIAGNOSIS — M5441 Lumbago with sciatica, right side: Secondary | ICD-10-CM

## 2016-09-16 NOTE — Telephone Encounter (Signed)
Will forward to referral coordinator to see if this location can be changed without having to change the order, especially since it has been scheduled. Jazmin Hartsell,CMA

## 2016-09-16 NOTE — Telephone Encounter (Signed)
Pt is scheduled to get an MRI at [facility] on Monday, but pt would like to go to [place] because pt will have a smaller co-pay. ep

## 2016-09-16 NOTE — Telephone Encounter (Signed)
Yes the location can be changed, Please call centralized scheduling and cancel the old appointment, and then schedule the new appt at [facility] imaging. I will take care of changing the POS with in the pre-cert with [employer].

## 2016-09-17 NOTE — Telephone Encounter (Signed)
Please make "Preferred imaging location" [facility] in order. They will automatically get the order and schedule with patient.

## 2016-09-17 NOTE — Telephone Encounter (Signed)
Tried to call  imaging to schedule this per patient's request but they stated that a new order needed to be placed since it has already been linked to [facility] (even though I cancelled that appointment).  Will forward to MD and also referral coordinator so order can be faxed once it has been placed. Kairyn Olmeda,CMA

## 2016-09-17 NOTE — Telephone Encounter (Signed)
Done. Thanks everyone

## 2016-09-17 NOTE — Telephone Encounter (Signed)
Pre-Cert has been updated with [employer]

## 2016-09-17 NOTE — Telephone Encounter (Signed)
Order printed and faxed to [place] [facility]. Shandreka Dante,CMA

## 2016-09-20 ENCOUNTER — Ambulatory Visit (HOSPITAL_COMMUNITY): Payer: BLUE CROSS/BLUE SHIELD

## 2016-09-23 ENCOUNTER — Encounter: Payer: Self-pay | Admitting: Obstetrics and Gynecology

## 2016-09-23 ENCOUNTER — Ambulatory Visit (INDEPENDENT_AMBULATORY_CARE_PROVIDER_SITE_OTHER): Payer: BLUE CROSS/BLUE SHIELD | Admitting: Obstetrics and Gynecology

## 2016-09-23 VITALS — Ht 65.0 in | Wt 358.8 lb

## 2016-09-23 DIAGNOSIS — E119 Type 2 diabetes mellitus without complications: Secondary | ICD-10-CM | POA: Diagnosis not present

## 2016-09-23 DIAGNOSIS — Z713 Dietary counseling and surveillance: Secondary | ICD-10-CM | POA: Diagnosis not present

## 2016-09-23 DIAGNOSIS — Z6841 Body Mass Index (BMI) 40.0 and over, adult: Secondary | ICD-10-CM | POA: Diagnosis not present

## 2016-09-23 DIAGNOSIS — IMO0001 Reserved for inherently not codable concepts without codable children: Secondary | ICD-10-CM

## 2016-09-23 DIAGNOSIS — E669 Obesity, unspecified: Secondary | ICD-10-CM | POA: Diagnosis not present

## 2016-09-23 NOTE — Patient Instructions (Signed)
  1. Continue meal replacements but supplement with vegetables. Minimum of one meal. 2. Low glycemic fruit: pears, strawberries, cantaloupe (avoid tropical fruits) 3. Continue water exercise 3 X wk and your KB workout at home 2 X wk.

## 2016-09-23 NOTE — Progress Notes (Signed)
Medical Nutrition Therapy:  Appt start time: 1000 end time:  1100.  Assessment:  Primary concerns today: Weight management and Blood sugar control.    Patient has not been doing well with her nutritional goals set at last visit. She states that her back pain and her work schedule are limiting her from preparing meals. She states that when she gets home from work she just wants to lay down and go to sleep because the pain is so bad.   Because she is unable to prepare meals patient has been drinking meal replacements. She admits to not wanting/avoiding eating regular food because she gains weight.  Valerie Carter is starting the process for candidacy for bariatric surgery.  She is still very reluctant to pursue surgery, but is at least open to considering it. She continues to lose gradual weight.   Has not been exercising like usual. Has been feeling unmotivated lately due to pain. She would like to start back doing water aerobics as this does help the pain.   24-hr recall:  (Up at 4:15 AM) B (5:30 AM)- Equate shake vanilla 13g protein (160 kcal) Snk ( 11 AM)- veggie sub 6in on honey wheat bread, water L (4PM)- Equate shake chocolate 8oz D (730 PM)- baked [place] wing 6-8oz , water Typical day? No.  Progress Towards Goal(s):  In progress.  Goals set today: 1. Continue meal replacements but supplement with vegetables. Minimum of one meal. 2. Low glycemic fruit: pears, strawberries, cantaloupe (avoid tropical fruits) 3. Continue water exercise 3 X wk and your KB workout at home 2 X wk.     Nutritional Diagnosis:  Slight progress on [place]-3.3 Overweight/obesity As related to energy imbalance.  As evidenced by weight loss.    Intervention:  Nutrition education.  Handouts given during visit include:  AVS  Demonstrated degree of understanding via:  Teach Back   Monitoring/Evaluation:  Dietary intake, exercise, BG, and body weight in 4 week(s)    Luiz Blare, DO 09/23/2016, 10:04 AM PGY-3,  [place]

## 2016-09-26 ENCOUNTER — Ambulatory Visit
Admission: RE | Admit: 2016-09-26 | Discharge: 2016-09-26 | Disposition: A | Discharge status: Home / Self Care | Payer: BLUE CROSS/BLUE SHIELD | Source: Ambulatory Visit | Attending: Family Medicine | Admitting: Family Medicine

## 2016-09-26 DIAGNOSIS — M48061 Spinal stenosis, lumbar region without neurogenic claudication: Secondary | ICD-10-CM | POA: Diagnosis not present

## 2016-09-26 DIAGNOSIS — M5442 Lumbago with sciatica, left side: Principal | ICD-10-CM

## 2016-09-26 DIAGNOSIS — M5441 Lumbago with sciatica, right side: Secondary | ICD-10-CM

## 2016-09-30 ENCOUNTER — Telehealth: Payer: Self-pay

## 2016-09-30 NOTE — Telephone Encounter (Signed)
Hasn't heard anything from referral. Pt would like to see someone this week while she is on Spring Break. I told her I wasn't sure if this is possible that some times it takes a couple of weeks to get an appt. Pt asked me to ask Dr. Gerarda Fraction if she can do anything to help her out. Please call pt at [phone]. Ottis Stain, CMA

## 2016-09-30 NOTE — Telephone Encounter (Signed)
Spoke with patient and informed her that after speaking with [place] neuro we would have to refax the referral as they said it was never received.  I also spoke with our referral coordinator who checked with them on Monday and was informed that it was received.  Tia will call them back to check on this and then we will call patient back and give her an update.Johnney Ou

## 2016-10-06 ENCOUNTER — Telehealth: Payer: Self-pay | Admitting: Obstetrics and Gynecology

## 2016-10-06 NOTE — Telephone Encounter (Signed)
Pt called because she was referred to a back specialist. She went to the appointment but all they wanted to do was surgery and she doesn't want that to happen. That doctor referred her to a pain management , but they do not have anything open until May 6th. She said that this pain is unbearable and needs to be seen somewhere right away. Please call her with an update. Valerie Carter

## 2016-10-07 ENCOUNTER — Other Ambulatory Visit: Payer: Self-pay | Admitting: Obstetrics and Gynecology

## 2016-10-07 MED ORDER — HYDROCODONE-ACETAMINOPHEN 10-325 MG PO TABS: 1.0000 | ORAL_TABLET | Freq: Three times a day (TID) | ORAL | 0 refills | Status: DC | PRN

## 2016-10-12 NOTE — Telephone Encounter (Signed)
Spoke with patient and confirmed her appointment.  She is already on the surgeon's cancellation list if they have any sooner appointments. Keaten Mashek,CMA

## 2016-10-12 NOTE — Telephone Encounter (Signed)
Already had original phone conversation with patient about her MRI results and need for spine specialist to help with her chronic and worsening back pain. Pathology was found on imaging. Rx was given for pain medication. I had encouraged patient to keep appointment with back surgeon to hear her options even though she is scared of surgery. Pain clinic referral was also placed for her.  Please call patient and inform her that I called the back surgeon office [facility]) and confirmed that she still has an appointment on 11/10/2016 at 10:30AM.

## 2016-10-26 ENCOUNTER — Ambulatory Visit: Payer: BLUE CROSS/BLUE SHIELD | Admitting: Family Medicine

## 2016-11-10 ENCOUNTER — Encounter: Payer: Self-pay | Admitting: Gynecology

## 2016-11-10 DIAGNOSIS — M48061 Spinal stenosis, lumbar region without neurogenic claudication: Secondary | ICD-10-CM | POA: Diagnosis not present

## 2016-11-10 DIAGNOSIS — M7138 Other bursal cyst, other site: Secondary | ICD-10-CM | POA: Diagnosis not present

## 2016-11-10 DIAGNOSIS — M5416 Radiculopathy, lumbar region: Secondary | ICD-10-CM | POA: Diagnosis not present

## 2016-11-12 DIAGNOSIS — M5416 Radiculopathy, lumbar region: Secondary | ICD-10-CM | POA: Diagnosis not present

## 2016-11-12 DIAGNOSIS — M48061 Spinal stenosis, lumbar region without neurogenic claudication: Secondary | ICD-10-CM | POA: Diagnosis not present

## 2016-11-12 DIAGNOSIS — M7138 Other bursal cyst, other site: Secondary | ICD-10-CM | POA: Diagnosis not present

## 2016-11-16 ENCOUNTER — Ambulatory Visit: Payer: BLUE CROSS/BLUE SHIELD | Admitting: Family Medicine

## 2016-12-02 ENCOUNTER — Other Ambulatory Visit: Payer: Self-pay | Admitting: Obstetrics and Gynecology

## 2016-12-10 DIAGNOSIS — M7138 Other bursal cyst, other site: Secondary | ICD-10-CM | POA: Diagnosis not present

## 2016-12-10 DIAGNOSIS — M48061 Spinal stenosis, lumbar region without neurogenic claudication: Secondary | ICD-10-CM | POA: Diagnosis not present

## 2016-12-14 ENCOUNTER — Encounter: Payer: Self-pay | Admitting: Internal Medicine

## 2016-12-14 ENCOUNTER — Ambulatory Visit (INDEPENDENT_AMBULATORY_CARE_PROVIDER_SITE_OTHER): Payer: BLUE CROSS/BLUE SHIELD | Admitting: Internal Medicine

## 2016-12-14 VITALS — BP 126/64 | HR 89 | Temp 99.0°F | Ht 65.0 in | Wt 354.0 lb

## 2016-12-14 DIAGNOSIS — R399 Unspecified symptoms and signs involving the genitourinary system: Secondary | ICD-10-CM | POA: Diagnosis not present

## 2016-12-14 LAB — POCT UA - MICROSCOPIC ONLY

## 2016-12-14 LAB — POCT URINALYSIS DIP (MANUAL ENTRY)
BILIRUBIN UA: NEGATIVE
Glucose, UA: NEGATIVE mg/dL
Ketones, POC UA: NEGATIVE mg/dL
NITRITE UA: NEGATIVE
PH UA: 6.5 (ref 5.0–8.0)
Protein Ur, POC: NEGATIVE mg/dL
Spec Grav, UA: 1.02 (ref 1.010–1.025)
UROBILINOGEN UA: 0.2 U/dL

## 2016-12-14 MED ORDER — CEPHALEXIN 500 MG PO CAPS: 500.0000 mg | ORAL_CAPSULE | Freq: Two times a day (BID) | ORAL | 0 refills | Status: DC

## 2016-12-14 MED ORDER — PHENAZOPYRIDINE HCL 100 MG PO TABS: 100.0000 mg | ORAL_TABLET | Freq: Three times a day (TID) | ORAL | 0 refills | Status: DC | PRN

## 2016-12-14 NOTE — Patient Instructions (Addendum)
I am going to prescribe you with antibiotics - Keflex 500 mg BID. You should see improvement in the next 2-3 days. You can take the pyridium for pain.

## 2016-12-14 NOTE — Progress Notes (Signed)
   [facility] Kerrin Mo, MD Phone: [phone]  Reason For Visit: SDA for Dysuria   DYSURIA  Patient with menstrual period started on Friday. Patient has been seen by OBGYN, received an ultrasound to look at her uterus. Patient recently received an endometrial biopsy in 2017 which did not any concern for dyplasia.  Pain urinating on Saturday, every time patient has urinate she has pain and pressure. It hurts when she has to empty. Increased frequency, and urgency  Medications tried: took some Azo Any antibiotics in the last 30 days: none  More than 3 UTIs in the last 12 months: none  STD exposure: Not sexually active in the last 11-12 years  Possibly pregnant: not likely, going through menopause   Symptoms Urgency: yes Frequency: yes  Blood in urine: none  Pain in back: no, chronic pain  Fever: None  Vaginal discharge:None   Review of Symptoms - see HPI PMH - Smoking status noted.   Social history- patient is a non- smoker  Objective: BP 126/64   Pulse 89   Temp 99 F (37.2 C) (Oral)   Ht 5\' 5"  (1.651 m)   Wt (!) 354 lb (160.6 kg)   LMP 12/10/2016   SpO2 99%   BMI 58.91 kg/m  Gen: NAD, alert, cooperative with exam Cardio: regular rate and rhythm, S1S2 heard, no murmurs appreciated Pulm: clear to auscultation bilaterally, no wheezes, rhonchi or rales GI: soft, non-tender, non-distended, bowel sounds present, no hepatomegaly, no splenomegaly, no CVA tenderness     Assessment/Plan: See problem based a/p  UTI symptoms - Keflex 500 mg BID for 7 days  - POCT urinalysis dipstick - POCT UA - Microscopic Only - phenazopyridine (PYRIDIUM) 100 MG tablet; Take 1 tablet (100 mg total) by mouth 3 (three) times daily as needed for pain.  Dispense: 10 tablet; Refill: 0 - Urine Culture

## 2016-12-16 LAB — URINE CULTURE

## 2016-12-16 NOTE — Assessment & Plan Note (Addendum)
-   Keflex 500 mg BID for 7 days  - POCT urinalysis dipstick - POCT UA - Microscopic Only - phenazopyridine (PYRIDIUM) 100 MG tablet; Take 1 tablet (100 mg total) by mouth 3 (three) times daily as needed for pain.  Dispense: 10 tablet; Refill: 0 - Urine Culture

## 2016-12-17 ENCOUNTER — Encounter: Payer: Self-pay | Admitting: Family Medicine

## 2016-12-17 ENCOUNTER — Ambulatory Visit (INDEPENDENT_AMBULATORY_CARE_PROVIDER_SITE_OTHER): Payer: BLUE CROSS/BLUE SHIELD | Admitting: Family Medicine

## 2016-12-17 VITALS — BP 142/88 | Ht 65.0 in | Wt 366.0 lb

## 2016-12-17 DIAGNOSIS — Z6841 Body Mass Index (BMI) 40.0 and over, adult: Secondary | ICD-10-CM | POA: Diagnosis not present

## 2016-12-17 DIAGNOSIS — E119 Type 2 diabetes mellitus without complications: Secondary | ICD-10-CM

## 2016-12-17 DIAGNOSIS — E669 Obesity, unspecified: Secondary | ICD-10-CM

## 2016-12-17 DIAGNOSIS — M170 Bilateral primary osteoarthritis of knee: Secondary | ICD-10-CM

## 2016-12-17 DIAGNOSIS — IMO0001 Reserved for inherently not codable concepts without codable children: Secondary | ICD-10-CM

## 2016-12-17 MED ORDER — METHYLPREDNISOLONE ACETATE 40 MG/ML IJ SUSP
40.0000 mg | Freq: Once | INTRAMUSCULAR | Status: AC
  Administered 2016-12-17: 40 mg via INTRA_ARTICULAR

## 2016-12-17 NOTE — Progress Notes (Signed)
[place] Attending Note: I have seen and examined this patient. I have discussed this patient with the resident and reviewed the assessment and plan as documented above. I agree with the resident's findings and plan. Bilateral corticosteroid knee injections today. She's lost 20 some pounds and she is congratulated on that. She is Re: Started the process for bariatric surgery, specifically the gastric sleeve. I encouraged her to go ahead and get the evaluation as there is a lot of resources, that such as classes etc. She's aware of the impact of her weight upon her knee arthritis. She's not getting more than about 4-8 weeks of good knee pain improvement with the injection so unfortunately I think we are nearing the point where she may have to consider bilateral TKR is certainly at this BMI, she would not be a candidate. I'll see her back when necessary

## 2016-12-17 NOTE — Progress Notes (Signed)
Medical Nutrition Therapy:  Appt start time: 1330 end time:  1430.  Assessment:  Primary concerns today: Weight management and Blood sugar control.  Valerie Carter has been staying on track with respect to food choices.  Valerie Carter has not been able to do water exercise recently b/c of back pain (and cancelled water ex classes d/t no lifeguard).  Last Friday Valerie Carter had another lumbar puncture, which has not provided as much pain relief as before.  Valerie Carter also got bilateral knee injections this morning.  Plans to start water exercise next Tuesday.    Valerie Carter's 16-YO niece will not be helping her with food prep b/c Valerie Carter has a job over the summer, and is working quite a number of hours.  Also from last appt:  Valerie Carter did not make a meals list, but agreed to do so today.  I believe this will help her keep the right foods on hand to allow good meal options for her most days.    Discussed missing lunch yesterday, and on other days.  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   24-hr recall:  (Up at 4:30 AM) B (5:15 AM)-  3-egg omelette, spin, mushrm, peppers, onion, 4 oz coffee, 1 T flvrd creamer, water Snk (10 AM)-  6 oz blueberry yog, 32 oz water L ( PM)-  --- [place] asleep 12:30-3:30 Snk ( PM)-  48 oz water D (8 PM)-  6 oz breaded, fried trout, 10 Fr fries, water Snk ( PM)-  --- Typical day? Yes.  Breakfast is sometimes a smoothie with fruit (1/2 banana, 10 blueberries, spinach, almond milk) with 1/2 avocado on side.   Progress Towards Goal(s):  In progress.   Nutritional Diagnosis:  Continued progress on [name] Overweight/obesity As related to energy imbalance.  As evidenced by weight loss of nearly 8 lb since March.    Intervention:  Nutrition education.  Handouts given during visit include:  AVS  Demonstrated degree of understanding via:   Teach Back   Monitoring/Evaluation:  Dietary intake, exercise, BG, and body weight in 6 week(s) .

## 2016-12-17 NOTE — Patient Instructions (Addendum)
Goals:   1.  Set aside some time for weekly planning for meals and snacks (which will help you know what foods you need to buy), and some preparation time.   2. Eat at least 3 REAL meals and 1-2 snacks per day.  Aim for no more than 5 hours between eating.  Eat breakfast within one hour of getting up.  A REAL meal includes at least some protein, some starch, and vegetables and/or fruit.   3. At least 60 min of water exercise 3 X wk.    - Complete your Goals Sheet, and bring to follow-up appt.    ALSO:  Email Jeannie no later than 6/29 with a progress report, including: Have you documented daily? Which goals have you done especially well with, and which need more work?  AND:  If there are some goals you are struggling with, what might you be able to put in place to be more successful?  - (From last visit:)  Make a list of 7-10 dinner meals that taste good, are relatively quick and easy to prepare, and that meet your nutritional needs.  Use this as a basis for shopping, so you can make one of these meals any time.  Bring your list to your follow-up appointment for review.    - Canned beans with no salt added: [employer] is one of the least expensive places for this product.    - And:  Try [employer] for less expensive produce.

## 2016-12-17 NOTE — Progress Notes (Signed)
   Subjective:    Patient ID: Valerie Carter, female    DOB: 1960/08/31, 56 y.o.   MRN: [id number]  HPI Mrs. Benham is a 56 year old female presenting today for bilateral knee pain. Notes long history of bilateral knee pain. Currently her right knee is more painful than her left. Has been receiving injections off and on for the last 3-4 years. Last injection March 2018. Reports last injection lasted 3-4 weeks. Denies giving out. Reports 1-2 episodes of locking. Denies edema. Was taking Motrin but this was stopped by her PCP. Has been working on losing weight and reports she has lost almost 10 pounds. Interested in following up with bariatrics and has been referred by her PCP.  Review of Systems Per HPI    Objective:   Physical Exam  Constitutional: She appears well-developed and well-nourished. No distress.  Cardiovascular: Normal rate.   Pulmonary/Chest: Effort normal. No respiratory distress.  Musculoskeletal:  Knees: Range of motion symmetric bilaterally. No effusion noted. No joint line tenderness noted. No patellar or quad tendon tenderness noted. Anterior cruciate ligament, PCL, LCL, MCL intact and symmetric. Negative Thessaly's.      Assessment & Plan:  1. Primary osteoarthritis of both knees Bilateral knee injections given. See procedure note below. Discussed that she may be nearing knee replacement. Continue to work on weight loss with diet and exercise. Plans to follow bariatrics. Consider orthopedics referral at next office visit if no improvement. Follow-up as needed.  PROCEDURE NOTE: Informed consent obtained after discussion of risk and benefits. Timeout taken. Area prepped and draped in usual sterile fashion. 1cc of methylprednisolone 40 mg/mL with 4cc of 1% lidocaine without epinephrine was injected into the bilateral knees using an anterior medial approach. The patient tolerated the procedure well. There was no complications noted. Postprocedure instructions given.

## 2017-01-10 DIAGNOSIS — M7138 Other bursal cyst, other site: Secondary | ICD-10-CM | POA: Diagnosis not present

## 2017-01-10 DIAGNOSIS — M48061 Spinal stenosis, lumbar region without neurogenic claudication: Secondary | ICD-10-CM | POA: Diagnosis not present

## 2017-01-10 DIAGNOSIS — Z6841 Body Mass Index (BMI) 40.0 and over, adult: Secondary | ICD-10-CM | POA: Diagnosis not present

## 2017-01-10 DIAGNOSIS — I10 Essential (primary) hypertension: Secondary | ICD-10-CM | POA: Diagnosis not present

## 2017-01-24 ENCOUNTER — Ambulatory Visit: Payer: BLUE CROSS/BLUE SHIELD | Admitting: Family Medicine

## 2017-01-28 ENCOUNTER — Encounter: Payer: Self-pay | Admitting: Family Medicine

## 2017-01-28 ENCOUNTER — Ambulatory Visit (INDEPENDENT_AMBULATORY_CARE_PROVIDER_SITE_OTHER): Payer: BLUE CROSS/BLUE SHIELD | Admitting: Family Medicine

## 2017-01-28 VITALS — BP 130/80 | HR 111 | Temp 97.8°F | Wt 359.0 lb

## 2017-01-28 DIAGNOSIS — R399 Unspecified symptoms and signs involving the genitourinary system: Secondary | ICD-10-CM | POA: Diagnosis not present

## 2017-01-28 DIAGNOSIS — E119 Type 2 diabetes mellitus without complications: Secondary | ICD-10-CM | POA: Diagnosis not present

## 2017-01-28 LAB — POCT URINALYSIS DIP (MANUAL ENTRY)
BILIRUBIN UA: NEGATIVE mg/dL
Bilirubin, UA: NEGATIVE
Glucose, UA: NEGATIVE mg/dL
Nitrite, UA: NEGATIVE
PH UA: 7 (ref 5.0–8.0)
PROTEIN UA: NEGATIVE mg/dL
SPEC GRAV UA: 1.015 (ref 1.010–1.025)
UROBILINOGEN UA: 1 U/dL

## 2017-01-28 LAB — POCT GLYCOSYLATED HEMOGLOBIN (HGB A1C): Hemoglobin A1C: 9.6

## 2017-01-28 NOTE — Assessment & Plan Note (Signed)
UA without nitrites, but similar findings as last UA, where culture grew E coli. Patient responded well to Keflex last month. Will repeat Keflex. With her history of recurrent UTIs, would consider prophylaxis in the future if continued. RTC if worsening or not improving with antibiotics.

## 2017-01-28 NOTE — Progress Notes (Signed)
   CC: concern for UTI  HPI Urinary Tract Infection: Patient complains of burning with urination and dysuria. Cloudy urine, started about 7/31 or 8/1. Gets UTIs somewhat often, thinks they are worse after her period. No fever, chills. Some suprapubic pain. No discharge. Patient does have a history of recurrent UTI.  Patient does not have a history of pyelonephritis. Thinks she has about 4 UTIs per year. Reports that she does wipe herself from front to back.   CC, SH/smoking status, and VS noted  Objective: BP 130/80   Pulse (!) 111   Temp 97.8 F (36.6 C) (Oral)   Wt (!) 359 lb (162.8 kg)   LMP 01/26/2017   SpO2 99%   BMI 59.74 kg/m  Gen: NAD, alert, cooperative, and pleasant obese female.  HEENT: NCAT, EOMI, PERRL CV: RRR, no murmur Resp: CTAB, no wheezes, non-labored Abd: SNTND, BS present, no guarding or organomegaly Ext: No edema, warm Neuro: Alert and oriented, Speech clear, No gross deficits  Assessment and plan:  UTI symptoms UA without nitrites, but similar findings as last UA, where culture grew E coli. Patient responded well to Keflex last month. Will repeat Keflex. With her history of recurrent UTIs, would consider prophylaxis in the future if continued. RTC if worsening or not improving with antibiotics.   Diabetes mellitus, stable ([place]) Patient seen for UTI same day, but Hgba1c checked. Asked patient to schedule with PCP this month to discuss. Patient thinks her A1c will be higher today than previous.    Orders Placed This Encounter  Procedures  . Urine Culture  . POCT urinalysis dipstick  . HgB A1c  . POCT glycosylated hemoglobin (Hb A1C)    No orders of the defined types were placed in this encounter.  Health Maintenance reviewed - patient asked to schedule her pap smear, patient asked to schedule her mammogram.  Ralene Ok, MD, PGY2 01/28/2017 9:38 AM

## 2017-01-28 NOTE — Patient Instructions (Signed)
It was a pleasure to see you today! Thank you for choosing [facility] for your primary care. Valerie Carter was seen for UTI.   Our plans for today were:  I sent your antibiotic to the pharmacy.  You need to see Dr. Enid Derry about your diabetes.   To keep you healthy, we need to monitor some screening tests. You are due for your mammogram and colonoscopy. Please schedule these.   You should return to our clinic to see Dr. Enid Derry in 1 month for diabetes.   Best,  Dr. Lindell Noe

## 2017-01-28 NOTE — Assessment & Plan Note (Signed)
Patient seen for UTI same day, but Hgba1c checked. Asked patient to schedule with PCP this month to discuss. Patient thinks her A1c will be higher today than previous.

## 2017-01-31 ENCOUNTER — Other Ambulatory Visit: Payer: Self-pay | Admitting: Family Medicine

## 2017-01-31 LAB — URINE CULTURE

## 2017-01-31 NOTE — Telephone Encounter (Signed)
Pt called to see if we had sent her UTI medication. She was seen on 01/28/17 but nothing was called in. [facility] doesn't want to wait much longer. Can we call this in. jw

## 2017-01-31 NOTE — Telephone Encounter (Signed)
Pt was seen on the 3rd and stated she was supposed to have something for a UTI called into [facility] on [address], but they never received anything. ep

## 2017-02-01 ENCOUNTER — Ambulatory Visit: Payer: BLUE CROSS/BLUE SHIELD | Admitting: Family Medicine

## 2017-02-01 MED ORDER — CEPHALEXIN 500 MG PO CAPS: 500.0000 mg | ORAL_CAPSULE | Freq: Two times a day (BID) | ORAL | 0 refills | Status: DC

## 2017-02-01 NOTE — Telephone Encounter (Signed)
Pt is now peeing blood. Pt has not received antibiotic and is very frustrated. Pt uses [facility] and needs this called in today. ep

## 2017-02-01 NOTE — Telephone Encounter (Signed)
Medication sent to [facility].  Daxtin Leiker L, RN  

## 2017-02-01 NOTE — Telephone Encounter (Signed)
Will send to Dr. Lindell Noe who saw patient on 01/28/17.  Derl Barrow, RN

## 2017-02-08 ENCOUNTER — Ambulatory Visit (INDEPENDENT_AMBULATORY_CARE_PROVIDER_SITE_OTHER): Payer: BLUE CROSS/BLUE SHIELD | Admitting: Family Medicine

## 2017-02-08 ENCOUNTER — Encounter: Payer: Self-pay | Admitting: Internal Medicine

## 2017-02-08 ENCOUNTER — Encounter: Payer: Self-pay | Admitting: Family Medicine

## 2017-02-08 ENCOUNTER — Ambulatory Visit (INDEPENDENT_AMBULATORY_CARE_PROVIDER_SITE_OTHER): Payer: BLUE CROSS/BLUE SHIELD | Admitting: Internal Medicine

## 2017-02-08 VITALS — BP 122/82 | HR 92 | Temp 98.8°F | Ht 65.0 in | Wt 357.0 lb

## 2017-02-08 DIAGNOSIS — I10 Essential (primary) hypertension: Secondary | ICD-10-CM | POA: Diagnosis not present

## 2017-02-08 DIAGNOSIS — E785 Hyperlipidemia, unspecified: Secondary | ICD-10-CM

## 2017-02-08 DIAGNOSIS — Z6841 Body Mass Index (BMI) 40.0 and over, adult: Secondary | ICD-10-CM

## 2017-02-08 DIAGNOSIS — E119 Type 2 diabetes mellitus without complications: Secondary | ICD-10-CM

## 2017-02-08 DIAGNOSIS — E669 Obesity, unspecified: Secondary | ICD-10-CM

## 2017-02-08 DIAGNOSIS — IMO0001 Reserved for inherently not codable concepts without codable children: Secondary | ICD-10-CM

## 2017-02-08 MED ORDER — EMPAGLIFLOZIN 10 MG PO TABS: 10.0000 mg | ORAL_TABLET | Freq: Every day | ORAL | 0 refills | Status: DC

## 2017-02-08 NOTE — Patient Instructions (Addendum)
It was so nice to meet you!  We will recheck your cholesterol today and I will call you with those results. You are doing a great job with eating healthier!  I have changed your diabetes medications. Please STOP taking the Glipizide. I have started a new medication called Jardiance. You should take 1 tablet once a day. Please continue taking the Metformin twice a day.  We will see you back in 3 months to recheck your A1c.  -Dr. Brett Albino

## 2017-02-08 NOTE — Patient Instructions (Addendum)
-   Complete your paperwork for bariatric surgery, and submit.    - Fruit you do not have to worry about eating as long as it's a "normal" portion size: ANY berries, apples, peaches, pears, cantaloupe, plums.    Goals remain the same: 1. Set aside some time for weekly planning for meals and snacks (which will help you know what foods you need to buy), and some preparation time.   2. Eat at least 3 REAL meals and 1-2 snacks per day.  Aim for no more than 5 hours between eating.  Eat breakfast within one hour of getting up. 3. At least 60 min of water exercise 3 X wk.    Email Edmonia Lynch a progress report every two weeks beginning the week of Aug 26.  Complete Goals Sheet, and bring to follow-up appt.

## 2017-02-08 NOTE — Progress Notes (Signed)
Medical Nutrition Therapy:  Appt start time: 0900 end time:  1000.  Assessment:  Primary concerns today: Weight management and Blood sugar control.  Valerie Carter has continued to have knee and back pain, which has limited exercise.  She has restarted taking metformin and glipizide as instructed, after having taken them only ~2 X wk for the previous few months, which explains the A1C of 9.6 on 8/3.  [name] said she has just not done any of the goals previously agreed to; has been extremely busy taking care of her mother and aunt (as well as other family members) including running errands and cooking for them, driving to dr's appts, etc.   We talked again about bariatric surgery.  [name] has completed paper work for this, but has not submitted it.  She remains  fearful of surgery and anesthesia.  I assured her that submitting the paperwork does not commit her to the surgery, but just starts the process if she decides to go through with it.  Also reminded her of the ongoing difficulties she is having with back and knees, as well as maintaining glycemic control, all of which would likely be helped by weight loss.  (She does not want to have to take insulin injections.)    24-hr recall suggests intake of 770 kcal:  (Up at 6 AM) B (9:30 AM)-  1 Equate meal replacement shake, water 190  Snk ( AM)-  --- L (2:30 PM)-  8 [employer] nuggets, water   260 Snk ( PM)-  --- D (8 PM)-  1 hamburger patty, 1 slc chs, water  320 Snk ( PM)-  --- Typical day? Yes.    Progress Towards Goal(s):  In progress.   Nutritional Diagnosis:  No progress on [name]-3.3 Overweight/obesity As related to energy imbalance.  As evidenced by no weight loss in recent months, and inattention to behavioral goals.    Intervention:  Nutrition education.  Handouts given during visit include:  AVS  Demonstrated degree of understanding via:  Teach Back   Monitoring/Evaluation:  Dietary intake, exercise, BG, and body weight in 9 week(s) .

## 2017-02-09 ENCOUNTER — Telehealth: Payer: Self-pay | Admitting: Internal Medicine

## 2017-02-09 ENCOUNTER — Telehealth: Payer: Self-pay | Admitting: *Deleted

## 2017-02-09 LAB — LIPID PANEL
CHOLESTEROL TOTAL: 167 mg/dL (ref 100–199)
Chol/HDL Ratio: 3.5 ratio (ref 0.0–4.4)
HDL: 48 mg/dL (ref >39)
LDL Calculated: 108 mg/dL — ABNORMAL HIGH (ref 0–99)
Triglycerides: 54 mg/dL (ref 0–149)
VLDL Cholesterol Cal: 11 mg/dL (ref 5–40)

## 2017-02-09 NOTE — Telephone Encounter (Signed)
Called patient to discuss her lab results. Left voicemail asking her to call us back.

## 2017-02-09 NOTE — Telephone Encounter (Signed)
Patient left voice message on nurse line stating she was returning a call from Mr. Brett Albino. Please give her a call at [phone].  Derl Barrow, RN

## 2017-02-10 ENCOUNTER — Telehealth: Payer: Self-pay | Admitting: Internal Medicine

## 2017-02-10 MED ORDER — ATORVASTATIN CALCIUM 40 MG PO TABS: 40.0000 mg | ORAL_TABLET | Freq: Every day | ORAL | 0 refills | Status: DC

## 2017-02-10 NOTE — Assessment & Plan Note (Signed)
Previously taking Lipitor, but stopped taking this because she does not like taking medications. States she wants to see if her cholesterol has changed with her dietary changes. Last LDL in 2017 was 105. - Will check lipid panel - Discussed that all patients with diabetes benefit from a statin, regardless of what their LDL is. Patient still would like to wait for lab results. - Will call patient to discuss results, but will recommend that patient restart Lipitor 40mg  daily - Follow-up with PCP

## 2017-02-10 NOTE — Telephone Encounter (Signed)
Called Ms. Harrel over the phone to discuss her lab results. Her lipid panel was significant for an LDL of 108. At her clinic visit on 8/13, I recommended that she restart her Lipitor due to her diagnosis of diabetes. She requested that we recheck her lipid panel to see if it had changed. Unfortunately, her LDL was still elevated. Her ASCVD risk score was 9.9%, so I did recommend that she restart her Lipitor. Patient stated that she would restart it.  Hyman Bible, MD

## 2017-02-10 NOTE — Assessment & Plan Note (Signed)
Uncontrolled and worsening. Last A1c 06/2016 was 7.9%. Repeat A1c today was 9.6%. Patient stopped taking Metformin, but restarted it 1 week ago. - Continue Metformin 1000mg  bid - Stop Glipizide and change to Jardiance 10mg  daily - Follow-up with PCP in 3 months to recheck A1c

## 2017-02-10 NOTE — Telephone Encounter (Signed)
Returned call. See telephone encounter.

## 2017-02-10 NOTE — Progress Notes (Signed)
   [facility] Phone: [phone]  Subjective:  Valerie Carter is a 56 year old female presenting to clinic for follow-up of HTN, T2DM, and HLD.  HTN: Doing well. Does not check BPs at home. No side effects to meds. No chest pain, no SOB, no LE edema.  T2DM: Checking blood sugars daily. CBGs have been in the 200s. States she has been working on her nutrition and trying to cut out sweets. Sees Dr. Jenne Campus. No low blood sugars. Endorses polyuria and polydipsia. States she has been trying to drink plenty of water. She stopped taking her Metformin for a couple of months because she ran out of the prescription. She restarted the Metformin 1 week ago. No diarrhea. She has been taking the Glipizide as prescribed.   HLD: Has taken Lipitor and Crestor in the past. Stopped taking these because she does not like taking medications. Did not have any muscle pain or RUQ pain when taking the statins. Tolerated it fine. States she does not want to restart a statin. She has stopped eating bacon, sausage, and fast food, so she thinks this has helped her cholesterol.   ROS: See HPI for pertinent positives and negatives  Past Medical History- hypertension, seasonal allergies, OSA, type 2 diabetes, hyperlipidemia, obesity, iron deficiency anemia  Family history reviewed for today's visit. No changes.  Social history- patient is a never smoker  Objective: BP 122/82   Pulse 92   Temp 98.8 F (37.1 C) (Oral)   Ht 5\' 5"  (1.651 m)   Wt (!) 357 lb (161.9 kg)   LMP 01/26/2017   SpO2 99%   BMI 59.41 kg/m  Gen: NAD, alert, cooperative with exam HEENT: NCAT, EOMI, MMM Neck: FROM, supple CV: RRR, no murmur Resp: CTABL, no wheezes, normal work of breathing Msk: No edema  Assessment/Plan: HTN: Well-controlled. BP 122/82 today. - Continue Benazepril 40mg  daily, Norvasc 5mg  qhs, HCTZ 25mg  daily - Follow-up with PCP in 3 months  T2DM: Uncontrolled and worsening. Last A1c 06/2016 was 7.9%.  Repeat A1c today was 9.6%. Patient stopped taking Metformin, but restarted it 1 week ago. - Continue Metformin 1000mg  bid - Stop Glipizide and change to Jardiance 10mg  daily - Follow-up with PCP in 3 months to recheck A1c  HLD: Previously taking Lipitor, but stopped taking this because she does not like taking medications. States she wants to see if her cholesterol has changed with her dietary changes. Last LDL in 2017 was 105. - Will check lipid panel - Discussed that all patients with diabetes benefit from a statin, regardless of what their LDL is. Patient still would like to wait for lab results. - Will call patient to discuss results, but will recommend that patient restart Lipitor 40mg  daily - Follow-up with PCP   Hyman Bible, MD PGY-3

## 2017-02-10 NOTE — Assessment & Plan Note (Signed)
Well-controlled. BP 122/82 today. - Continue Benazepril 40mg  daily, Norvasc 5mg  qhs, HCTZ 25mg  daily - Follow-up with PCP in 3 months

## 2017-02-11 ENCOUNTER — Other Ambulatory Visit (HOSPITAL_COMMUNITY): Payer: Self-pay | Admitting: General Surgery

## 2017-02-11 DIAGNOSIS — E785 Hyperlipidemia, unspecified: Secondary | ICD-10-CM | POA: Diagnosis not present

## 2017-02-11 DIAGNOSIS — E1169 Type 2 diabetes mellitus with other specified complication: Secondary | ICD-10-CM | POA: Diagnosis not present

## 2017-02-11 DIAGNOSIS — I10 Essential (primary) hypertension: Secondary | ICD-10-CM | POA: Diagnosis not present

## 2017-02-11 DIAGNOSIS — E119 Type 2 diabetes mellitus without complications: Secondary | ICD-10-CM | POA: Diagnosis not present

## 2017-02-18 DIAGNOSIS — M5136 Other intervertebral disc degeneration, lumbar region: Secondary | ICD-10-CM | POA: Diagnosis not present

## 2017-02-18 DIAGNOSIS — M48061 Spinal stenosis, lumbar region without neurogenic claudication: Secondary | ICD-10-CM | POA: Diagnosis not present

## 2017-02-19 ENCOUNTER — Other Ambulatory Visit: Payer: Self-pay | Admitting: Obstetrics and Gynecology

## 2017-02-19 DIAGNOSIS — D509 Iron deficiency anemia, unspecified: Secondary | ICD-10-CM

## 2017-02-21 ENCOUNTER — Encounter: Payer: BLUE CROSS/BLUE SHIELD | Attending: General Surgery | Admitting: Registered"

## 2017-02-21 ENCOUNTER — Encounter: Payer: Self-pay | Admitting: Registered"

## 2017-02-21 DIAGNOSIS — E119 Type 2 diabetes mellitus without complications: Secondary | ICD-10-CM | POA: Diagnosis not present

## 2017-02-21 DIAGNOSIS — Z713 Dietary counseling and surveillance: Secondary | ICD-10-CM | POA: Insufficient documentation

## 2017-02-21 NOTE — Progress Notes (Signed)
Pre-Op Assessment Visit:  Pre-Operative Sleeve gastrectomy Surgery  Medical Nutrition Therapy:  Appt start time: 11:20 End time:  12:25  Patient was seen on 02/21/2017 for Pre-Operative Nutrition Assessment. Assessment and letter of approval faxed to [facility] Bariatric Surgery Program coordinator on 02/21/2017.   Pt expectation of surgery: to be healthy, improve quality of life, be in less pain, improve diabetes, reduce medications  Pt expectation of Dietitian: information    Start weight at [facility]: 360.8 BMI: 60.97   Pt states she has had diabetes 10-12 years. Pt states she checks BS 1x/day: FBS (90-120) pre-steroid injections and FBS (180-190) post-steroid injections. Pt states her last A1c was Aug (8.6) which has increased due to 3 months of steroid injections.  Per insurance, pt needs 0 SWL visits prior to surgery.  Pt states she has had SWL visits with PCP and they may count towards  SWL visits needed.    24 hr Dietary Recall: First Meal: 2 egg omelet, avocado or meal replacement shake Snack: meal replacement shake Second Meal: [employer] steak sub, fries Snack: none  Third Meal: apple Snack: none Beverages: water, coffee, green tea  Encouraged to engage in 150 minutes of moderate physical activity including cardiovascular and weight baring weekly  Handouts given during visit include:  . Pre-Op Goals . Bariatric Surgery Protein Shakes . Vitamin and Mineral Recommendations  During the appointment today the following Pre-Op Goals were reviewed with the patient: . Maintain or lose weight as instructed by your surgeon . Make healthy food choices . Begin to limit portion sizes . Limited concentrated sugars and fried foods . Keep fat/sugar in the single digits per serving on          food labels . Practice CHEWING your food  (aim for 30 chews per bite or until applesauce consistency) . Practice not drinking 15 minutes before, during, and 30 minutes after  each meal/snack . Avoid all carbonated beverages  . Avoid/limit caffeinated beverages  . Avoid all sugar-sweetened beverages . Consume 3 meals per day; eat every 3-5 hours . Make a list of non-food related activities . Aim for 64-100 ounces of FLUID daily  . Aim for at least 60-80 grams of PROTEIN daily . Look for a liquid protein source that contain ?15 g protein and ?5 g carbohydrate  (ex: shakes, drinks, shots) . Physical activity is an important part of a healthy lifestyle so keep it moving!  Follow diet recommendations listed below Energy and Macronutrient Recommendations: Calories: 1600 Carbohydrate: 180 Protein: 120 Fat: 44  Demonstrated degree of understanding via:  Teach Back   Teaching Method Utilized:  Visual Auditory Hands on  Barriers to learning/adherence to lifestyle change: none  Patient to call the [facility] to enroll in Pre-Op and Post-Op Nutrition Education when surgery date is scheduled.

## 2017-02-25 ENCOUNTER — Ambulatory Visit (HOSPITAL_COMMUNITY)
Admission: RE | Admit: 2017-02-25 | Discharge: 2017-02-25 | Disposition: A | Discharge status: Home / Self Care | Payer: BLUE CROSS/BLUE SHIELD | Source: Ambulatory Visit | Attending: General Surgery | Admitting: General Surgery

## 2017-02-25 ENCOUNTER — Other Ambulatory Visit: Payer: Self-pay

## 2017-02-25 DIAGNOSIS — Z01818 Encounter for other preprocedural examination: Secondary | ICD-10-CM | POA: Diagnosis not present

## 2017-02-25 DIAGNOSIS — Z01812 Encounter for preprocedural laboratory examination: Secondary | ICD-10-CM | POA: Diagnosis not present

## 2017-02-25 DIAGNOSIS — Z01810 Encounter for preprocedural cardiovascular examination: Secondary | ICD-10-CM | POA: Diagnosis not present

## 2017-02-26 DIAGNOSIS — F509 Eating disorder, unspecified: Secondary | ICD-10-CM | POA: Diagnosis not present

## 2017-03-21 ENCOUNTER — Encounter: Payer: BLUE CROSS/BLUE SHIELD | Attending: General Surgery | Admitting: Skilled Nursing Facility1

## 2017-03-21 DIAGNOSIS — E119 Type 2 diabetes mellitus without complications: Secondary | ICD-10-CM | POA: Diagnosis not present

## 2017-03-21 DIAGNOSIS — Z713 Dietary counseling and surveillance: Secondary | ICD-10-CM | POA: Diagnosis not present

## 2017-03-22 ENCOUNTER — Encounter: Payer: Self-pay | Admitting: Skilled Nursing Facility1

## 2017-03-22 NOTE — Progress Notes (Signed)
Pre-Operative Nutrition Class:  Appt start time: 5188   End time:  1830.  Patient was seen on 03/21/2017 for Pre-Operative Bariatric Surgery Education at [facility].   Surgery date:  Surgery type: sleeve Start weight at [facility]: 360 Weight today: 354.8  Samples given per MNT protocol. Patient educated on appropriate usage:  Bariatric Advantage Calcium  Lot # [id number] Exp: nov-07-2016  Celebrate Vitamins Multivitamin Lot # [id number] Exp: 05/2018  Renee Pain Protein Powder   Lot #[id number] Exp: 08/2017  The following the learning objectives were met by the patient during this course:  Identify Pre-Op Dietary Goals and will begin 2 weeks pre-operatively  Identify appropriate sources of fluids and proteins   State protein recommendations and appropriate sources pre and post-operatively  Identify Post-Operative Dietary Goals and will follow for 2 weeks post-operatively  Identify appropriate multivitamin and calcium sources  Describe the need for physical activity post-operatively and will follow MD recommendations  State when to call healthcare provider regarding medication questions or post-operative complications  Handouts given during class include:  Pre-Op Bariatric Surgery Diet Handout  Protein Shake Handout  Post-Op Bariatric Surgery Nutrition Handout  [facility] Program Information Flyer  Support Group Information Flyer  [facility] Bariatric Supplements Price List  Follow-Up Plan: Patient will follow-up at [facility] 2 weeks post operatively for diet advancement per MD.

## 2017-03-26 DIAGNOSIS — F509 Eating disorder, unspecified: Secondary | ICD-10-CM | POA: Diagnosis not present

## 2017-03-28 ENCOUNTER — Other Ambulatory Visit: Payer: Self-pay | Admitting: Obstetrics and Gynecology

## 2017-03-31 NOTE — Progress Notes (Signed)
Subjective:   Patient ID: Valerie Carter    DOB: February 23, 1961, 56 y.o. female   MRN: [id number]  Valerie Carter is a 56 y.o. female with a history of diabetes, morbid obesity here for   Type 2 Diabetes - Prescribed Jardiance at last visit but patient never picked up from pharmacy b/c she didn't like side effects she read. She is not interested in trying it.  - Last A1c 9.6 01/2017. Feels high A1c was due to steroid shots (knee injections, epidural injections) over the past few months and wants to give glipizide another chance. - CBGs at home: 160 this morning, checks in the mornigns before she eats, normally 89-110, said she had sweet tea at dinner last night ([employer], all you can eat shrimp) - Diet: seeing Dr. Jenne Campus, protein shakes, chicken broth, eating one meal per day for the last 2 weeks (baked chicken, salad, sweet potato, etc.) - taking Metformin as prescribed   Sciatic pain, chronic - Pain originating at lower back, radiates down legs with pins and needles sensation. Has had 3 epidural shots, Dr. Maryjean Ka follows her. - Motrin helps, tylenol doesn't. Tried gabapentin (didn't like the way it messed with her head). Lyrica too expensive. Dr. Maryjean Ka gave Nucynta 25mg  but was too strong, so she stopped taking it. - Wants to know if there is anything else we can give her. She was told by her surgeons not to take NSAIDs and wants something she can crush because after her bariatric surgery will have to take crushed medication.  Healthcare Maintenance - Vaccines: flu, pneumococcal - Colonoscopy: never had - Mammogram: 06/2014 - Eye Exam: 01/2015, has had diabetic eye exam 03/2016 - A1c: 9.6 01/2017 - Lipid Panel: LDL 108, prescribed Lipitor 40mg , but not taking - Getting pre op evaluation for bariatric surgery but no date set yet.  Review of Systems:  Per HPI.   [place]: reviewed. Smoking status reviewed. Medications reviewed.  Objective:   BP 120/80 (BP Location: Right Wrist,  Patient Position: Sitting, Cuff Size: Normal)   Pulse (!) 108   Temp 98.3 F (36.8 C) (Oral)   Ht 5' 4.5" (1.638 m)   Wt (!) 355 lb 12.8 oz (161.4 kg)   SpO2 98%   BMI 60.13 kg/m  Vitals and nursing note reviewed.  General: well nourished, well developed, in no acute distress with non-toxic appearance HEENT: normocephalic, atraumatic, moist mucous membranes Neck: supple, non-tender without lymphadenopathy CV: regular rate and rhythm without murmurs, rubs, or gallops, trace lower extremity edema Lungs: clear to auscultation bilaterally with normal work of breathing Skin: warm, dry, no rashes or lesions Extremities: warm and well perfused, normal tone MSK: Full ROM, strength intact, antalgic wide-stanced gait Neuro: Alert and oriented, speech normal.  Assessment & Plan:   Diabetes mellitus, stable (HCC) Uncontrolled. Previous A1c 06/2016 7.9. A1c 01/2017 9.6.  Attributes worsening to steroid shots received over the last month. Prescribed Jardiance at last visit but hasn't taken b/c she read side effects. Wants refill of Glipizide. Counseled on cardiac benefits of Jardiance and encouraged compliance but patient adamant on wanting refill of glipizide. - continue Metformin 1000mg  BID - Refill Glipizide - Follow up next month with PCP for recheck A1c. - counseled on diet  Obesity Patient planning for bariatric surgery at the end of this year. Undergoing preop evaluation. Suspect this will help with diabetic control as well as sciatic pain.  Low back pain Chronic. Symptoms consistent with discogenic nerve pain. Followed by Dr. Maryjean Ka and  has received epidural injections. Has tried many oral medications including gabapentin, lyrica, nucynta, NSAIDs, tylenol but with unwanted side effects.  - Counseled to try tylenol and ibuprofen together - suspect this will improve after surgery and weight loss.  No orders of the defined types were placed in this encounter.  Meds ordered this  encounter  Medications  . vitamin B-12 (CYANOCOBALAMIN) 1000 MCG tablet    Sig: Take 1,000 mcg by mouth daily.  Marland Kitchen glipiZIDE (GLUCOTROL) 5 MG tablet    Sig: Take 1 tablet (5 mg total) by mouth 2 (two) times daily before a meal. Take one tablet in the morning and one half in the evening.    Dispense:  60 tablet    Refill:  [place], DO PGY-1, [facility] 04/01/2017 12:31 PM

## 2017-04-01 ENCOUNTER — Ambulatory Visit (INDEPENDENT_AMBULATORY_CARE_PROVIDER_SITE_OTHER): Payer: BLUE CROSS/BLUE SHIELD | Admitting: Family Medicine

## 2017-04-01 ENCOUNTER — Encounter: Payer: Self-pay | Admitting: Family Medicine

## 2017-04-01 VITALS — BP 120/80 | HR 108 | Temp 98.3°F | Ht 64.5 in | Wt 355.8 lb

## 2017-04-01 DIAGNOSIS — M5442 Lumbago with sciatica, left side: Secondary | ICD-10-CM | POA: Diagnosis not present

## 2017-04-01 DIAGNOSIS — E119 Type 2 diabetes mellitus without complications: Secondary | ICD-10-CM

## 2017-04-01 DIAGNOSIS — Z6841 Body Mass Index (BMI) 40.0 and over, adult: Secondary | ICD-10-CM

## 2017-04-01 DIAGNOSIS — M5441 Lumbago with sciatica, right side: Secondary | ICD-10-CM | POA: Diagnosis not present

## 2017-04-01 MED ORDER — GLIPIZIDE 5 MG PO TABS: 5.0000 mg | ORAL_TABLET | Freq: Two times a day (BID) | ORAL | 3 refills | Status: DC

## 2017-04-01 NOTE — Patient Instructions (Addendum)
It was great to see you!  For your diabetes,  - I refilled your glipizide. - Return next month for an A1c check  For your back pain, - Unfortunately, we have exhausted many available options to control your back pain. - I am hopeful that once you have surgery and begin to lose weight, your back pain will get better. - In the meantime, you can take Tylenol and Ibuprofen together to relieve pain. I do not think that taking Ibuprofen intermittently before you have surgery will affect your surgery prospects. You may ask your surgeon to make sure.  Ask your pharmacist what medications you can crush in preparation for surgery.  I am providing information to schedule a colonoscopy and mammogram. You will need to call these numbers to set an appointment.  Take care and seek immediate care sooner if you develop any concerns.   Valerie Percy, DO [facility]

## 2017-04-01 NOTE — Assessment & Plan Note (Signed)
Chronic. Symptoms consistent with discogenic nerve pain. Followed by Dr. Maryjean Ka and has received epidural injections. Has tried many oral medications including gabapentin, lyrica, nucynta, NSAIDs, tylenol but with unwanted side effects.  - Counseled to try tylenol and ibuprofen together - suspect this will improve after surgery and weight loss.

## 2017-04-01 NOTE — Assessment & Plan Note (Signed)
Uncontrolled. Previous A1c 06/2016 7.9. A1c 01/2017 9.6.  Attributes worsening to steroid shots received over the last month. Prescribed Jardiance at last visit but hasn't taken b/c she read side effects. Wants refill of Glipizide. Counseled on cardiac benefits of Jardiance and encouraged compliance but patient adamant on wanting refill of glipizide. - continue Metformin 1000mg  BID - Refill Glipizide - Follow up next month with PCP for recheck A1c. - counseled on diet

## 2017-04-01 NOTE — Assessment & Plan Note (Signed)
Patient planning for bariatric surgery at the end of this year. Undergoing preop evaluation. Suspect this will help with diabetic control as well as sciatic pain.

## 2017-04-05 ENCOUNTER — Other Ambulatory Visit: Payer: Self-pay | Admitting: Obstetrics and Gynecology

## 2017-04-06 NOTE — Telephone Encounter (Signed)
Patient called stating the glipizide 5 mg was sent to pharmacy with the wrong directions.  Pt should take 5 mg in AM and 2.5 mg in evening.  Pt stated the dosage that was sent is to strong for her. She is also out of medication.  Derl Barrow, RN

## 2017-04-06 NOTE — Telephone Encounter (Signed)
I have placed a faxed version circling the correct one in the stack in the office as well. Please elt me know if I need any further follow up. Thank you

## 2017-04-12 ENCOUNTER — Ambulatory Visit: Payer: BLUE CROSS/BLUE SHIELD | Admitting: Family Medicine

## 2017-04-20 ENCOUNTER — Encounter: Payer: Self-pay | Admitting: Family Medicine

## 2017-04-20 ENCOUNTER — Ambulatory Visit (INDEPENDENT_AMBULATORY_CARE_PROVIDER_SITE_OTHER): Payer: BLUE CROSS/BLUE SHIELD | Admitting: Family Medicine

## 2017-04-20 DIAGNOSIS — E119 Type 2 diabetes mellitus without complications: Secondary | ICD-10-CM

## 2017-04-20 NOTE — Patient Instructions (Addendum)
Check out the website [email or web address] for some recipe ideas, etc.    Goals: 1. Eat at least 3 REAL meals and 2 snacks per day.  Aim for no more than 4-5 hours between eating.    - A REAL meal includes some protein, minimal starch, and vegetables.  - Eat mindfully, taking 30 minutes to get through a meal.   - Fork down between bites; take a pause part-way through the meal.    - Limit beverages to BETWEEN-MEAL times.   2. Water: At least 6 bottles (96 oz) of water per day, SIPPED throughout the day between meals.    - Take two bottles on the bus with you.   3. Physical activity:  Chair exercises as planned each morning M-F,   - Water exercise as you are able to get there!  - Set an alarm when home for every 15 min to get up and move about 2 minutes.    - Make a plan that includes:  1. Several meals that are relatively easy to prepare, are high in protein, with minimal carb.    2. Weekly workout plan, that includes HOW YOU WILL TRACK YOUR ACTIVITY.    - EMAIL YOUR PLAN TO JEANNIE NO LATER THAN NOV 36.    - Let Jeannie know exact surgery date and time (and hospital!).

## 2017-04-20 NOTE — Progress Notes (Signed)
Medical Nutrition Therapy:  Appt start time: 1030 end time:  1130.  Assessment:  Primary concerns today: Weight management and Blood sugar control.  Valerie Carter has had her first couple of RD appts prior to bariatric surgery.  She will be getting gastric sleeve surgery on December 11.  Despite many reservations regarding surgery in the past year, [name] is now prepared for the procedure, and looking forward to what she believes will be a tremendous help in her effort to lose weight.  She has a clear understanding of the importance of following pre- and post-up instructions, and I believe she is an excellent candidate for this procedure.    FBG have been 79-100, averaging ~120, with a few high outliers, but those few high readings are explainable by some foods (or sweet tea) the night before.  She has not been able to get to water exercise b/c of family needs, and hip, back, and knee pain has been limiting.  She has been doing some exercises at home, as tolerated.  She has been averaging ~3700 steps per day.    24-hr recall:  (Up at 5 AM) B (6 AM)-  1 c chx broth, water Snk (9:40)- 1/2 chx brst, 1/2 c potatoes, sliced tomatoes, water L (1:30 PM)-  Unjury shake (21 g protein), water Snk ( PM)-  --- D (6:30 PM)-  1 1/2 c zucchini, squash, salmon patty, 1 c beans, hot sauce, water Snk ( PM)-  --- Typical day? Yes.    Progress Towards Goal(s):  In progress.   Nutritional Diagnosis:  No progress on Sinclair-3.3 Overweight/obesity As related to energy imbalance.  As evidenced by no weight loss in recent months, and inattention to behavioral goals.    Intervention:  Nutrition education.  Handouts given during visit include:  AVS  Demonstrated degree of understanding via:  Teach Back   Monitoring/Evaluation:  Dietary intake, exercise, BG, and body weight prn.

## 2017-05-02 ENCOUNTER — Ambulatory Visit (INDEPENDENT_AMBULATORY_CARE_PROVIDER_SITE_OTHER): Payer: BLUE CROSS/BLUE SHIELD | Admitting: Family Medicine

## 2017-05-02 ENCOUNTER — Encounter: Payer: Self-pay | Admitting: Family Medicine

## 2017-05-02 VITALS — BP 114/76 | HR 112 | Temp 98.1°F | Wt 359.0 lb

## 2017-05-02 DIAGNOSIS — Z1211 Encounter for screening for malignant neoplasm of colon: Secondary | ICD-10-CM

## 2017-05-02 DIAGNOSIS — Z23 Encounter for immunization: Secondary | ICD-10-CM

## 2017-05-02 DIAGNOSIS — Z1231 Encounter for screening mammogram for malignant neoplasm of breast: Secondary | ICD-10-CM | POA: Diagnosis not present

## 2017-05-02 DIAGNOSIS — Z1239 Encounter for other screening for malignant neoplasm of breast: Secondary | ICD-10-CM

## 2017-05-02 DIAGNOSIS — E119 Type 2 diabetes mellitus without complications: Secondary | ICD-10-CM

## 2017-05-02 DIAGNOSIS — Z0000 Encounter for general adult medical examination without abnormal findings: Secondary | ICD-10-CM

## 2017-05-02 DIAGNOSIS — K5900 Constipation, unspecified: Secondary | ICD-10-CM | POA: Diagnosis not present

## 2017-05-02 DIAGNOSIS — M170 Bilateral primary osteoarthritis of knee: Secondary | ICD-10-CM

## 2017-05-02 LAB — POCT GLYCOSYLATED HEMOGLOBIN (HGB A1C): Hemoglobin A1C: 6.7

## 2017-05-02 MED ORDER — POLYETHYLENE GLYCOL 3350 17 GM/SCOOP PO POWD: 17.0000 g | Freq: Every day | ORAL | 1 refills | Status: DC | PRN

## 2017-05-02 MED ORDER — TRAMADOL HCL 50 MG PO TABS: 50.0000 mg | ORAL_TABLET | Freq: Three times a day (TID) | ORAL | 0 refills | Status: AC | PRN

## 2017-05-02 NOTE — Assessment & Plan Note (Signed)
Patient given referral for colonoscopy and mammogram.  Received flu shot today.  Patient to also schedule an eye exam within the next few weeks.

## 2017-05-02 NOTE — Assessment & Plan Note (Signed)
>>  ASSESSMENT AND PLAN FOR ARTHRITIS OF KNEE, DEGENERATIVE WRITTEN ON 05/02/2017 12:21 PM BY SHIRLEY, JORDAN, DO  Patient to have bariatric surgery which she is hopeful will help with her pain.  She currently only takes Motrin  for the pain.  She reports that she will have to stop this 1 week before her surgery.  Given a one-week supply of tramadol  today to take prior to her surgery.  Instructed to also start alternating Tylenol  500 mg every 6 hours as needed with her ibuprofen  400 mg every 4 hours as needed in order to help with her pain.  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

## 2017-05-02 NOTE — Assessment & Plan Note (Addendum)
Hemoglobin A1c is 6.7 today down from 9.6 previously.  Patient reports only taking glipizide and metformin.  Having bariatric surgery and hopeful to stop her glipizide.  We will continue with current medication regimen until after her surgery.  Patient instructed to follow-up with me 1-2 weeks following her surgery, which she says is scheduled for 06/07/2017.

## 2017-05-02 NOTE — Assessment & Plan Note (Signed)
Patient to have bariatric surgery which she is hopeful will help with her pain.  She currently only takes Motrin for the pain.  She reports that she will have to stop this 1 week before her surgery.  Given a one-week supply of tramadol today to take prior to her surgery.  Instructed to also start alternating Tylenol 500 mg every 6 hours as needed with her ibuprofen 400 mg every 4 hours as needed in order to help with her pain.  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

## 2017-05-02 NOTE — Patient Instructions (Signed)
Thank you for coming to see me today. It was a pleasure! Today we talked about:   Preparation for your surgery. Please alternate tylenol extra strength 500 mg every 6 hours as needed, with ibuprofen 400 mg every 4 hours as needed for pain management. I have written for tramadol 50 mg every 8 hours as needed for pain, to be taken the week prior to your surgery.   Please follow-up with me 1-2 weeks following your surgery.  If you have any questions or concerns, please do not hesitate to call the office at [phone].  Take Care,   [place] Theola Cuellar, DO

## 2017-05-02 NOTE — Progress Notes (Signed)
Subjective:    Patient ID: Valerie Carter, female    DOB: May 29, 1961, 56 y.o.   MRN: [id number]   CC: Meet new PCP  HPI:  Diabetes: 90-160's Last A1c 9.6 on 01/28/2017 Taking medications: metformin, glipizide On Aspirin, and not taking a statin Last eye exam: patient to schedule, has been 2 years Last foot exam: up to date ROS: denies fever, chills, dizziness, diaphoresis, LOC, polyuria, polydipsia  Bilateral knee pain: Patient with long history of joint pain in her knees.  Has tried injections multiple times which helped for short period.  She now just takes Motrin 400 mg every 8 hours which does not always relieve the pain.  She was recently written for Nucynta however patient did not take due to reading the side effects.  She is scheduled for her bariatric surgery in December and would like something more to help her until her surgery.  She was instructed to stop all NSAIDs 1 week prior to her surgery.  She would like something for that one week as well since she is not allowed to continue with her Motrin.  Healthcare Maintenance - Vaccines: flu - Colonoscopy: due and will schedule - Mammogram: due and will schedule - Lipid Panel: wnl in 12/2015 -Patient to have bariatric surgery tentatively scheduled for 06/07/2017 for the sleeve  Smoking status reviewed  Review of Systems  Per HPI, else denies recent illness, fever, headache, changes in vision, chest pain, shortness of breath, abdominal pain, N/V/D, weakness   Patient Active Problem List   Diagnosis Date Noted  . Carpal tunnel syndrome 07/23/2016  . Intramural leiomyoma of uterus 01/27/2016  . Healthcare maintenance 02/06/2015  . Mild depression ([place]) 09/25/2014  . Vitamin D deficiency 10/19/2013  . OSA (obstructive sleep apnea) 08/17/2013  . Fatigue 06/13/2013  . Seasonal allergies 09/07/2011  . 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 Diabetes mellitus, stable ([place]) 05/20/2010  . Low back pain  01/29/2010  . Hyperlipidemia LDL goal <70 10/01/2008  . Obesity 10/24/2006  . HYPERTENSION, BENIGN ESSENTIAL 09/26/2006     Objective:  BP 114/76   Pulse (!) 112   Temp 98.1 F (36.7 C) (Oral)   Wt (!) 359 lb (162.8 kg)   SpO2 98%   BMI 59.74 kg/m  Vitals and nursing note reviewed  General: NAD, pleasant Cardiac: RRR, normal heart sounds, no murmurs. 2+ radial and PT pulses bilaterally Respiratory: CTAB, normal effort Abdomen: soft, nontender, nondistended. Bowel sounds present Extremities: no edema or cyanosis. WWP.  Gait: Abnormal gait with limited range of motion of the knees due to pain.  Patient uses a walking stick. Skin: warm and dry, no rashes noted Neuro: alert and oriented, no focal deficits   Assessment & Plan:    Diabetes mellitus, stable (HCC) Hemoglobin A1c is 6.7 today down from 9.6 previously.  Patient reports only taking glipizide and metformin.  Having bariatric surgery and hopeful to stop her glipizide.  We will continue with current medication regimen until after her surgery.  Patient instructed to follow-up with me 1-2 weeks following her surgery, which she says is scheduled for 06/07/2017.  Healthcare maintenance Patient given referral for colonoscopy and mammogram.  Received flu shot today.  Patient to also schedule an eye exam within the next few weeks.  DJD (degenerative joint disease) of knee Patient to have bariatric surgery which she is hopeful will help with her pain.  She currently only takes Motrin for the pain.  She reports that she will  have to stop this 1 week before her surgery.  Given a one-week supply of tramadol today to take prior to her surgery.  Instructed to also start alternating Tylenol 500 mg every 6 hours as needed with her ibuprofen 400 mg every 4 hours as needed in order to help with her pain.  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    [place] Noelia Lenart, DO Family Medicine Resident PGY-1

## 2017-05-09 ENCOUNTER — Other Ambulatory Visit: Payer: Self-pay | Admitting: Obstetrics and Gynecology

## 2017-05-25 NOTE — Progress Notes (Signed)
Cardiology Office Note   Date:  05/26/2017   ID:  [name], DOB [birth year], MRN [id number]  PCP:  Shirley, [place], DO  Cardiologist:   Mariela Rex [place], MD   Chief Complaint  Patient presents with  . Pre-op Exam      History of Present Illness: Valerie Carter is a 56 y.o. female who presents for for pre op cardiac evaluation for bariatric surgery.  She has a history of DM, HLD, and HTN. She also has morbid obesity. She is planning to have bariatric surgery on Dec 11 with Dr. Kieth Brightly. She has no prior history of heart disease. No CAD, CHF, or arrhythmia. She denies any chest pain, SOB, palpitations, or edema. She is able to walk for 15 minutes without restriction. She does water aerobics 2 days a week. She is mainly limited by knee and back pain related to OA. She is a nonsmoker and has no family history of CAD.   Past Medical History:  Diagnosis Date  . Back pain   . Carpal tunnel syndrome   . Depression   . Diabetes mellitus without complication ([place])   . DJD (degenerative joint disease)   . Hyperlipidemia   . Hypertension   . Intramural leiomyoma of uterus   . Knee pain   . Morbid obesity with BMI of 50.0-59.9, adult ([place])   . OSA (obstructive sleep apnea)   . Vitamin D deficiency     Past Surgical History:  Procedure Laterality Date  . KNEE SURGERY Right      Current Outpatient Medications  Medication Sig Dispense Refill  . amLODipine (NORVASC) 5 MG tablet Take 5 mg by mouth at bedtime.     Marland Kitchen aspirin 81 MG chewable tablet Chew 81 mg by mouth daily. Reported on 11/11/2015    . benazepril (LOTENSIN) 40 MG tablet TAKE 1 TABLET BY MOUTH ONCE DAILY 90 tablet 0  . Blood Glucose Monitoring Suppl (ONE TOUCH ULTRA SYSTEM KIT) W/DEVICE KIT 1 kit by Does not apply route once. 1 each 0  . cholecalciferol (VITAMIN D) 1000 units tablet Take 1,000 Units by mouth daily.    . ferrous sulfate 325 (65 FE) MG tablet TAKE ONE TABLET BY MOUTH ONCE DAILY WITH BREAKFAST 30 tablet  11  . glipiZIDE (GLUCOTROL) 5 MG tablet TAKE ONE TABLET BY MOUTH IN THE MORNING AND ONE-HALF IN THE EVENING 45 tablet 5  . glucose blood test strip Use as instructed 100 each 12  . hydrochlorothiazide (HYDRODIURIL) 25 MG tablet TAKE ONE TABLET BY MOUTH ONCE DAILY 90 tablet 0  . metFORMIN (GLUCOPHAGE) 1000 MG tablet TAKE ONE TABLET BY MOUTH TWICE DAILY WITH MEALS 180 tablet 4  . OMEGA-3 FATTY ACIDS PO Take by mouth.    . polyethylene glycol powder (GLYCOLAX/MIRALAX) powder Take 17 g daily as needed by mouth. 250 g 1  . TURMERIC PO Take 750 mg by mouth 2 (two) times daily.    . vitamin B-12 (CYANOCOBALAMIN) 1000 MCG tablet Take 1,000 mcg by mouth daily.    . vitamin C (ASCORBIC ACID) 500 MG tablet Take 500 mg by mouth daily.     No current facility-administered medications for this visit.     Allergies:   Patient has no known allergies.    Social History:  The patient  reports that  has never smoked. she has never used smokeless tobacco. She reports that she drinks alcohol. She reports that she does not use drugs.   Family History:  The patient's  family history includes Breast cancer in her maternal aunt; Diabetes in her father, maternal aunt, and sister; Hypertension in her sister.    ROS:  Please see the history of present illness.   Otherwise, review of systems are positive for .   All other systems are reviewed and negative.    PHYSICAL EXAM: VS:  BP (!) 139/94   Pulse 95   Ht _0  (1.651 m)   Wt (!) 353 lb (160.1 kg)   SpO2 98%   BMI 58.74 kg/m  , BMI Body mass index is 58.74 kg/m. GEN: Well nourished, morbidly obese BF, in no acute distress  HEENT: normal  Neck: no JVD, carotid bruits, or masses Cardiac: RRR; no murmurs, rubs, or gallops,no edema  Respiratory:  clear to auscultation bilaterally, normal work of breathing GI: soft, nontender, nondistended, + BS MS: no deformity or atrophy  Skin: warm and dry, no rash Neuro:  Strength and sensation are intact Psych:  euthymic mood, full affect   EKG:  EKG is ordered today. The ekg ordered today demonstrates NSR, LAD. Otherwise normal. I have personally reviewed and interpreted this study.    Recent Labs: 07/23/2016: BUN 17; Creat 1.03; Potassium 4.3; Sodium 139    Lipid Panel    Component Value Date/Time   CHOL 167 02/08/2017 1105   TRIG 54 02/08/2017 1105   HDL 48 02/08/2017 1105   CHOLHDL 3.5 02/08/2017 1105   CHOLHDL 3.5 12/29/2015 0908   VLDL 19 12/29/2015 0908   LDLCALC 108 (H) 02/08/2017 1105   LDLDIRECT 114 (H) 09/07/2011 1044      Wt Readings from Last 3 Encounters:  05/26/17 (!) 353 lb (160.1 kg)  05/02/17 (!) 359 lb (162.8 kg)  04/20/17 (!) 359 lb 6.4 oz (163 kg)     Body mass index is 58.74 kg/m.  Other studies Reviewed: Additional studies/ records that were reviewed today include: none   ASSESSMENT AND PLAN:  1.  Morbid obesity. Pre op evaluation for Bariatric surgery. She has multiple cardiac risk factors including HTN, DM, and mild hypercholesterolemia. No known history of cardiac disease. She is asymptomatic and is able to function with at least a 7 met level. Ecg is unremarkable. I feel she is an acceptable risk from my standpoint for bariatric surgery. No other cardiac testing recommended. 2. HTN controlled 3. DM type 2 4. Mild hypercholesterolemia. Patient wants to avoid statin therapy and see how she responds to weight loss.    Labs/ tests ordered today include:  No orders of the defined types were placed in this encounter.    Disposition:   FU with me PRN  Signed, Brinley Rosete [place], MD  05/26/2017 10:59 AM    [facility] [address] [place], [place], [address] Phone [phone], Fax [phone]

## 2017-05-26 ENCOUNTER — Ambulatory Visit: Payer: BLUE CROSS/BLUE SHIELD | Admitting: Cardiology

## 2017-05-26 ENCOUNTER — Encounter: Payer: Self-pay | Admitting: Cardiology

## 2017-05-26 DIAGNOSIS — I10 Essential (primary) hypertension: Secondary | ICD-10-CM | POA: Diagnosis not present

## 2017-05-26 DIAGNOSIS — E7800 Pure hypercholesterolemia, unspecified: Secondary | ICD-10-CM | POA: Diagnosis not present

## 2017-05-26 DIAGNOSIS — Z01810 Encounter for preprocedural cardiovascular examination: Secondary | ICD-10-CM | POA: Diagnosis not present

## 2017-05-26 DIAGNOSIS — E118 Type 2 diabetes mellitus with unspecified complications: Secondary | ICD-10-CM

## 2017-05-30 NOTE — Progress Notes (Signed)
Preop on 12/5.  Need orders in epic

## 2017-05-31 NOTE — Patient Instructions (Addendum)
Valerie Carter  05/31/2017   Your procedure is scheduled on: 06-07-17  Report to [facility] Main  Entrance Take [place]  elevators to 3rd floor to  [place] at 745 AM.   Call this number if you have problems the morning of surgery [phone]   Remember: ONLY 1 PERSON MAY GO WITH YOU TO SHORT STAY TO GET  READY MORNING OF [place].  Do not eat food or drink liquids :After Midnight.     Take these medicines the morning of surgery with A SIP OF WATER:AMLODIPINE, tylenol arthritis, prn  DO NOT TAKE ANY DIABETIC MEDICATIONS DAY OF YOUR SURGERY                               You may not have any metal on your body including hair pins and              piercings  Do not wear jewelry, make-up, lotions, powders or perfumes, deodorant             Do not wear nail polish.  Do not shave  48 hours prior to surgery.              Men may shave face and neck.   Do not bring valuables to the hospital. [place].  Contacts, dentures or bridgework may not be worn into surgery.  Leave suitcase in the car. After surgery it may be brought to your room.                Please read over the following fact sheets you were given: _____________________________________________________________________             How to Manage Your Diabetes Before and After Surgery  Why is it important to control my blood sugar before and after surgery? . Improving blood sugar levels before and after surgery helps healing and can limit problems. . A way of improving blood sugar control is eating a healthy diet by: o  Eating less sugar and carbohydrates o  Increasing activity/exercise o  Talking with your doctor about reaching your blood sugar goals . High blood sugars (greater than 180 mg/dL) can raise your risk of infections and slow your recovery, so you will need to focus on controlling your diabetes during the weeks before  surgery. . Make sure that the doctor who takes care of your diabetes knows about your planned surgery including the date and location.  How do I manage my blood sugar before surgery? . Check your blood sugar at least 4 times a day, starting 2 days before surgery, to make sure that the level is not too high or low. o Check your blood sugar the morning of your surgery when you wake up and every 2 hours until you get to the Short Stay unit. . If your blood sugar is less than 70 mg/dL, you will need to treat for low blood sugar: o Do not take insulin. o Treat a low blood sugar (less than 70 mg/dL) with  cup of clear juice (cranberry or apple), 4 glucose tablets, OR glucose gel. o Recheck blood sugar in 15 minutes after treatment (to make sure it is greater than 70 mg/dL). If your blood sugar  is not greater than 70 mg/dL on recheck, call [phone] for further instructions. . Report your blood sugar to the short stay nurse when you get to Short Stay.  . If you are admitted to the hospital after surgery: o Your blood sugar will be checked by the staff and you will probably be given insulin after surgery (instead of oral diabetes medicines) to make sure you have good blood sugar levels. o The goal for blood sugar control after surgery is 80-180 mg/dL.   WHAT DO I DO ABOUT MY DIABETES MEDICATION?   . THE DAY BEFORE SURGERY 06-06-17 TAKE YOUR MORNING DOSE  OF GLIPIZIDE ONLY. TAKE YOUR METFORMIN AS USUAL     THE MORNING OF SURGERY 06-07-17  DO NOT TAKE ANY DIABETIC MEDS.    [facility] - Preparing for Surgery Before surgery, you can play an important role.  Because skin is not sterile, your skin needs to be as free of germs as possible.  You can reduce the number of germs on your skin by washing with CHG (chlorahexidine gluconate) soap before surgery.  CHG is an antiseptic cleaner which kills germs and bonds with the skin to continue killing germs even after washing. Please DO NOT use if you have  an allergy to CHG or antibacterial soaps.  If your skin becomes reddened/irritated stop using the CHG and inform your nurse when you arrive at Short Stay. Do not shave (including legs and underarms) for at least 48 hours prior to the first CHG shower.  You may shave your face/neck. Please follow these instructions carefully:  1.  Shower with CHG Soap the night before surgery and the  morning of Surgery.  2.  If you choose to wash your hair, wash your hair first as usual with your  normal  shampoo.  3.  After you shampoo, rinse your hair and body thoroughly to remove the  shampoo.                           4.  Use CHG as you would any other liquid soap.  You can apply chg directly  to the skin and wash                       Gently with a scrungie or clean washcloth.  5.  Apply the CHG Soap to your body ONLY FROM THE NECK DOWN.   Do not use on face/ open                           Wound or open sores. Avoid contact with eyes, ears mouth and genitals (private parts).                       Wash face,  Genitals (private parts) with your normal soap.             6.  Wash thoroughly, paying special attention to the area where your surgery  will be performed.  7.  Thoroughly rinse your body with warm water from the neck down.  8.  DO NOT shower/wash with your normal soap after using and rinsing off  the CHG Soap.                9.  Pat yourself dry with a clean towel.            10.  Wear  clean pajamas.            11.  Place clean sheets on your bed the night of your first shower and do not  sleep with pets. Day of Surgery : Do not apply any lotions/deodorants the morning of surgery.  Please wear clean clothes to the hospital/surgery center.  FAILURE TO FOLLOW THESE INSTRUCTIONS MAY RESULT IN THE CANCELLATION OF YOUR SURGERY PATIENT SIGNATURE_________________________________  NURSE SIGNATURE__________________________________  ________________________________________________________________________

## 2017-06-01 ENCOUNTER — Encounter (HOSPITAL_COMMUNITY)
Admission: RE | Admit: 2017-06-01 | Discharge: 2017-06-01 | Disposition: A | Discharge status: Home / Self Care | Payer: BLUE CROSS/BLUE SHIELD | Source: Ambulatory Visit | Attending: General Surgery | Admitting: General Surgery

## 2017-06-01 ENCOUNTER — Encounter (HOSPITAL_COMMUNITY): Payer: Self-pay

## 2017-06-01 ENCOUNTER — Other Ambulatory Visit: Payer: Self-pay

## 2017-06-01 DIAGNOSIS — I10 Essential (primary) hypertension: Secondary | ICD-10-CM | POA: Diagnosis not present

## 2017-06-01 DIAGNOSIS — Z8249 Family history of ischemic heart disease and other diseases of the circulatory system: Secondary | ICD-10-CM | POA: Diagnosis not present

## 2017-06-01 DIAGNOSIS — E7800 Pure hypercholesterolemia, unspecified: Secondary | ICD-10-CM | POA: Diagnosis not present

## 2017-06-01 DIAGNOSIS — D119 Benign neoplasm of major salivary gland, unspecified: Secondary | ICD-10-CM | POA: Insufficient documentation

## 2017-06-01 DIAGNOSIS — Z803 Family history of malignant neoplasm of breast: Secondary | ICD-10-CM | POA: Diagnosis not present

## 2017-06-01 DIAGNOSIS — Z79899 Other long term (current) drug therapy: Secondary | ICD-10-CM | POA: Insufficient documentation

## 2017-06-01 DIAGNOSIS — G4733 Obstructive sleep apnea (adult) (pediatric): Secondary | ICD-10-CM | POA: Diagnosis not present

## 2017-06-01 DIAGNOSIS — Z833 Family history of diabetes mellitus: Secondary | ICD-10-CM | POA: Insufficient documentation

## 2017-06-01 DIAGNOSIS — Z9889 Other specified postprocedural states: Secondary | ICD-10-CM | POA: Diagnosis not present

## 2017-06-01 DIAGNOSIS — Z01812 Encounter for preprocedural laboratory examination: Secondary | ICD-10-CM | POA: Diagnosis not present

## 2017-06-01 HISTORY — DX: Headache, unspecified: R51.9

## 2017-06-01 HISTORY — DX: Headache: R51

## 2017-06-01 LAB — COMPREHENSIVE METABOLIC PANEL
ALK PHOS: 71 U/L (ref 38–126)
ALT: 22 U/L (ref 14–54)
AST: 30 U/L (ref 15–41)
Albumin: 3.6 g/dL (ref 3.5–5.0)
Anion gap: 11 (ref 5–15)
BUN: 22 mg/dL — AB (ref 6–20)
CHLORIDE: 104 mmol/L (ref 101–111)
CO2: 24 mmol/L (ref 22–32)
CREATININE: 0.75 mg/dL (ref 0.44–1.00)
Calcium: 10.3 mg/dL (ref 8.9–10.3)
GFR calc Af Amer: >60 mL/min (ref >60)
Glucose, Bld: 95 mg/dL (ref 65–99)
Potassium: 4.1 mmol/L (ref 3.5–5.1)
SODIUM: 139 mmol/L (ref 135–145)
Total Bilirubin: 0.5 mg/dL (ref 0.3–1.2)
Total Protein: 8.1 g/dL (ref 6.5–8.1)

## 2017-06-01 LAB — DIFFERENTIAL
BASOS ABS: 0 10*3/uL (ref 0.0–0.1)
BASOS PCT: 0 %
Eosinophils Absolute: 0.3 10*3/uL (ref 0.0–0.7)
Eosinophils Relative: 3 %
LYMPHS PCT: 32 %
Lymphs Abs: 3.2 10*3/uL (ref 0.7–4.0)
MONOS PCT: 8 %
Monocytes Absolute: 0.8 10*3/uL (ref 0.1–1.0)
NEUTROS ABS: 5.7 10*3/uL (ref 1.7–7.7)
Neutrophils Relative %: 57 %

## 2017-06-01 LAB — CBC
HEMATOCRIT: 38 % (ref 36.0–46.0)
Hemoglobin: 11.6 g/dL — ABNORMAL LOW (ref 12.0–15.0)
MCH: 23.1 pg — ABNORMAL LOW (ref 26.0–34.0)
MCHC: 30.5 g/dL (ref 30.0–36.0)
MCV: 75.7 fL — ABNORMAL LOW (ref 78.0–100.0)
Platelets: 370 10*3/uL (ref 150–400)
RBC: 5.02 MIL/uL (ref 3.87–5.11)
RDW: 16.3 % — AB (ref 11.5–15.5)
WBC: 10 10*3/uL (ref 4.0–10.5)

## 2017-06-01 NOTE — Progress Notes (Signed)
Chest xray 02-25-17 epic ekg 05-26-17 epic Cardiac clearance dr jordan11-29-18 epic

## 2017-06-02 ENCOUNTER — Encounter: Payer: Self-pay | Admitting: Family Medicine

## 2017-06-02 ENCOUNTER — Ambulatory Visit: Payer: Self-pay | Admitting: General Surgery

## 2017-06-02 LAB — GLUCOSE, CAPILLARY: GLUCOSE-CAPILLARY: 99 mg/dL (ref 65–99)

## 2017-06-03 DIAGNOSIS — E1169 Type 2 diabetes mellitus with other specified complication: Secondary | ICD-10-CM | POA: Diagnosis not present

## 2017-06-03 DIAGNOSIS — E669 Obesity, unspecified: Secondary | ICD-10-CM | POA: Diagnosis not present

## 2017-06-03 DIAGNOSIS — E785 Hyperlipidemia, unspecified: Secondary | ICD-10-CM | POA: Diagnosis not present

## 2017-06-07 ENCOUNTER — Inpatient Hospital Stay (HOSPITAL_COMMUNITY): Payer: BLUE CROSS/BLUE SHIELD | Admitting: Anesthesiology

## 2017-06-07 ENCOUNTER — Encounter (HOSPITAL_COMMUNITY)
Admission: RE | Disposition: A | Discharge status: Home / Self Care | Payer: Self-pay | Source: Ambulatory Visit | Attending: General Surgery

## 2017-06-07 ENCOUNTER — Other Ambulatory Visit: Payer: Self-pay

## 2017-06-07 ENCOUNTER — Encounter (HOSPITAL_COMMUNITY): Payer: Self-pay | Admitting: Anesthesiology

## 2017-06-07 ENCOUNTER — Inpatient Hospital Stay (HOSPITAL_COMMUNITY)
Admission: RE | Admit: 2017-06-07 | Discharge: 2017-06-08 | DRG: 621 | Disposition: A | Discharge status: Home / Self Care | Payer: BLUE CROSS/BLUE SHIELD | Source: Ambulatory Visit | Attending: General Surgery | Admitting: General Surgery

## 2017-06-07 DIAGNOSIS — Z6841 Body Mass Index (BMI) 40.0 and over, adult: Secondary | ICD-10-CM | POA: Diagnosis not present

## 2017-06-07 DIAGNOSIS — I10 Essential (primary) hypertension: Secondary | ICD-10-CM | POA: Diagnosis not present

## 2017-06-07 DIAGNOSIS — Z7984 Long term (current) use of oral hypoglycemic drugs: Secondary | ICD-10-CM | POA: Diagnosis not present

## 2017-06-07 DIAGNOSIS — Z833 Family history of diabetes mellitus: Secondary | ICD-10-CM | POA: Diagnosis not present

## 2017-06-07 DIAGNOSIS — Z8249 Family history of ischemic heart disease and other diseases of the circulatory system: Secondary | ICD-10-CM | POA: Diagnosis not present

## 2017-06-07 DIAGNOSIS — E785 Hyperlipidemia, unspecified: Secondary | ICD-10-CM | POA: Diagnosis not present

## 2017-06-07 DIAGNOSIS — K2950 Unspecified chronic gastritis without bleeding: Secondary | ICD-10-CM | POA: Diagnosis not present

## 2017-06-07 DIAGNOSIS — E119 Type 2 diabetes mellitus without complications: Secondary | ICD-10-CM | POA: Diagnosis present

## 2017-06-07 HISTORY — PX: LAPAROSCOPIC GASTRIC SLEEVE RESECTION: SHX5895

## 2017-06-07 LAB — PREGNANCY, URINE: PREG TEST UR: NEGATIVE

## 2017-06-07 LAB — ABO/RH: ABO/RH(D): AB NEG

## 2017-06-07 LAB — TYPE AND SCREEN
ABO/RH(D): AB NEG
ANTIBODY SCREEN: NEGATIVE

## 2017-06-07 LAB — GLUCOSE, CAPILLARY
GLUCOSE-CAPILLARY: 202 mg/dL — AB (ref 65–99)
GLUCOSE-CAPILLARY: 224 mg/dL — AB (ref 65–99)
Glucose-Capillary: 118 mg/dL — ABNORMAL HIGH (ref 65–99)

## 2017-06-07 LAB — HEMOGLOBIN AND HEMATOCRIT, BLOOD
HEMATOCRIT: 40.6 % (ref 36.0–46.0)
HEMOGLOBIN: 12.5 g/dL (ref 12.0–15.0)

## 2017-06-07 SURGERY — GASTRECTOMY, SLEEVE, LAPAROSCOPIC
Anesthesia: General | Site: Abdomen

## 2017-06-07 MED ORDER — HYDROMORPHONE HCL 1 MG/ML IJ SOLN
0.2500 mg | INTRAMUSCULAR | Status: DC | PRN
  Administered 2017-06-07: 0.25 mg via INTRAVENOUS
  Administered 2017-06-07 (×3): 0.5 mg via INTRAVENOUS
  Administered 2017-06-07: 0.25 mg via INTRAVENOUS

## 2017-06-07 MED ORDER — KETAMINE HCL 10 MG/ML IJ SOLN
INTRAMUSCULAR | Status: AC
  Filled 2017-06-07: qty 1

## 2017-06-07 MED ORDER — APREPITANT 40 MG PO CAPS
40.0000 mg | ORAL_CAPSULE | ORAL | Status: AC
  Administered 2017-06-07: 40 mg via ORAL
  Filled 2017-06-07: qty 1

## 2017-06-07 MED ORDER — 0.9 % SODIUM CHLORIDE (POUR BTL) OPTIME
TOPICAL | Status: DC | PRN
  Administered 2017-06-07: 1000 mL

## 2017-06-07 MED ORDER — SUGAMMADEX SODIUM 500 MG/5ML IV SOLN
INTRAVENOUS | Status: AC
  Filled 2017-06-07: qty 5

## 2017-06-07 MED ORDER — HYDROMORPHONE HCL 1 MG/ML IJ SOLN
INTRAMUSCULAR | Status: AC
Start: 2017-06-07 — End: 2017-06-07
  Filled 2017-06-07: qty 1

## 2017-06-07 MED ORDER — LIDOCAINE 2% (20 MG/ML) 5 ML SYRINGE
INTRAMUSCULAR | Status: DC | PRN
Start: 2017-06-07 — End: 2017-06-07
  Administered 2017-06-07: 100 mg via INTRAVENOUS

## 2017-06-07 MED ORDER — PROPOFOL 10 MG/ML IV BOLUS
INTRAVENOUS | Status: DC | PRN
  Administered 2017-06-07: 200 mg via INTRAVENOUS

## 2017-06-07 MED ORDER — INSULIN ASPART 100 UNIT/ML ~~LOC~~ SOLN
0.0000 [IU] | Freq: Three times a day (TID) | SUBCUTANEOUS | Status: DC
  Administered 2017-06-07: 7 [IU] via SUBCUTANEOUS

## 2017-06-07 MED ORDER — LABETALOL HCL 5 MG/ML IV SOLN
INTRAVENOUS | Status: AC
  Filled 2017-06-07: qty 4

## 2017-06-07 MED ORDER — ONDANSETRON HCL 4 MG/2ML IJ SOLN
INTRAMUSCULAR | Status: AC
  Filled 2017-06-07: qty 4

## 2017-06-07 MED ORDER — BENAZEPRIL HCL 10 MG PO TABS
40.0000 mg | ORAL_TABLET | Freq: Every day | ORAL | Status: DC
  Administered 2017-06-07: 40 mg via ORAL
  Filled 2017-06-07: qty 4

## 2017-06-07 MED ORDER — HYDRALAZINE HCL 20 MG/ML IJ SOLN: 10.0000 mg | INTRAMUSCULAR | Status: DC | PRN

## 2017-06-07 MED ORDER — BUPIVACAINE HCL 0.25 % IJ SOLN
INTRAMUSCULAR | Status: DC | PRN
  Administered 2017-06-07: 30 mL

## 2017-06-07 MED ORDER — AMLODIPINE BESYLATE 5 MG PO TABS
5.0000 mg | ORAL_TABLET | Freq: Every day | ORAL | Status: DC
  Administered 2017-06-07: 5 mg via ORAL
  Filled 2017-06-07: qty 1

## 2017-06-07 MED ORDER — KETAMINE HCL 10 MG/ML IJ SOLN
INTRAMUSCULAR | Status: DC | PRN
  Administered 2017-06-07: 10 mg via INTRAVENOUS
  Administered 2017-06-07: 30 mg via INTRAVENOUS
  Administered 2017-06-07: 10 mg via INTRAVENOUS

## 2017-06-07 MED ORDER — FENTANYL CITRATE (PF) 100 MCG/2ML IJ SOLN
INTRAMUSCULAR | Status: AC
  Filled 2017-06-07: qty 2

## 2017-06-07 MED ORDER — HYDRALAZINE HCL 20 MG/ML IJ SOLN
10.0000 mg | INTRAMUSCULAR | Status: DC | PRN
  Administered 2017-06-07: 10 mg via INTRAVENOUS
  Filled 2017-06-07: qty 1

## 2017-06-07 MED ORDER — LABETALOL HCL 5 MG/ML IV SOLN
10.0000 mg | INTRAVENOUS | Status: AC | PRN
  Administered 2017-06-07 (×2): 10 mg via INTRAVENOUS

## 2017-06-07 MED ORDER — PREMIER PROTEIN SHAKE
2.0000 [oz_av] | ORAL | Status: DC
  Administered 2017-06-08: 2 [oz_av] via ORAL

## 2017-06-07 MED ORDER — ENOXAPARIN SODIUM 30 MG/0.3ML ~~LOC~~ SOLN
30.0000 mg | Freq: Two times a day (BID) | SUBCUTANEOUS | Status: DC
  Administered 2017-06-07 – 2017-06-08 (×3): 30 mg via SUBCUTANEOUS
  Filled 2017-06-07 (×3): qty 0.3

## 2017-06-07 MED ORDER — ACETAMINOPHEN 160 MG/5ML PO SOLN
650.0000 mg | Freq: Four times a day (QID) | ORAL | Status: DC
  Administered 2017-06-07 – 2017-06-08 (×3): 650 mg via ORAL
  Filled 2017-06-07 (×3): qty 20.3

## 2017-06-07 MED ORDER — CHLORHEXIDINE GLUCONATE 4 % EX LIQD: 60.0000 mL | Freq: Once | CUTANEOUS | Status: DC

## 2017-06-07 MED ORDER — DEXAMETHASONE SODIUM PHOSPHATE 4 MG/ML IJ SOLN: 4.0000 mg | INTRAMUSCULAR | Status: DC

## 2017-06-07 MED ORDER — LACTATED RINGERS IV SOLN
INTRAVENOUS | Status: DC
  Administered 2017-06-07: 08:00:00 via INTRAVENOUS

## 2017-06-07 MED ORDER — ROCURONIUM BROMIDE 10 MG/ML (PF) SYRINGE
PREFILLED_SYRINGE | INTRAVENOUS | Status: DC | PRN
  Administered 2017-06-07 (×2): 20 mg via INTRAVENOUS
  Administered 2017-06-07: 50 mg via INTRAVENOUS

## 2017-06-07 MED ORDER — PHENYLEPHRINE 40 MCG/ML (10ML) SYRINGE FOR IV PUSH (FOR BLOOD PRESSURE SUPPORT)
PREFILLED_SYRINGE | INTRAVENOUS | Status: AC
Start: 2017-06-07 — End: ?
  Filled 2017-06-07: qty 10

## 2017-06-07 MED ORDER — ONDANSETRON HCL 4 MG/2ML IJ SOLN: 4.0000 mg | INTRAMUSCULAR | Status: DC | PRN

## 2017-06-07 MED ORDER — OXYCODONE HCL 5 MG/5ML PO SOLN
5.0000 mg | ORAL | Status: DC | PRN
  Administered 2017-06-08 (×2): 10 mg via ORAL
  Filled 2017-06-07 (×2): qty 10

## 2017-06-07 MED ORDER — SODIUM CHLORIDE 0.9 % IV SOLN
INTRAVENOUS | Status: DC
  Administered 2017-06-07 – 2017-06-08 (×3): via INTRAVENOUS

## 2017-06-07 MED ORDER — PANTOPRAZOLE SODIUM 40 MG IV SOLR
40.0000 mg | Freq: Every day | INTRAVENOUS | Status: DC
  Administered 2017-06-07: 40 mg via INTRAVENOUS
  Filled 2017-06-07: qty 40

## 2017-06-07 MED ORDER — GABAPENTIN 300 MG PO CAPS
300.0000 mg | ORAL_CAPSULE | ORAL | Status: AC
  Administered 2017-06-07: 300 mg via ORAL
  Filled 2017-06-07: qty 1

## 2017-06-07 MED ORDER — LIDOCAINE 2% (20 MG/ML) 5 ML SYRINGE
INTRAMUSCULAR | Status: DC | PRN
  Administered 2017-06-07: 1 mg/kg/h via INTRAVENOUS

## 2017-06-07 MED ORDER — DEXAMETHASONE SODIUM PHOSPHATE 10 MG/ML IJ SOLN
INTRAMUSCULAR | Status: AC
  Filled 2017-06-07: qty 2

## 2017-06-07 MED ORDER — LACTATED RINGERS IR SOLN
Status: DC | PRN
  Administered 2017-06-07: 1000 mL

## 2017-06-07 MED ORDER — CEFOTETAN DISODIUM-DEXTROSE 2-2.08 GM-%(50ML) IV SOLR
2.0000 g | INTRAVENOUS | Status: AC
  Administered 2017-06-07: 2 g via INTRAVENOUS
  Filled 2017-06-07: qty 50

## 2017-06-07 MED ORDER — SUGAMMADEX SODIUM 500 MG/5ML IV SOLN
INTRAVENOUS | Status: DC | PRN
  Administered 2017-06-07: 400 mg via INTRAVENOUS

## 2017-06-07 MED ORDER — BUPIVACAINE LIPOSOME 1.3 % IJ SUSP
20.0000 mL | Freq: Once | INTRAMUSCULAR | Status: AC
  Administered 2017-06-07: 20 mL
  Filled 2017-06-07: qty 20

## 2017-06-07 MED ORDER — GABAPENTIN 250 MG/5ML PO SOLN
200.0000 mg | Freq: Two times a day (BID) | ORAL | Status: DC
  Administered 2017-06-08 (×2): 200 mg via ORAL
  Filled 2017-06-07 (×2): qty 4

## 2017-06-07 MED ORDER — ACETAMINOPHEN 500 MG PO TABS
1000.0000 mg | ORAL_TABLET | ORAL | Status: AC
  Administered 2017-06-07: 1000 mg via ORAL
  Filled 2017-06-07: qty 2

## 2017-06-07 MED ORDER — MIDAZOLAM HCL 5 MG/5ML IJ SOLN
INTRAMUSCULAR | Status: DC | PRN
  Administered 2017-06-07: 2 mg via INTRAVENOUS

## 2017-06-07 MED ORDER — FENTANYL CITRATE (PF) 100 MCG/2ML IJ SOLN
INTRAMUSCULAR | Status: DC | PRN
  Administered 2017-06-07: 100 ug via INTRAVENOUS

## 2017-06-07 MED ORDER — HEPARIN SODIUM (PORCINE) 5000 UNIT/ML IJ SOLN
5000.0000 [IU] | INTRAMUSCULAR | Status: AC
  Administered 2017-06-07: 5000 [IU] via SUBCUTANEOUS
  Filled 2017-06-07: qty 1

## 2017-06-07 MED ORDER — ONDANSETRON HCL 4 MG/2ML IJ SOLN
INTRAMUSCULAR | Status: DC | PRN
  Administered 2017-06-07: 4 mg via INTRAVENOUS

## 2017-06-07 MED ORDER — PHENYLEPHRINE 40 MCG/ML (10ML) SYRINGE FOR IV PUSH (FOR BLOOD PRESSURE SUPPORT)
PREFILLED_SYRINGE | INTRAVENOUS | Status: DC | PRN
Start: 2017-06-07 — End: 2017-06-07
  Administered 2017-06-07: 80 ug via INTRAVENOUS

## 2017-06-07 MED ORDER — TRAMADOL HCL 50 MG PO TABS: 50.0000 mg | ORAL_TABLET | Freq: Four times a day (QID) | ORAL | Status: DC | PRN

## 2017-06-07 MED ORDER — SCOPOLAMINE 1 MG/3DAYS TD PT72
MEDICATED_PATCH | TRANSDERMAL | Status: AC
  Filled 2017-06-07: qty 1

## 2017-06-07 MED ORDER — BUPIVACAINE-EPINEPHRINE (PF) 0.25% -1:200000 IJ SOLN
INTRAMUSCULAR | Status: AC
  Filled 2017-06-07: qty 30

## 2017-06-07 MED ORDER — MIDAZOLAM HCL 2 MG/2ML IJ SOLN
INTRAMUSCULAR | Status: AC
  Filled 2017-06-07: qty 2

## 2017-06-07 MED ORDER — DEXAMETHASONE SODIUM PHOSPHATE 10 MG/ML IJ SOLN
INTRAMUSCULAR | Status: DC | PRN
  Administered 2017-06-07: 8 mg via INTRAVENOUS

## 2017-06-07 MED ORDER — MORPHINE SULFATE (PF) 2 MG/ML IV SOLN: 1.0000 mg | INTRAVENOUS | Status: DC | PRN

## 2017-06-07 MED ORDER — SCOPOLAMINE 1 MG/3DAYS TD PT72
1.0000 | MEDICATED_PATCH | TRANSDERMAL | Status: DC
  Administered 2017-06-07: 1.5 mg via TRANSDERMAL

## 2017-06-07 MED ORDER — LABETALOL HCL 5 MG/ML IV SOLN
10.0000 mg | INTRAVENOUS | Status: AC | PRN
  Administered 2017-06-07 (×2): 5 mg via INTRAVENOUS

## 2017-06-07 MED ORDER — SIMETHICONE 80 MG PO CHEW: 80.0000 mg | CHEWABLE_TABLET | Freq: Four times a day (QID) | ORAL | Status: DC | PRN

## 2017-06-07 MED FILL — oxyCODONE HCL 5 MG/5ML SOLN: 5 | 2 days supply | Qty: 120 | Fill #0

## 2017-06-07 SURGICAL SUPPLY — 60 items
APL SKNCLS STERI-STRIP NONHPOA (GAUZE/BANDAGES/DRESSINGS) ×1
APPLIER CLIP 5 13 M/L LIGAMAX5 (MISCELLANEOUS)
APPLIER CLIP ROT 13.4 12 LRG (CLIP)
APR CLP LRG 13.4X12 ROT 20 MLT (CLIP)
APR CLP MED LRG 5 ANG JAW (MISCELLANEOUS)
BAG LAPAROSCOPIC 12 15 PORT 16 (BASKET) ×1 IMPLANT
BAG RETRIEVAL 12/15 (BASKET) ×2
BANDAGE ADH SHEER 1  50/CT (GAUZE/BANDAGES/DRESSINGS) ×12 IMPLANT
BENZOIN TINCTURE PRP APPL 2/3 (GAUZE/BANDAGES/DRESSINGS) ×2 IMPLANT
BLADE SURG SZ11 CARB STEEL (BLADE) ×2 IMPLANT
CABLE HIGH FREQUENCY MONO STRZ (ELECTRODE) ×2 IMPLANT
CHLORAPREP W/TINT 26ML (MISCELLANEOUS) ×2 IMPLANT
CLIP APPLIE 5 13 M/L LIGAMAX5 (MISCELLANEOUS) IMPLANT
CLIP APPLIE ROT 13.4 12 LRG (CLIP) IMPLANT
COVER SURGICAL LIGHT HANDLE (MISCELLANEOUS) ×2 IMPLANT
DRAIN CHANNEL 19F RND (DRAIN) IMPLANT
DRAPE UNIVERSAL PACK (DRAPES) ×2 IMPLANT
ELECT REM PT RETURN 15FT ADLT (MISCELLANEOUS) ×2 IMPLANT
EVACUATOR SILICONE 100CC (DRAIN) IMPLANT
GAUZE SPONGE 4X4 12PLY STRL (GAUZE/BANDAGES/DRESSINGS) IMPLANT
GLOVE BIOGEL PI IND STRL 7.0 (GLOVE) ×1 IMPLANT
GLOVE BIOGEL PI INDICATOR 7.0 (GLOVE) ×1
GLOVE SURG SS PI 7.0 STRL IVOR (GLOVE) ×2 IMPLANT
GOWN STRL REUS W/TWL LRG LVL3 (GOWN DISPOSABLE) ×2 IMPLANT
GOWN STRL REUS W/TWL XL LVL3 (GOWN DISPOSABLE) ×6 IMPLANT
GRASPER SUT TROCAR 14GX15 (MISCELLANEOUS) ×2 IMPLANT
HANDLE STAPLE EGIA 4 XL (STAPLE) ×2 IMPLANT
HOVERMATT SINGLE USE (MISCELLANEOUS) ×2 IMPLANT
KIT BASIN OR (CUSTOM PROCEDURE TRAY) ×2 IMPLANT
MARKER SKIN DUAL TIP RULER LAB (MISCELLANEOUS) ×2 IMPLANT
NDL SPNL 22GX3.5 QUINCKE BK (NEEDLE) ×1 IMPLANT
NEEDLE SPNL 22GX3.5 QUINCKE BK (NEEDLE) ×2 IMPLANT
RELOAD EGIA 45 MED/THCK PURPLE (STAPLE) IMPLANT
RELOAD EGIA 60 MED/THCK PURPLE (STAPLE) ×8 IMPLANT
RELOAD EGIA BLACK ROTIC 45MM (STAPLE) IMPLANT
RELOAD STAPLE 45 BLK XTHK (STAPLE) IMPLANT
RELOAD STAPLE 60 BLK XTHK ART (STAPLE) ×2 IMPLANT
RELOAD STAPLE 60 MED/THCK ART (STAPLE) IMPLANT
RELOAD TRI 2.0 60 XTHK VAS SUL (STAPLE) ×4 IMPLANT
SCISSORS LAP 5X45 EPIX DISP (ENDOMECHANICALS) IMPLANT
SET IRRIG TUBING LAPAROSCOPIC (IRRIGATION / IRRIGATOR) ×2 IMPLANT
SHEARS HARMONIC ACE PLUS 45CM (MISCELLANEOUS) ×2 IMPLANT
SLEEVE GASTRECTOMY 40FR VISIGI (MISCELLANEOUS) ×2 IMPLANT
SLEEVE XCEL OPT CAN 5 100 (ENDOMECHANICALS) ×4 IMPLANT
SOLUTION ANTI FOG 6CC (MISCELLANEOUS) ×2 IMPLANT
SPONGE LAP 18X18 X RAY DECT (DISPOSABLE) ×2 IMPLANT
STRIP CLOSURE SKIN 1/2X4 (GAUZE/BANDAGES/DRESSINGS) ×2 IMPLANT
SUT ETHIBOND 0 36 GRN (SUTURE) IMPLANT
SUT ETHILON 2 0 PS N (SUTURE) IMPLANT
SUT MNCRL AB 4-0 PS2 18 (SUTURE) ×2 IMPLANT
SUT VICRYL 0 TIES 12 18 (SUTURE) ×2 IMPLANT
SYR 20CC LL (SYRINGE) ×2 IMPLANT
SYR 50ML LL SCALE MARK (SYRINGE) ×2 IMPLANT
TOWEL OR 17X26 10 PK STRL BLUE (TOWEL DISPOSABLE) ×2 IMPLANT
TOWEL OR NON WOVEN STRL DISP B (DISPOSABLE) ×2 IMPLANT
TROCAR BLADELESS 15MM (ENDOMECHANICALS) ×2 IMPLANT
TROCAR BLADELESS OPT 5 100 (ENDOMECHANICALS) ×2 IMPLANT
TUBING CONNECTING 10 (TUBING) ×2 IMPLANT
TUBING ENDO SMARTCAP PENTAX (MISCELLANEOUS) IMPLANT
TUBING INSUF HEATED (TUBING) ×2 IMPLANT

## 2017-06-07 NOTE — Progress Notes (Signed)
Pt has declined the use of CPAP QHS.  RT to monitor and assess as needed.  

## 2017-06-07 NOTE — Progress Notes (Signed)
Diastolic BP >173. Dr Kieth Brightly paged. Donne Hazel, RN

## 2017-06-07 NOTE — Op Note (Signed)
Procedure: Upper GI endoscopy  Description of procedure: Upper GI endoscopy is performed at the completion of laparoscopic sleeve gastrectomy by Dr.  Kieth Brightly.  The video endoscope was introduced into the upper esophagus and then passed to the EG junction at about 40 cm. The esophagus appeared slightly tortuous. The gastric sleeve was entered. The sleeve was tensely distended with air while the outlet was obstructed under saline irrigation by the operating surgeon. There was no evidence of leak. The staple line was intact and without bleeding. The scope was advanced to the antrum and pylorus visualized. There was no stricture or twisting or mucosal abnormality, and particularly no narrowing noted at the incisura.  The pouch was then desufflated and the scope withdrawn.  Edward Jolly MD, FACS  06/07/2017, 10:17 AM

## 2017-06-07 NOTE — H&P (Signed)
Valerie Carter is an 56 y.o. female.   Chief Complaint: obesity HPI: 56 yo female with long history of obesity and diabetes has now completed all requirements to undergo sleeve gastrectomy.  Past Medical History:  Diagnosis Date  . Back pain   . Carpal tunnel syndrome    while driving both hands  . Diabetes mellitus without complication ([place])    type 2  . DJD (degenerative joint disease)    both knees right worse than left  . Headache   . Hyperlipidemia   . Hypertension   . Intramural leiomyoma of uterus   . Knee pain   . Morbid obesity with BMI of 50.0-59.9, adult ([place])   . Vitamin D deficiency     Past Surgical History:  Procedure Laterality Date  . KNEE SURGERY Right 1999   torn cartlidge repair    Family History  Problem Relation Age of Onset  . Diabetes Sister   . Hypertension Sister   . Breast cancer Maternal Aunt   . Diabetes Maternal Aunt   . Diabetes Father    Social History:  reports that  has never smoked. she has never used smokeless tobacco. She reports that she does not drink alcohol or use drugs.  Allergies: No Known Allergies  Medications Prior to Admission  Medication Sig Dispense Refill  . Acetaminophen (TYLENOL ARTHRITIS EXT RELIEF PO) Take by mouth daily.    Marland Kitchen amLODipine (NORVASC) 5 MG tablet Take 5 mg by mouth at bedtime.     . Ascorbic Acid (VITAMIN C PO) Take 1 tablet by mouth daily.    . benazepril (LOTENSIN) 40 MG tablet TAKE 1 TABLET BY MOUTH ONCE DAILY (Patient taking differently: TAKE 1 TABLET BY MOUTH ONCE DAILY AT NIGHT.) 90 tablet 0  . Blood Glucose Monitoring Suppl (ONE TOUCH ULTRA SYSTEM KIT) W/DEVICE KIT 1 kit by Does not apply route once. 1 each 0  . Calcium Carbonate-Vit D-Min (CALCIUM 1200 PO) Take 1 tablet by mouth 2 (two) times daily.    . Cholecalciferol (VITAMIN D3) 5000 units TABS Take 5,000 Units by mouth daily.    . ferrous sulfate 325 (65 FE) MG tablet TAKE ONE TABLET BY MOUTH ONCE DAILY WITH BREAKFAST (Patient taking  differently: TAKE ONE TABLET BY MOUTH ONCE DAILY AS NEEDED WITH MENSTRATION ONLY) 30 tablet 11  . glipiZIDE (GLUCOTROL) 5 MG tablet TAKE ONE TABLET BY MOUTH IN THE MORNING AND ONE-HALF IN THE EVENING 45 tablet 5  . glucose blood test strip Use as instructed 100 each 12  . hydrochlorothiazide (HYDRODIURIL) 25 MG tablet TAKE ONE TABLET BY MOUTH ONCE DAILY (Patient taking differently: TAKE ONE TABLET BY MOUTH ONCE DAILY AT 6PM.) 90 tablet 0  . ibuprofen (ADVIL,MOTRIN) 200 MG tablet Take 800 mg by mouth every 8 (eight) hours as needed (for pain.).    Marland Kitchen metFORMIN (GLUCOPHAGE) 1000 MG tablet TAKE ONE TABLET BY MOUTH TWICE DAILY WITH MEALS 180 tablet 4  . Multiple Vitamin (MULTIVITAMIN WITH MINERALS) TABS tablet Take 1 tablet by mouth 2 (two) times daily.    . polyethylene glycol powder (GLYCOLAX/MIRALAX) powder Take 17 g daily as needed by mouth. (Patient taking differently: Take 17 g by mouth daily as needed (FOR CONSTIPATION.). ) 250 g 1  . vitamin B-12 (CYANOCOBALAMIN) 1000 MCG tablet Take 1,000 mcg by mouth daily.    Marland Kitchen aspirin EC 81 MG tablet Take 81 mg by mouth daily.    . OMEGA-3 FATTY ACIDS PO Take 1 capsule by mouth daily.     Marland Kitchen  TURMERIC PO Take 750 mg by mouth 2 (two) times daily.      Results for orders placed or performed during the hospital encounter of 06/07/17 (from the past 48 hour(s))  Pregnancy, urine STAT morning of surgery     Status: None   Collection Time: 06/07/17  7:31 AM  Result Value Ref Range   Preg Test, Ur NEGATIVE NEGATIVE    Comment:        THE SENSITIVITY OF THIS METHODOLOGY IS >20 mIU/mL.   Glucose, capillary     Status: Abnormal   Collection Time: 06/07/17  7:33 AM  Result Value Ref Range   Glucose-Capillary 118 (H) 65 - 99 mg/dL   Comment 1 Notify RN    No results found.  Review of Systems  Constitutional: Negative for chills and fever.  HENT: Negative for hearing loss.   Eyes: Negative for blurred vision and double vision.  Respiratory: Negative for  cough and hemoptysis.   Cardiovascular: Negative for chest pain and palpitations.  Gastrointestinal: Negative for abdominal pain, nausea and vomiting.  Genitourinary: Negative for dysuria and urgency.  Musculoskeletal: Negative for myalgias and neck pain.  Skin: Negative for itching and rash.  Neurological: Negative for dizziness, tingling and headaches.  Endo/Heme/Allergies: Does not bruise/bleed easily.  Psychiatric/Behavioral: Negative for depression and suicidal ideas.    Blood pressure (!) 161/94, pulse (!) 112, temperature 98 F (36.7 C), temperature source Oral, resp. rate 20, height 5' 4.5" (1.638 m), weight (!) 155.5 kg (342 lb 12.8 oz), SpO2 100 %. Physical Exam  Vitals reviewed. Constitutional: She is oriented to person, place, and time. She appears well-developed and well-nourished.  HENT:  Head: Normocephalic and atraumatic.  Eyes: Conjunctivae and EOM are normal. Pupils are equal, round, and reactive to light.  Neck: Normal range of motion. Neck supple.  Cardiovascular: Normal rate and regular rhythm.  Respiratory: Effort normal and breath sounds normal.  GI: Soft. Bowel sounds are normal. She exhibits no distension. There is no tenderness.  Musculoskeletal: Normal range of motion.  Neurological: She is alert and oriented to person, place, and time.  Skin: Skin is warm and dry.  Psychiatric: She has a normal mood and affect. Her behavior is normal.     Assessment/Plan 56 yo female with morbid obesity, HTN, DMII, HL presents for sleeve gastrectomy -ERAS protocol -lap sleeve gastrectomy  Mickeal Skinner, MD 06/07/2017, 8:12 AM

## 2017-06-07 NOTE — Discharge Instructions (Signed)
° ° ° °GASTRIC BYPASS/SLEEVE ° Home Care Instructions ° ° These instructions are to help you care for yourself when you go home. ° °Call: If you have any problems. °• Call [phone] and ask for the surgeon on call °• If you need immediate help, come to the ER at [facility].  °• Tell the ER staff that you are a new post-op gastric bypass or gastric sleeve patient °  °Signs and symptoms to report: • Severe vomiting or nausea °o If you cannot keep down clear liquids for longer than 1 day, call your surgeon  °• Abdominal pain that does not get better after taking your pain medication °• Fever over 100.4° F with chills °• Heart beating over 100 beats a minute °• Shortness of breath at rest °• Chest pain °•  Redness, swelling, drainage, or foul odor at incision (surgical) sites °•  If your incisions open or pull apart °• Swelling or pain in calf (lower leg) °• Diarrhea (Loose bowel movements that happen often), frequent watery, uncontrolled bowel movements °• Constipation, (no bowel movements for 3 days) if this happens: Pick one °o Milk of Magnesia, 2 tablespoons by mouth, 3 times a day for 2 days if needed °o Stop taking Milk of Magnesia once you have a bowel movement °o Call your doctor if constipation continues °Or °o Miralax  (instead of Milk of Magnesia) following the label instructions °o Stop taking Miralax once you have a bowel movement °o Call your doctor if constipation continues °• Anything you think is not normal °  °Normal side effects after surgery: • Unable to sleep at night or unable to focus °• Irritability or moody °• Being tearful (crying) or depressed °These are common complaints, possibly related to your anesthesia medications that put you to sleep, stress of surgery, and change in lifestyle.  This usually goes away a few weeks after surgery.  If these feelings continue, call your primary care doctor. °  °Wound Care: You may have surgical glue, steri-strips, or staples over your incisions after  surgery °• Surgical glue:  Looks like a clear film over your incisions and will wear off a little at a time °• Steri-strips: Strips of tape over your incisions. You may notice a yellowish color on the skin under the steri-strips. This is used to make the   steri-strips stick better. Do not pull the steri-strips off - let them fall off °• Staples: Staples may be removed before you leave the hospital °o If you go home with staples, call [facility], [phone] at for an appointment with your surgeon’s nurse to have staples removed 10 days after surgery. °• Showering: You may shower two (2) days after your surgery unless your surgeon tells you differently °o Wash gently around incisions with warm soapy water, rinse well, and gently pat dry  °o No tub baths until staples are removed, steri-strips fall off or glue is gone.  °  °Medications: • Medications should be liquid or crushed if larger than the size of a dime °• Extended release pills (medication that release a little bit at a time through the day) should NOT be crushed or cut. (examples include XL, ER, DR, SR) °• Depending on the size and number of medications you take, you may need to space (take a few throughout the day)/change the time you take your medications so that you do not over-fill your pouch (smaller stomach) °• Make sure you follow-up with your primary care doctor to   make medication changes needed during rapid weight loss and life-style changes °• If you have diabetes, follow up with the doctor that orders your diabetes medication(s) within one week after surgery and check your blood sugar regularly. °• Do not drive while taking prescription pain medication  °• It is ok to take Tylenol by the bottle instructions with your pain medicine or instead of your pain medicine as needed.  DO NOT TAKE NSAIDS (EXAMPLES OF NSAIDS:  IBUPROFREN/ NAPROXEN)  °Diet:                    First 2 Weeks ° You will see the dietician t about two (2) weeks  after your surgery. The dietician will increase the types of foods you can eat if you are handling liquids well: °• If you have severe vomiting or nausea and cannot keep down clear liquids lasting longer than 1 day, call your surgeon @ ([phone]) °Protein Shake °• Drink at least 2 ounces of shake 5-6 times per day °• Each serving of protein shakes (usually 8 - 12 ounces) should have: °o 15 grams of protein  °o And no more than 5 grams of carbohydrate  °• Goal for protein each day: °o Men = 80 grams per day °o Women = 60 grams per day °• Protein powder may be added to fluids such as non-fat milk or Lactaid milk or unsweetened Soy/Almond milk (limit to 35 grams added protein powder per serving) ° °Hydration °• Slowly increase the amount of water and other clear liquids as tolerated (See Acceptable Fluids) °• Slowly increase the amount of protein shake as tolerated  °•  Sip fluids slowly and throughout the day.  Do not use straws. °• May use sugar substitutes in small amounts (no more than 6 - 8 packets per day; i.e. Splenda) ° °Fluid Goal °• The first goal is to drink at least 8 ounces of protein shake/drink per day (or as directed by the nutritionist); some examples of protein shakes are Syntrax Nectar, Adkins Advantage, EAS Edge HP, and Unjury. See handout from pre-op Bariatric Education Class: °o Slowly increase the amount of protein shake you drink as tolerated °o You may find it easier to slowly sip shakes throughout the day °o It is important to get your proteins in first °• Your fluid goal is to drink 64 - 100 ounces of fluid daily °o It may take a few weeks to build up to this °• 32 oz (or more) should be clear liquids  °And  °• 32 oz (or more) should be full liquids (see below for examples) °• Liquids should not contain sugar, caffeine, or carbonation ° °Clear Liquids: °• Water or Sugar-free flavored water (i.e. Fruit H2O, Propel) °• Decaffeinated coffee or tea (sugar-free) °• Crystal Lite, Wyler’s Lite,  Minute Maid Lite °• Sugar-free Jell-O °• Bouillon or broth °• Sugar-free Popsicle:   *Less than 20 calories each; Limit 1 per day ° °Full Liquids: °Protein Shakes/Drinks + 2 choices per day of other full liquids °• Full liquids must be: °o No More Than 15 grams of Carbs per serving  °o No More Than 3 grams of Fat per serving °• Strained low-fat cream soup (except Cream of Potato or Tomato) °• Non-Fat milk °• Fat-free Lactaid Milk °• Unsweetened Soy Or Unsweetened Almond Milk °• Low Sugar yogurt (Dannon Lite & Fit, Greek yogurt; Oikos Triple Zero; Chobani Simply 100; Yoplait 100 calorie Greek - No Fruit on the Bottom) ° °  °Vitamins   and Minerals • Start 1 day after surgery unless otherwise directed by your surgeon °• 2 Chewable Bariatric Specific Multivitamin / Multimineral Supplement with iron (Example: Bariatric Advantage Multi EA) °• Chewable Calcium with Vitamin D-3 °(Example: 3 Chewable Calcium Plus 600 with Vitamin D-3) °o Take 500 mg three (3) times a day for a total of 1500 mg each day °o Do not take all 3 doses of calcium at one time as it may cause constipation, and you can only absorb 500 mg  at a time  °o Do not mix multivitamins containing iron with calcium supplements; take 2 hours apart °• Menstruating women and those with a history of anemia (a blood disease that causes weakness) may need extra iron °o Talk with your doctor to see if you need more iron °• Do not stop taking or change any vitamins or minerals until you talk to your dietitian or surgeon °• Your Dietitian and/or surgeon must approve all vitamin and mineral supplements °  °Activity and Exercise: Limit your physical activity as instructed by your doctor.  It is important to continue walking at home.  During this time, use these guidelines: °• Do not lift anything greater than ten (10) pounds for at least two (2) weeks °• Do not go back to work or drive until your surgeon says you can °• You may have sex when you feel comfortable  °o It is  VERY important for female patients to use a reliable birth control method; fertility often increases after surgery  °o All hormonal birth control will be ineffective for 30 days after surgery due to medications given during surgery a barrier method must be used. °o Do not get pregnant for at least 18 months °• Start exercising as soon as your doctor tells you that you can °o Make sure your doctor approves any physical activity °• Start with a simple walking program °• Walk 5-15 minutes each day, 7 days per week.  °• Slowly increase until you are walking 30-45 minutes per day °Consider joining our BELT program. [phone] or email [email or web address] °  °Special Instructions Things to remember: °• Use your CPAP when sleeping if this applies to you ° °• [facility] has two free Bariatric Surgery Support Groups that meet monthly °o The 3rd Thursday of each month, 6 pm, [facility]  °o The 2nd Friday of each month, 11:45 am in the private dining room in the basement of [facility] °• It is very important to keep all follow up appointments with your surgeon, dietitian, primary care physician, and behavioral health practitioner °• Routine follow up schedule with your surgeon include appointments at 2-3 weeks, 6-8 weeks, 6 months, and 1 year at a minimum.  Your surgeon may request to see you more often.   °o After the first year, please follow up with your bariatric surgeon and dietitian at least once a year in order to maintain best weight loss results °[facility]: [phone] °[facility]: [phone] °Bariatric Nurse Coordinator: [phone] °  °   Reviewed and Endorsed  °by [facility] Patient Education Committee, June, 2016 °Edits Approved: Aug, 2018 ° ° ° °

## 2017-06-07 NOTE — Transfer of Care (Signed)
Immediate Anesthesia Transfer of Care Note  Patient: Valerie Carter  Procedure(s) Performed: LAPAROSCOPIC GASTRIC SLEEVE RESECTION, UPPER ENDOSCOPY (N/A Abdomen)  Patient Location: PACU  Anesthesia Type:General  Level of Consciousness: awake, alert  and oriented  Airway & Oxygen Therapy: Patient Spontanous Breathing and Patient connected to face mask oxygen  Post-op Assessment: Report given to RN  Post vital signs: Reviewed and stable  Last Vitals:  Vitals:   06/07/17 0717  BP: (!) 161/94  Pulse: (!) 112  Resp: 20  Temp: 36.7 C  SpO2: 100%    Last Pain:  Vitals:   06/07/17 0754  TempSrc:   PainSc: 0-No pain      Patients Stated Pain Goal: 4 (21/82/88 3374)  Complications: No apparent anesthesia complications

## 2017-06-07 NOTE — Anesthesia Procedure Notes (Signed)
Procedure Name: Intubation Date/Time: 06/07/2017 8:51 AM Performed by: Lavina Hamman, CRNA Pre-anesthesia Checklist: Patient identified, Emergency Drugs available, Suction available, Patient being monitored and Timeout performed Patient Re-evaluated:Patient Re-evaluated prior to induction Oxygen Delivery Method: Circle system utilized Preoxygenation: Pre-oxygenation with 100% oxygen Induction Type: IV induction Ventilation: Mask ventilation without difficulty Laryngoscope Size: Mac and 4 Grade View: Grade II Tube type: Oral Tube size: 7.5 mm Number of attempts: 1 Airway Equipment and Method: Stylet Placement Confirmation: ETT inserted through vocal cords under direct vision,  positive ETCO2,  CO2 detector and breath sounds checked- equal and bilateral Secured at: 22 cm Tube secured with: Tape Dental Injury: Teeth and Oropharynx as per pre-operative assessment

## 2017-06-07 NOTE — Op Note (Signed)
Preop Diagnosis: Obesity Class III  Postop Diagnosis: same  Procedure performed: laparoscopic Sleeve Gastrectomy  Assitant: Adonis Housekeeper  Indications:  The patient is a 56 y.o. year-old morbidly obese female who has been followed in the Bariatric Clinic as an outpatient. This patient was diagnosed with morbid obesity with a BMI of Body mass index is 57.93 kg/m. and significant co-morbidities including hypertension and non-insulin dependent diabetes.  The patient was counseled extensively in the Bariatric Outpatient Clinic and after a thorough explanation of the risks and benefits of surgery (including death from complications, bowel leak, infection such as peritonitis and/or sepsis, internal hernia, bleeding, need for blood transfusion, bowel obstruction, organ failure, pulmonary embolus, deep venous thrombosis, wound infection, incisional hernia, skin breakdown, and others entailed on the consent form) and after a compliant diet and exercise program, the patient was scheduled for an elective laparoscopic sleeve gastrectomy.  Description of Operation:  Following informed consent, the patient was taken to the operating room and placed on the operating table in the supine position.  She had previously received prophylactic antibiotics and subcutaneous heparin for DVT prophylaxis in the pre-op holding area.  After induction of general endotracheal anesthesia by the anesthesiologist, the patient underwent placement of sequential compression devices, Foley catheter and an oro-gastric tube.  A timeout was confirmed by the surgery and anesthesia teams.  The patient was adequately padded at all pressure points and placed on a footboard to prevent slippage from the OR table during extremes of position during surgery.  She underwent a routine sterile prep and drape of her entire abdomen.    Next, A transverse incision was made under the left subcostal area and a 11mm optical viewing trocar was introduced into  the peritoneal cavity. Pneumoperitoneum was applied with a high flow and low pressure. A laparoscope was inserted to confirm placement. A extraperitoneal block was then placed at the lateral abdominal wall using exparel diluted with marcaine. 5 additional incisions were placed: 1 46mm trocar to the left of the midline. 1 additional 19mm trocar in the left lateral area, 1 54mm trocar in the right mid abdomen, 1 42mm trocar in the right subcostal area, and a Nathanson retractor was placed through a subxiphoid incision.  The fat pad at the GE junction was incised and the gastrodiaphragmatic ligament was divided using the Harmonic scalpel. Next, a hole was created through the lesser omentum along the greater curve of the stomach to enter the lesser sac. The vessels along the greater omentum were  Then ligated and divided using the Harmonic scalpel moving towards the spleen and then short gastric vessels were ligated and divided in the same fashion to fully mobilize the fundus. The left crus was identified to ensure completion of the dissection. Next the antrum was measured and dissection continued inferiorly along the greater curve towards the pylorus and stopped 6cm from the pylorus.   A 40Fr ViSiGi dilator was placed into the esophgaus and along the lesser curve of the stomach and placed on suction. 2 non-reinforced 51mm 4-98mm tristapler(s) followed by 4 34mm 3-80mm tristaplers were used to make the resection along the antrum being sure to stay well away from the angularis by angling the jaws of the stapler towards the greater curve and later completing the resection staying along the [place] and ensuring the fundus was not retained by appropriately retracting it lateral. Air was inserted through the [place] to perform a leak test showing no bubbles and a neutral lie of the stomach.  The assistant  then went and performed an upper endoscopy and leak test. No bubbles were seen and the sleeve and antrum distended  appropriately. The specimen was then placed in an endocatch bag and removed by the 26mm port. The fascia of the 87mm port was closed with a 0 vicryl by suture passer. Hemostasis was ensured. Pneumoperitoneum was evacuated, all ports were removed and all incisions closed with 4-0 monocryl suture in subcuticular fashion. Steristrips and bandaids were put in place for dressing. The patient awoke from anesthesia and was brought to pacu in stable condition. All counts were correct.  Estimated blood loss: <36ml  Specimens:  Sleeve gastrectomy  Local Anesthesia: 50 ml Exparel:0.5% Marcaine mix  Post-Op Plan:       Pain Management: PO, prn      Antibiotics: Prophylactic      Anticoagulation: Prophylactic, Starting now      Post Op Studies/Consults: Not applicable      Intended Discharge: within 48h      Intended Outpatient Follow-Up: Two Week      Intended Outpatient Studies: Not Applicable      Other: Not Applicable   Valerie Carter

## 2017-06-07 NOTE — Progress Notes (Signed)
Discussed post op day goals with patient including ambulation, IS, diet progression, pain, and nausea control.  Questions answered. 

## 2017-06-08 ENCOUNTER — Encounter (HOSPITAL_COMMUNITY): Payer: Self-pay | Admitting: General Surgery

## 2017-06-08 LAB — COMPREHENSIVE METABOLIC PANEL
ALBUMIN: 3.4 g/dL — AB (ref 3.5–5.0)
ALT: 30 U/L (ref 14–54)
AST: 36 U/L (ref 15–41)
Alkaline Phosphatase: 61 U/L (ref 38–126)
Anion gap: 9 (ref 5–15)
BUN: 16 mg/dL (ref 6–20)
CHLORIDE: 108 mmol/L (ref 101–111)
CO2: 23 mmol/L (ref 22–32)
CREATININE: 0.78 mg/dL (ref 0.44–1.00)
Calcium: 9.5 mg/dL (ref 8.9–10.3)
GFR calc Af Amer: >60 mL/min (ref >60)
GLUCOSE: 131 mg/dL — AB (ref 65–99)
POTASSIUM: 3.8 mmol/L (ref 3.5–5.1)
Sodium: 140 mmol/L (ref 135–145)
Total Bilirubin: 0.6 mg/dL (ref 0.3–1.2)
Total Protein: 8.1 g/dL (ref 6.5–8.1)

## 2017-06-08 LAB — CBC WITH DIFFERENTIAL/PLATELET
BASOS PCT: 0 %
Basophils Absolute: 0 10*3/uL (ref 0.0–0.1)
Eosinophils Absolute: 0 10*3/uL (ref 0.0–0.7)
Eosinophils Relative: 0 %
HEMATOCRIT: 37.5 % (ref 36.0–46.0)
Hemoglobin: 11.5 g/dL — ABNORMAL LOW (ref 12.0–15.0)
LYMPHS PCT: 14 %
Lymphs Abs: 1.8 10*3/uL (ref 0.7–4.0)
MCH: 23.2 pg — ABNORMAL LOW (ref 26.0–34.0)
MCHC: 30.7 g/dL (ref 30.0–36.0)
MCV: 75.6 fL — AB (ref 78.0–100.0)
Monocytes Absolute: 1.2 10*3/uL — ABNORMAL HIGH (ref 0.1–1.0)
Monocytes Relative: 10 %
NEUTROS ABS: 9.2 10*3/uL — AB (ref 1.7–7.7)
Neutrophils Relative %: 76 %
PLATELETS: 289 10*3/uL (ref 150–400)
RBC: 4.96 MIL/uL (ref 3.87–5.11)
RDW: 16.3 % — ABNORMAL HIGH (ref 11.5–15.5)
WBC: 12.2 10*3/uL — AB (ref 4.0–10.5)

## 2017-06-08 LAB — GLUCOSE, CAPILLARY
GLUCOSE-CAPILLARY: 118 mg/dL — AB (ref 65–99)
Glucose-Capillary: 115 mg/dL — ABNORMAL HIGH (ref 65–99)

## 2017-06-08 MED ORDER — PANTOPRAZOLE SODIUM 40 MG PO TBEC: 40.0000 mg | DELAYED_RELEASE_TABLET | Freq: Every day | ORAL | 0 refills | Status: DC

## 2017-06-08 MED ORDER — ONDANSETRON 4 MG PO TBDP: 4.0000 mg | ORAL_TABLET | Freq: Three times a day (TID) | ORAL | 0 refills | Status: DC | PRN

## 2017-06-08 NOTE — Progress Notes (Signed)
Patient alert and oriented, Post op day 1.  Provided support and encouragement.  Encouraged pulmonary toilet, ambulation and small sips of liquids.  Patient completed 12 ounces of clear fluids overnight and 4 ounces of protein shake without difficulty this am.  All questions answered.  Will continue to monitor.

## 2017-06-08 NOTE — Progress Notes (Signed)
Pt alert, oriented, tolerating diet, and ambulating.  D/C instructions were given and all questions answered. Pt was d/cd to home.

## 2017-06-08 NOTE — Discharge Summary (Signed)
Physician Discharge Summary  Valerie Carter TXH:[id number] DOB: 24-Jan-1961 DOA: 06/07/2017  PCP: Shirley, [place], DO  Admit date: 06/07/2017 Discharge date: 06/08/2017  Recommendations for Outpatient Follow-up:  1. (include homehealth, outpatient follow-up instructions, specific recommendations for PCP to follow-up on, etc.)  Follow-up Information    Anjalina Bergevin, Arta Bruce, MD. Go on 06/22/2017.   Specialty:  General Surgery Why:  at [address] information: [place] [phone]        Junette Bernat, Arta Bruce, MD Follow up.   Specialty:  General Surgery Contact information: [place] [address] [phone]          Discharge Diagnoses:  Active Problems:   Morbid obesity ([place])   Surgical Procedure: Laparoscopic Sleeve Gastrectomy, upper endoscopy  Discharge Condition: Good Disposition: Home  Diet recommendation: Postoperative sleeve gastrectomy diet (liquids only)  Filed Weights   06/07/17 0717  Weight: (!) 155.5 kg (342 lb 12.8 oz)     Hospital Course:  The patient was admitted after undergoing laparoscopic sleeve gastrectomy. POD 0 she ambulated well. POD 1 she was started on the water diet protocol and tolerated 500 ml in the first shift. Once meeting the water amount she was advanced to bariatric protein shakes which they tolerated and were discharged home POD 1.  Treatments: surgery: laparoscopic sleeve gastrectomy  Discharge Instructions  Discharge Instructions    Ambulate hourly while awake   Complete by:  As directed    Call MD for:  difficulty breathing, headache or visual disturbances   Complete by:  As directed    Call MD for:  persistant dizziness or light-headedness   Complete by:  As directed    Call MD for:  persistant nausea and vomiting   Complete by:  As directed    Call MD for:  redness, tenderness, or signs of infection (pain, swelling, redness, odor or green/yellow  discharge around incision site)   Complete by:  As directed    Call MD for:  severe uncontrolled pain   Complete by:  As directed    Call MD for:  temperature >101 F   Complete by:  As directed    Diet bariatric full liquid   Complete by:  As directed    Discharge wound care:   Complete by:  As directed    Remove Bandaids tomorrow, ok to shower tomorrow. Steristrips may fall off in 1-3 weeks.   Incentive spirometry   Complete by:  As directed    Perform hourly while awake     Allergies as of 06/08/2017   No Known Allergies     Medication List    STOP taking these medications   hydrochlorothiazide 25 MG tablet Commonly known as:  HYDRODIURIL   ibuprofen 200 MG tablet Commonly known as:  ADVIL,MOTRIN     TAKE these medications   amLODipine 5 MG tablet Commonly known as:  NORVASC Take 5 mg by mouth at bedtime. Notes to patient:  Monitor Blood Pressure Daily and keep a log for primary care physician.  You may need to make changes to your medications with rapid weight loss.     aspirin EC 81 MG tablet Take 81 mg by mouth daily.   benazepril 40 MG tablet Commonly known as:  LOTENSIN TAKE 1 TABLET BY MOUTH ONCE DAILY What changed:    how much to take  how to take this  when to take this   CALCIUM 1200 PO Take 1  tablet by mouth 2 (two) times daily.   ferrous sulfate 325 (65 FE) MG tablet TAKE ONE TABLET BY MOUTH ONCE DAILY WITH BREAKFAST What changed:  See the new instructions.   glipiZIDE 5 MG tablet Commonly known as:  GLUCOTROL TAKE ONE TABLET BY MOUTH IN THE MORNING AND ONE-HALF IN THE EVENING Notes to patient:  Monitor Blood Sugar Frequently and keep a log for primary care physician, you may need to adjust medication dosage with rapid weight loss.     glucose blood test strip Use as instructed   metFORMIN 1000 MG tablet Commonly known as:  GLUCOPHAGE TAKE ONE TABLET BY MOUTH TWICE DAILY WITH MEALS Notes to patient:  Monitor Blood Sugar Frequently and  keep a log for primary care physician, you may need to adjust medication dosage with rapid weight loss.     multivitamin with minerals Tabs tablet Take 1 tablet by mouth 2 (two) times daily.   OMEGA-3 FATTY ACIDS PO Take 1 capsule by mouth daily.   ondansetron 4 MG disintegrating tablet Commonly known as:  ZOFRAN ODT Take 1 tablet (4 mg total) by mouth every 8 (eight) hours as needed for nausea or vomiting.   ONE TOUCH ULTRA SYSTEM KIT w/Device Kit 1 kit by Does not apply route once.   pantoprazole 40 MG tablet Commonly known as:  PROTONIX Take 1 tablet (40 mg total) by mouth daily for 60 doses.   polyethylene glycol powder powder Commonly known as:  GLYCOLAX/MIRALAX Take 17 g daily as needed by mouth. What changed:  reasons to take this   TURMERIC PO Take 750 mg by mouth 2 (two) times daily.   TYLENOL ARTHRITIS EXT RELIEF PO Take by mouth daily.   vitamin B-12 1000 MCG tablet Commonly known as:  CYANOCOBALAMIN Take 1,000 mcg by mouth daily.   VITAMIN C PO Take 1 tablet by mouth daily.   Vitamin D3 5000 units Tabs Take 5,000 Units by mouth daily.            Discharge Care Instructions  (From admission, onward)        Start     Ordered   06/08/17 0000  Discharge wound care:    Comments:  Remove Bandaids tomorrow, ok to shower tomorrow. Steristrips may fall off in 1-3 weeks.   06/08/17 1520     Follow-up Information    Jalayah Gutridge, Arta Bruce, MD. Go on 06/22/2017.   Specialty:  General Surgery Why:  at [address] information: [place] [address] [phone]        Clydene Burack, Arta Bruce, MD Follow up.   Specialty:  General Surgery Contact information: [place] [address] [phone]            The results of significant diagnostics from this hospitalization (including imaging, microbiology, ancillary and laboratory) are listed below for reference.    Significant Diagnostic  Studies: No results found.  Labs: Basic Metabolic Panel: Recent Labs  Lab 06/08/17 0612  NA 140  K 3.8  CL 108  CO2 23  GLUCOSE 131*  BUN 16  CREATININE 0.78  CALCIUM 9.5   Liver Function Tests: Recent Labs  Lab 06/08/17 0612  AST 36  ALT 30  ALKPHOS 61  BILITOT 0.6  PROT 8.1  ALBUMIN 3.4*    CBC: Recent Labs  Lab 06/07/17 1046 06/08/17 0612  WBC  --  12.2*  NEUTROABS  --  9.2*  HGB 12.5 11.5*  HCT  40.6 37.5  MCV  --  75.6*  PLT  --  289    CBG: Recent Labs  Lab 06/07/17 0733 06/07/17 1017 06/07/17 1633 06/08/17 0740 06/08/17 1146  GLUCAP 118* 202* 224* 118* 115*    Active Problems:   Morbid obesity ([place])   Time coordinating discharge: 24mn

## 2017-06-09 LAB — GLUCOSE, CAPILLARY: GLUCOSE-CAPILLARY: 113 mg/dL — AB (ref 65–99)

## 2017-06-10 ENCOUNTER — Telehealth (HOSPITAL_COMMUNITY): Payer: Self-pay

## 2017-06-10 NOTE — Anesthesia Preprocedure Evaluation (Addendum)
Anesthesia Evaluation  Patient identified by MRN, date of birth, ID band Patient awake    Reviewed: Allergy & Precautions, H&P , Patient's Chart, lab work & pertinent test results, reviewed documented beta blocker date and time   Airway Mallampati: III  TM Distance: >3 FB Neck ROM: full    Dental no notable dental hx.    Pulmonary    Pulmonary exam normal breath sounds clear to auscultation       Cardiovascular hypertension,  Rhythm:regular Rate:Normal     Neuro/Psych    GI/Hepatic   Endo/Other  diabetesMorbid obesity  Renal/GU      Musculoskeletal   Abdominal   Peds  Hematology   Anesthesia Other Findings   Reproductive/Obstetrics                            Anesthesia Physical Anesthesia Plan  ASA: III  Anesthesia Plan: General   Post-op Pain Management:    Induction: Intravenous  PONV Risk Score and Plan: 2 and Treatment may vary due to age or medical condition and Dexamethasone  Airway Management Planned: Oral ETT  Additional Equipment:   Intra-op Plan:   Post-operative Plan: Extubation in OR  Informed Consent: I have reviewed the patients History and Physical, chart, labs and discussed the procedure including the risks, benefits and alternatives for the proposed anesthesia with the patient or authorized representative who has indicated his/her understanding and acceptance.   Dental Advisory Given  Plan Discussed with: CRNA and Surgeon  Anesthesia Plan Comments: (  )       Anesthesia Quick Evaluation

## 2017-06-10 NOTE — Anesthesia Postprocedure Evaluation (Signed)
Anesthesia Post Note  Patient: Valerie Carter  Procedure(s) Performed: LAPAROSCOPIC GASTRIC SLEEVE RESECTION, UPPER ENDOSCOPY (N/A Abdomen)     Patient location during evaluation: PACU Anesthesia Type: General Level of consciousness: awake and alert Pain management: pain level controlled Vital Signs Assessment: post-procedure vital signs reviewed and stable Respiratory status: spontaneous breathing, nonlabored ventilation, respiratory function stable and patient connected to nasal cannula oxygen Cardiovascular status: blood pressure returned to baseline and stable Postop Assessment: no apparent nausea or vomiting Anesthetic complications: no    Last Vitals:  Vitals:   06/08/17 1026 06/08/17 1345  BP: 133/79 129/84  Pulse: 78 89  Resp: 16 18  Temp: 36.9 C 36.8 C  SpO2: 100% 97%    Last Pain:  Vitals:   06/08/17 1635  TempSrc:   PainSc: 6                  Treina Arscott EDWARD

## 2017-06-10 NOTE — Telephone Encounter (Addendum)
Follow up with bariatric surgical patient to discuss post discharge questions.  No answer at this time voicemail left for patient along with contact information to discuss the following questions.  1.  Are you having any pain not relieved by pain medication?pain is controlled  2.  How much fluid total fluid intake have you had in the last 24/48 hours?  54 ounces of fluid  3.  How much protein intake have you had in the last 24/48 hours?45 grams of protein  4.  Have you had any trouble making urine?no trouble with urine  5.  Have you had nausea that has not been relieved by nausea medication?no nausea medication  6.  Are you ambulating every hour?yes  7.  Are you passing gas or had a BM?had BM  8.  Do you know how to contact [place]? [facility]? [facility]?yes  9.  Are you taking your vitamins and calcium without difficulty?yes   10. Tell me how your incision looks?  Any redness, open incision, or drainage?no problems

## 2017-06-14 ENCOUNTER — Encounter (HOSPITAL_COMMUNITY): Payer: Self-pay | Admitting: General Surgery

## 2017-06-22 ENCOUNTER — Other Ambulatory Visit: Payer: Self-pay

## 2017-06-22 ENCOUNTER — Ambulatory Visit: Payer: BLUE CROSS/BLUE SHIELD | Admitting: Internal Medicine

## 2017-06-22 ENCOUNTER — Encounter: Payer: Self-pay | Admitting: Internal Medicine

## 2017-06-22 DIAGNOSIS — Z9884 Bariatric surgery status: Secondary | ICD-10-CM | POA: Diagnosis not present

## 2017-06-22 DIAGNOSIS — I10 Essential (primary) hypertension: Secondary | ICD-10-CM

## 2017-06-22 DIAGNOSIS — E119 Type 2 diabetes mellitus without complications: Secondary | ICD-10-CM | POA: Diagnosis not present

## 2017-06-22 MED ORDER — METFORMIN HCL 1000 MG PO TABS: 500.0000 mg | ORAL_TABLET | Freq: Every day | ORAL | 0 refills | Status: DC

## 2017-06-22 MED ORDER — BENAZEPRIL HCL 20 MG PO TABS: 20.0000 mg | ORAL_TABLET | Freq: Every day | ORAL | 0 refills | Status: DC

## 2017-06-22 NOTE — Progress Notes (Signed)
   [facility] Phone: [phone]  Subjective:  Valerie Carter is a 56 year old female presenting to clinic for follow-up after her laparoscopic sleeve gastrectomy on 06/07/17. She has been doing well since then. Her weight has decreased from 360lb to 329lb. She has been drinking a lot of water and muscle milk, per surgery recommendations. She has a follow-up appointment with surgery today and has nutrition follow-up tomorrow.  HTN: She was told to stop HCTZ prior to surgery, so she has not been taking this. She also stopped taking the Norvasc. She is still taking Benazepril 40mg  daily. Checking blood pressures daily at home. They have all been in the 366Y systolic. No chest pain, no shortness of breath, no lower extremity edema.  T2DM: Has stopped taking the Glipizide and has cut back on her Metformin to 500mg  daily (previously taking 1000mg  bid). Checking CBGs every morning. Have been ranging from 100-111. No lows, no polyuria, no polydipsia  ROS: See HPI for pertinent positives and negatives  Past Medical History- HTN, OSA, T2DM, HLD, depression, morbid obesity s/p laparoscopic sleeve gastrectomy 06/07/17.   Family history reviewed for today's visit. No changes.  Social history- patient is a never smoker  Objective: BP 118/80   Pulse 100   Temp 98.1 F (36.7 C) (Oral)   Wt (!) 329 lb (149.2 kg)   SpO2 99%   BMI 55.60 kg/m  Gen: NAD, alert, cooperative with exam HEENT: NCAT, EOMI, MMM Neck: FROM, supple CV: RRR, no murmur Resp: CTABL, no wheezes, normal work of breathing Msk: No edema  Assessment/Plan: T2DM: Well-controlled. Last A1c 05/02/17 was 6.7%. CBGs improving after sleeve gastrectomy. Patient has lost 31lb in 2 weeks. - Stop Glipizide - Decrease Metformin from 1000mg  bid to 500mg  daily - Continue checking blood sugars every morning - Follow-up with PCP in 1 month  HTN: Well-controlled. BP 118/80 in clinic today. BPs improving after sleeve  gastrectomy. Patient has lost 31lb in 2 weeks. Patient took herself off the Norvasc. - Stop Norvasc and HCTZ - Decrease Benazepril dose from 40mg  daily to 20mg  daily - Continue checking blood pressures at home regularly. - Follow-up with PCP in 1 month   Hyman Bible, MD PGY-3

## 2017-06-22 NOTE — Patient Instructions (Signed)
It was so nice to see you!  Congratulations on your weight loss! I'm glad the surgery went well.  For your blood pressure- STOP the HCTZ and Norvasc. We will cut the Benazepril dose in half, so you should take 20mg  daily. Keep checking your blood pressure regularly.  For your diabetes- STOP the glipizide. You should take the Metformin 500mg  daily. Keep checking your blood sugar every day.  Please follow-up with your primary care doctor in 1 month.  -Dr. Brett Albino

## 2017-06-23 ENCOUNTER — Encounter: Payer: BLUE CROSS/BLUE SHIELD | Attending: General Surgery | Admitting: Skilled Nursing Facility1

## 2017-06-23 DIAGNOSIS — Z713 Dietary counseling and surveillance: Secondary | ICD-10-CM | POA: Insufficient documentation

## 2017-06-23 DIAGNOSIS — E119 Type 2 diabetes mellitus without complications: Secondary | ICD-10-CM

## 2017-06-23 DIAGNOSIS — Z9884 Bariatric surgery status: Secondary | ICD-10-CM | POA: Insufficient documentation

## 2017-06-23 NOTE — Assessment & Plan Note (Signed)
Well-controlled. Last A1c 05/02/17 was 6.7%. CBGs improving after sleeve gastrectomy. Patient has lost 31lb in 2 weeks. - Stop Glipizide - Decrease Metformin from 1000mg  bid to 500mg  daily - Continue checking blood sugars every morning - Follow-up with PCP in 1 month

## 2017-06-23 NOTE — Assessment & Plan Note (Signed)
Well-controlled. BP 118/80 in clinic today. BPs improving after sleeve gastrectomy. Patient has lost 31lb in 2 weeks. Patient took herself off the Norvasc. - Stop Norvasc and HCTZ - Decrease Benazepril dose from 40mg  daily to 20mg  daily - Continue checking blood pressures at home regularly. - Follow-up with PCP in 1 month

## 2017-06-27 ENCOUNTER — Encounter: Payer: Self-pay | Admitting: Skilled Nursing Facility1

## 2017-06-27 NOTE — Progress Notes (Signed)
Bariatric Class:  Appt start time: 1530 end time:  1630.  2 Week Post-Operative Nutrition Class  Patient was seen on 06/23/2017 for Post-Operative Nutrition [facility]   Checking blood sugar 2 times a day: around 110 and A1C 6.7  Surgery date: 06/07/2017 Surgery type: Sleeve Start weight at [facility]: 360 Weight today: pt declined wt  TANITA  BODY COMP RESULTS  declined   BMI (kg/m^2)    Fat Mass (lbs)    Fat Free Mass (lbs)    Total Body Water (lbs)    The following the learning objectives were met by the patient during this course:  Identifies Phase 3A (Soft, High Proteins) Dietary Goals and will begin from 2 weeks post-operatively to 2 months post-operatively  Identifies appropriate sources of fluids and proteins   States protein recommendations and appropriate sources post-operatively  Identifies the need for appropriate texture modifications, mastication, and bite sizes when consuming solids  Identifies appropriate multivitamin and calcium sources post-operatively  Describes the need for physical activity post-operatively and will follow MD recommendations  States when to call healthcare provider regarding medication questions or post-operative complications  Handouts given during class include:  Phase 3A: Soft, High Protein Diet Handout  Follow-Up Plan: Patient will follow-up at [facility] in 6 weeks for 2 month post-op nutrition visit for diet advancement per MD.

## 2017-07-22 DIAGNOSIS — R69 Illness, unspecified: Secondary | ICD-10-CM | POA: Diagnosis not present

## 2017-07-22 DIAGNOSIS — K912 Postsurgical malabsorption, not elsewhere classified: Secondary | ICD-10-CM | POA: Diagnosis not present

## 2017-07-22 DIAGNOSIS — Z9884 Bariatric surgery status: Secondary | ICD-10-CM | POA: Diagnosis not present

## 2017-07-22 DIAGNOSIS — E669 Obesity, unspecified: Secondary | ICD-10-CM | POA: Diagnosis not present

## 2017-08-04 ENCOUNTER — Encounter: Payer: Self-pay | Admitting: Registered"

## 2017-08-04 ENCOUNTER — Encounter: Payer: BLUE CROSS/BLUE SHIELD | Attending: General Surgery | Admitting: Registered"

## 2017-08-04 DIAGNOSIS — Z713 Dietary counseling and surveillance: Secondary | ICD-10-CM | POA: Diagnosis not present

## 2017-08-04 DIAGNOSIS — E119 Type 2 diabetes mellitus without complications: Secondary | ICD-10-CM

## 2017-08-04 NOTE — Progress Notes (Signed)
Follow-up visit:  8 Weeks Post-Operative Sleeve gastretomy Surgery  Medical Nutrition Therapy:  Appt start time: 11:15 end time:  12:00.  Primary concerns today: Post-operative Bariatric Surgery Nutrition Management.  Non scale victories: clothes fitting better, less fatigue, reduced diabetes medications, blood sugar numbers are great  Surgery date: 06/07/2017 Surgery type: Sleeve gastrectomy Start weight at [facility]: 360.8 lbs Weight today: 317.0 lbs Weight change:  lbs Total weight lost: 43.8 lbs Weight loss goal: alleviate some knee and back pain   TANITA  BODY COMP RESULTS  06/23/2017 08/04/2017   BMI (kg/m^2) Pt declined Pt declined   Fat Mass (lbs)     Fat Free Mass (lbs)     Total Body Water (lbs)      Pt arrives with friend and states she still has a lot of pain in knees and sciatic nerve. Pt states she was weighing daily and has reduced to weighing once a week. Pt states she feels like she is not doing something right because her weight has remained the same for the past 3 weeks. Pt states her BS numbers have improved: FBS (97-137). Pt states her recent A1c was 6.1. Pt states she has bowel movement every other day and she does not have to take a supplement. Pt reports logging consistently in [employer]. Pt states she has been taking Centrum Women's multivitamin.    Preferred Learning Style:   No preference indicated   Learning Readiness:   Ready  Change in progress  24-hr recall: B (AM): greek yogurt (15g),  Snk (AM): cheese puffs  L (PM): liver pudding sandwich (8g) Snk (PM): [place] link sausage (6g)  D (PM):  [place] breast (26g), baked beans Snk (PM): none  Fluid intake: water with flavor packs (6-16.9 oz bottles), Powerade zero; 64+ ounces Estimated total protein intake: ~60 grams a day  Medications: See list Supplementation: 2 Centrum + 3 Viactiv   CBG monitoring: yes Average CBG per patient: 97-137 Last patient reported A1c: 6.1 (07/22/2017)  Using  straws: no Drinking while eating: no Having you been chewing well: no Chewing/swallowing difficulties:  Sometimes when eating too much Changes in vision: no Changes to mood/headaches: no Hair loss/Changes to skin/Changes to nails: no, no, no Any difficulty focusing or concentrating: no Sweating: no Dizziness/Lightheaded: no Palpitations: once, spoke with surgeon; states she had them sometimes before surgery  Carbonated beverages: no N/V/D/C/GAS: no, no, no, no, no Abdominal Pain: no Dumping syndrome: no Last Lap-Band fill: N/A  Recent physical activity:  None, due to pain  Progress Towards Goal(s):  In progress.  Handouts given during visit include:  Phase IV: High Protein + NS vegetables   Nutritional Diagnosis:  [place]-3.3 Overweight/obesity related to past poor dietary habits and physical inactivity as evidenced by patient w/ recent sleeve gastrectomy surgery following dietary guidelines for continued weight loss.     Intervention:  Nutrition education and counseling.  Goals:  Follow Phase 3B: High Protein + Non-Starchy Vegetables  Eat 3-6 small meals/snacks, every 3-5 hrs  Increase lean protein foods to meet 60g goal  Increase fluid intake to 64oz +  Avoid drinking 15 minutes before, during and 30 minutes after eating  Aim for >30 min of physical activity daily   - Aim to be intentional on weekends with meeting protein goals.  - Create physical activity regimen of at least 30 min, 2x/week.  - Try [employer] multivitamin. Order if you like it.  - Continue to aim not weight daily. Focus more on health and  how you're feeling.   Teaching Method Utilized:  Visual Auditory Hands on  Barriers to learning/adherence to lifestyle change: none identified  Demonstrated degree of understanding via:  Teach Back   Monitoring/Evaluation:  Dietary intake, exercise, lap band fills, and body weight. Follow up in 3 months for 5 month post-op visit.

## 2017-08-04 NOTE — Patient Instructions (Addendum)
Goals:  Follow Phase 3B: High Protein + Non-Starchy Vegetables  Eat 3-6 small meals/snacks, every 3-5 hrs  Increase lean protein foods to meet 60g goal  Increase fluid intake to 64oz +  Avoid drinking 15 minutes before, during and 30 minutes after eating  Aim for >30 min of physical activity daily   - Aim to be intentional on weekends with meeting protein goals.   - Create physical activity regimen of at least 30 min, 2x/week.   - Try [employer] multivitamin. Order if you like it.   - Continue to aim not weight daily. Focus more on health and how you're feeling.

## 2017-08-08 ENCOUNTER — Encounter: Payer: Self-pay | Admitting: Family Medicine

## 2017-08-08 ENCOUNTER — Ambulatory Visit: Payer: BLUE CROSS/BLUE SHIELD | Admitting: Family Medicine

## 2017-08-08 ENCOUNTER — Other Ambulatory Visit: Payer: Self-pay

## 2017-08-08 VITALS — BP 138/78 | HR 107 | Temp 98.3°F | Ht 65.0 in | Wt 313.0 lb

## 2017-08-08 DIAGNOSIS — M170 Bilateral primary osteoarthritis of knee: Secondary | ICD-10-CM | POA: Diagnosis not present

## 2017-08-08 DIAGNOSIS — E119 Type 2 diabetes mellitus without complications: Secondary | ICD-10-CM

## 2017-08-08 DIAGNOSIS — Z9884 Bariatric surgery status: Secondary | ICD-10-CM | POA: Diagnosis not present

## 2017-08-08 DIAGNOSIS — I10 Essential (primary) hypertension: Secondary | ICD-10-CM

## 2017-08-08 MED ORDER — DULOXETINE HCL 30 MG PO CPEP: 30.0000 mg | ORAL_CAPSULE | Freq: Every day | ORAL | 3 refills | Status: DC

## 2017-08-08 MED ORDER — METHYLPREDNISOLONE ACETATE 80 MG/ML IJ SUSP
160.0000 mg | Freq: Once | INTRAMUSCULAR | Status: AC
  Administered 2017-08-08: 160 mg via INTRA_ARTICULAR

## 2017-08-08 NOTE — Patient Instructions (Addendum)
Thank you for coming to see me today. It was a pleasure! Today we talked about:   Your joint pain. I have put in a referral to the sport medicine clinic for an injection of your left knee. Your right knee should be better after the injection we performed. Please continue alternating ibuprofen and tylenol as needed for pain.  Your low back pain. I have started you on Duloxetine, which may also help with your osteoarthritis. Please take 30 mg once daily for 1 week, then increase to 60 mg once daily as tolerated.  Please continue your 500 mg of metformin daily for 10 days.   Please follow-up with me in 2 months or sooner as needed. Please continue checking your blood pressure and blood glucose at home.   If you have any questions or concerns, please do not hesitate to call the office at [phone].  Take Care,   Martinique Bijon Mineer, DO

## 2017-08-08 NOTE — Progress Notes (Signed)
Subjective:    Patient ID: Valerie Carter, female    DOB: 07-10-1960, 57 y.o.   MRN: [id number]   CC:  Annmargaret is a 57 year old female presenting to clinic for follow-up after her laparoscopic sleeve gastrectomy on 06/07/17. She has been doing well since then. Her weight has decreased from 360lb to 313lb. She has been graduated to fruits and vegetables with high protein diet, with no carbs and is doing well with this. Exercising has not started yet, but she has a gym membership that's he hopes to start going to next month when her schedule fits the gym's better. She has a follow-up appointment with nutrition 11/03/2017.  Iron and Vit D low at surgeon- started taking supplements.  HPI:  HTN: - She was told to stop HCTZ and Norvasc at last visit - Decreased Benazepril dose from 40mg  daily to 20mg  daily at last visit - Checking blood pressures daily at home. They have all been in the 536U systolic, with the highest recorded at 145 when she had just walked a long way. - No chest pain, no shortness of breath, no lower extremity edema.  T2DM: Last A1c 05/02/17 was 6.7%. Reports surgeon checked it 07/22/17 and it was 6.1% at recent visit- will collect results from surgeon's office - Has stopped taking the Glipizide - Metformin to 500mg  daily (previously taking 1000mg  bid).  - Checking CBGs every morning. Have been ranging from 97-137 (once) usually in 110's - No low blood glucoses, no polyuria, no polydipsia  OA of BL Knees: - Patient has had long-term issues, has not had injections since 11/2016.  - Pain is worsening rather than improving with weight loss - Continues to use tylenol and ibuprofen as needed - Having issues walking around stores - Does not feel as if her knees are slipping, but just painful - Reports her medication is not helping with the pain as it once did - Using diclofenac as well- sometimes helps  Past Medical History- HTN, OSA, T2DM, HLD, depression, morbid obesity s/p  laparoscopic sleeve gastrectomy 06/07/17.   Smoking status reviewed  Review of Systems Per HPI, also denies recent illness, fever, headache, changes in vision, chest pain, shortness of breath, abdominal pain, N/V/D, weakness   Patient Active Problem List   Diagnosis Date Noted  . 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 Morbid obesity ([place]) 06/07/2017  . Intramural leiomyoma of uterus 01/27/2016  . Mild depression ([place]) 09/25/2014  . Vitamin D deficiency 10/19/2013  . Seasonal allergies 09/07/2011  . 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 Diabetes mellitus, stable ([place]) 05/20/2010  . Low back pain 01/29/2010  . Hyperlipidemia LDL goal <70 10/01/2008  . Obesity 10/24/2006  . HYPERTENSION, BENIGN ESSENTIAL 09/26/2006     Objective:  BP 138/78   Pulse (!) 107   Temp 98.3 F (36.8 C) (Oral)   Ht 5\' 5"  (1.651 m)   Wt (!) 313 lb (142 kg)   SpO2 98%   BMI 52.09 kg/m  Vitals and nursing note reviewed  General: NAD, pleasant Cardiac: RRR, normal heart sounds, no murmurs Respiratory: CTAB, normal effort Abdomen: soft, nontender, nondistended. Bowel sounds present Extremities: no edema or cyanosis. WWP. Skin: warm and dry, no rashes noted Neuro: alert and oriented, no focal deficits   Assessment & Plan:   After obtaining consent, and per orders of Dr. Andria Frames, injection of Depomedrol and  given by Dr. Martinique Agamjot Kilgallon with aid of Dr. Andria Frames. Patient instructed to remain in  clinic for 20 minutes afterwards, and to report any adverse reaction to me immediately.  DJD (degenerative joint disease) of knee Steroid Injection to R knee, and unsuccessful attempt to inject L knee. Patient referred to sports medicine for injection. Looking at films, it appears that patient may require surgery due to degree of OA in BL knees. Not improved after weight loss, but will allow her surgery to be easier. Will continue with injections and pain management as able until patient  is able to have surgery.   Will also start taking the tumeric again as it once helped her some. She would like to avoid narcotics given her job.   Diabetes mellitus, stable ([place]) Well controlled. Last A1c at surgeon was 6.1 on 07/22/17- will obtain record of this. CBG's remain stable and improved. Patient continues to lose weight and has lost 47 lb.  - Will discontinue metformin after 10 days out from injections in knees, due to patient having difficulty swallowing pills and improved A1c. - Will monitor closely, but given that patient continues to have weight loss, am hopeful she will not require any further medication and can treat with diet and exercise alone.  Morbid obesity ([place]) S/p sleeve gastrectomy. Patient has lost 47 lbs total and is doing well with diet changes. She will start exercising within the next month after schedule changing.   HYPERTENSION, BENIGN ESSENTIAL Controlled. 138/78 in clinic which is minimally elevated. Patient checking it at home and it is usually not as high. She is only taking 20mg  Benazepril daily and we will continue with this and monitor closely as she continues to lose weight. She is checking at home daily.   Follow up in 2 months.    Martinique Chanc Kervin, DO Family Medicine Resident PGY-1

## 2017-08-10 NOTE — Assessment & Plan Note (Signed)
>>  ASSESSMENT AND PLAN FOR ARTHRITIS OF KNEE, DEGENERATIVE WRITTEN ON 08/10/2017  8:52 AM BY SHIRLEY, JORDAN, DO  Steroid Injection to R knee, and unsuccessful attempt to inject L knee. Patient referred to sports medicine for injection. Looking at films, it appears that patient may require surgery due to degree of OA in BL knees. Not improved after weight loss, but will allow her surgery to be easier. Will continue with injections and pain management as able until patient is able to have surgery.   Will also start taking the tumeric again as it once helped her some. She would like to avoid narcotics given her job.

## 2017-08-10 NOTE — Assessment & Plan Note (Signed)
Controlled. 138/78 in clinic which is minimally elevated. Patient checking it at home and it is usually not as high. She is only taking 20mg  Benazepril daily and we will continue with this and monitor closely as she continues to lose weight. She is checking at home daily.   Follow up in 2 months.

## 2017-08-10 NOTE — Assessment & Plan Note (Signed)
Well controlled. Last A1c at surgeon was 6.1 on 07/22/17- will obtain record of this. CBG's remain stable and improved. Patient continues to lose weight and has lost 47 lb.  - Will discontinue metformin after 10 days out from injections in knees, due to patient having difficulty swallowing pills and improved A1c. - Will monitor closely, but given that patient continues to have weight loss, am hopeful she will not require any further medication and can treat with diet and exercise alone.

## 2017-08-10 NOTE — Assessment & Plan Note (Addendum)
Steroid Injection to R knee, and unsuccessful attempt to inject L knee. Patient referred to sports medicine for injection. Looking at films, it appears that patient may require surgery due to degree of OA in BL knees. Not improved after weight loss, but will allow her surgery to be easier. Will continue with injections and pain management as able until patient is able to have surgery.   Will also start taking the tumeric again as it once helped her some. She would like to avoid narcotics given her job.

## 2017-08-10 NOTE — Assessment & Plan Note (Signed)
S/p sleeve gastrectomy. Patient has lost 47 lbs total and is doing well with diet changes. She will start exercising within the next month after schedule changing.

## 2017-08-19 ENCOUNTER — Ambulatory Visit: Payer: BLUE CROSS/BLUE SHIELD | Admitting: Family Medicine

## 2017-08-19 VITALS — BP 150/88 | Ht 64.5 in | Wt 302.0 lb

## 2017-08-19 DIAGNOSIS — E119 Type 2 diabetes mellitus without complications: Secondary | ICD-10-CM

## 2017-08-19 DIAGNOSIS — M170 Bilateral primary osteoarthritis of knee: Secondary | ICD-10-CM

## 2017-08-19 MED ORDER — HYDROCODONE-ACETAMINOPHEN 5-325 MG PO TABS: 2.0000 | ORAL_TABLET | Freq: Every evening | ORAL | 0 refills | Status: AC | PRN

## 2017-08-19 MED ORDER — HYDROCODONE-ACETAMINOPHEN 5-325 MG PO TABS: 2.0000 | ORAL_TABLET | Freq: Every evening | ORAL | 0 refills | Status: DC | PRN

## 2017-08-19 MED ORDER — METHYLPREDNISOLONE ACETATE 40 MG/ML IJ SUSP
40.0000 mg | Freq: Once | INTRAMUSCULAR | Status: AC
  Administered 2017-08-19: 40 mg via INTRA_ARTICULAR

## 2017-08-22 ENCOUNTER — Encounter: Payer: Self-pay | Admitting: Family Medicine

## 2017-08-22 NOTE — Assessment & Plan Note (Signed)
>>  ASSESSMENT AND PLAN FOR ARTHRITIS OF KNEE, DEGENERATIVE WRITTEN ON 08/22/2017  9:49 AM BY Mataio Mele L, MD  Corticosteroid injections the left knee today.  She is congratulated on her outstanding weight loss status post gastric sleeve.  I would like to get new x-rays.  At some point she will now be a candidate for total knee replacement.  I am not sure the exact weight to be so after we get the new films, I would like to get her in to see orthopedics and get an idea of where we are on the continuum.  I will see her back in 1 month to see how this injection has done, follow-up progression towards TKR

## 2017-08-22 NOTE — Assessment & Plan Note (Signed)
Corticosteroid injections the left knee today.  She is congratulated on her outstanding weight loss status post gastric sleeve.  I would like to get new x-rays.  At some point she will now be a candidate for total knee replacement.  I am not sure the exact weight to be so after we get the new films, I would like to get her in to see orthopedics and get an idea of where we are on the continuum.  I will see her back in 1 month to see how this injection has done, follow-up progression towards TKR

## 2017-08-22 NOTE — Progress Notes (Signed)
    CHIEF COMPLAINT / HPI: Left knee pain As they tried to do injection in both knees.  Unfortunately they could not get the injection done in the left knee.  That was successful with the right knee.  The right knee has improved since the injection would like to consider injection today in the left knee.  Improved after injections in the last weeks to months, gradually worsening over time..  With stairs.  REVIEW OF SYSTEMS: No fever.  No locking or giving way of the knee.  No knee swelling.  PERTINENT  PMH / PSH: I have reviewed the patient's medications, allergies, past medical and surgical history, smoking status and updated in the EMR as appropriate.   OBJECTIVE: : Well-developed female no overweight, no acute distress KNEES: No effusion on either knee.  She has full range of motion flexion extension.  Medial greater than lateral joint line tenderness on both knees.  Ligamentously intact to varus and valgus stress bilaterally.  Calf is soft.  Popliteal space is benign. VASCULAR: Dorsalis pedis pulses 2+ bilaterally symmetrical SKIN: No rash, no erythema or lesions noted around the left knee area. NEURO: Intact sensation soft touch bilateral lower extremity  IMAGING: Bilateral standing knee films from May 2017 revealed bone-on-bone lateral joint space narrowing.  PROCEDURE: INJECTION: Patient was given informed consent, signed copy in the chart. Appropriate time out was taken. Area prepped and draped in usual sterile fashion. Ethyl chloride was  used for local anesthesia. A 21 gauge 1 1/2 inch needle was used.. 1 cc of methylprednisolone 40 mg/ml plus  4 cc of 1% lidocaine without epinephrine was injected into the LEFT KNEE JOINT  using a(n) anterior medial approach.   The patient tolerated the procedure well. There were no complications. Post procedure instructions were given.   ASSESSMENT / PLAN: Please see problem oriented charting for details

## 2017-09-16 ENCOUNTER — Ambulatory Visit: Payer: BLUE CROSS/BLUE SHIELD | Admitting: Family Medicine

## 2017-09-16 ENCOUNTER — Encounter: Payer: Self-pay | Admitting: Family Medicine

## 2017-09-16 VITALS — BP 119/66 | HR 79

## 2017-09-16 DIAGNOSIS — M170 Bilateral primary osteoarthritis of knee: Secondary | ICD-10-CM | POA: Diagnosis not present

## 2017-09-16 DIAGNOSIS — M25561 Pain in right knee: Secondary | ICD-10-CM

## 2017-09-16 DIAGNOSIS — M25562 Pain in left knee: Secondary | ICD-10-CM

## 2017-09-16 DIAGNOSIS — G8929 Other chronic pain: Secondary | ICD-10-CM | POA: Diagnosis not present

## 2017-09-16 DIAGNOSIS — Z6841 Body Mass Index (BMI) 40.0 and over, adult: Secondary | ICD-10-CM | POA: Diagnosis not present

## 2017-09-16 MED ORDER — HYDROCODONE-ACETAMINOPHEN 10-325 MG PO TABS: ORAL_TABLET | ORAL | 0 refills | Status: DC

## 2017-09-16 NOTE — Progress Notes (Signed)
  Valerie Carter - 57 y.o. female MRN [id number]  Date of birth: [birth year]    SUBJECTIVE:      Chief Complaint:/ HPI:  Bilateral but left greater than right knee pain.  The last office visit we did an injection left knee.  It helped for several days but overall the knee is actually worse.  She is now noticing a catching sensation if she turns certain ways.  Feels like the knee needs to pop and this is a new symptom for her.  Pain is increasing.  We had started her on hydrocodone at night and it really helps her get to sleep but she is waking up about 2:30 in the morning pain level before she took medication.  Really having a lot of problems getting a good night sleep.   ROS:     No unusual weight change, no fever.  Has noted no calf pain.  The redness or unusual warmth of either knee.  PERTINENT  PMH / PSH FH / / SH:  Past Medical, Surgical, Social, and Family History Reviewed & Updated in the EMR.  Pertinent findings include:  Gastric sleeve surgery. History of diabetes mellitus not currently on any medications secondary to improvement in her obesity/weight loss significant is now diet controlling her diabetes. Never smoker  OBJECTIVE: BP 119/66   Pulse 79   Physical Exam:  Vital signs are reviewed. GENERAL: Well-developed overweight female no acute distress KNEES: Symmetrical.  I see no effusion on either knee.  Right and left knee has full range of motion flexion extension.  She has mild tenderness to palpation medial joint line of the right knee and on the left knee she has very significant medial and lateral joint line tenderness.  Positive McMurray on the left knee. VASCULAR: Dorsalis pedis pulses are 2+ bilaterally symmetrical NEURO: Intact sensation soft touch bilateral lower extremities from the knee down.  Skin: Area around knee is without any sign of erythema, no unusual warmth,lesions or rash.  IMAGING: Bilateral standing the AP view May 2017 reviewed.  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  ASSESSMENT & PLAN:  See problem based charting & AVS for pt instructions.

## 2017-09-16 NOTE — Assessment & Plan Note (Signed)
I had intended to get some x-rays in the interim last visit this visit reviewing the old x-rays, she has very severe total joint space loss in the lateral compartments.  And concerned that now she has an actual flap tear on the left versus total degeneration of the lateral meniscus with;  she may need to get an MRI to evaluate this.  Do not think her need for pain medication will be long-standing.  Ultimately she hopes to get TKR but she wants to work around her job she does not have to take as much time off.  Summer would be a better time for her to do knee surgery.  Set up x-rays and likely MRI.  I will increase her dose of pain medication.  We discussed red flags particularly trying the new dose on the weekend when she does not have to work the next day.

## 2017-09-16 NOTE — Assessment & Plan Note (Signed)
>>  ASSESSMENT AND PLAN FOR ARTHRITIS OF KNEE, DEGENERATIVE WRITTEN ON 09/16/2017  2:34 PM BY Derry Arbogast L, MD  I had intended to get some x-rays in the interim last visit this visit reviewing the old x-rays, she has very severe total joint space loss in the lateral compartments.  And concerned that now she has an actual flap tear on the left versus total degeneration of the lateral meniscus with;  she may need to get an MRI to evaluate this.  Do not think her need for pain medication will be long-standing.  Ultimately she hopes to get TKR but she wants to work around her job she does not have to take as much time off.  Summer would be a better time for her to do knee surgery.  Set up x-rays and likely MRI.  I will increase her dose of pain medication.  We discussed red flags particularly trying the new dose on the weekend when she does not have to work the next day.

## 2017-09-16 NOTE — Assessment & Plan Note (Signed)
Status post gastric sleeve.  This point she is lost about 70 pounds from her max weight.  Congratulated.

## 2017-09-19 ENCOUNTER — Ambulatory Visit
Admission: RE | Admit: 2017-09-19 | Discharge: 2017-09-19 | Disposition: A | Discharge status: Home / Self Care | Payer: BLUE CROSS/BLUE SHIELD | Source: Ambulatory Visit | Attending: Family Medicine | Admitting: Family Medicine

## 2017-09-19 DIAGNOSIS — M25562 Pain in left knee: Principal | ICD-10-CM

## 2017-09-19 DIAGNOSIS — M25561 Pain in right knee: Secondary | ICD-10-CM | POA: Diagnosis not present

## 2017-09-19 DIAGNOSIS — S8991XA Unspecified injury of right lower leg, initial encounter: Secondary | ICD-10-CM | POA: Diagnosis not present

## 2017-09-19 DIAGNOSIS — S8992XA Unspecified injury of left lower leg, initial encounter: Secondary | ICD-10-CM | POA: Diagnosis not present

## 2017-09-19 DIAGNOSIS — G8929 Other chronic pain: Secondary | ICD-10-CM

## 2017-09-30 ENCOUNTER — Ambulatory Visit: Payer: BLUE CROSS/BLUE SHIELD | Admitting: Family Medicine

## 2017-09-30 DIAGNOSIS — M170 Bilateral primary osteoarthritis of knee: Secondary | ICD-10-CM

## 2017-09-30 MED ORDER — FLUOXETINE HCL 10 MG PO TABS: 10.0000 mg | ORAL_TABLET | Freq: Every day | ORAL | 2 refills | Status: DC

## 2017-09-30 MED ORDER — HYDROCODONE-ACETAMINOPHEN 10-325 MG PO TABS: ORAL_TABLET | ORAL | 0 refills | Status: DC

## 2017-09-30 NOTE — Assessment & Plan Note (Signed)
We had a long discussion about her options which are very limited.  Encouraged her to continue try to lose weight.  Fairly successful at this.  We will start her on 10 mg of fluoxetine to see tolerate the Cymbalta.  She cannot use NSAIDs secondary to her gastric sleeve current pain medications.  Her back in 6 weeks.  We spent greater than 50% of our 15-minute office visit in counseling and education regarding options, treatment center.

## 2017-09-30 NOTE — Progress Notes (Signed)
  SWATI GRANBERRY - 57 y.o. female MRN [id number]  Date of birth: 1961/03/17    SUBJECTIVE:      Chief Complaint:/ HPI:  Follow-up bilateral left greater than right knee pain.  The increase in pain medication helps some but she still waking up every night for 30.   she is continuing to try to lose weight.  Frustrated because she really cannot exercise.   ROS:     Knee pain.  No fever.  PERTINENT  PMH / PSH FH / / SH:  Past Medical, Surgical, Social, and Family History Reviewed & Updated in the EMR.  Pertinent findings include:    OBJECTIVE: BP 133/76   Ht 5' 4.5" (1.638 m)   Wt 295 lb (133.8 kg)   BMI 49.85 kg/m   Physical Exam:  Vital signs are reviewed. GENERAL: Well-developed female no acute distress  ASSESSMENT & PLAN:  See problem based charting & AVS for pt instructions.

## 2017-09-30 NOTE — Assessment & Plan Note (Signed)
>>  ASSESSMENT AND PLAN FOR ARTHRITIS OF KNEE, DEGENERATIVE WRITTEN ON 09/30/2017  5:49 PM BY Darbi Chandran L, MD  We had a long discussion about her options which are very limited.  Encouraged her to continue try to lose weight.  Fairly successful at this.  We will start her on 10 mg of fluoxetine  to see tolerate the Cymbalta .  She cannot use NSAIDs secondary to her gastric sleeve current pain medications.  Her back in 6 weeks.  We spent greater than 50% of our 15-minute office visit in counseling and education regarding options, treatment center.

## 2017-10-26 DIAGNOSIS — E669 Obesity, unspecified: Secondary | ICD-10-CM | POA: Diagnosis not present

## 2017-10-26 DIAGNOSIS — R69 Illness, unspecified: Secondary | ICD-10-CM | POA: Diagnosis not present

## 2017-10-26 DIAGNOSIS — Z9884 Bariatric surgery status: Secondary | ICD-10-CM | POA: Diagnosis not present

## 2017-10-26 DIAGNOSIS — K912 Postsurgical malabsorption, not elsewhere classified: Secondary | ICD-10-CM | POA: Diagnosis not present

## 2017-11-03 ENCOUNTER — Encounter: Payer: BLUE CROSS/BLUE SHIELD | Attending: General Surgery | Admitting: Registered"

## 2017-11-03 ENCOUNTER — Encounter: Payer: Self-pay | Admitting: Registered"

## 2017-11-03 DIAGNOSIS — E559 Vitamin D deficiency, unspecified: Secondary | ICD-10-CM | POA: Diagnosis not present

## 2017-11-03 DIAGNOSIS — Z9884 Bariatric surgery status: Secondary | ICD-10-CM | POA: Diagnosis not present

## 2017-11-03 DIAGNOSIS — Z713 Dietary counseling and surveillance: Secondary | ICD-10-CM | POA: Insufficient documentation

## 2017-11-03 DIAGNOSIS — R69 Illness, unspecified: Secondary | ICD-10-CM | POA: Diagnosis not present

## 2017-11-03 DIAGNOSIS — E119 Type 2 diabetes mellitus without complications: Secondary | ICD-10-CM | POA: Diagnosis not present

## 2017-11-03 DIAGNOSIS — K912 Postsurgical malabsorption, not elsewhere classified: Secondary | ICD-10-CM | POA: Diagnosis not present

## 2017-11-03 DIAGNOSIS — E669 Obesity, unspecified: Secondary | ICD-10-CM

## 2017-11-03 DIAGNOSIS — E785 Hyperlipidemia, unspecified: Secondary | ICD-10-CM | POA: Diagnosis not present

## 2017-11-03 NOTE — Patient Instructions (Addendum)
-   Aim to increase physical activity with pool workouts 3 days/week, at least 30 min each.   - See snacks handout for ideas.   - Try not to focus weight and more so on health and how you are feeling.

## 2017-11-03 NOTE — Progress Notes (Signed)
Follow-up visit: 5 Months Post-Operative Sleeve gastretomy Surgery  Medical Nutrition Therapy:  Appt start time: 11:15 end time: 12:00.  Primary concerns today: Post-operative Bariatric Surgery Nutrition Management.  Non scale victories: clothes fitting better, less fatigue, reduced diabetes medications, blood sugar numbers are great, no longer taking blood pressure medications  Surgery date: 06/07/2017 Surgery type: Sleeve gastrectomy Start weight at [facility]: 360.8 lbs Weight today: 290 lbs (pt reported) Weight change: 27 lbs from 317.0 (08/04/2017) Total weight lost: 70.8 lbs (using 290, pt reported weight) Weight loss goal: alleviate some knee and back pain   TANITA  BODY COMP RESULTS  06/23/2017 08/04/2017 11/03/2017   BMI (kg/m^2) Pt declined Pt declined Pt declined   Fat Mass (lbs)      Fat Free Mass (lbs)      Total Body Water (lbs)       Pt arrives declining to have weight taken. Pt states she is made at the scale. Pt states she needs knee replacement surgery but needs to lose weight to have it. Pt states she has been around the same weight for the last month, between 288-290 lbs. Pt states she is doing well meeting daily protein and fluid goals. Pt states she wanted some potatoes and it did not agree with her stomach. Pt states she has not had grits or bread. Pt states she has cheese puffs once every 2 weeks. Pt states she has been walking more, unable to go to pool but has membership. Pt states she has bowel movements daily and sometimes its every other day. Pt states she has changed to bariatric multivitamin. Pt states BS numbers average 95-120.  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 Pt states she does not celebrate holidays.   Preferred Learning Style:   No preference indicated   Learning Readiness:   Ready  Change in progress  24-hr recall: B (AM): Fair life milk (13g) + greek  yogurt (15g)  Snk (AM): cheese puffs  L (PM): 3 oz tuna (21g) + spinach or salad/green beans Snk (PM): cheese stick (6g) D (PM): 3 oz burger (53G)  + cucumber slices Snk (PM): none  Fluid intake: water with flavor packs (28 +6 16.9 oz bottles), Powerade zero; 64+ ounces Estimated total protein intake: ~60+ grams   Medications: See list Supplementation: ProCare multi chewable + 3 Viactiv   CBG monitoring: yes Average CBG per patient: 95-120 Last patient reported A1c: 6.1 (07/22/2017)  Using straws: no Drinking while eating: no Having you been chewing well: no Chewing/swallowing difficulties: Sometimes when eating too much Changes in vision: no Changes to mood/headaches: no Hair loss/Changes to skin/Changes to nails: hair thinning, no, no Any difficulty focusing or concentrating: no Sweating: no Dizziness/Lightheaded: no Palpitations: no Carbonated beverages: no N/V/D/C/GAS: no, no, no, sometimes-taking Miralax, no Abdominal Pain: no Dumping syndrome: no Last Lap-Band fill: N/A  Recent physical activity:  Walking   Progress Towards Goal(s):  In progress.  Handouts given during visit include:  Snack Ideas for Bariatric Patients   Nutritional Diagnosis:  [place]-3.3 Overweight/obesity related to past poor dietary habits and physical inactivity as evidenced by patient w/ recent sleeve gastrectomy surgery following dietary guidelines for continued weight loss.    Intervention:  Nutrition education and counseling. Pt was counseled on how to not focus on the scale and how her weight is changing and more on doing what is best for her body. Pt  was counseled on ways to increase physical activity and also how to  Goals: - Aim to increase physical activity with pool workouts 3 days/week, at least 30 min each.  - See snacks handout for ideas.  - Try not to focus weight and more so on health and how you are feeling.   Teaching Method Utilized:  Visual Auditory Hands on  Barriers  to learning/adherence to lifestyle change: none identified  Demonstrated degree of understanding via:  Teach Back   Monitoring/Evaluation:  Dietary intake, exercise, lap band fills, and body weight. Follow up in 4 months for 9 month post-op visit.

## 2017-11-18 ENCOUNTER — Ambulatory Visit: Payer: BLUE CROSS/BLUE SHIELD | Admitting: Family Medicine

## 2017-11-18 VITALS — BP 121/79 | Ht 64.5 in | Wt 286.0 lb

## 2017-11-18 DIAGNOSIS — M25561 Pain in right knee: Secondary | ICD-10-CM

## 2017-11-18 DIAGNOSIS — G8929 Other chronic pain: Secondary | ICD-10-CM | POA: Diagnosis not present

## 2017-11-18 DIAGNOSIS — M25562 Pain in left knee: Secondary | ICD-10-CM

## 2017-11-18 MED ORDER — HYDROCODONE-ACETAMINOPHEN 10-325 MG PO TABS: ORAL_TABLET | ORAL | 0 refills | Status: DC

## 2017-11-18 NOTE — Patient Instructions (Signed)
[facility] Dr Dorna Leitz Appointment with be with his PA, John  Tuesday 11/29/17 at [place] 30 mins early with your insurance information  [address] [place] [phone]

## 2017-11-18 NOTE — Progress Notes (Signed)
     Date of Visit: 11/07/2017   HPI:  Valerie Carter is a 57 y.o. female with bilateral knee OA and obesity s/p bariatric surgery who presents for follow-up of bilateral knee pain. The pain in her left knee is worse than that of her right. Most of her pain is located in the region of the lateral joint lines bilaterally. Last week she said that the pain was so bad she could hardly walk. The pain is present at rest. She has tried an IA CS injection with minimal improvement. She takes ibuprofen 4x/day with some improvement, though she limits her intake given her history of bariatric surgery. 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 She reports that hydrocodone at bedtime is the only thing that truly helps. She sees nutrition every 3 months and has lost 80lbs within the past year. She endorses grinding, locking, and catching of her left knee. She also reports feeling unstable on her feet at times. She denies any notable swelling or skin changes. She endorses difficulty sleeping secondary to the pain. She denies numbness and tingling.  ROS: See HPI.   PHYSICAL EXAM: BP 108/74   Ht 5\' 7"  (1.702 m)   Wt 157 lb (71.2 kg)   BMI 24.59 kg/m  Gen: No acute distress, uncomfortable appearing while sitting in chair Heart: warm, well perfused Lungs: non-labored breathing Ext: mild pitting edema bilateral LE   Left Knee:  Normal to inspection with no erythema or effusion or obvious bony abnormalities. No warmth on palpation. TTP at the medial and lateral joint lines, lateral>medial. Mild TTP at quadriceps tendon. No patella or patellar tendon tenderness. ROM normal in flexion and extension and lower leg rotation. 5/5 strength with flexion and extension. Positive McMurray's.   Right Knee:  Normal to inspection with no erythema or effusion or obvious bony abnormalities. No warmth on palpation. TTP at the medial and lateral joint lines. No  quadriceps tendon, patella, or patellar tendon tenderness.  ROM normal in flexion and extension and lower leg rotation. 5/5 strength with flexion and extension. Negative McMurray's.   ASSESSMENT/PLAN:  Bilateral knee osteoarthritis: Patient presents with persistent bilateral knee pain L>R in the setting of positive McMurray's on the left. Probable left lateral degenerative meniscus tear in the setting of severe bilateral OA. Will not pursue MRI as it is unlikely to alter current management. She has not responded to intraarticular corticosteroid injection and was unable to tolerate prozac. She does feel like the hydrocodone provides significant relief but only using at night and having a lot of day time pain. Given the severity of her pain, will increase Norco to 1 tab in the morning, 1 tab mid-day, and 2 tabs nightly. She is unlikely to be a surgical candidate currently, but given her consistent weight loss, will refer to Dr. Berenice Primas to begin discussion of future TKA. She will continue with nutrition therapy every 3 months. Will see patient back in 3 months.   FOLLOW UP: Follow up in 3 months for bilateral knee pain.   Reinaldo Raddle, Medical Student

## 2017-11-20 ENCOUNTER — Encounter: Payer: Self-pay | Admitting: Family Medicine

## 2017-11-20 NOTE — Progress Notes (Signed)
[place] Attending Note: I have seen and examined this patient with the medical student. I have  reviewed the history, physical examination, assessment and plan as documented in the medical student's note.  I agree with the medical student's note and findings, assessment and treatment plan as documented with the  additions or changes I have made to the note.  Excellent progress with weight loss. 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  I will increase coverage for day time with low dose opiod to see if she can maintain activity level

## 2017-11-29 DIAGNOSIS — M25562 Pain in left knee: Secondary | ICD-10-CM | POA: Diagnosis not present

## 2017-11-29 DIAGNOSIS — M25561 Pain in right knee: Secondary | ICD-10-CM | POA: Diagnosis not present

## 2017-12-15 DIAGNOSIS — M1712 Unilateral primary osteoarthritis, left knee: Secondary | ICD-10-CM | POA: Diagnosis not present

## 2017-12-15 DIAGNOSIS — M1711 Unilateral primary osteoarthritis, right knee: Secondary | ICD-10-CM | POA: Diagnosis not present

## 2017-12-22 DIAGNOSIS — M1712 Unilateral primary osteoarthritis, left knee: Secondary | ICD-10-CM | POA: Diagnosis not present

## 2017-12-22 DIAGNOSIS — M1711 Unilateral primary osteoarthritis, right knee: Secondary | ICD-10-CM | POA: Diagnosis not present

## 2018-01-02 DIAGNOSIS — M1711 Unilateral primary osteoarthritis, right knee: Secondary | ICD-10-CM | POA: Diagnosis not present

## 2018-01-02 DIAGNOSIS — M1712 Unilateral primary osteoarthritis, left knee: Secondary | ICD-10-CM | POA: Diagnosis not present

## 2018-01-09 ENCOUNTER — Other Ambulatory Visit: Payer: Self-pay

## 2018-01-09 ENCOUNTER — Encounter (HOSPITAL_COMMUNITY): Payer: Self-pay | Admitting: Emergency Medicine

## 2018-01-09 ENCOUNTER — Ambulatory Visit (HOSPITAL_COMMUNITY)
Admission: EM | Admit: 2018-01-09 | Discharge: 2018-01-09 | Disposition: A | Discharge status: Home / Self Care | Payer: BLUE CROSS/BLUE SHIELD | Attending: Family Medicine | Admitting: Family Medicine

## 2018-01-09 DIAGNOSIS — M109 Gout, unspecified: Secondary | ICD-10-CM | POA: Diagnosis not present

## 2018-01-09 MED ORDER — PREDNISONE 10 MG PO TABS: 40.0000 mg | ORAL_TABLET | Freq: Every day | ORAL | 0 refills | Status: AC

## 2018-01-09 NOTE — ED Provider Notes (Signed)
[facility]    CSN: [id number] Arrival date & time: 01/09/18  1514     History   Chief Complaint Chief Complaint  Patient presents with  . Toe Pain    left    HPI BRIENA SWINGLER is a 57 y.o. female.   Patient is a 57 year old female with 7 days of left lateral foot pain around the great toe with swelling.  The pain has been constant and worsening.  She denies any injury to the foot.  She has a prior history of hypertension and diabetes pre-sleeve gastrectomy.  She has been taking Tylenol and ibuprofen with minimal relief.  She denies any fever, rashes, chills, fatigue, body aches.  She denies history of gout.   ROS per HPI      Past Medical History:  Diagnosis Date  . Back pain   . Carpal tunnel syndrome    while driving both hands  . Diabetes mellitus without complication ([place])    type 2  . DJD (degenerative joint disease)    both knees right worse than left  . Headache   . Hyperlipidemia   . Hypertension   . Intramural leiomyoma of uterus   . Knee pain   . Morbid obesity with BMI of 50.0-59.9, adult ([place])   . Vitamin D deficiency     Patient Active Problem List   Diagnosis Date Noted  . 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 Intramural leiomyoma of uterus 01/27/2016  . Mild depression ([place]) 09/25/2014  . Vitamin D deficiency 10/19/2013  . Seasonal allergies 09/07/2011  . 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 Diabetes mellitus, stable ([place]) 05/20/2010  . Low back pain 01/29/2010  . Hyperlipidemia LDL goal <70 10/01/2008  . Obesity 10/24/2006  . HYPERTENSION, BENIGN ESSENTIAL 09/26/2006    Past Surgical History:  Procedure Laterality Date  . KNEE SURGERY Right 1999   torn cartlidge repair  . LAPAROSCOPIC GASTRIC SLEEVE RESECTION N/A 06/07/2017   Procedure: LAPAROSCOPIC GASTRIC SLEEVE RESECTION, UPPER ENDOSCOPY;  Surgeon: Kieth Brightly, Arta Bruce, MD;  Location: [facility] ORS;  Service: General;  Laterality: N/A;    OB  History    Gravida  5   Para  0   Term  0   Preterm  0   AB  5   Living  0     SAB  5   TAB      Ectopic      Multiple      Live Births               Home Medications    Prior to Admission medications   Medication Sig Start Date End Date Taking? Authorizing Provider  Ascorbic Acid (VITAMIN C PO) Take 1 tablet by mouth daily.   Yes [provider]  Calcium Carbonate-Vit D-Min (CALCIUM 1200 PO) Take 1 tablet by mouth 2 (two) times daily.   Yes [provider]  ferrous sulfate 325 (65 FE) MG tablet TAKE ONE TABLET BY MOUTH ONCE DAILY WITH BREAKFAST Patient taking differently: TAKE ONE TABLET BY MOUTH ONCE DAILY AS NEEDED WITH MENSTRATION ONLY 02/21/17  Yes Enid Derry, [place], DO  Multiple Vitamin (MULTIVITAMIN WITH MINERALS) TABS tablet Take 1 tablet by mouth 2 (two) times daily.   Yes [provider]  TURMERIC PO Take 750 mg by mouth 2 (two) times daily.   Yes [provider]  Acetaminophen (TYLENOL ARTHRITIS EXT RELIEF PO) Take by mouth daily.    [provider]  aspirin EC 81 MG tablet Take 81 mg by mouth daily.    [provider]  Blood Glucose Monitoring Suppl (ONE TOUCH ULTRA SYSTEM KIT) W/DEVICE KIT 1 kit by Does not apply route once. 07/06/13   [place], MD  glucose blood test strip Use as instructed 07/06/13   P[place]oto de Gwendalyn Ege, [place], MD  HYDROcodone-acetaminophen [facility]) 10-325 MG tablet Take one AM and at lunch and 2 qhs prn knee pain 11/18/17   Dickie La, MD  OMEGA-3 FATTY ACIDS PO Take 1 capsule by mouth daily.     [provider]  polyethylene glycol powder (GLYCOLAX/MIRALAX) powder Take 17 g daily as needed by mouth. Patient taking differently: Take 17 g by mouth daily as needed (FOR CONSTIPATION.).  05/02/17   Shirley, [place], DO  predniSONE (DELTASONE) 10 MG tablet Take 4 tablets (40 mg total) by mouth daily for 5 days. 01/09/18 01/14/18  Orvan July, NP    Family  History Family History  Problem Relation Age of Onset  . Diabetes Sister   . Hypertension Sister   . Breast cancer Maternal Aunt   . Diabetes Maternal Aunt   . Diabetes Father     Social History Social History   Tobacco Use  . Smoking status: Never Smoker  . Smokeless tobacco: Never Used  Substance Use Topics  . Alcohol use: No    Alcohol/week: 0.0 oz    Frequency: Never  . Drug use: No     Allergies   Patient has no known allergies.   Review of Systems Review of Systems   Physical Exam Triage Vital Signs ED Triage Vitals [01/09/18 1542]  Enc Vitals Group     BP 131/81     Pulse Rate 92     Resp      Temp 98.1 F (36.7 C)     Temp Source Oral     SpO2 100 %     Weight      Height      Head Circumference      Peak Flow      Pain Score 6     Pain Loc      Pain Edu?      Excl. in [place]?    No data found.  Updated Vital Signs BP 131/81 (BP Location: Left Wrist)   Pulse 92   Temp 98.1 F (36.7 C) (Oral)   SpO2 100%   Visual Acuity Right Eye Distance:   Left Eye Distance:   Bilateral Distance:    Right Eye Near:   Left Eye Near:    Bilateral Near:     Physical Exam  Constitutional: She is oriented to person, place, and time. She appears well-developed and well-nourished.  HENT:  Head: Normocephalic and atraumatic.  Pulmonary/Chest: Effort normal.  Musculoskeletal:  Tender to palpation of the left great toe and metatarsal head with mild erythema and edema. The edema extends throughout the foot.  Pedal pulses and sensation intact.  Temperature normal.   Neurological: She is alert and oriented to person, place, and time.  Skin: Skin is warm and dry. Capillary refill takes less than 2 seconds.  Psychiatric: She has a normal mood and affect.     UC Treatments / Results  Labs (all labs ordered are listed, but only abnormal results are displayed) Labs Reviewed - No data to display  EKG None  Radiology No results  found.  Procedures Procedures (including critical care time)  Medications Ordered  in UC Medications - No data to display  Initial Impression / Assessment and Plan / UC Course  I have reviewed the triage vital signs and the nursing notes.  Pertinent labs & imaging results that were available during my care of the patient were reviewed by me and considered in my medical decision making (see chart for details).    Based on clinical presentation and negative history of injury diagnosis most likely gout.  Other differential could include arthritis.  No x-ray warranted.  Will treat with 5 days of prednisone.  Told to return with worsening symptoms. Final Clinical Impressions(s) / UC Diagnoses   Final diagnoses:  Acute gout of right foot, unspecified cause     Discharge Instructions     It was nice meeting you!!  I believe that you are having a gout flare. We will treat this with prednisone for 5 days.  If no improvement in the next few days, or getting worse please follow up.  I will give you some information and handouts about gout and diet.       ED Prescriptions    Medication Sig Dispense Auth. Provider   predniSONE (DELTASONE) 10 MG tablet Take 4 tablets (40 mg total) by mouth daily for 5 days. 20 tablet Loura Halt A, NP     Controlled Substance Prescriptions [place] Controlled Substance Registry consulted? Not Applicable   Orvan July, NP 01/09/18 1746

## 2018-01-09 NOTE — ED Triage Notes (Signed)
Pt reports left toe pain not related to an injury.

## 2018-01-09 NOTE — Discharge Instructions (Signed)
It was nice meeting you!!  I believe that you are having a gout flare. We will treat this with prednisone for 5 days.  If no improvement in the next few days, or getting worse please follow up.  I will give you some information and handouts about gout and diet.

## 2018-01-10 ENCOUNTER — Ambulatory Visit: Payer: BLUE CROSS/BLUE SHIELD | Admitting: Family Medicine

## 2018-01-11 ENCOUNTER — Telehealth: Payer: Self-pay | Admitting: *Deleted

## 2018-01-12 MED ORDER — HYDROCODONE-ACETAMINOPHEN 10-325 MG PO TABS: ORAL_TABLET | ORAL | 0 refills | Status: DC

## 2018-01-12 NOTE — Telephone Encounter (Signed)
[name] I have sent the rx electronically. Please let her know THANKS! Valerie Carter

## 2018-01-20 ENCOUNTER — Telehealth: Payer: Self-pay | Admitting: *Deleted

## 2018-01-20 ENCOUNTER — Ambulatory Visit (INDEPENDENT_AMBULATORY_CARE_PROVIDER_SITE_OTHER): Payer: BLUE CROSS/BLUE SHIELD | Admitting: Family Medicine

## 2018-01-20 VITALS — BP 110/80 | HR 78 | Temp 98.2°F | Wt 291.4 lb

## 2018-01-20 DIAGNOSIS — M109 Gout, unspecified: Secondary | ICD-10-CM | POA: Insufficient documentation

## 2018-01-20 MED ORDER — COLCHICINE 0.6 MG PO TABS: ORAL_TABLET | ORAL | 0 refills | Status: DC

## 2018-01-20 NOTE — Telephone Encounter (Signed)
Received message from Pharmacy.  Mitigare capsules require a PA.  Called insurance, Mitigare is covered for 30 days for $20. Contacted pharmacy and PA was a mistake.  Please disregard. Valerie Carter, [name], CMA

## 2018-01-20 NOTE — Telephone Encounter (Signed)
Colchicine tablets are going to cost $100.  If I looked at her formulary correctly, the capsules should be cheaper. Approval received from Dr. Tammi Klippel to verbally change.  Attempted to call [facility] but they do not come back from lunch until 2.  Will try then. Enas Winchel, [name], CMA

## 2018-01-20 NOTE — Telephone Encounter (Signed)
Called capsules in verbally. They will run and let me know if it is not covered or more expensive.  Pt informed. Fleeger, [name], CMA

## 2018-01-20 NOTE — Progress Notes (Signed)
Subjective:    Patient ID: Valerie Carter, female    DOB: [birth year], 57 y.o.   MRN: [id number]   CC: gout flare  HPI: Gout flare Patient presenting today after she was told she had an acute gout flare when she went to urgent care.  Patient saw urgent care on 7/15 and was given prednisone for 5 days.  Labs or imaging obtained at urgent care.  Patient completed prednisone course but says it did not help.  Patient is very confused that she says she does not have a diet heavy and rich foods or shellfish.  Patient eats meat on occasion but it is usually fish or chicken breast.  Patient recently had weight loss surgery and says her diet has improved and is very limited.  Patient says her left foot pain has now been present for 3 weeks.  Patient cannot get her shoe on her move her big toe.  She says sometimes it is worse than other times.  Patient said pain woke her up last night from her sleep as it was throbbing.  Patient has been using hydrocodone that she has at home and Motrin.  Patient can bear weight but it is painful.  Patient denies any trauma to the area.  Eyes any family history or personal history of gout.  Patient says she has unfortunately not been able to be as active due to pain in foot.  She reports no changes in medications, has even stopped all her blood pressure medications recently after weight loss surgery.  Patient notes only change is gel injections to her knees bilaterally.   Objective:  BP 110/80 (BP Location: Left Wrist, Patient Position: Sitting, Cuff Size: Large)   Pulse 78   Temp 98.2 F (36.8 C) (Oral)   Wt 291 lb 6.4 oz (132.2 kg)   SpO2 99%   BMI 49.25 kg/m  Vitals and nursing note reviewed  General: well nourished, in no acute distress HEENT: moist mucous membranes  Extremities: no edema or cyanosis. Warm, well perfused. 2+ radial and PT pulses bilaterally.  Left great toe swollen, red, and hot to touch.  Tender to palpation of left great toe.  Full range of  motion in right foot.  Left foot with limited range of motion in left great toe due to tenderness.  Unable to do active or passive range of motion of left great toe as it is very painful. Skin: warm and dry, no rashes noted Neuro: alert and oriented, no focal deficits   Assessment & Plan:    Podagra Patient with likely acute gout flare given clinical presentation.  No previous labs available or imaging to confirm.  We will plan to obtain uric acid level today to help with diagnosis and management plan.  Instructed patient to start using colchicine to help reduce flare.  Will take 1.2 mg today followed by 0.6 mg 1 hour later.  Patient then to take 0.6 mg daily (up to twice daily if needed) until flare resolves.  Pending uric acid levels can consider chronic allopurinol treatment to help prevent further flares.  Will not use any allopurinol during acute flare.  Patient not on any diuretics which would cause these flares.  Instructed patient on how to prevent flares with diet modifications.  If no improvement with colchicine can consider imaging to help expand differential diagnoses. -Colchicine 1.2 mg allowed by 0.6 mg in 1 hour.  Starting on day 2 0.6 mg daily (up to twice daily) until flare resolves -  Can consider chronic management with allopurinol 100 mg daily -Instructed patient to follow-up with PCP once acute flare is resolved -Obtain uric acid level -Can consider imaging if no improvement  Discussed patient with Dr. Andria Frames     Return in about 1 week (around 01/27/2018).   Caroline More, DO, PGY-2

## 2018-01-20 NOTE — Assessment & Plan Note (Addendum)
Patient with likely acute gout flare given clinical presentation.  No previous labs available or imaging to confirm.  We will plan to obtain uric acid level today to help with diagnosis and management plan.  Instructed patient to start using colchicine to help reduce flare.  Will take 1.2 mg today followed by 0.6 mg 1 hour later.  Patient then to take 0.6 mg daily (up to twice daily if needed) until flare resolves.  Pending uric acid levels can consider chronic allopurinol treatment to help prevent further flares.  Will not use any allopurinol during acute flare.  Patient not on any diuretics which would cause these flares.  Instructed patient on how to prevent flares with diet modifications.  If no improvement with colchicine can consider imaging to help expand differential diagnoses. -Colchicine 1.2 mg allowed by 0.6 mg in 1 hour.  Starting on day 2 0.6 mg daily (up to twice daily) until flare resolves -Can consider chronic management with allopurinol 100 mg daily -Instructed patient to follow-up with PCP once acute flare is resolved -Obtain uric acid level -Can consider imaging if no improvement  Discussed patient with Dr. Andria Frames

## 2018-01-20 NOTE — Patient Instructions (Addendum)
Gout Gout is painful swelling that can happen in some of your joints. Gout is a type of arthritis. This condition is caused by having too much uric acid in your body. Uric acid is a chemical that is made when your body breaks down substances called purines. If your body has too much uric acid, sharp crystals can form and build up in your joints. This causes pain and swelling. Gout attacks can happen quickly and be very painful (acute gout). Over time, the attacks can affect more joints and happen more often (chronic gout). Follow these instructions at home: During a Gout Attack  If directed, put ice on the painful area: ? Put ice in a plastic bag. ? Place a towel between your skin and the bag. ? Leave the ice on for 20 minutes, 2-3 times a day.  Rest the joint as much as possible. If the joint is in your leg, you may be given crutches to use.  Raise (elevate) the painful joint above the level of your heart as often as you can.  Drink enough fluids to keep your pee (urine) clear or pale yellow.  Take over-the-counter and prescription medicines only as told by your doctor.  Do not drive or use heavy machinery while taking prescription pain medicine.  Follow instructions from your doctor about what you can or cannot eat and drink.  Return to your normal activities as told by your doctor. Ask your doctor what activities are safe for you. Avoiding Future Gout Attacks  Follow a low-purine diet as told by a specialist (dietitian) or your doctor. Avoid foods and drinks that have a lot of purines, such as: ? Liver. ? Kidney. ? Anchovies. ? Asparagus. ? Herring. ? Mushrooms ? Mussels. ? Beer.  Limit alcohol intake to no more than 1 drink a day for nonpregnant women and 2 drinks a day for men. One drink equals 12 oz of beer, 5 oz of wine, or 1 oz of hard liquor.  Stay at a healthy weight or lose weight if you are overweight. If you want to lose weight, talk with your doctor. It is  important that you do not lose weight too fast.  Start or continue an exercise plan as told by your doctor.  Drink enough fluids to keep your pee clear or pale yellow.  Take over-the-counter and prescription medicines only as told by your doctor.  Keep all follow-up visits as told by your doctor. This is important. Contact a doctor if:  You have another gout attack.  You still have symptoms of a gout attack after10 days of treatment.  You have problems (side effects) because of your medicines.  You have chills or a fever.  You have burning pain when you pee (urinate).  You have pain in your lower back or belly. Get help right away if:  You have very bad pain.  Your pain cannot be controlled.  You cannot pee. This information is not intended to replace advice given to you by your health care provider. Make sure you discuss any questions you have with your health care provider. Document Released: 03/23/2008 Document Revised: 11/20/2015 Document Reviewed: 03/27/2015 Elsevier Interactive Patient Education  [employer].  It was a pleasure seeing you today.   Today we discussed your gout  For your acute flare: I will get uric acid levels today. I have prescribed colchicine. Take 1.2mg  today and then 0.6mg  1 hour after. Starting tomorrow start taking 0.6mg  daily (you can increase up  to twice daily if needed) until the flare resolves. When it resolves follow up with your PCP to see if you need chronic medicine.   Please follow up after the flare resolves or sooner if symptoms persist or worsen. Please call the clinic immediately if you have any concerns.   Our clinic's number is [phone]. Please call with questions or concerns.   Thank you,  Caroline More, DO

## 2018-01-21 LAB — URIC ACID: Uric Acid: 5.5 mg/dL (ref 2.5–7.1)

## 2018-01-23 ENCOUNTER — Encounter: Payer: Self-pay | Admitting: Family Medicine

## 2018-01-23 ENCOUNTER — Telehealth: Payer: Self-pay | Admitting: Registered"

## 2018-01-23 NOTE — Telephone Encounter (Signed)
RD returned pt's phone call. Pt had bariatric nutrition questions  that would require an appt. RD answered questions related to sodium intake and fluid retention. Pt states she will call tomorrow to schedule an appt with RD.

## 2018-01-26 ENCOUNTER — Encounter: Payer: BLUE CROSS/BLUE SHIELD | Attending: General Surgery | Admitting: Registered"

## 2018-01-26 ENCOUNTER — Encounter: Payer: Self-pay | Admitting: Registered"

## 2018-01-26 DIAGNOSIS — E119 Type 2 diabetes mellitus without complications: Secondary | ICD-10-CM

## 2018-01-26 DIAGNOSIS — Z713 Dietary counseling and surveillance: Secondary | ICD-10-CM | POA: Insufficient documentation

## 2018-01-26 NOTE — Patient Instructions (Addendum)
-   Check into mental health professional.   - Chew at least 30 times per bite or to applesauce consistency.  - Listen to body for when body is satisfied.   - Practice mindful eating.   - Look into attending BELT at least once a week.   - Try to eat every 3-5 hours and within the first 2 hours of waking up.   - Decrease fried food intake.

## 2018-01-26 NOTE — Progress Notes (Signed)
Follow-up visit: 7 Months Post-Operative Sleeve gastretomy Surgery  Medical Nutrition Therapy:  Appt start time: 9:15 end time: 10:00.  Primary concerns today: Post-operative Bariatric Surgery Nutrition Management.  Non scale victories: clothes fitting better, less fatigue, reduced diabetes medications, blood sugar numbers are great, no longer taking blood pressure medications, from sizes 32/32-22/24  Surgery date: 06/07/2017 Surgery type: Sleeve gastrectomy Start weight at [facility]: 360.8 lbs Weight today: 292.1 lbs (pt reported) Weight change: 2.1 lbs gain from 290 (11/03/2017) Total weight lost: 68.7 lbs (using 290, pt reported weight) Weight loss goal: alleviate some knee and back pain   TANITA  BODY COMP RESULTS  06/23/2017 08/04/2017 11/03/2017   BMI (kg/m^2) Pt declined Pt declined Pt declined   Fat Mass (lbs)      Fat Free Mass (lbs)      Total Body Water (lbs)       Pt states she does not eat in the morning sometimes. Pt states she does not eat until she gets hungry and that is usually around 12pm. Pt states she only eats meat once a day. Pt states she likes mozzarella sticks from [facility] and will eat them when she's out with her mom. Pt states she will eat them while driving. Pt states there is not anything else on the menu that he can have. Pt states she has been walking some and tries to get out of the house.  Pt states BS numbers average 95-110, recent A1c is 5.7.  Pt states she needs knee replacement surgery but needs to lose weight to have it. Pt states she has been around the same weight for the last month, between 288-290 lbs. Pt states she is doing well meeting daily protein and fluid goals. Pt states she wanted some potatoes and it did not agree with her stomach. Pt states she has not had grits or bread. Pt states she has been walking more, unable to go to pool but has membership. Pt states she has bowel movements daily and sometimes its every other day. Pt states she has  changed to bariatric multivitamin.  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 Pt states she does not celebrate holidays.   Preferred Learning Style:   No preference indicated   Learning Readiness:   Ready  Change in progress  24-hr recall: B (AM): cheese or slim jim (6g) or 1/2 protein shake (15g) + cheese (7g)  Snk (AM): none  L (12-1 PM): protein2o water or protein shake (30g) or 3 oz tuna (21g) + spinach or salad/green beans Snk (PM): mozzarella cheese sticks or cheese stick (6g) D (6 PM): 2 air fried chicken wings, cabbage or 3 oz pork chop (21g) + spinach Snk (PM): cheese puffs  Fluid intake: water with flavor packs (8 16.9 oz bottles), Powerade zero; 64+ ounces Estimated total protein intake: ~57 grams   Medications: See list Supplementation: ProCare multi chewable + 3 Viactiv   CBG monitoring: yes Average CBG per patient: 95-110 Last patient reported A1c: 5.7  Using straws: no Drinking while eating: no Having you been chewing well: yes Chewing/swallowing difficulties: no Changes in vision: no Changes to mood/headaches: no Hair loss/Changes to skin/Changes to nails: hair thinning, no, no Any difficulty focusing or concentrating: no Sweating: no Dizziness/Lightheaded: no Palpitations: no Carbonated beverages: no N/V/D/C/GAS: no, no, no, sometimes-taking Miralax, no Abdominal Pain: no Dumping syndrome: no Last Lap-Band fill: N/A  Recent physical activity:  Walking 30 min, 5 days/week  Progress Towards Goal(s):  In progress.  Handouts given during visit include:  none   Nutritional Diagnosis:  [place]-3.3 Overweight/obesity related to past poor dietary habits and physical inactivity as evidenced by patient w/ recent sleeve gastrectomy surgery following dietary guidelines for continued weight loss.    Intervention:  Nutrition education and counseling. Pt was counseled on how  to not focus on the scale and how her weight is changing and more on doing what is best for her body. Pt was counseled on ways to increase physical activity and also how to mindfully eat. RD discussed the importance of having a mental health professional along her journey and utilizing available resources. Pt was in agreement with goals listed.  Goals: - Check into mental health professional.  - Chew at least 30 times per bite or to applesauce consistency. - Listen to body for when body is satisfied.  - Practice mindful eating.  - Look into attending BELT at least once a week.  - Try to eat every 3-5 hours and within the first 2 hours of waking up.  - Decrease fried food intake.    Teaching Method Utilized:  Visual Auditory Hands on  Barriers to learning/adherence to lifestyle change: none identified  Demonstrated degree of understanding via:  Teach Back   Monitoring/Evaluation:  Dietary intake, exercise, lap band fills, and body weight. Follow up in 2 months for 9 month post-op visit.

## 2018-01-30 DIAGNOSIS — M1712 Unilateral primary osteoarthritis, left knee: Secondary | ICD-10-CM | POA: Diagnosis not present

## 2018-01-30 DIAGNOSIS — M1711 Unilateral primary osteoarthritis, right knee: Secondary | ICD-10-CM | POA: Diagnosis not present

## 2018-02-09 NOTE — Progress Notes (Signed)
ifo  Subjective:    Patient ID: Valerie Carter, female    DOB: 1961/06/09, 57 y.o.   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 Goal BMI is 38 for orthopedics to perform surgery - Reports using Norco sometimes at night that was provided by sports med clinic.  - Would like something to help with her pain more, reports that celebrex is working for her but she believes it is increasing her BP at home. This was provided by surgeon. She said it does help her pain but she is worried about her BP. Previously tried cymbalta, but she was nauseas on this, however unsure if that was from her sleeve gastrectomy. - she starts back to work soon and does not want to be in pain. She does not want to drive on opioids, such as the norco so she wants to cut this out  S/p sleeve gastrectomy: - patient doing well and slowly losing weight, has plateau'ed a little but does start back to work soon.  - Seen by nutrition, Valerie Carter, RD and her surgeon now bi-annually. Next appointment is in December.  -Collected by surgeon per patient: calcium, iron, vitamin B12, vitamin D, folate, and thiamine, Lipid panel   Smoking status reviewed  Review of Systems Per HPI, also denies changes in vision, chest pain, shortness of breath   Patient Active Problem List   Diagnosis Date Noted  . 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 Intramural leiomyoma of uterus 01/27/2016  . Mild depression ([place]) 09/25/2014  . Vitamin D  deficiency 10/19/2013  . Seasonal allergies 09/07/2011  . 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 Diet-controlled diabetes mellitus ([place]) 05/20/2010  . Low back pain 01/29/2010  . Hyperlipidemia LDL goal <70 10/01/2008  . Obesity 10/24/2006  . HYPERTENSION, BENIGN ESSENTIAL 09/26/2006     Objective:  BP 120/80   Pulse 80   Temp 98.1 F (36.7 C)   Ht 5' 4.5" (1.638 m)   Wt 287 lb 3.2 oz (130.3 kg)   SpO2 99%   BMI 48.54 kg/m  Vitals and nursing note reviewed  General: NAD, pleasant, using cane to help with walking Cardiac: RRR, normal heart sounds, no murmurs Respiratory: CTAB, normal effort Extremities: no edema or cyanosis. WWP. Skin: warm and dry, no rashes noted Neuro: alert and oriented, no focal deficits Psych: normal affect  Assessment & Plan:    HYPERTENSION, BENIGN ESSENTIAL Patient recently taken off all medications, BP 120/80 today. She was started on celebrex per her surgeon to aid with her BL knee pain. Patient still needs to lose weight to have TKR performed. She is currently at a BMI of 48 and goal is 38. She is still following diet and following with nutrition. However, once she started the celebrex, she was high reading on her cuff at home.   After discussing with pharmacy, we will start patient on low dose amlodipine 2.5mg  daily to help control her BP at home while  on celebrex, which is one of the few medications that has helped with her pain. This way she can also hopefully have more weight loss due to better pain control.   DJD (degenerative joint disease) of knee Patient still needs to lose weight to have TKR performed. She is currently at a BMI of 48 and goal is 38 for orthopedics to perform surgery. She is still following diet and following with nutrition.  She reports the celebrex started by her surgeon has been helping. She also thinks that the duloxetine previously helped her, although she had some nausea with it, but unsure if that was  related to her surgery. We will continue with celebrex (not on ASA so likely does not need prophylaxis for GI bleed). We will start her on duloxetine to try again as it helped her with her pain. She will start with 30mg  daily for 1 week and then go to 60mg .   Patient is also using Norco sometimes before bed, but does not want to be on this long term.  Diet-controlled diabetes mellitus ([place]) Will check A1c again today to ensure good control off all medications. Last reported A1c by patient in May was 5.7. She is still having some slow weight gain and following with nutrition. Checking CBG's at home are usually around 100-140.   S/P laparoscopic sleeve gastrectomy Patient continues to work on diet and is followed by RD. BMP, CBC and A1c collected today.   Has the regular checks with surgeon who collects calcium, iron, vitamin B12, vitamin D, folate, and thiamine, Lipid panel every 6 months.   Reports surgeon recently d/c'd her aspirin.    Valerie Kiearra Oyervides, DO Family Medicine Resident PGY-2

## 2018-02-10 ENCOUNTER — Other Ambulatory Visit: Payer: Self-pay

## 2018-02-10 ENCOUNTER — Ambulatory Visit: Payer: BLUE CROSS/BLUE SHIELD | Admitting: Family Medicine

## 2018-02-10 ENCOUNTER — Encounter: Payer: Self-pay | Admitting: Family Medicine

## 2018-02-10 VITALS — BP 120/80 | HR 80 | Temp 98.1°F | Ht 64.5 in | Wt 287.2 lb

## 2018-02-10 DIAGNOSIS — I10 Essential (primary) hypertension: Secondary | ICD-10-CM

## 2018-02-10 DIAGNOSIS — E119 Type 2 diabetes mellitus without complications: Secondary | ICD-10-CM | POA: Diagnosis not present

## 2018-02-10 DIAGNOSIS — D509 Iron deficiency anemia, unspecified: Secondary | ICD-10-CM

## 2018-02-10 DIAGNOSIS — Z9884 Bariatric surgery status: Secondary | ICD-10-CM

## 2018-02-10 DIAGNOSIS — Z1211 Encounter for screening for malignant neoplasm of colon: Secondary | ICD-10-CM

## 2018-02-10 DIAGNOSIS — Z1231 Encounter for screening mammogram for malignant neoplasm of breast: Secondary | ICD-10-CM

## 2018-02-10 DIAGNOSIS — M170 Bilateral primary osteoarthritis of knee: Secondary | ICD-10-CM

## 2018-02-10 DIAGNOSIS — Z1239 Encounter for other screening for malignant neoplasm of breast: Secondary | ICD-10-CM

## 2018-02-10 LAB — POCT GLYCOSYLATED HEMOGLOBIN (HGB A1C): HbA1c, POC (controlled diabetic range): 5.5 % (ref 0.0–7.0)

## 2018-02-10 MED ORDER — DULOXETINE HCL 30 MG PO CPEP: 30.0000 mg | ORAL_CAPSULE | Freq: Every day | ORAL | 3 refills | Status: DC

## 2018-02-10 MED ORDER — FERROUS SULFATE 325 (65 FE) MG PO TABS: ORAL_TABLET | ORAL | 11 refills | Status: DC

## 2018-02-10 MED ORDER — AMLODIPINE BESYLATE 2.5 MG PO TABS: 2.5000 mg | ORAL_TABLET | Freq: Every day | ORAL | 3 refills | Status: DC

## 2018-02-10 NOTE — Assessment & Plan Note (Signed)
Will check A1c again today to ensure good control off all medications. Last reported A1c by patient in May was 5.7. She is still having some slow weight gain and following with nutrition. Checking CBG's at home are usually around 100-140.

## 2018-02-10 NOTE — Assessment & Plan Note (Signed)
Patient still needs to lose weight to have TKR performed. She is currently at a BMI of 48 and goal is 38 for orthopedics to perform surgery. She is still following diet and following with nutrition.  She reports the celebrex started by her surgeon has been helping. She also thinks that the duloxetine previously helped her, although she had some nausea with it, but unsure if that was related to her surgery. We will continue with celebrex (not on ASA so likely does not need prophylaxis for GI bleed). We will start her on duloxetine to try again as it helped her with her pain. She will start with 30mg  daily for 1 week and then go to 60mg .   Patient is also using Norco sometimes before bed, but does not want to be on this long term.

## 2018-02-10 NOTE — Assessment & Plan Note (Addendum)
Patient recently taken off all medications, BP 120/80 today. She was started on celebrex per her surgeon to aid with her BL knee pain. Patient still needs to lose weight to have TKR performed. She is currently at a BMI of 48 and goal is 38. She is still following diet and following with nutrition. However, once she started the celebrex, she was high reading on her cuff at home.   After discussing with pharmacy, we will start patient on low dose amlodipine 2.5mg  daily to help control her BP at home while on celebrex, which is one of the few medications that has helped with her pain. This way she can also hopefully have more weight loss due to better pain control.

## 2018-02-10 NOTE — Assessment & Plan Note (Signed)
>>  ASSESSMENT AND PLAN FOR ARTHRITIS OF KNEE, DEGENERATIVE WRITTEN ON 02/10/2018 10:00 AM BY SHIRLEY, JORDAN, DO  Patient still needs to lose weight to have TKR performed. She is currently at a BMI of 48 and goal is 38 for orthopedics to perform surgery. She is still following diet and following with nutrition.  She reports the celebrex started by her surgeon has been helping. She also thinks that the duloxetine  previously helped her, although she had some nausea with it, but unsure if that was related to her surgery. We will continue with celebrex (not on ASA so likely does not need prophylaxis for GI bleed). We will start her on duloxetine  to try again as it helped her with her pain. She will start with 30mg  daily for 1 week and then go to 60mg .   Patient is also using Norco sometimes before bed, but does not want to be on this long term.

## 2018-02-10 NOTE — Patient Instructions (Signed)
Thank you for coming to see me today. It was a pleasure! Today we talked about:   I have sent your new BP medication to the pharmacy. I have sent duloxetine as well to help with pain.   Please have your mammogram done. We will call you with your labs.   Please follow-up with me in 6 weeks or sooner as needed.  If you have any questions or concerns, please do not hesitate to call the office at [phone].  Take Care,   Martinique Ares Tegtmeyer, DO

## 2018-02-11 LAB — CBC
HEMATOCRIT: 37.7 % (ref 34.0–46.6)
HEMOGLOBIN: 12.3 g/dL (ref 11.1–15.9)
MCH: 26.5 pg — ABNORMAL LOW (ref 26.6–33.0)
MCHC: 32.6 g/dL (ref 31.5–35.7)
MCV: 81 fL (ref 79–97)
Platelets: 280 10*3/uL (ref 150–450)
RBC: 4.65 x10E6/uL (ref 3.77–5.28)
RDW: 14.9 % (ref 12.3–15.4)
WBC: 6.2 10*3/uL (ref 3.4–10.8)

## 2018-02-11 LAB — BASIC METABOLIC PANEL
BUN/Creatinine Ratio: 24 — ABNORMAL HIGH (ref 9–23)
BUN: 17 mg/dL (ref 6–24)
CALCIUM: 10.1 mg/dL (ref 8.7–10.2)
CO2: 24 mmol/L (ref 20–29)
CREATININE: 0.72 mg/dL (ref 0.57–1.00)
Chloride: 104 mmol/L (ref 96–106)
GFR calc Af Amer: 108 mL/min/{1.73_m2} (ref >59)
GFR, EST NON AFRICAN AMERICAN: 93 mL/min/{1.73_m2} (ref >59)
Glucose: 80 mg/dL (ref 65–99)
Potassium: 4.6 mmol/L (ref 3.5–5.2)
Sodium: 140 mmol/L (ref 134–144)

## 2018-02-13 NOTE — Assessment & Plan Note (Signed)
Patient continues to work on diet and is followed by RD. BMP, CBC and A1c collected today.   Has the regular checks with surgeon who collects calcium, iron, vitamin B12, vitamin D, folate, and thiamine, Lipid panel every 6 months.   Reports surgeon recently d/c'd her aspirin.

## 2018-03-04 DIAGNOSIS — M25561 Pain in right knee: Secondary | ICD-10-CM | POA: Diagnosis not present

## 2018-03-04 DIAGNOSIS — M1712 Unilateral primary osteoarthritis, left knee: Secondary | ICD-10-CM | POA: Diagnosis not present

## 2018-03-04 DIAGNOSIS — M25562 Pain in left knee: Secondary | ICD-10-CM | POA: Diagnosis not present

## 2018-03-04 DIAGNOSIS — M1711 Unilateral primary osteoarthritis, right knee: Secondary | ICD-10-CM | POA: Diagnosis not present

## 2018-03-07 ENCOUNTER — Telehealth: Payer: Self-pay | Admitting: *Deleted

## 2018-03-08 MED ORDER — HYDROCODONE-ACETAMINOPHEN 10-325 MG PO TABS: ORAL_TABLET | ORAL | 0 refills | Status: DC

## 2018-03-08 NOTE — Telephone Encounter (Signed)
[name] I have sent in a refill. I will need to see her before next refill THANKS! [name]

## 2018-03-09 ENCOUNTER — Ambulatory Visit: Payer: BLUE CROSS/BLUE SHIELD | Admitting: Registered"

## 2018-03-09 ENCOUNTER — Emergency Department (HOSPITAL_COMMUNITY)
Admission: EM | Admit: 2018-03-09 | Discharge: 2018-03-09 | Disposition: A | Discharge status: Home / Self Care | Payer: BLUE CROSS/BLUE SHIELD | Attending: Emergency Medicine | Admitting: Emergency Medicine

## 2018-03-09 ENCOUNTER — Emergency Department (HOSPITAL_COMMUNITY): Payer: BLUE CROSS/BLUE SHIELD

## 2018-03-09 ENCOUNTER — Other Ambulatory Visit: Payer: Self-pay

## 2018-03-09 ENCOUNTER — Encounter (HOSPITAL_COMMUNITY): Payer: Self-pay | Admitting: Emergency Medicine

## 2018-03-09 DIAGNOSIS — R1032 Left lower quadrant pain: Secondary | ICD-10-CM | POA: Diagnosis not present

## 2018-03-09 DIAGNOSIS — K5792 Diverticulitis of intestine, part unspecified, without perforation or abscess without bleeding: Secondary | ICD-10-CM | POA: Diagnosis not present

## 2018-03-09 DIAGNOSIS — Z79899 Other long term (current) drug therapy: Secondary | ICD-10-CM | POA: Diagnosis not present

## 2018-03-09 DIAGNOSIS — E119 Type 2 diabetes mellitus without complications: Secondary | ICD-10-CM | POA: Diagnosis not present

## 2018-03-09 DIAGNOSIS — R109 Unspecified abdominal pain: Secondary | ICD-10-CM | POA: Diagnosis not present

## 2018-03-09 LAB — URINALYSIS, ROUTINE W REFLEX MICROSCOPIC
Bilirubin Urine: NEGATIVE
Glucose, UA: NEGATIVE mg/dL
Hgb urine dipstick: NEGATIVE
KETONES UR: NEGATIVE mg/dL
Leukocytes, UA: NEGATIVE
NITRITE: NEGATIVE
PH: 7 (ref 5.0–8.0)
Protein, ur: NEGATIVE mg/dL
Specific Gravity, Urine: 1.009 (ref 1.005–1.030)

## 2018-03-09 LAB — CBC
HCT: 43.8 % (ref 36.0–46.0)
Hemoglobin: 14.3 g/dL (ref 12.0–15.0)
MCH: 26.9 pg (ref 26.0–34.0)
MCHC: 32.6 g/dL (ref 30.0–36.0)
MCV: 82.3 fL (ref 78.0–100.0)
PLATELETS: 249 10*3/uL (ref 150–400)
RBC: 5.32 MIL/uL — AB (ref 3.87–5.11)
RDW: 13.7 % (ref 11.5–15.5)
WBC: 8.8 10*3/uL (ref 4.0–10.5)

## 2018-03-09 LAB — COMPREHENSIVE METABOLIC PANEL
ALK PHOS: 74 U/L (ref 38–126)
ALT: 30 U/L (ref 0–44)
AST: 27 U/L (ref 15–41)
Albumin: 3.7 g/dL (ref 3.5–5.0)
Anion gap: 13 (ref 5–15)
BILIRUBIN TOTAL: 1.2 mg/dL (ref 0.3–1.2)
BUN: 21 mg/dL — ABNORMAL HIGH (ref 6–20)
CALCIUM: 10.2 mg/dL (ref 8.9–10.3)
CO2: 24 mmol/L (ref 22–32)
CREATININE: 0.63 mg/dL (ref 0.44–1.00)
Chloride: 102 mmol/L (ref 98–111)
Glucose, Bld: 103 mg/dL — ABNORMAL HIGH (ref 70–99)
Potassium: 4.1 mmol/L (ref 3.5–5.1)
Sodium: 139 mmol/L (ref 135–145)
TOTAL PROTEIN: 8.3 g/dL — AB (ref 6.5–8.1)

## 2018-03-09 LAB — LIPASE, BLOOD: Lipase: 34 U/L (ref 11–51)

## 2018-03-09 MED ORDER — CIPROFLOXACIN HCL 500 MG PO TABS: 500.0000 mg | ORAL_TABLET | Freq: Two times a day (BID) | ORAL | 0 refills | Status: DC

## 2018-03-09 MED ORDER — METRONIDAZOLE 500 MG PO TABS: 500.0000 mg | ORAL_TABLET | Freq: Two times a day (BID) | ORAL | 0 refills | Status: DC

## 2018-03-09 MED ORDER — SODIUM CHLORIDE 0.9 % IV BOLUS
500.0000 mL | Freq: Once | INTRAVENOUS | Status: AC
  Administered 2018-03-09: 500 mL via INTRAVENOUS

## 2018-03-09 NOTE — ED Provider Notes (Signed)
[facility] DEPT Provider Note   CSN: [id number] Arrival date & time: 03/09/18  7564     History   Chief Complaint Chief Complaint  Patient presents with  . Abdominal Pain    HPI DETTA Carter is a 57 y.o. female.  Pt presents to the ED today with LLQ abd pain.  Pt said it radiates to her back.  She denies f/c, but does have some urinary sx.  She feels like she is having normal bowel movements.  She denies n/v.  She did have a sleeve gastrectomy in December of 2018, and has done well since that surgery.  No complications.     Past Medical History:  Diagnosis Date  . Back pain   . Carpal tunnel syndrome    while driving both hands  . Diabetes mellitus without complication ([place])    type 2  . DJD (degenerative joint disease)    both knees right worse than left  . Headache   . Hyperlipidemia   . Hypertension   . Intramural leiomyoma of uterus   . Knee pain   . Morbid obesity with BMI of 50.0-59.9, adult ([place])   . Vitamin D deficiency     Patient Active Problem List   Diagnosis Date Noted  . 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 Intramural leiomyoma of uterus 01/27/2016  . Mild depression ([place]) 09/25/2014  . Vitamin D deficiency 10/19/2013  . Seasonal allergies 09/07/2011  . 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 Diet-controlled diabetes mellitus ([place]) 05/20/2010  . Low back pain 01/29/2010  . Hyperlipidemia LDL goal <70 10/01/2008  . Obesity 10/24/2006  . HYPERTENSION, BENIGN ESSENTIAL 09/26/2006    Past Surgical History:  Procedure Laterality Date  . KNEE SURGERY Right 1999   torn cartlidge repair  . LAPAROSCOPIC GASTRIC SLEEVE RESECTION N/A 06/07/2017   Procedure: LAPAROSCOPIC GASTRIC SLEEVE RESECTION, UPPER ENDOSCOPY;  Surgeon: Kieth Brightly, Arta Bruce, MD;  Location: [facility];  Service: General;  Laterality: N/A;     OB History    Gravida  5   Para  0   Term  0   Preterm  0   AB  5   Living  0     SAB  5   TAB      Ectopic      Multiple      Live Births               Home Medications    Prior to Admission medications   Medication Sig Start Date End Date Taking? Authorizing Provider  Acetaminophen (TYLENOL ARTHRITIS EXT RELIEF PO) Take by mouth daily.    [provider]  amLODipine (NORVASC) 2.5 MG tablet Take 1 tablet (2.5 mg total) by mouth daily. 02/10/18   Shirley, [place], DO  Ascorbic Acid (VITAMIN C PO) Take 1 tablet by mouth daily.    [provider]  Blood Glucose Monitoring Suppl (ONE TOUCH ULTRA SYSTEM KIT) W/DEVICE KIT 1 kit by Does not apply route once. 07/06/13   Piloto [place], MD  Calcium Carbonate-Vit D-Min (CALCIUM 1200 PO) Take 1 tablet by mouth 2 (two) times daily.    [provider]  ciprofloxacin (CIPRO) 500 MG tablet Take 1 tablet (500 mg total) by mouth 2 (two) times daily. 03/09/18   Isla Pence, MD  colchicine 0.6 MG tablet Please take 1.23m on 01/20/18 and then 0.623m1 hour later. On 01/21/18 start using 0.57m457m  once daily until your flare resolves. 01/20/18   Caroline More, DO  DULoxetine (CYMBALTA) 30 MG capsule Take 1 capsule (30 mg total) by mouth daily. For 1 week take 48m and then take 2 tabs at 632m8/16/19   ShEnid DerryJoMartiniqueDO  ferrous sulfate 325 (65 FE) MG tablet TAKE ONE TABLET BY MOUTH ONCE DAILY WITH BREAKFAST 02/10/18   Shirley, JoMartiniqueDO  glucose blood test strip Use as instructed Patient not taking: Reported on 01/26/2018 07/06/13   Piloto de LaGwendalyn EgeDaGlennon MacMD  HYDROcodone-acetaminophen ([facility]-325 MG tablet Take one AM and at lunch and 2 qhs prn knee pain 03/08/18   NeDickie LaMD  metroNIDAZOLE (FLAGYL) 500 MG tablet Take 1 tablet (500 mg total) by mouth 2 (two) times daily. 03/09/18   HaIsla PenceMD  Multiple Vitamin (MULTIVITAMIN WITH MINERALS) TABS tablet Take 1 tablet by mouth 2 (two) times daily.    [provider]  OMEGA-3 FATTY  ACIDS PO Take 1 capsule by mouth daily.     [provider]  polyethylene glycol powder (GLYCOLAX/MIRALAX) powder Take 17 g daily as needed by mouth. Patient taking differently: Take 17 g by mouth daily as needed (FOR CONSTIPATION.).  05/02/17   Shirley, JoMartiniqueDO  TURMERIC PO Take 750 mg by mouth 2 (two) times daily.    [provider]    Family History Family History  Problem Relation Age of Onset  . Diabetes Sister   . Hypertension Sister   . Breast cancer Maternal Aunt   . Diabetes Maternal Aunt   . Diabetes Father     Social History Social History   Tobacco Use  . Smoking status: Never Smoker  . Smokeless tobacco: Never Used  Substance Use Topics  . Alcohol use: No    Alcohol/week: 0.0 standard drinks    Frequency: Never  . Drug use: No     Allergies   Patient has no known allergies.   Review of Systems Review of Systems  Gastrointestinal: Positive for abdominal pain.  All other systems reviewed and are negative.    Physical Exam Updated Vital Signs BP (!) 161/112 (BP Location: Right Arm)   Pulse 71   Temp 98 F (36.7 C) (Oral)   Resp 18   Ht _0  (1.626 m)   Wt 127.5 kg   SpO2 99%   BMI 48.23 kg/m   Physical Exam  Constitutional: She is oriented to person, place, and time. She appears well-developed and well-nourished.  HENT:  Head: Normocephalic and atraumatic.  Mouth/Throat: Oropharynx is clear and moist.  Eyes: Pupils are equal, round, and reactive to light. EOM are normal.  Cardiovascular: Normal rate, regular rhythm, normal heart sounds and intact distal pulses.  Pulmonary/Chest: Effort normal and breath sounds normal.  Abdominal: Normal appearance and bowel sounds are normal. There is tenderness in the left lower quadrant.  Neurological: She is alert and oriented to person, place, and time.  Skin: Skin is warm and dry. Capillary refill takes less than 2 seconds.  Psychiatric: She has a normal mood and affect. Her behavior  is normal.  Nursing note and vitals reviewed.    ED Treatments / Results  Labs (all labs ordered are listed, but only abnormal results are displayed) Labs Reviewed  COMPREHENSIVE METABOLIC PANEL - Abnormal; Notable for the following components:      Result Value   Glucose, Bld 103 (*)    BUN 21 (*)    Total Protein 8.3 (*)  All other components within normal limits  CBC - Abnormal; Notable for the following components:   RBC 5.32 (*)    All other components within normal limits  LIPASE, BLOOD  URINALYSIS, ROUTINE W REFLEX MICROSCOPIC    EKG None  Radiology Dg Abdomen Acute W/chest  Result Date: 03/09/2018 CLINICAL DATA:  Abdominal pain. EXAM: DG ABDOMEN ACUTE W/ 1V CHEST COMPARISON:  Chest x-ray dated 02/25/2017 and upper GI dated 02/25/2017 FINDINGS: There is no evidence of dilated bowel loops or free intraperitoneal air. No radiopaque calculi or other significant radiographic abnormality is seen. Surgical staples in the left upper quadrant. Heart size and mediastinal contours are within normal limits. Both lungs are clear. IMPRESSION: Benign-appearing abdomen and pelvis and chest. Electronically Signed   By: Lorriane Shire M.D.   On: 03/09/2018 08:14    Procedures Procedures (including critical care time)  Medications Ordered in ED Medications  sodium chloride 0.9 % bolus 500 mL (500 mLs Intravenous New Bag/Given 03/09/18 0809)     Initial Impression / Assessment and Plan / ED Course  I have reviewed the triage vital signs and the nursing notes.  Pertinent labs & imaging results that were available during my care of the patient were reviewed by me and considered in my medical decision making (see chart for details).     Pt does have LLQ abd tenderness c/w possible diverticulitis.  We talked about getting a ct abd/pelvis and she wants to avoid this for now.  Labs nl.  Xray nl.  She does not have a fever or n/v.   She will be treated for possible diverticulitis with  cipro and flagyl and knows to return if worse.  F/u with pcp.  Final Clinical Impressions(s) / ED Diagnoses   Final diagnoses:  Diverticulitis    ED Discharge Orders         Ordered    ciprofloxacin (CIPRO) 500 MG tablet  2 times daily     03/09/18 0827    metroNIDAZOLE (FLAGYL) 500 MG tablet  2 times daily     03/09/18 0827           Isla Pence, MD 03/09/18 [id number]

## 2018-03-09 NOTE — ED Notes (Signed)
Discharge instructions reviewed with patient. Patient verbalizes understanding. VSS.   

## 2018-03-16 ENCOUNTER — Ambulatory Visit: Payer: BLUE CROSS/BLUE SHIELD | Admitting: Family Medicine

## 2018-03-16 ENCOUNTER — Other Ambulatory Visit: Payer: Self-pay

## 2018-03-16 DIAGNOSIS — K5900 Constipation, unspecified: Secondary | ICD-10-CM

## 2018-03-16 DIAGNOSIS — R399 Unspecified symptoms and signs involving the genitourinary system: Secondary | ICD-10-CM

## 2018-03-16 LAB — POCT URINALYSIS DIP (MANUAL ENTRY)
BILIRUBIN UA: NEGATIVE
BILIRUBIN UA: NEGATIVE mg/dL
Blood, UA: NEGATIVE
Glucose, UA: NEGATIVE mg/dL
LEUKOCYTES UA: NEGATIVE
Nitrite, UA: NEGATIVE
PH UA: 6 (ref 5.0–8.0)
Protein Ur, POC: NEGATIVE mg/dL
SPEC GRAV UA: 1.025 (ref 1.010–1.025)
Urobilinogen, UA: 0.2 E.U./dL

## 2018-03-16 MED ORDER — POLYETHYLENE GLYCOL 3350 17 GM/SCOOP PO POWD: 17.0000 g | Freq: Every day | ORAL | 3 refills | Status: DC

## 2018-03-16 MED ORDER — CEPHALEXIN 500 MG PO CAPS: 500.0000 mg | ORAL_CAPSULE | Freq: Two times a day (BID) | ORAL | 0 refills | Status: AC

## 2018-03-16 MED ORDER — DOCUSATE SODIUM 250 MG PO CAPS: 250.0000 mg | ORAL_CAPSULE | Freq: Every day | ORAL | 0 refills | Status: DC

## 2018-03-16 NOTE — Patient Instructions (Signed)
It was nice meeting you today Valerie Carter!  For your presumed UTI, please take Keflex twice per day for ten days.  If your symptoms have not resolved by the end of your antibiotic course, please return to the office - you may need a referral to a urologist at that time.  For your constipation, please take Miralax and Colace daily.  You can take more or less of these medications to get stool to a snake-like consistency.  If you have any questions or concerns, please feel free to call the clinic.   Be well,  Dr. Shan Levans

## 2018-03-16 NOTE — Progress Notes (Signed)
Subjective:    Valerie Carter - 57 y.o. female MRN [id number]  Date of birth: [birth year]  HPI  Valerie Carter is here for follow-up of a visit to [facility] long the emergency department for abdominal pain, where she was diagnosed with likely diverticulitis and given Cipro and Flagyl. - had dysuria and pelvic pain in the ED and now - pain has improved, but it still radiates to her L flank - dysuria has continued.  Abdominal pain and dysuria is relieved after she pees. - total duration of symptoms has been two weeks - finished antibiotics today - gastric sleeve surgery June 07 2017, and wants to make sure that her stomach sleeve is not involved in this pain - sometimes gets constipated, and she takes Miralax occasionally - stool consistency is "like little balls" -Patient is confident that this is a UTI because it is similar to the UTIs she has had in the past, and she does not think that it is something like a fissure or painful hemorrhoid because the pain is more pelvic rather than on the surface of her perineum    Health Maintenance:  Health Maintenance Due  Topic Date Due  . COLONOSCOPY  11/18/2010  . OPHTHALMOLOGY EXAM  02/06/2016  . URINE MICROALBUMIN  02/06/2016  . MAMMOGRAM  07/26/2016  . FOOT EXAM  07/23/2017  . INFLUENZA VACCINE  01/26/2018  . LIPID PANEL  02/08/2018    -  reports that she has never smoked. She has never used smokeless tobacco. - Review of Systems: Per HPI. - Past Medical History: Patient Active Problem List   Diagnosis Date Noted  . Constipation 03/16/2018  . 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 UTI symptoms 12/14/2016  . Intramural leiomyoma of uterus 01/27/2016  . Mild depression ([place]) 09/25/2014  . Vitamin D deficiency 10/19/2013  . Seasonal allergies 09/07/2011  . 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 Diet-controlled diabetes mellitus ([place]) 05/20/2010  . Low back pain 01/29/2010  .  Hyperlipidemia LDL goal <70 10/01/2008  . Obesity 10/24/2006  . HYPERTENSION, BENIGN ESSENTIAL 09/26/2006   - Medications: reviewed and updated   Objective:   Physical Exam There were no vitals taken for this visit. Gen: NAD, alert, cooperative with exam, well-appearing HEENT: NCAT, PERRL, clear conjunctiva, oropharynx clear, supple neck CV: RRR, good S1/S2, no murmur, no edema, capillary refill brisk  Resp: CTABL, no wheezes, non-labored Abd: SNTND, BS present, no guarding or organomegaly Skin: no rashes, normal turgor  Neuro: no gross deficits.  Psych: good insight, alert and oriented        Assessment & Plan:   UTI symptoms Although Ms. Verma's UAs both today and in the emergency department appear normal, patient is convinced that this is a UTI, so we will try a 10-day course of Keflex 500 mg twice daily.  Patient was told that if her symptoms do not resolve after this antibiotic course, she may need a referral to urology because she may have some type of inflammation in her bladder.  Constipation Type of patient stools and high stool burden visualized on KUB in the emergency department consistent with constipation.  Patient was advised to continue drinking plenty of water, eating foods with high fiber content such as fruit and vegetables, and was prescribed MiraLAX and Colace.  She was advised to titrate these medications as needed to achieve stools with a snake like consistency.    Maia Breslow, M.D. 03/16/2018, 11:14 AM PGY-2,  [place]

## 2018-03-16 NOTE — Assessment & Plan Note (Signed)
Although Valerie Carter's UAs both today and in the emergency department appear normal, patient is convinced that this is a UTI, so we will try a 10-day course of Keflex 500 mg twice daily.  Patient was told that if her symptoms do not resolve after this antibiotic course, she may need a referral to urology because she may have some type of inflammation in her bladder.

## 2018-03-16 NOTE — Assessment & Plan Note (Signed)
Type of patient stools and high stool burden visualized on KUB in the emergency department consistent with constipation.  Patient was advised to continue drinking plenty of water, eating foods with high fiber content such as fruit and vegetables, and was prescribed MiraLAX and Colace.  She was advised to titrate these medications as needed to achieve stools with a snake like consistency.

## 2018-03-24 ENCOUNTER — Emergency Department (HOSPITAL_COMMUNITY)
Admission: EM | Admit: 2018-03-24 | Discharge: 2018-03-24 | Disposition: A | Discharge status: Home / Self Care | Payer: BLUE CROSS/BLUE SHIELD | Attending: Emergency Medicine | Admitting: Emergency Medicine

## 2018-03-24 ENCOUNTER — Other Ambulatory Visit: Payer: Self-pay

## 2018-03-24 ENCOUNTER — Encounter (HOSPITAL_COMMUNITY): Payer: Self-pay

## 2018-03-24 DIAGNOSIS — M545 Low back pain, unspecified: Secondary | ICD-10-CM

## 2018-03-24 DIAGNOSIS — Z79899 Other long term (current) drug therapy: Secondary | ICD-10-CM | POA: Diagnosis not present

## 2018-03-24 DIAGNOSIS — E119 Type 2 diabetes mellitus without complications: Secondary | ICD-10-CM | POA: Diagnosis not present

## 2018-03-24 DIAGNOSIS — M25551 Pain in right hip: Secondary | ICD-10-CM | POA: Diagnosis not present

## 2018-03-24 DIAGNOSIS — Z6841 Body Mass Index (BMI) 40.0 and over, adult: Secondary | ICD-10-CM | POA: Diagnosis not present

## 2018-03-24 DIAGNOSIS — I10 Essential (primary) hypertension: Secondary | ICD-10-CM | POA: Insufficient documentation

## 2018-03-24 MED ORDER — CYCLOBENZAPRINE HCL 10 MG PO TABS
10.0000 mg | ORAL_TABLET | Freq: Once | ORAL | Status: AC
  Administered 2018-03-24: 10 mg via ORAL
  Filled 2018-03-24: qty 1

## 2018-03-24 MED ORDER — CYCLOBENZAPRINE HCL 10 MG PO TABS: 10.0000 mg | ORAL_TABLET | Freq: Two times a day (BID) | ORAL | 0 refills | Status: AC | PRN

## 2018-03-24 MED ORDER — KETOROLAC TROMETHAMINE 60 MG/2ML IM SOLN
60.0000 mg | Freq: Once | INTRAMUSCULAR | Status: AC
  Administered 2018-03-24: 60 mg via INTRAMUSCULAR
  Filled 2018-03-24: qty 2

## 2018-03-24 NOTE — ED Provider Notes (Signed)
[facility] DEPT Provider Note  CSN: [id number] Arrival date & time: 03/24/18  1636    History   Chief Complaint Chief Complaint  Patient presents with  . Back Pain  . Leg Pain    HPI Valerie Carter is a 57 y.o. female with a medical history of Type 2 DM, HTN and OA who presented to the ED for back pain x4 days. Patient describes throbbing, aching in her right low back. Pain does radiates to right lower extremity in the front. She also describes right hip pain which is also aching and feels stiff. Pains are worse with ambulation. Patient reports pain began after she bent over to pick up a broom and states that she felt like she overstretched a muscle. Denies any other trauma, falls or injuries. Relief is achieved with rest. Denies fever, other arthralgias, skin rashes/lesions, bowel or bladder incontinence, saddle anesthesia, paresthesias, weakness or gait difficulties. Patient has orthopedic history of bilateral knee pain and is scheduled to have these replaced, but has to get her BMI down to 37-38 prior to surgery. Patient has tried nothing for pain relief prior to coming to the ED.  Past Medical History:  Diagnosis Date  . Back pain   . Carpal tunnel syndrome    while driving both hands  . Diabetes mellitus without complication ([place])    type 2  . DJD (degenerative joint disease)    both knees right worse than left  . Headache   . Hyperlipidemia   . Hypertension   . Intramural leiomyoma of uterus   . Knee pain   . Morbid obesity with BMI of 50.0-59.9, adult ([place])   . Vitamin D deficiency     Patient Active Problem List   Diagnosis Date Noted  . Constipation 03/16/2018  . 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 UTI symptoms 12/14/2016  . Intramural leiomyoma of uterus 01/27/2016  . Mild depression ([place]) 09/25/2014  . Vitamin D deficiency 10/19/2013  . Seasonal allergies 09/07/2011  . DJD (degenerative joint  disease) of knee 10/29/2010  . Diet-controlled diabetes mellitus ([place]) 05/20/2010  . Low back pain 01/29/2010  . Hyperlipidemia LDL goal <70 10/01/2008  . Obesity 10/24/2006  . HYPERTENSION, BENIGN ESSENTIAL 09/26/2006    Past Surgical History:  Procedure Laterality Date  . KNEE SURGERY Right 1999   torn cartlidge repair  . LAPAROSCOPIC GASTRIC SLEEVE RESECTION N/A 06/07/2017   Procedure: LAPAROSCOPIC GASTRIC SLEEVE RESECTION, UPPER ENDOSCOPY;  Surgeon: Kieth Brightly, Arta Bruce, MD;  Location: [facility];  Service: General;  Laterality: N/A;     OB History    Gravida  5   Para  0   Term  0   Preterm  0   AB  5   Living  0     SAB  5   TAB      Ectopic      Multiple      Live Births               Home Medications    Prior to Admission medications   Medication Sig Start Date End Date Taking? Authorizing Provider  Acetaminophen (TYLENOL ARTHRITIS EXT RELIEF PO) Take by mouth daily.    [provider]  amLODipine (NORVASC) 2.5 MG tablet Take 1 tablet (2.5 mg total) by mouth daily. 02/10/18   Shirley, [place], DO  Ascorbic Acid (VITAMIN C PO) Take 1 tablet by mouth daily.    [provider]  Blood Glucose Monitoring Suppl (ONE TOUCH ULTRA SYSTEM KIT) W/DEVICE KIT 1 kit by Does not apply route once. 07/06/13   Piloto [place], MD  Calcium Carbonate-Vit D-Min (CALCIUM 1200 PO) Take 1 tablet by mouth 2 (two) times daily.    [provider]  cephALEXin (KEFLEX) 500 MG capsule Take 1 capsule (500 mg total) by mouth 2 (two) times daily for 10 days. 03/16/18 03/26/18  Kathrene Alu, MD  ciprofloxacin (CIPRO) 500 MG tablet Take 1 tablet (500 mg total) by mouth 2 (two) times daily. 03/09/18   Isla Pence, MD  colchicine 0.6 MG tablet Please take 1.67m on 01/20/18 and then 0.677m1 hour later. On 01/21/18 start using 0.59m3mnce daily until your flare resolves. 01/20/18   AbrCaroline MoreO  cyclobenzaprine (FLEXERIL) 10 MG tablet Take 1 tablet  (10 mg total) by mouth 2 (two) times daily as needed for up to 10 days for muscle spasms. 03/24/18 04/03/18  Mortis, GabAlvie Heidelberg PA-C  docusate sodium (COLACE) 250 MG capsule Take 1 capsule (250 mg total) by mouth daily. 03/16/18   WinKathrene AluD  DULoxetine (CYMBALTA) 30 MG capsule Take 1 capsule (30 mg total) by mouth daily. For 1 week take 56m31md then take 2 tabs at 60mg47m6/19   ShirlEnid DerrydaMartinique ferrous sulfate 325 (65 FE) MG tablet TAKE ONE TABLET BY MOUTH ONCE DAILY WITH BREAKFAST 02/10/18   Shirley, JordaMartinique glucose blood test strip Use as instructed Patient not taking: Reported on 01/26/2018 07/06/13   Piloto de La PaGwendalyn EgearGlennon Mac HYDROcodone-acetaminophen ([facility]ive[facility]325 MG tablet Take one AM and at lunch and 2 qhs prn knee pain 03/08/18   Neal,Dickie La metroNIDAZOLE (FLAGYL) 500 MG tablet Take 1 tablet (500 mg total) by mouth 2 (two) times daily. 03/09/18   HavilIsla Pence Multiple Vitamin (MULTIVITAMIN WITH MINERALS) TABS tablet Take 1 tablet by mouth 2 (two) times daily.    [provider]  OMEGA-3 FATTY ACIDS PO Take 1 capsule by mouth daily.     [provider]  polyethylene glycol powder (GLYCOLAX/MIRALAX) powder Take 17 g by mouth daily. 03/16/18   WinfrKathrene Alu TURMERIC PO Take 750 mg by mouth 2 (two) times daily.    [provider]   Family History Family History  Problem Relation Age of Onset  . Diabetes Sister   . Hypertension Sister   . Breast cancer Maternal Aunt   . Diabetes Maternal Aunt   . Diabetes Father     Social History Social History   Tobacco Use  . Smoking status: Never Smoker  . Smokeless tobacco: Never Used  Substance Use Topics  . Alcohol use: No    Alcohol/week: 0.0 standard drinks    Frequency: Never  . Drug use: No     Allergies   Patient has no known allergies.   Review of Systems Review of Systems  Constitutional: Negative.   Respiratory: Negative.   Cardiovascular: Negative.     Gastrointestinal: Negative.   Genitourinary: Negative.   Musculoskeletal: Positive for arthralgias and back pain. Negative for joint swelling, neck pain and neck stiffness.  Skin: Negative.    Physical Exam Updated Vital Signs BP (!) 175/89 (BP Location: Right Arm)   Pulse 77   Temp 98.7 F (37.1 C) (Oral)   Resp 16   Ht '5\' 4"'  (1.626 m)   Wt 123.8 kg   SpO2 100%  BMI 46.86 kg/m   Physical Exam  Constitutional: No distress.  Obese. Lying comfortably in bed on her left side.  Musculoskeletal:  Full ROM of lower extremities bilaterally with 5/5 strength. Pain with hip flexion and rotation. Able to bear weight and ambulate without assistance. Right lumbar paraspinal muscular pain. Negative straight leg raises. No midline tenderness.  Neurological: She has normal strength. No sensory deficit. She exhibits normal muscle tone. Gait normal.  Unable to test lower extremity DTRs due to body habitus.  Skin: Skin is warm. Capillary refill takes less than 2 seconds. No rash noted.  Nursing note and vitals reviewed.  ED Treatments / Results  Labs (all labs ordered are listed, but only abnormal results are displayed) Labs Reviewed - No data to display  EKG None  Radiology No results found.  Procedures Procedures (including critical care time)  Medications Ordered in ED Medications  ketorolac (TORADOL) injection 60 mg (60 mg Intramuscular Given 03/24/18 1753)  cyclobenzaprine (FLEXERIL) tablet 10 mg (10 mg Oral Given 03/24/18 1752)   Initial Impression / Assessment and Plan / ED Course  Triage vital signs and the nursing notes have been reviewed.  Pertinent labs & imaging results that were available during care of the patient were reviewed and considered in medical decision making (see chart for details). Clinical Course as of Mar 24 2017  Fri Mar 24, 2018  2016 Hypertensive in the ED and carries a diagnosis of HTN and is prescribed Norvasc. Patient admits to taking this  medication inconsistently.   [GM]    Clinical Course User Index [GM] Romie Jumper, [place]    Patient presents well appearing and in no acute distress. She is able to ambulate on her own and does so without assistance or issue. Patient has no previous history of past back injury, but history of bilateral knee OA that needs replacements. Physical exam is reassuring. With the exception of muscular tenderness and painful hip ROMs, her MSK and neuro exam is normal. There is no midline tenderness, deformities or abnormal neuro findings on exam or in history to suggest an acute spinal cord or osseus pathology that warrant imaging today. No systemic s/s to suggest underlying infectious or rheumatologic etiology.  Final Clinical Impressions(s) / ED Diagnoses  1. Right Low Back Pain. Relief achieved in the ED with Toradol and Flexeril. Rx for Flexeril prescribed for muscle spasm. Education provided on OTC and supportive treatment for relief. Advised to follow-up with her orthopedist, Dr. Berenice Primas. 2. Right Hip Pain. Likely due to OA. Relief achieved with Toradol today. Advised to follow-up with orthopedist as recommended above for back.  Dispo: Home. After thorough clinical evaluation, this patient is determined to be medically stable and can be safely discharged with the previously mentioned treatment and/or outpatient follow-up/referral(s). At this time, there are no other apparent medical conditions that require further screening, evaluation or treatment.  Final diagnoses:  Acute right-sided low back pain without sciatica  Right hip pain    ED Discharge Orders         Ordered    cyclobenzaprine (FLEXERIL) 10 MG tablet  2 times daily PRN     03/24/18 1958            Mortis, Hoover Brunette 03/24/18 2023    Carmin Muskrat, MD 03/24/18 [phone]

## 2018-03-24 NOTE — ED Triage Notes (Signed)
Patient c/o right low back/buttock pain that radiates down the right leg x 4 days.

## 2018-03-24 NOTE — Discharge Instructions (Addendum)
Based on your physical exam, I believe you have 2 separate things going on. 1) Muscle strain in your back and 2) Right hip pain that is causing the pain that is going down the front part of your leg. Both issues are treated with similar medications.  I have written you a prescription for Flexeril which is a muscle relaxer. This should help if you have muscle spasms or tightness. You may use Tylenol and/or Ibuprofen for pain relief and swelling. You may also use warm or cold compresses for additional relief.   As we discussed, weight loss and exercise will help alleviate some of the pressure that is being placed on your back and lower body. I have given you some back exercises to do daily.  Please follow-up with your orthopedist, Dr. Berenice Primas, if either of these issues continue to cause significant pain for more than 4-6 weeks.You may follow-up with your PCP if you continue to have issues for more than 4-6 weeks.  Take care of yourself and have a good weekend! Good luck with school!

## 2018-03-30 DIAGNOSIS — M25562 Pain in left knee: Secondary | ICD-10-CM | POA: Diagnosis not present

## 2018-03-30 DIAGNOSIS — M25561 Pain in right knee: Secondary | ICD-10-CM | POA: Diagnosis not present

## 2018-04-17 ENCOUNTER — Ambulatory Visit: Payer: BLUE CROSS/BLUE SHIELD | Admitting: Registered"

## 2018-04-20 ENCOUNTER — Ambulatory Visit: Payer: BLUE CROSS/BLUE SHIELD | Admitting: Skilled Nursing Facility1

## 2018-04-21 ENCOUNTER — Ambulatory Visit (INDEPENDENT_AMBULATORY_CARE_PROVIDER_SITE_OTHER): Payer: BLUE CROSS/BLUE SHIELD | Admitting: Family Medicine

## 2018-04-21 VITALS — BP 150/90 | Ht 64.5 in | Wt 271.0 lb

## 2018-04-21 DIAGNOSIS — M170 Bilateral primary osteoarthritis of knee: Secondary | ICD-10-CM | POA: Diagnosis not present

## 2018-04-21 MED ORDER — HYDROCODONE-ACETAMINOPHEN 10-325 MG PO TABS: ORAL_TABLET | ORAL | 0 refills | Status: DC

## 2018-04-24 ENCOUNTER — Encounter: Payer: Self-pay | Admitting: Skilled Nursing Facility1

## 2018-04-24 ENCOUNTER — Encounter: Payer: BLUE CROSS/BLUE SHIELD | Attending: General Surgery | Admitting: Skilled Nursing Facility1

## 2018-04-24 DIAGNOSIS — E119 Type 2 diabetes mellitus without complications: Secondary | ICD-10-CM

## 2018-04-24 DIAGNOSIS — Z713 Dietary counseling and surveillance: Secondary | ICD-10-CM | POA: Insufficient documentation

## 2018-04-24 NOTE — Progress Notes (Signed)
    CHIEF COMPLAINT / HPI: Follow-up bilateral knee pain.  She feels a little despondent and that her weight loss has plateaued.  She saw the orthopedist and underwent several series of injections with the not seem to help.  She needs to lose another 30 or 40 pounds replacement.  REVIEW OF SYSTEMS: No fever.  The pain continues at baseline.  PERTINENT  PMH / PSH: I have reviewed the patient's medications, allergies, past medical and surgical history, smoking status and updated in the EMR as appropriate.   OBJECTIVE: GENERAL: Well female no acute distress Knees: Bilaterally tender to palpation The right knee is also tender to palpation of the lateral joint line. Psych: Alert x4.  Affect is interactive.  ASSESSMENT / PLAN:  #1.  Knee pain. 2.  Obesity status post 1 weight loss  Long discussion with her.  I reviewed her critical weight loss over the last year.  Pounds.  I gave her a little.  50% of our 25 and office counseling education regarding this.  She is made great progress.  I will continue to for her narcotic pain medicine this time.  I suspect she will reach her weight goal within the next 6 months and then she can have TKR.  I will see her back in 1 to 2 months.

## 2018-04-24 NOTE — Patient Instructions (Signed)
-  Work on your exercises a total of 30 minutes a day 7 days a week  -Take the appropriate multivitamin   -Get in touch with BELT  -Continue to chew until applesauce consistency   -Continue to eat until satisfaction rather than fullness  -Continue to aim for protein every time you eat, non starchy vegetables every time you eat and check in with your food decisions in the moment to determine emotional eating

## 2018-04-24 NOTE — Progress Notes (Signed)
Follow-up visit: Post-Operative Sleeve gastretomy Surgery  Medical Nutrition Therapy:  Appt start time: 9:15 end time: 10:00.  Primary concerns today: Post-operative Bariatric Surgery Nutrition Management.  Pt states she does not celebrate holidays.   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   Pt states she went to the emergency room for a diverticulitis flare up. Pt states she has been struggling with constipation. Pt arrives with the use of a cain due to all over body pain including her knees and back; pt states she is averaging about 3-4 hours of sleep a night. Pt reports taking NSAIDS often due to her pain. Pt states she was given exercises to build up her thigh muscles for knee surgery but has not been doing them. Pt states she takes naps throughout the day. Pt states when she went to the emergency room she was told she was dehydrated.  Throughout the appt pt was not honest with what foods she is eating and how often she is eating them.  Pt is resistant to change for her physical activity as well dietary changes. Pt was under the impression having the surgery alone was enough to reach her health goals: Dietitian educated the pt on the surgery being one tool amongst other tools such as eating a balanced diet being physically active and working on her relationship with food. Dietitian counseled the pt on the use of a mental health professional pt responded I do not have any problems, I do not need one.   Pt admitted to being an emotional eater.  Non scale victories: clothes fitting better, less fatigue, reduced diabetes medications, blood sugar numbers are great, no longer taking blood pressure medications, from sizes 32/32-22/24  Surgery date: 06/07/2017 Surgery type: Sleeve gastrectomy [facility] weight at [facility]: 360.8 lbs Weight today: 292.1 lbs (pt reported) Weight change: 15 Weight loss goal: alleviate some  knee and back pain   TANITA  BODY COMP RESULTS  04/24/2018   BMI (kg/m^2) 46.8   Fat Mass (lbs) 148   Fat Free Mass (lbs) 129   Total Body Water (lbs) 95.2    24-hr recall: eating breakfast 5 days a week; 3 days a week with snacks  B (5 AM): coffee water and slim jim and cheese or yogurt Snk (AM): muffin from sheets (big)  L (12-1 PM): greens beans and salisbury steak Snk (PM): cheesestick and slim D (6 PM): collard greens and BBQ Snk (PM): cheese puffs and chips  Fluid intake: water with flavor packs (8-9 16.9 oz bottles), Powerade zero; 64+ ounces Estimated total protein intake: 60+ grams   Medications: See list Supplementation: gummies + 3 Viactiv   CBG monitoring: yes Average CBG per patient: 95-110 Last patient reported A1c: 5.7  Using straws: no Drinking while eating:  yes Having you been chewing well: yes Chewing/swallowing difficulties: no Changes in vision: no Changes to mood/headaches: no Hair loss/Changes to skin/Changes to nails: hair thinning, no, no Any difficulty focusing or concentrating: no Sweating: no Dizziness/Lightheaded: no Palpitations: no Carbonated beverages: no N/V/D/C/GAS: taking colace; bowel movement every 2 days  Abdominal Pain: no Dumping syndrome: no  Recent physical activity:  ADL's  Progress Towards Goal(s):  In progress.   Nutritional Diagnosis:  [place]-3.3 Overweight/obesity related to past poor dietary habits and physical inactivity as evidenced by patient w/ recent sleeve gastrectomy surgery following dietary guidelines for continued weight loss.    Intervention:  Nutrition education and counseling. Pt was counseled on how to not focus on the scale and how her weight is changing and more on doing what is best for her body. Pt was counseled on ways to increase physical activity and also how to mindfully eat. RD discussed the importance of having a mental health professional along her journey and utilizing available resources. Pt was  in agreement with goals listed.  Goals: -Work on your exercises a total of 30 minutes a day 7 days a week -Take the appropriate multivitamin  -Get in touch with BELT -Continue to chew until applesauce consistency  -Continue to eat until satisfaction rather than fullness -Continue to aim for protein every time you eat, non starchy vegetables every time you eat and check in with your food decisions in the moment to determine emotional eating  -Keep a log of your foods and drinks   Teaching Method Utilized:  Visual Auditory Hands on  Barriers to learning/adherence to lifestyle change: emotional eating  Demonstrated degree of understanding via:  Teach Back   Monitoring/Evaluation:  Dietary intake, exercise, lap band fills, and body weight.

## 2018-05-02 ENCOUNTER — Encounter: Payer: Self-pay | Admitting: Family Medicine

## 2018-05-02 ENCOUNTER — Other Ambulatory Visit: Payer: Self-pay

## 2018-05-02 ENCOUNTER — Ambulatory Visit (INDEPENDENT_AMBULATORY_CARE_PROVIDER_SITE_OTHER): Payer: BLUE CROSS/BLUE SHIELD | Admitting: Family Medicine

## 2018-05-02 DIAGNOSIS — M5441 Lumbago with sciatica, right side: Secondary | ICD-10-CM | POA: Diagnosis not present

## 2018-05-02 MED ORDER — PREDNISONE 20 MG PO TABS: 40.0000 mg | ORAL_TABLET | Freq: Every day | ORAL | 0 refills | Status: DC

## 2018-05-02 MED ORDER — GABAPENTIN 300 MG PO CAPS: 300.0000 mg | ORAL_CAPSULE | Freq: Three times a day (TID) | ORAL | 3 refills | Status: DC

## 2018-05-02 NOTE — Progress Notes (Signed)
Subjective:    Patient ID: Valerie Carter, female    DOB: October 24, 1960, 57 y.o.   MRN: [id number]   CC: back pain  HPI: R Low Back Pain with Sciatica: Patient reports that about a month ago she bent over on her back porch to pick up a broom and felt a sharp pain shooting in her back.  Patient reports that nothing she has tried has helped including Motrin, Tylenol, tramadol.  Patient has also been having hydrocodone at night that she gets from the sports medicine clinic.  Patient reports that she has not been able to sleep well at all at night.  Patient reports that laying on her left side is the only thing that allows her any relief.  Patient reports that the pain is constant.  Patient was previously seen and given muscle relaxers which did not help at all.  Patient reports that she has a history of sciatica on her left side but this pain feels worse.  Patient reports that the pain radiates down to her ankle and a sciatica distribution. Patient reports that she has a history of chronic low back pain where she received 3 injections in her back with the last injection being 11/2016.  Patient reports that she has had gabapentin in the past which helped with her sciatica at the time and that she was also going to try Lyrica but it was too expensive. Patient is also bus driver and must work and does not want to take anything to interfere with her job.  ROS: no bowel or bladder incontinence   MRI Lumbar spine w/o, 09/2016:  1. At L3-4 there is a broad-based disc bulge. Moderate bilateral facet arthropathy. Bilateral facet effusions. 5.6 mm intraspinal left facet synovial cyst with mass effect on the left posterior-lateral thecal sac. Moderate -severe spinal stenosis. Moderate right foraminal stenosis. Mild left foraminal stenosis. 2. At L2-3 there is a broad-based disc bulge. Moderate bilateral facet arthropathy. Mild-moderate spinal stenosis. Mild bilateral foraminal stenosis.  Smoking status  reviewed  ROS: 10 point ROS is otherwise negative, except as mentioned in HPI  Patient Active Problem List   Diagnosis Date Noted  . Constipation 03/16/2018  . 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 UTI symptoms 12/14/2016  . Intramural leiomyoma of uterus 01/27/2016  . Mild depression ([place]) 09/25/2014  . Vitamin D deficiency 10/19/2013  . Seasonal allergies 09/07/2011  . 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 Diet-controlled diabetes mellitus ([place]) 05/20/2010  . Low back pain 01/29/2010  . Hyperlipidemia LDL goal <70 10/01/2008  . Obesity 10/24/2006  . HYPERTENSION, BENIGN ESSENTIAL 09/26/2006     Objective:  BP (!) 144/100   Pulse (!) 112   Temp 98.1 F (36.7 C) (Oral)   Ht 5\' 5"  (1.651 m)   Wt 276 lb (125.2 kg)   SpO2 99%   BMI 45.93 kg/m  Vitals and nursing note reviewed  General: in moderate distress, pleasant Cardiac: RRR, normal heart sounds, no murmurs Respiratory: CTAB, normal effort Back: exam severely limited due to pain, very tender on exam Extremities: no edema or cyanosis. WWP. Patient unable to stand straight while walking to table for exam Skin: warm and dry, no rashes noted Neuro: alert and oriented, no focal deficits Psych: normal affect, patient tearful from pain  Assessment & Plan:    Low back pain Chronic low back pain exacerbated after bending over and aligns more with sciatica flare. Patient has no red flag symptoms.  Previously seen by pain clinic and received steroid injections. Will try previous therapies tried as patient is in much more pain than normal. -Will start with Gabapentin 300mg  twice daily, then may take 2 capsules (600mg  total) in the afternoon and 2 capsules in the evening. 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  -Will also trial Prednisone 40mg  for 5 days to see if this helps back pain, patient is diabetic and trying weight loss so will only do short course to see if  this helps her pain. -Patient to f/u in 1-2 weeks and if sx not improved or worsened, patient may require repeat MRI, previous results above and patient now with inciting event.     [place] Kathren Scearce, DO Family Medicine Resident PGY-2

## 2018-05-02 NOTE — Patient Instructions (Addendum)
Thank you for coming to see me today. It was a pleasure! Today we talked about:   For your back pain:   1. Gabapentin 300mg  capsules for your back pain. Take one capsule in the afternoon and one capsule in the evening. If your pain is not responding within 3 days, then take 2 capsules (600mg  total) in the afternoon and 2 capsules in the evening.   2. Prednisone 40mg  for 5 days. Take 1 pill each day for 5 days. This medication will hopefully help with any inflammation in your back.   Please follow-up with me in 2 weeks or sooner as needed.  If you have any questions or concerns, please do not hesitate to call the office at [phone].  Take Care,   [place] Arin Vanosdol, DO  Sciatica Sciatica is pain, numbness, weakness, or tingling along your sciatic nerve. The sciatic nerve starts in the lower back and goes down the back of each leg. Sciatica happens when this nerve is pinched or has pressure put on it. Sciatica usually goes away on its own or with treatment. Sometimes, sciatica may keep coming back (recur). Follow these instructions at home: Medicines  Take over-the-counter and prescription medicines only as told by your doctor.  Do not drive or use heavy machinery while taking prescription pain medicine. Managing pain  If directed, put ice on the affected area. ? Put ice in a plastic bag. ? Place a towel between your skin and the bag. ? Leave the ice on for 20 minutes, 2-3 times a day.  After icing, apply heat to the affected area before you exercise or as often as told by your doctor. Use the heat source that your doctor tells you to use, such as a moist heat pack or a heating pad. ? Place a towel between your skin and the heat source. ? Leave the heat on for 20-30 minutes. ? Remove the heat if your skin turns bright red. This is especially important if you are unable to feel pain, heat, or cold. You may have a greater risk of getting burned. Activity  Return to your normal  activities as told by your doctor. Ask your doctor what activities are safe for you. ? Avoid activities that make your sciatica worse.  Take short rests during the day. Rest in a lying or standing position. This is usually better than sitting to rest. ? When you rest for a long time, do some physical activity or stretching between periods of rest. ? Avoid sitting for a long time without moving. Get up and move around at least one time each hour.  Exercise and stretch regularly, as told by your doctor.  Do not lift anything that is heavier than 10 lb (4.5 kg) while you have symptoms of sciatica. ? Avoid lifting heavy things even when you do not have symptoms. ? Avoid lifting heavy things over and over.  When you lift objects, always lift in a way that is safe for your body. To do this, you should: ? Bend your knees. ? Keep the object close to your body. ? Avoid twisting. General instructions  Use good posture. ? Avoid leaning forward when you are sitting. ? Avoid hunching over when you are standing.  Stay at a healthy weight.  Wear comfortable shoes that support your feet. Avoid wearing high heels.  Avoid sleeping on a mattress that is too soft or too hard. You might have less pain if you sleep on a mattress that is firm  enough to support your back.  Keep all follow-up visits as told by your doctor. This is important. Contact a doctor if:  You have pain that: ? Wakes you up when you are sleeping. ? Gets worse when you lie down. ? Is worse than the pain you have had in the past. ? Lasts longer than 4 weeks.  You lose weight for without trying. Get help right away if:  You cannot control when you pee (urinate) or poop (have a bowel movement).  You have weakness in any of these areas and it gets worse. ? Lower back. ? Lower belly (pelvis). ? Butt (buttocks). ? Legs.  You have redness or swelling of your back.  You have a burning feeling when you pee. This information  is not intended to replace advice given to you by your health care provider. Make sure you discuss any questions you have with your health care provider. Document Released: 03/23/2008 Document Revised: 11/20/2015 Document Reviewed: 02/21/2015 Elsevier Interactive Patient Education  [employer].

## 2018-05-05 NOTE — Assessment & Plan Note (Addendum)
Chronic low back pain exacerbated after bending over and aligns more with sciatica flare. Patient has no red flag symptoms. Previously seen by pain clinic and received steroid injections. Will try previous therapies tried as patient is in much more pain than normal. -Will start with Gabapentin 300mg  twice daily, then may take 2 capsules (600mg  total) in the afternoon and 2 capsules in the evening. 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  -Will also trial Prednisone 40mg  for 5 days to see if this helps back pain, patient is diabetic and trying weight loss so will only do short course to see if this helps her pain. -Patient to f/u in 1-2 weeks and if sx not improved or worsened, patient may require repeat MRI, previous results above and patient now with inciting event.

## 2018-05-13 IMAGING — DX DG CHEST 2V
3 series · 3 of 3 positions shown · non-contrast
Comparison: 04/04/2011

CLINICAL DATA: Morbid obesity.  Pre operative respiratory exam.

EXAM:
CHEST  2 VIEW

[chest pa]
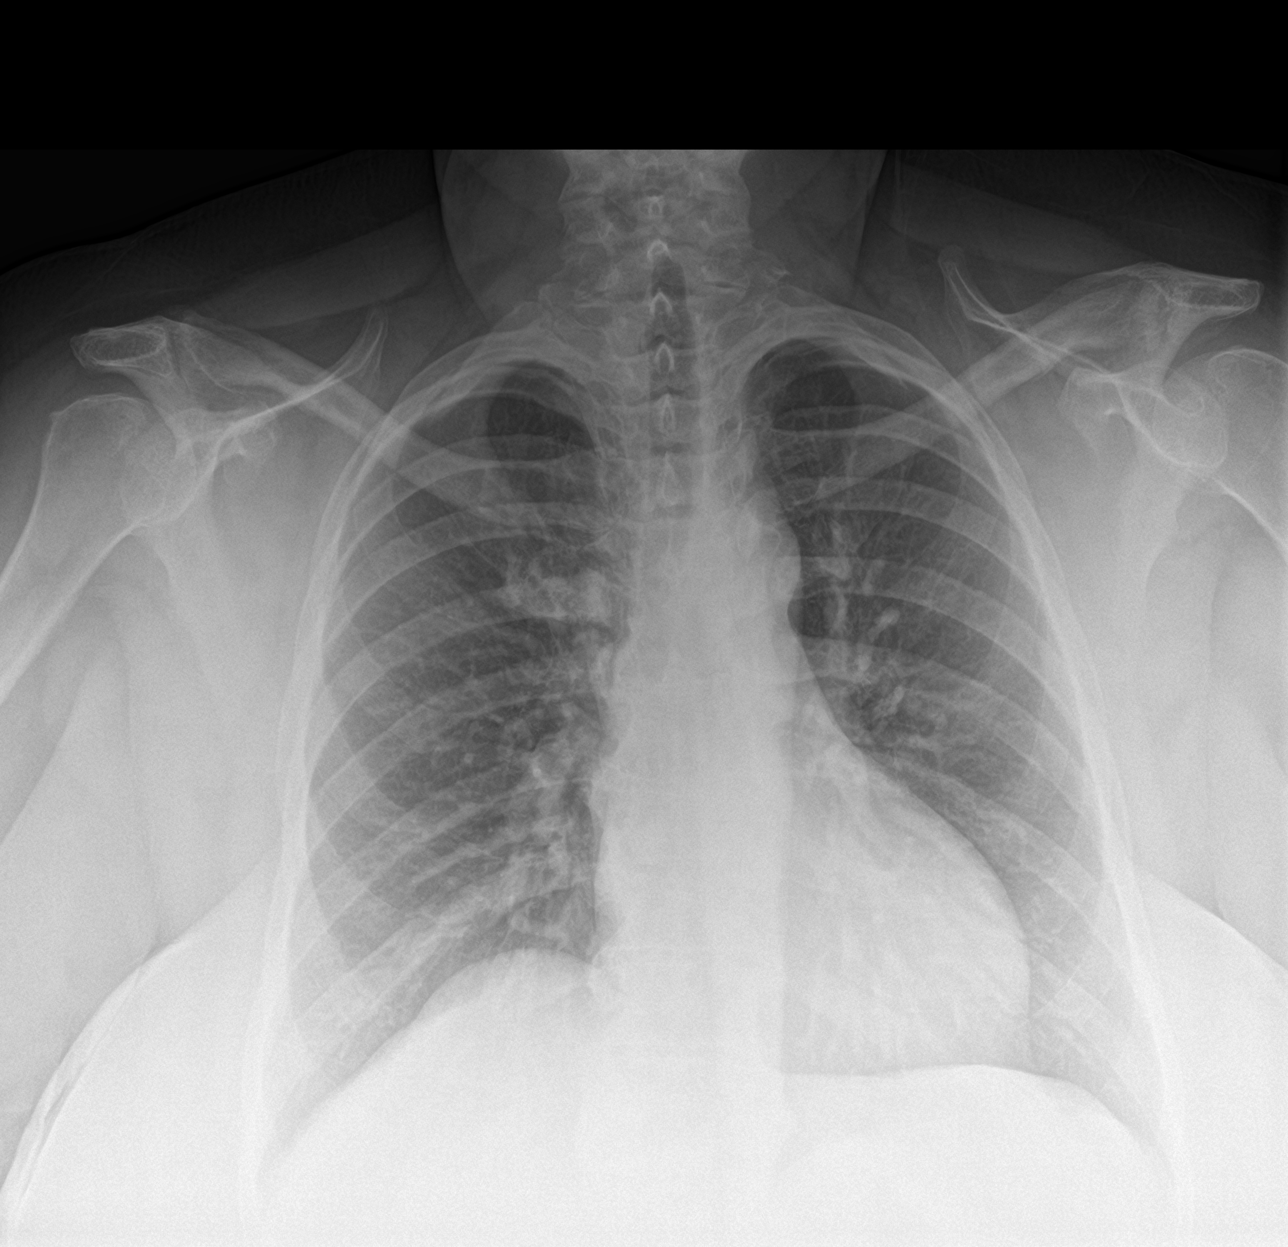

[chest lat (1 of 2)]
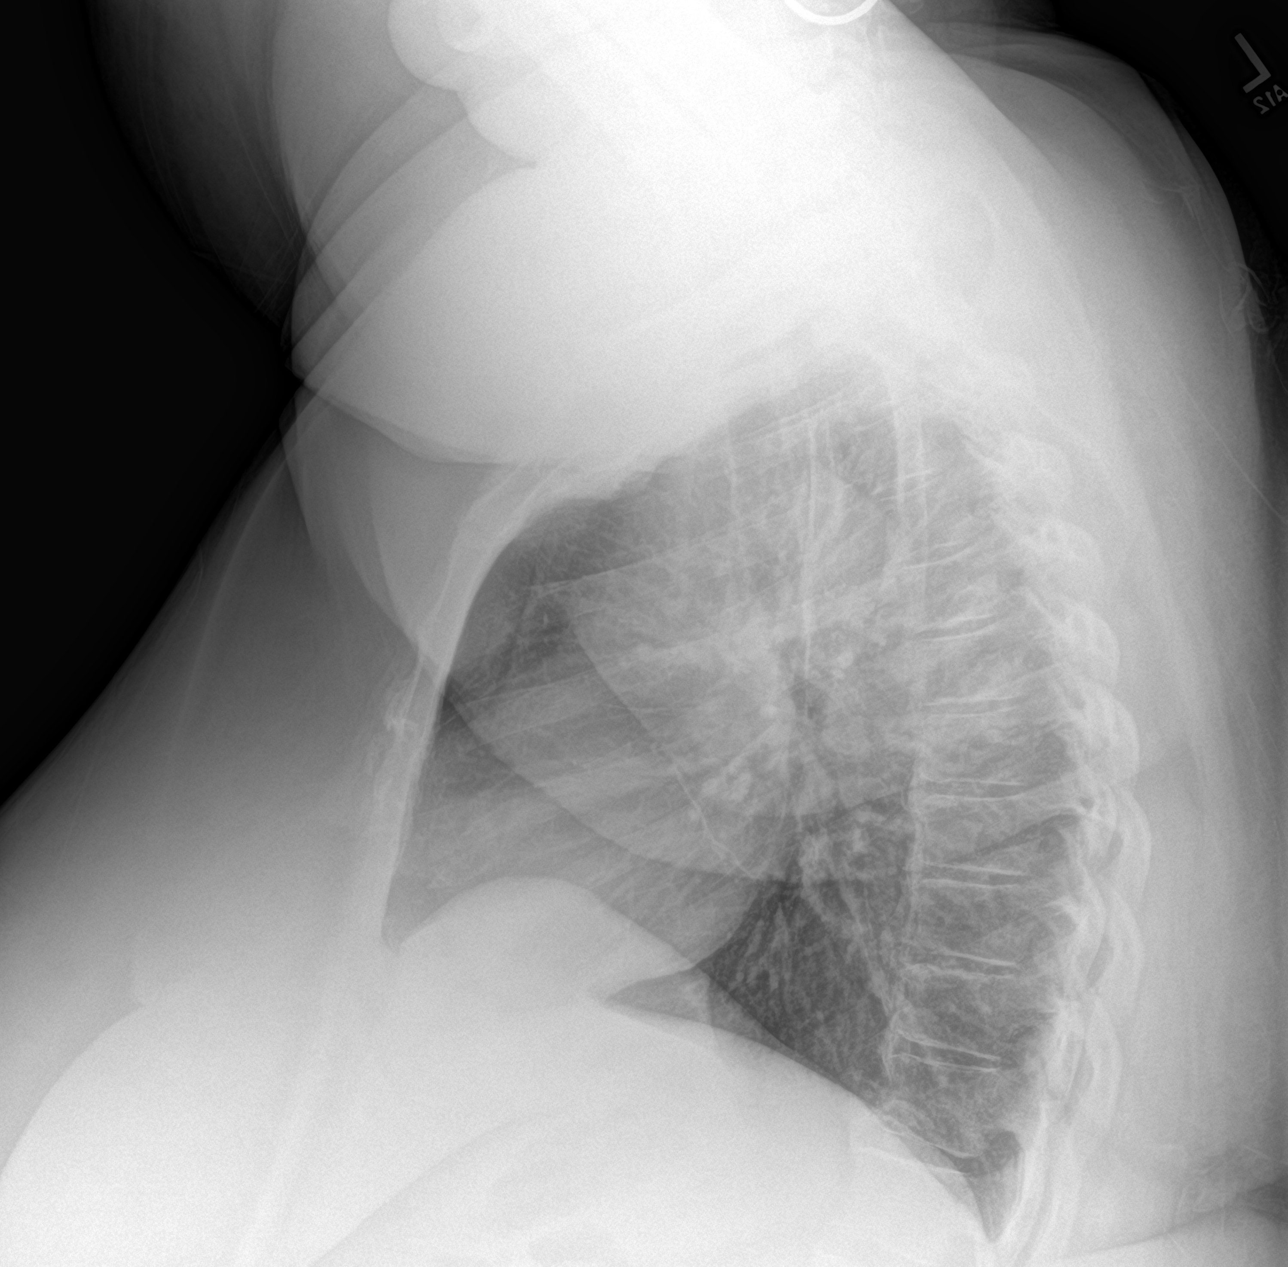

[chest lat (2 of 2)]
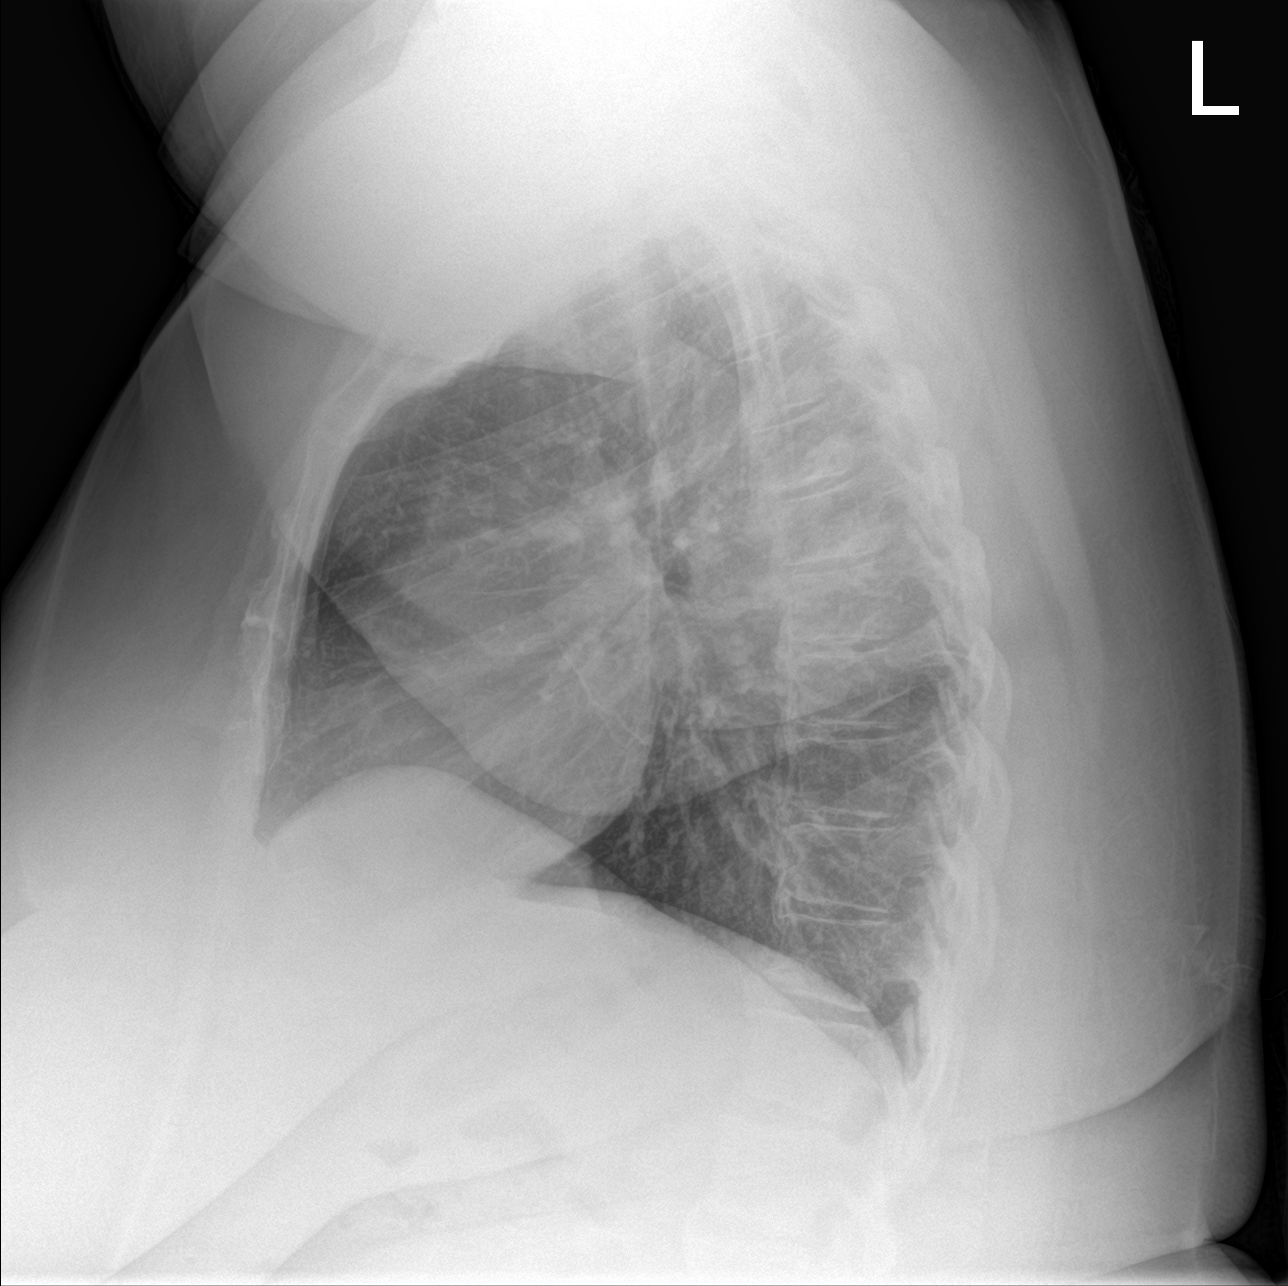

[3 of 3 positions shown; findings below may reference images not displayed]

FINDINGS: The heart size and mediastinal contours are within normal limits.
Both lungs are clear. The visualized skeletal structures are
unremarkable.
IMPRESSION: Normal exam.

## 2018-05-13 IMAGING — CR DG UGI W/ KUB
11 series · 11 of 11 positions shown · non-contrast
Comparison: None.

CLINICAL DATA: Morbid obesity preoperative exam for gastric sleeve.

EXAM:
UPPER GI SERIES WITH KUB
TECHNIQUE: After obtaining a scout radiograph a routine upper GI series was
performed using thin density barium.
FLUOROSCOPY TIME:  Fluoroscopy Time:  1.5 minutes
Radiation Exposure Index (if provided by the fluoroscopic device):
82.9 mGy

[t abdomen supine]
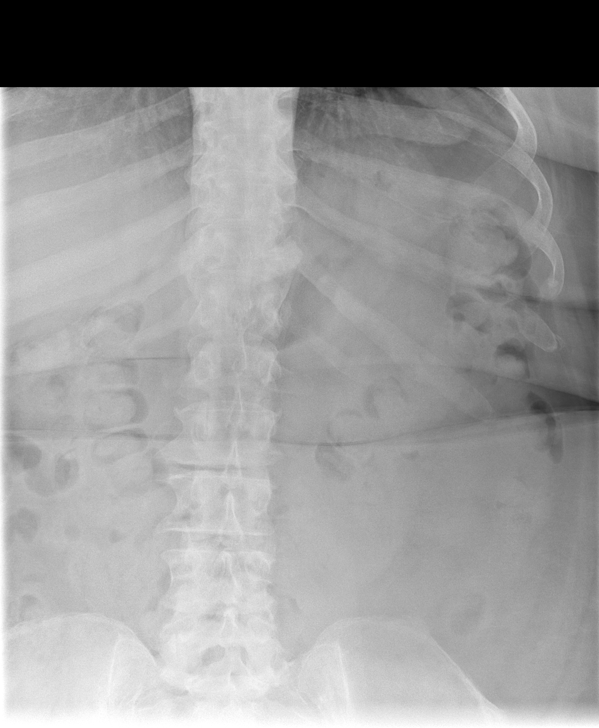

[fluoro_barium 2fps_bw (1 of 10)]
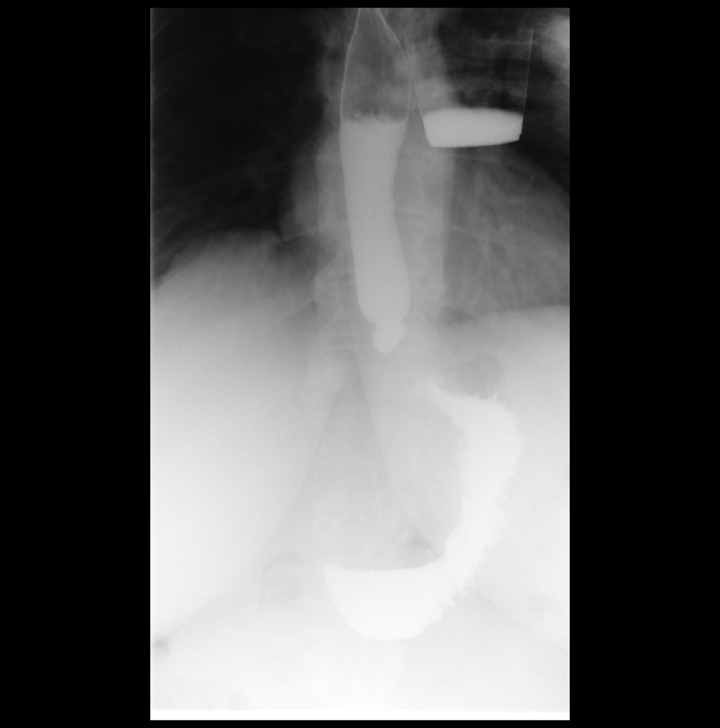

[fluoro_barium 2fps_bw (2 of 10)]
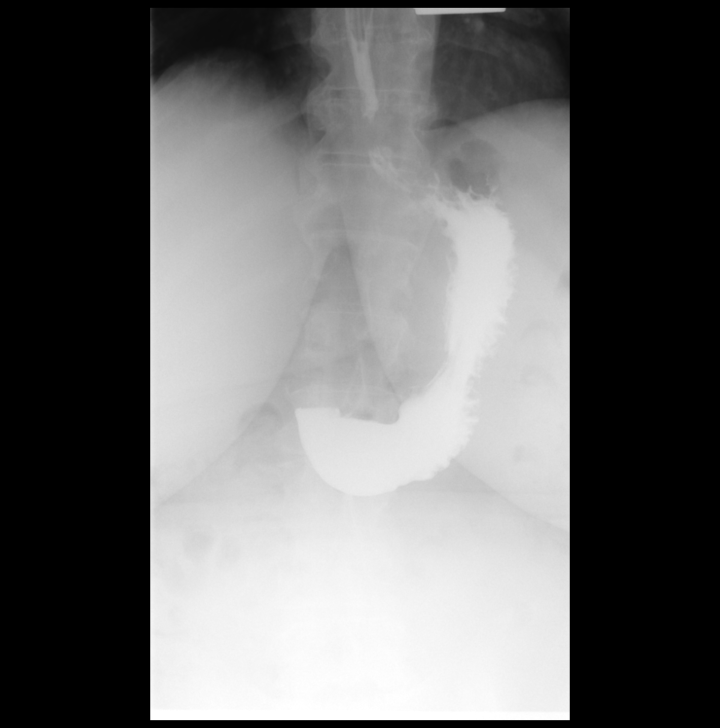

[fluoro_barium 2fps_bw (3 of 10)]
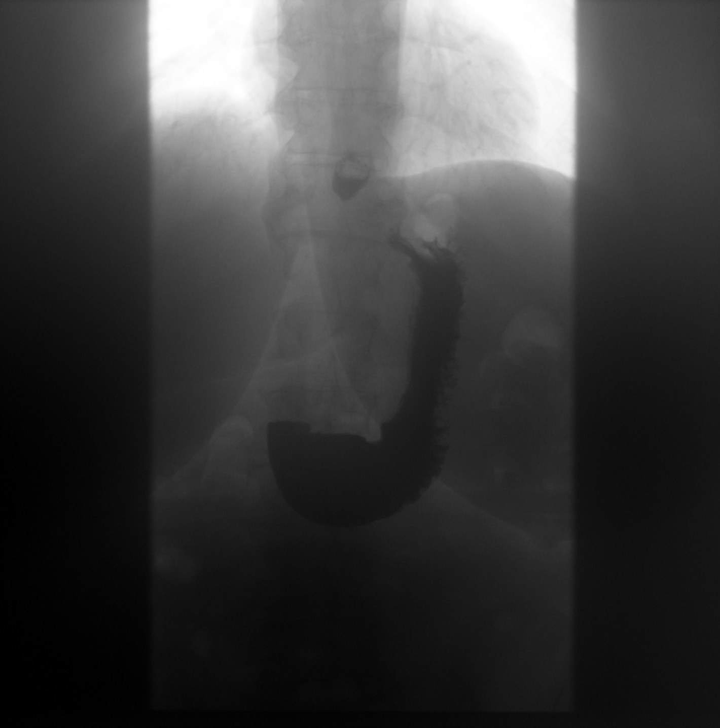

[fluoro_barium 2fps_bw (4 of 10)]
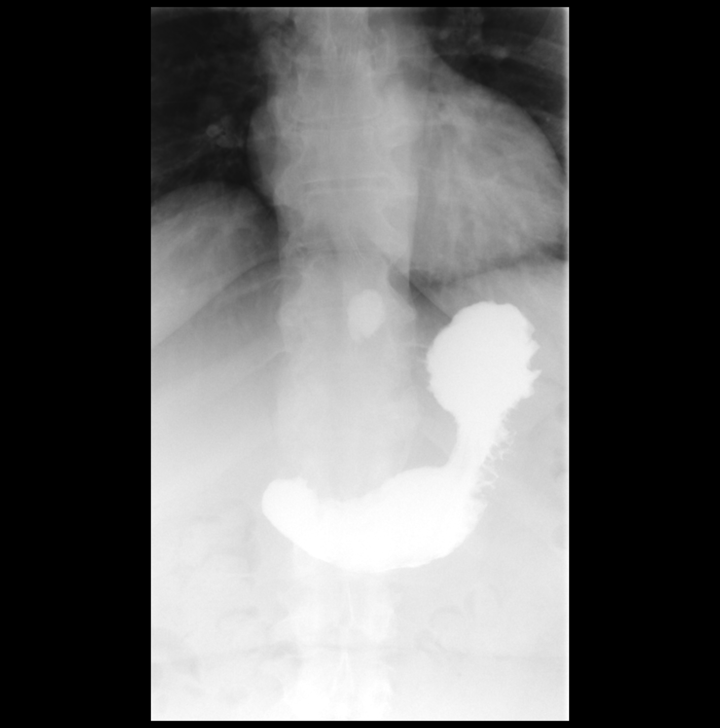

[fluoro_barium 2fps_bw (5 of 10)]
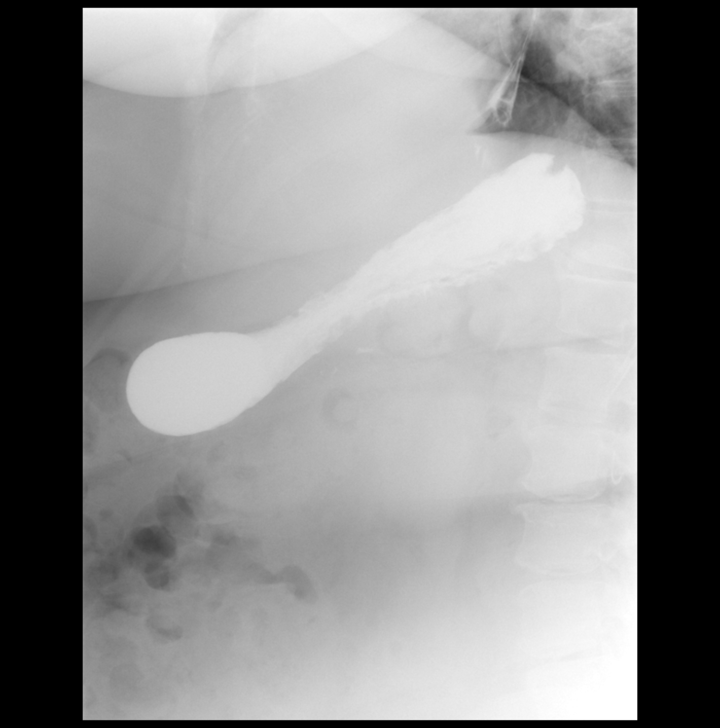

[fluoro_barium 2fps_bw (6 of 10)]
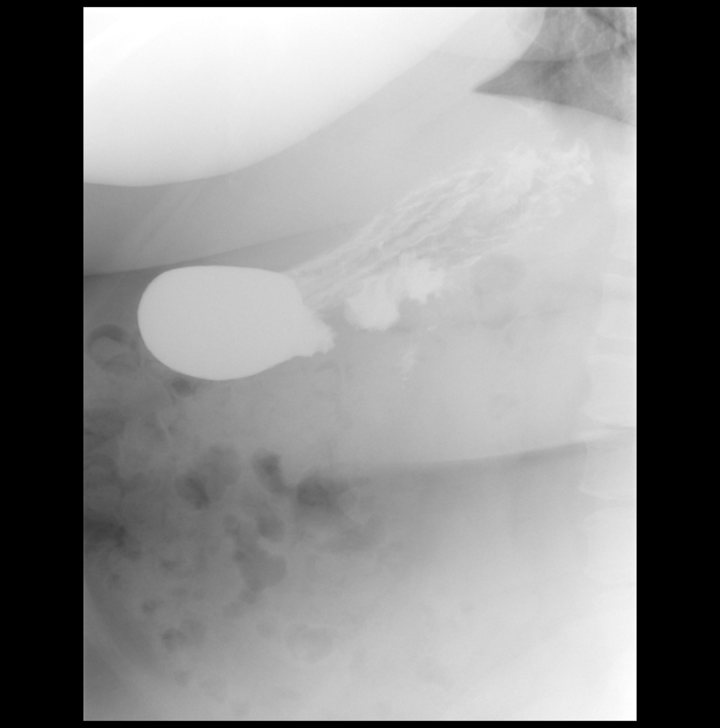

[fluoro_barium 2fps_bw (7 of 10)]
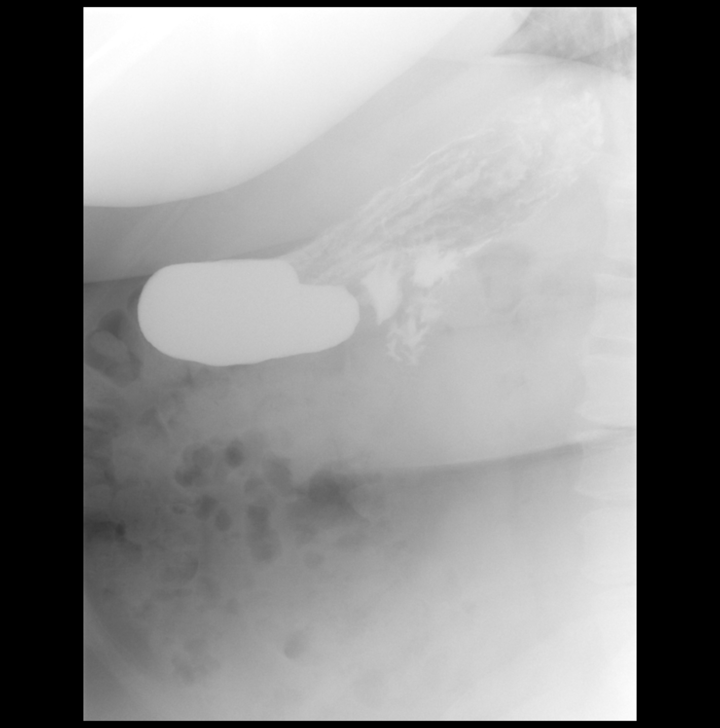

[fluoro_barium 2fps_bw (8 of 10)]
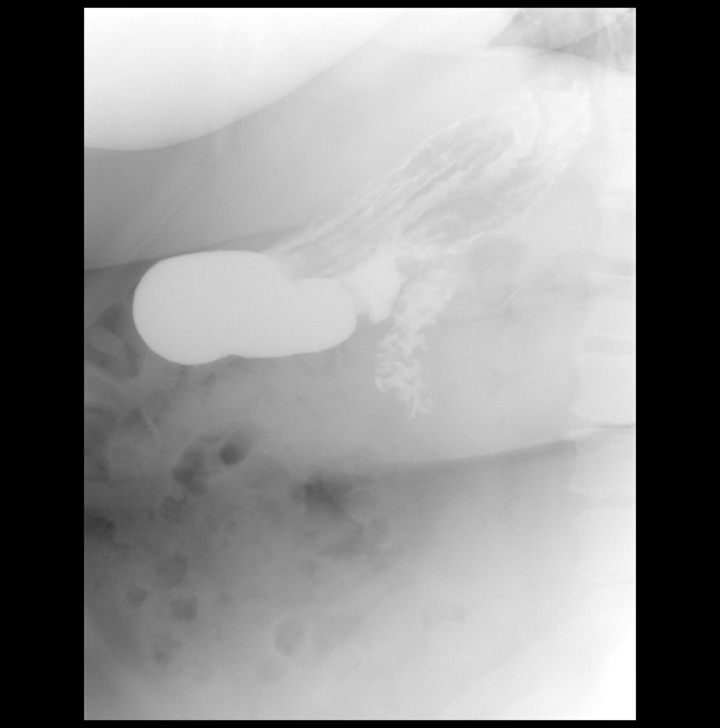

[fluoro_barium 2fps_bw (9 of 10)]
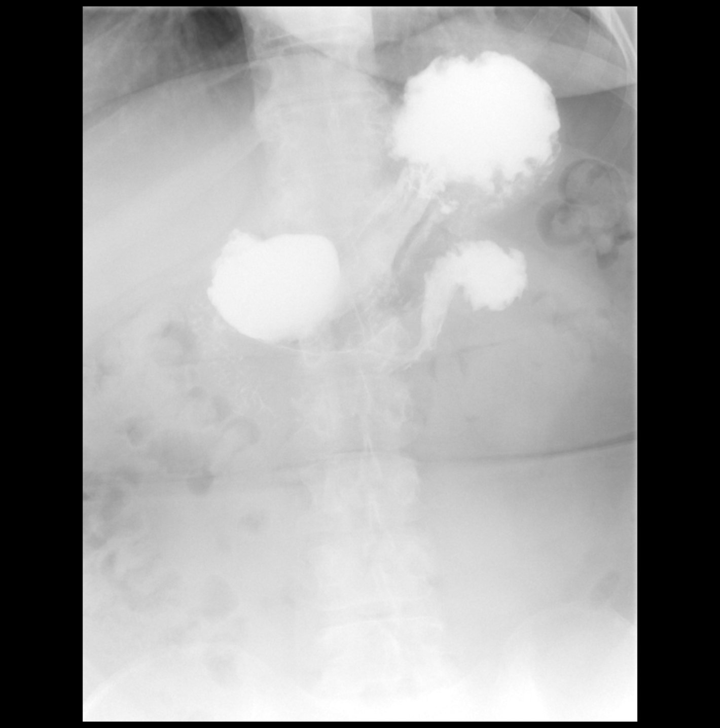

[fluoro_barium 2fps_bw (10 of 10)]
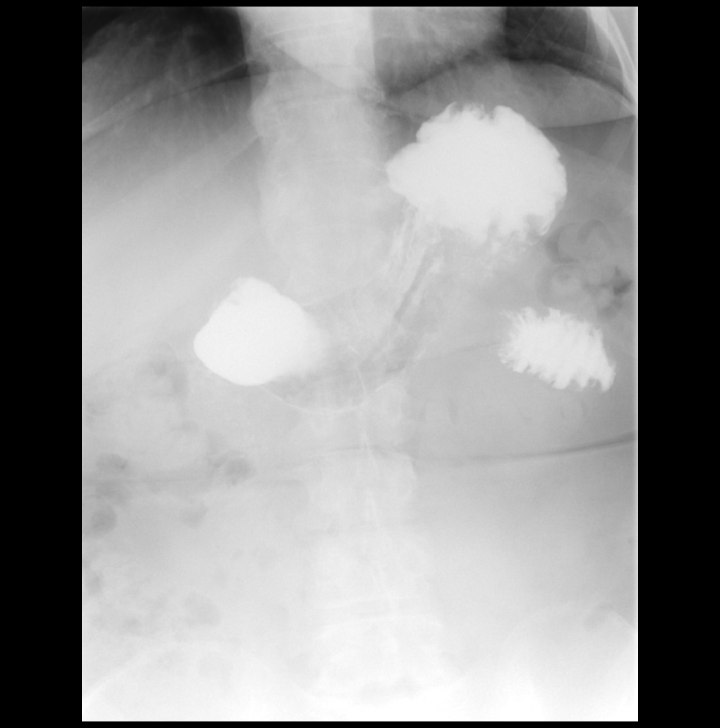

[11 of 11 positions shown; findings below may reference images not displayed]

FINDINGS: The KUB is normal.

The mucosa and motility of the esophagus are normal. No hiatal
hernia or gastroesophageal reflux.

The fundus, body, and antrum of the stomach are normal. The pylorus
and duodenal bulb and duodenal C-loop are normal.
IMPRESSION: Normal upper GI.

## 2018-05-15 ENCOUNTER — Encounter: Payer: Self-pay | Admitting: Family Medicine

## 2018-05-15 ENCOUNTER — Ambulatory Visit (INDEPENDENT_AMBULATORY_CARE_PROVIDER_SITE_OTHER): Payer: BLUE CROSS/BLUE SHIELD | Admitting: Family Medicine

## 2018-05-15 ENCOUNTER — Other Ambulatory Visit: Payer: Self-pay

## 2018-05-15 VITALS — BP 138/90 | HR 76 | Temp 98.2°F | Ht 65.0 in | Wt 291.0 lb

## 2018-05-15 DIAGNOSIS — M5431 Sciatica, right side: Secondary | ICD-10-CM

## 2018-05-15 DIAGNOSIS — Z1239 Encounter for other screening for malignant neoplasm of breast: Secondary | ICD-10-CM

## 2018-05-15 DIAGNOSIS — Z23 Encounter for immunization: Secondary | ICD-10-CM

## 2018-05-15 DIAGNOSIS — I10 Essential (primary) hypertension: Secondary | ICD-10-CM

## 2018-05-15 DIAGNOSIS — M5441 Lumbago with sciatica, right side: Secondary | ICD-10-CM

## 2018-05-15 NOTE — Assessment & Plan Note (Signed)
Chronic low back pain exacerbated by sciatica flare.  Patient with no red flag symptoms.  Will increase gabapentin to 900 mg 3 times a day given that patient is not experiencing any drowsiness.  We will also refer patient to physical therapy in order to help with her chronic pain. Given that pain has improved after starting gabapentin will hold off on repeat MRI at this time and patient given strict return precautions.

## 2018-05-15 NOTE — Assessment & Plan Note (Signed)
Patient's BP 138/90 in office today.  Patient reports that she has not taking any medications although was asked to start Norvasc 2.5 mg.  Patient believes that blood pressure elevated due to pain.  Would like to continue trying exercise and diet in order to control blood pressure.  We will discuss starting medication at next visit.

## 2018-05-15 NOTE — Progress Notes (Signed)
  Subjective:    Patient ID: Valerie Carter, female    DOB: [birth year], 57 y.o.   MRN: [id number]   CC: Follow-up sciatica  HPI:  Sciatica: Patient reports that she is tolerating gabapentin well.  Patient now up to taking 600 mg 3 times per day.  Patient reports that this has helped her back pain.  However she is still experiencing some breakthrough pain that radiates down her right leg.  Patient is concerned that with a steroid burst she has now gained weight after losing weight previously.  Patient reports being compliant with diet.  Patient plans to go to pool in the morning to try to work out more because she is very motivated to have knee replacement surgery done. ROS: Patient denies any bowel or bladder incontinence, denies any numbness or weakness  Smoking status reviewed  ROS: 10 point ROS is otherwise negative, except as mentioned in HPI  Patient Active Problem List   Diagnosis Date Noted  . Constipation 03/16/2018  . 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 Intramural leiomyoma of uterus 01/27/2016  . Mild depression ([place]) 09/25/2014  . Vitamin D deficiency 10/19/2013  . Seasonal allergies 09/07/2011  . 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 Diet-controlled diabetes mellitus ([place]) 05/20/2010  . Low back pain 01/29/2010  . Hyperlipidemia LDL goal <70 10/01/2008  . Obesity 10/24/2006  . HYPERTENSION, BENIGN ESSENTIAL 09/26/2006     Objective:  BP 138/90   Pulse 76   Temp 98.2 F (36.8 C) (Oral)   Ht 5\' 5"  (1.651 m)   Wt 291 lb (132 kg)   SpO2 99%   BMI 48.42 kg/m  Vitals and nursing note reviewed  General: NAD, pleasant, using cane Respiratory: normal effort Extremities: no edema or cyanosis. WWP. Skin: warm and dry, no rashes noted Neuro: alert and oriented, no focal deficits Psych: normal affect  Assessment & Plan:    HYPERTENSION, BENIGN ESSENTIAL Patient's BP 138/90 in office today.  Patient reports  that she has not taking any medications although was asked to start Norvasc 2.5 mg.  Patient believes that blood pressure elevated due to pain.  Would like to continue trying exercise and diet in order to control blood pressure.  We will discuss starting medication at next visit.  Low back pain Chronic low back pain exacerbated by sciatica flare.  Patient with no red flag symptoms.  Will increase gabapentin to 900 mg 3 times a day given that patient is not experiencing any drowsiness.  We will also refer patient to physical therapy in order to help with her chronic pain. Given that pain has improved after starting gabapentin will hold off on repeat MRI at this time and patient given strict return precautions.    [place] Ryane Konieczny, DO Family Medicine Resident PGY-2

## 2018-05-15 NOTE — Patient Instructions (Signed)
Thank you for coming to see me today. It was a pleasure! Today we talked about:   Your back pain. Please start taking gabapentin 900 mg (3 tabs) three times per day. I have put in a referral for physical therapy to help with weight.    Please follow-up with me in 1 month or sooner as needed.  If you have any questions or concerns, please do not hesitate to call the office at [phone].  Take Care,   [place] Tameria Patti, DO

## 2018-05-16 LAB — BASIC METABOLIC PANEL
BUN/Creatinine Ratio: 31 — ABNORMAL HIGH (ref 9–23)
BUN: 21 mg/dL (ref 6–24)
CALCIUM: 9.4 mg/dL (ref 8.7–10.2)
CHLORIDE: 102 mmol/L (ref 96–106)
CO2: 24 mmol/L (ref 20–29)
Creatinine, Ser: 0.67 mg/dL (ref 0.57–1.00)
GFR calc Af Amer: 113 mL/min/{1.73_m2} (ref >59)
GFR calc non Af Amer: 98 mL/min/{1.73_m2} (ref >59)
GLUCOSE: 110 mg/dL — AB (ref 65–99)
POTASSIUM: 4 mmol/L (ref 3.5–5.2)
Sodium: 139 mmol/L (ref 134–144)

## 2018-05-16 LAB — LIPID PANEL
CHOL/HDL RATIO: 2.8 ratio (ref 0.0–4.4)
Cholesterol, Total: 196 mg/dL (ref 100–199)
HDL: 69 mg/dL (ref >39)
LDL Calculated: 110 mg/dL — ABNORMAL HIGH (ref 0–99)
Triglycerides: 87 mg/dL (ref 0–149)
VLDL Cholesterol Cal: 17 mg/dL (ref 5–40)

## 2018-05-19 ENCOUNTER — Ambulatory Visit (INDEPENDENT_AMBULATORY_CARE_PROVIDER_SITE_OTHER): Payer: BLUE CROSS/BLUE SHIELD | Admitting: Family Medicine

## 2018-05-19 DIAGNOSIS — M170 Bilateral primary osteoarthritis of knee: Secondary | ICD-10-CM

## 2018-05-19 MED ORDER — METHYLPREDNISOLONE ACETATE 80 MG/ML IJ SUSP
80.0000 mg | Freq: Once | INTRAMUSCULAR | Status: AC
  Administered 2018-05-19: 80 mg via INTRAMUSCULAR

## 2018-05-19 MED ORDER — DICLOFENAC SODIUM 1 % TD GEL: 2.0000 g | Freq: Four times a day (QID) | TRANSDERMAL | 12 refills | Status: DC

## 2018-05-19 NOTE — Progress Notes (Signed)
  Valerie Carter - 57 y.o. female MRN [id number]  Date of birth: Oct 22, 1960    SUBJECTIVE:      Chief Complaint:/ HPI:  Worsening knee pain.  She is quite frustrated.  Wants to consider corticosteroid injection. Was placed on gabapentin for some additional pain relief and that she gained significant amount of weight in the 2 weeks that she was taking it.  So she wants to discontinue that.  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  ROS:     Lateral knee pain  PERTINENT  PMH / PSH FH / / SH:  Past Medical, Surgical, Social, and Family History Reviewed & Updated in the EMR.  Pertinent findings include:  Bilateral severe DJD knees   OBJECTIVE: BP (!) 151/95   Ht 5' 4.5" (1.638 m)   Wt 291 lb (132 kg)   BMI 49.18 kg/m   Physical Exam:  Vital signs are reviewed. GENERAL: Well-developed no acute distress KNEES: Synovial thickening and deformity secondary to chronic DJD.  Tenderness palpation medial and lateral joint line on the right, medial joint on the left.  Significant crepitus on extension of the right.  Mild crepitus in the left.  Popliteal space is benign.  Calf is soft. SKIN: Skin around the knee areas without any sign of lesions, no unusual erythema  PROCEDURE: INJECTION: Patient was given informed consent, signed copy in the chart. Appropriate time out was taken. Area prepped and draped in usual sterile fashion. Ethyl chloride was  used for local anesthesia. A 21 gauge 1 1/2 inch needle was used.. 1 cc of methylprednisolone 40 mg/ml plus  4 cc of 1% lidocaine without epinephrine was injected into the bilaterral knees using a(n)   anterior media approach.   The patient tolerated the procedure well. There were no complications. Post procedure  instructions were given.   ASSESSMENT & PLAN:  See problem based charting & AVS for pt instructions.

## 2018-05-19 NOTE — Assessment & Plan Note (Signed)
Bilateral corticosteroid injection follow-up 3 to 4 weeks

## 2018-05-19 NOTE — Assessment & Plan Note (Signed)
>>  ASSESSMENT AND PLAN FOR ARTHRITIS OF KNEE, DEGENERATIVE WRITTEN ON 05/19/2018  4:12 PM BY Quiera Diffee L, MD  Bilateral corticosteroid injection follow-up 3 to 4 weeks

## 2018-05-24 ENCOUNTER — Ambulatory Visit (INDEPENDENT_AMBULATORY_CARE_PROVIDER_SITE_OTHER): Payer: BLUE CROSS/BLUE SHIELD | Admitting: Family Medicine

## 2018-05-24 ENCOUNTER — Other Ambulatory Visit: Payer: Self-pay

## 2018-05-24 ENCOUNTER — Ambulatory Visit: Payer: BLUE CROSS/BLUE SHIELD | Attending: Family Medicine | Admitting: Physical Therapy

## 2018-05-24 ENCOUNTER — Encounter: Payer: Self-pay | Admitting: Physical Therapy

## 2018-05-24 VITALS — BP 128/81 | HR 99 | Temp 97.9°F | Wt 273.0 lb

## 2018-05-24 DIAGNOSIS — M5416 Radiculopathy, lumbar region: Secondary | ICD-10-CM | POA: Diagnosis not present

## 2018-05-24 DIAGNOSIS — R262 Difficulty in walking, not elsewhere classified: Secondary | ICD-10-CM | POA: Insufficient documentation

## 2018-05-24 DIAGNOSIS — M5431 Sciatica, right side: Secondary | ICD-10-CM | POA: Diagnosis not present

## 2018-05-24 DIAGNOSIS — R209 Unspecified disturbances of skin sensation: Secondary | ICD-10-CM

## 2018-05-24 DIAGNOSIS — M6281 Muscle weakness (generalized): Secondary | ICD-10-CM

## 2018-05-24 DIAGNOSIS — G8929 Other chronic pain: Secondary | ICD-10-CM | POA: Diagnosis not present

## 2018-05-24 DIAGNOSIS — M25562 Pain in left knee: Secondary | ICD-10-CM | POA: Insufficient documentation

## 2018-05-24 DIAGNOSIS — M25561 Pain in right knee: Secondary | ICD-10-CM | POA: Diagnosis not present

## 2018-05-24 MED ORDER — AMITRIPTYLINE HCL 10 MG PO TABS: 10.0000 mg | ORAL_TABLET | Freq: Every day | ORAL | 0 refills | Status: DC

## 2018-05-24 NOTE — Therapy (Signed)
[place], [place], [address] Phone: [phone]   Fax:  [phone]  Physical Therapy Evaluation  Patient Details  Name: Valerie Carter MRN: [id number] Date of Birth: 01/30/1961 Referring Provider (PT): Dr. Shirley Martinique   Encounter Date: 05/24/2018  PT End of Session - 05/24/18 1001    Visit Number  1    Number of Visits  16    Date for PT Re-Evaluation  07/19/18    PT Start Time  0800    PT Stop Time  9373    PT Time Calculation (min)  55 min    Activity Tolerance  Patient tolerated treatment well    Behavior During Therapy  [facility] for tasks assessed/performed       Past Medical History:  Diagnosis Date  . Back pain   . Carpal tunnel syndrome    while driving both hands  . Diabetes mellitus without complication ([place])    type 2  . DJD (degenerative joint disease)    both knees right worse than left  . Headache   . Hyperlipidemia   . Hypertension   . Intramural leiomyoma of uterus   . Knee pain   . Morbid obesity with BMI of 50.0-59.9, adult ([place])   . Vitamin D deficiency     Past Surgical History:  Procedure Laterality Date  . KNEE SURGERY Right 1999   torn cartlidge repair  . LAPAROSCOPIC GASTRIC SLEEVE RESECTION N/A 06/07/2017   Procedure: LAPAROSCOPIC GASTRIC SLEEVE RESECTION, UPPER ENDOSCOPY;  Surgeon: Kieth Brightly, Arta Bruce, MD;  Location: [facility];  Service: General;  Laterality: N/A;    There were no vitals filed for this visit.   Subjective Assessment - 05/24/18 0807    Subjective  Pt bent forward to sweep about 3 mos ago, stood up with severe pain, "pop" in her back.  Pain radiates into her Rt back, buttocks, to her ankle/foot.  Denies sensory changes.  Uses cane feels off balance.  Plans to have knee surgery when she loses weight.  She has difficulty with ADLs, home tasks, working, sleeping.  Pain wakes her each night.      Pertinent History  gastric sleeve for wgt loss Dec.  2018., Knee pain severe bilateral    Limitations  House hold activities;Sitting;Lifting;Standing;Walking    How long can you sit comfortably?  maybe 1 hour    How long can you stand comfortably?  not at all, mostly due to knees     How long can you walk comfortably?  not comfortable     Diagnostic tests  MRI in 2018 with spinal stenosis and Rt foraminal Stenosis , L3 disc     Patient Stated Goals  Pain relief, continue losing wgt    Currently in Pain?  Yes    Pain Score  6     Pain Location  Back    Pain Orientation  Right    Pain Descriptors / Indicators  Radiating;Aching   deep   Pain Type  Chronic pain    Pain Radiating Towards  buttock, knee and foot    Pain Onset  More than a month ago    Pain Frequency  Constant    Aggravating Factors   prolonged sitting, driving a bus, laying on R side     Pain Relieving Factors  Motrin, laying on L side    Effect of Pain on Daily Activities  unable to to work effectively, walk , hard to lose wgt.  Multiple Pain Sites  Yes    Pain Score  9    Pain Location  Knee    Pain Orientation  Right;Left    Pain Descriptors / Indicators  Discomfort;Aching    Pain Type  Chronic pain    Pain Onset  More than a month ago    Pain Frequency  Constant    Aggravating Factors   weightbearing    Pain Relieving Factors  sitting     Effect of Pain on Daily Activities  cant lose wgt         [facility] PT Assessment - 05/24/18 0001      Assessment   Medical Diagnosis  R sciatica     Referring Provider (PT)  Dr. Shirley Martinique    Onset Date/Surgical Date  --   02/2018 (late)   Next MD Visit  4 weeks     Prior Therapy  long ago       Precautions   Precautions  None      Restrictions   Weight Bearing Restrictions  No      Balance Screen   Has the patient fallen in the past 6 months  No    Has the patient had a decrease in activity level because of a fear of falling?   Yes    Is the patient reluctant to leave their home because of a fear of falling?    No      Home Environment   Living Environment  Private residence    Living Arrangements  Other relatives    Type of [place] Access  Stairs to enter    Entrance Stairs-Number of Steps  3    Entrance Stairs-Rails  Right    Home Layout  One level    [place] - quad      Prior Function   Level of Independence  Independent    Vocation  Full time employment    Vocation Requirements  bus driver 4 hour x 2     Leisure  Not much lately       Cognition   Overall Cognitive Status  Within Functional Limits for tasks assessed      Observation/Other Assessments   Focus on Therapeutic Outcomes (FOTO)   60%      Sensation   Light Touch  Appears Intact      Coordination   Gross Motor Movements are Fluid and Coordinated  Not tested      Posture/Postural Control   Posture/Postural Control  Postural limitations    Postural Limitations  Flexed trunk;Weight shift left    Posture Comments  genu valgus severe       AROM   Right Knee Extension  -25    Left Knee Extension  -18    Lumbar Flexion  WFL     Lumbar Extension  75% limited     Lumbar - Right Side Bend  25% no pain     Lumbar - Left Side Bend  25% no increased pain     Lumbar - Right Rotation  WFL    Lumbar - Left Rotation  WFL       PROM   Overall PROM Comments  bilateral hip decr IR      Strength   Right Hip Flexion  4/5    Right Hip ABduction  3-/5    Left Hip Flexion  4/5    Left Hip ABduction  3-/5  Right Knee Flexion  4/5   pain   Right Knee Extension  4/5   pain    Left Knee Flexion  4+/5    Left Knee Extension  4+/5    Right Ankle Dorsiflexion  4/5    Left Ankle Dorsiflexion  4/5      Palpation   Spinal mobility  not tolerated    Palpation comment  pain on Rt side of gluteals, paraspinals       Special Tests    Special Tests  Lumbar    Lumbar Tests  Straight Leg Raise      Straight Leg Raise   Findings  Negative    Side   Right    Comment  manual distraction of Rt LE decreased  pain       Bed Mobility   Bed Mobility  Rolling Right;Left Sidelying to Sit;Supine to Sit;Sit to Supine    Rolling Right  Independent with assistive device    Left Sidelying to Sit  Supervision/Verbal cueing    Supine to Sit  Supervision/Verbal cueing    Sit to Supine  Supervision/Verbal cueing      Transfers   Transfers  Sit to Stand;Supine to Sit    Sit to Stand  6: Modified independent (Device/Increase time)      Ambulation/Gait   Ambulation Distance (Feet)  150 Feet    Assistive device  Small based quad cane    Gait Pattern  Decreased step length - right;Decreased step length - left;Right flexed knee in stance;Left flexed knee in stance;Antalgic;Lateral hip instability                Objective measurements completed on examination: See above findings.      [facility] Adult PT Treatment/Exercise - 05/24/18 0001      Self-Care   Self-Care  Posture;Heat/Ice Application;Other Self-Care Comments    Other Self-Care Comments   HEP, see pt ed.       Lumbar Exercises: Prone   Other Prone Lumbar Exercises  POE x 3 min       Cryotherapy   Number Minutes Cryotherapy  15 Minutes    Cryotherapy Location  Lumbar Spine;Hip   R   Type of Cryotherapy  Ice pack      Electrical Stimulation   Electrical Stimulation Location  Rt low lumbar , glute     Electrical Stimulation Action  IFC    Electrical Stimulation Parameters  to tolerance    Electrical Stimulation Goals  Pain             PT Education - 05/24/18 1001    Education Details  PT/POC, HEP, anatomy, radicular pain , TENS/IFC     Person(s) Educated  Patient    Methods  Explanation;Demonstration;Tactile cues;Verbal cues;Handout    Comprehension  Verbalized understanding;Returned demonstration;Need further instruction       PT Short Term Goals - 05/24/18 1002      PT SHORT TERM GOAL #1   Title  Pt will be I with HEP for back positioning, pain relief, stabilization    Time  4    Period  Weeks    Status  New     Target Date  06/21/18      PT SHORT TERM GOAL #2   Title  Pt will notice decreased pain in Rt LE with normal ADLs, improved 25%    Time  4    Period  Weeks    Status  New    Target Date  06/21/18      PT SHORT TERM GOAL #3   Title  Pt will tolerate NuStep for 10 min with pain in knees, back no more than moderate to show improved exercise capacity.     Time  4    Period  Weeks    Status  New    Target Date  06/21/18      PT SHORT TERM GOAL #4   Title  Pt will be able to sit more comfortably for 1 hour for improved work tolerance.     Time  4    Period  Weeks    Status  New    Target Date  06/21/18        PT Long Term Goals - 05/24/18 1007      PT LONG TERM GOAL #1   Title  Pt will be I with concepts of posture, body mechanics and lifting to protect back from future flare ups.     Time  8    Period  Weeks    Status  New    Target Date  07/19/18      PT LONG TERM GOAL #2   Title  Pt will be able to demonstrate hip abduction strength to 4/5 to improve knee and back pain, gait.     Time  8    Period  Weeks    Status  New    Target Date  07/19/18      PT LONG TERM GOAL #3   Title  Pt will be able to complete household tasks and ADLs with 50% less pain and difficulty.     Time  8    Period  Weeks    Status  New    Target Date  07/19/18      PT LONG TERM GOAL #4   Title  Pt will be able to work 4 hours with no more than moderate pain in back , most days of the week.     Time  8    Period  Weeks    Status  New    Target Date  07/19/18      PT LONG TERM GOAL #5   Title  FOTO score will improve to 40% to demo improved functional mobility.     Time  8    Period  Weeks    Status  New    Target Date  07/19/18             Plan - 05/24/18 1017    Clinical Impression Statement  Patient presents for moderate complexity eval of low back pain radiating into Rt LE.  She was limited in what she could do in her evaluation due to knee pain.  She has decent strength in  her knees despite pain but hips are very weak and has poor core strength.  She did have relief in prone and manual traction to RT LE.  She is motivated to improve so that she can undergo bilateral TKA (needs to lose 40 lbs per MD).       History and Personal Factors relevant to plan of care:  has to work full time, knee OA severe, obesity    Clinical Presentation  Evolving    Clinical Presentation due to:  worsening LE symptoms     Clinical Decision Making  Moderate    Rehab Potential  Good    Clinical Impairments Affecting Rehab Potential  severe knee pain     PT  Frequency  2x / week    PT Duration  8 weeks    PT Treatment/Interventions  ADLs/Self Care Home Management;Cryotherapy;Ultrasound;Traction;Moist Heat;Electrical Stimulation;DME Instruction;Therapeutic exercise;Balance training;Patient/family education;Manual techniques;Passive range of motion;Taping;Neuromuscular re-education;Functional mobility training;Therapeutic activities;Gait training;Dry needling    PT Next Visit Plan  pain relief,develop HEP,  try prone, manual traction, extension?, consider Nustep     PT Home Exercise Plan  prone for pain relief    Consulted and Agree with Plan of Care  Patient       Patient will benefit from skilled therapeutic intervention in order to improve the following deficits and impairments:  Abnormal gait, Decreased balance, Decreased endurance, Decreased mobility, Decreased activity tolerance, Increased fascial restricitons, Impaired flexibility, Postural dysfunction, Pain, Obesity, Improper body mechanics, Hypomobility, Difficulty walking, Decreased range of motion, Decreased strength  Visit Diagnosis: Radiculopathy, lumbar region  Chronic pain of right knee  Chronic pain of left knee  Muscle weakness (generalized)  Unspecified disturbances of skin sensation  Difficulty in walking, not elsewhere classified     Problem List Patient Active Problem List   Diagnosis Date Noted  .  Constipation 03/16/2018  . 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 Intramural leiomyoma of uterus 01/27/2016  . Mild depression ([place]) 09/25/2014  . Vitamin D deficiency 10/19/2013  . Seasonal allergies 09/07/2011  . Arthritis of knee, degenerative 10/29/2010  . Diet-controlled diabetes mellitus ([place]) 05/20/2010  . Low back pain 01/29/2010  . Hyperlipidemia LDL goal <70 10/01/2008  . Obesity 10/24/2006  . HYPERTENSION, BENIGN ESSENTIAL 09/26/2006    Tram Wrenn 05/24/2018, 10:28 AM  [facility] [address] [place], [place], [address] Phone: [phone]   Fax:  [phone]  Name: KINSHASA THROCKMORTON MRN: [id number] Date of Birth: 1960-07-12   Raeford Razor, PT 05/24/18 10:29 AM Phone: [phone] Fax: [phone]

## 2018-05-24 NOTE — Progress Notes (Signed)
    Subjective:  Valerie Carter is a 57 y.o. female who presents to the [facility] today with a chief complaint of back pain.   HPI:  Patient states that she has long standing low back pain with R sided sciatica. She was taking gabapentin which took that edge off her pain but that it caused her to gain weight. She would like to try a different medication. She tried to get on lyrica at some point but was told that it was too expensive.  She denies any h/o mood disorders. She states she walks with a cane pretty much all the time. No new acute worsening of her back pain or any new weakness at this time. Her sciatica travels down her R leg to her ankle. No recent trauma or unusual physical activity  She works as a Oceanographer  ROS: Per HPI  Social Hx: She reports that she has never smoked. She has never used smokeless tobacco. She reports that she does not drink alcohol or use drugs.   Objective:  Physical Exam: BP 128/81   Pulse 99   Temp 97.9 F (36.6 C) (Oral)   Wt 273 lb (123.8 kg)   SpO2 99%   BMI 46.14 kg/m   Gen: NAD, resting comfortably Pulm: NWOB MSK: decreased ROM in R hip. Walks with a cane minimizing weight bearing on R LE. No foot drop Skin: warm, dry Neuro: alert and awake, moves all extremities Psych: Normal affect and thought content   Assessment/Plan:  Sciatica of right side Long stnading h/o chronic low back pain with R sided sciatica not in acute flare but still causing significant impairment with daily function. Patient has self discontinued gabapentin due to concerns of weight gain. She was able to function better while on it indicating that her nerve related pain is significant. Unfortunately due to her body habitus she would not be a candidate for steroids even during an acute flare.  Discussed trial of amitriptyline 10mg  at bedtime. Discussed side effect profile and patient voiced good understanding.    Bufford Lope, DO PGY-3, [facility] 05/24/2018 11:31 AM

## 2018-05-24 NOTE — Patient Instructions (Signed)
Try amitriptyline 10mg  at bedtime for your nerve pain instead of gabapentin

## 2018-05-24 NOTE — Assessment & Plan Note (Addendum)
Long stnading h/o chronic low back pain with R sided sciatica not in acute flare but still causing significant impairment with daily function. Patient has self discontinued gabapentin due to concerns of weight gain. She was able to function better while on it indicating that her nerve related pain is significant. Unfortunately due to her body habitus she would not be a candidate for steroids even during an acute flare.  Discussed trial of amitriptyline 10mg  at bedtime. Discussed side effect profile and patient voiced good understanding.

## 2018-05-24 NOTE — Patient Instructions (Signed)
Gave prone on elbows to reduce LE pain, does on the bed with pillows

## 2018-06-06 ENCOUNTER — Ambulatory Visit: Payer: BLUE CROSS/BLUE SHIELD | Attending: Family Medicine | Admitting: Physical Therapy

## 2018-06-06 ENCOUNTER — Encounter: Payer: Self-pay | Admitting: Physical Therapy

## 2018-06-06 DIAGNOSIS — M6281 Muscle weakness (generalized): Secondary | ICD-10-CM | POA: Diagnosis not present

## 2018-06-06 DIAGNOSIS — R209 Unspecified disturbances of skin sensation: Secondary | ICD-10-CM | POA: Insufficient documentation

## 2018-06-06 DIAGNOSIS — G8929 Other chronic pain: Secondary | ICD-10-CM | POA: Diagnosis not present

## 2018-06-06 DIAGNOSIS — R262 Difficulty in walking, not elsewhere classified: Secondary | ICD-10-CM | POA: Insufficient documentation

## 2018-06-06 DIAGNOSIS — M5416 Radiculopathy, lumbar region: Secondary | ICD-10-CM | POA: Diagnosis not present

## 2018-06-06 DIAGNOSIS — M25561 Pain in right knee: Secondary | ICD-10-CM | POA: Diagnosis not present

## 2018-06-06 DIAGNOSIS — M25562 Pain in left knee: Secondary | ICD-10-CM | POA: Diagnosis not present

## 2018-06-06 NOTE — Patient Instructions (Signed)
Step 1  Step 2  Supine Posterior Pelvic Tilt reps: 10  sets: 2  hold: 10  daily: 1  weekly: 7 Setup  Begin lying on your back with your knees bent and your hands resting on your hip bones. Movement  Slowly tilt your pelvis backward, monitoring the movement with your hands. Then return to the starting position and repeat. Tip  Make sure to focus the movement only on your pelvis, and keep the rest of your back still during the exercise. Step 1  Step 2  Supine Bridge reps: 10  sets: 2  hold: 5  daily: 1  weekly: 7 Setup  Begin lying on your back with your arms resting at your sides, your legs bent at the knees and your feet flat on the ground. Movement  Tighten your abdominals and slowly lift your hips off the floor into a bridge position, keeping your back straight. Tip  Make sure to keep your trunk stiff throughout the exercise and your arms flat on the floor. Step 1  Step 2  Supine Hamstring Stretch with Strap reps: 3  sets: 1  hold: 30  daily: 1  weekly: 7 Setup  Begin lying on your back with your legs straight, holding the end of a strap that is looped around one foot.  Movement  Use the strap to pull your leg up toward your body until you feel a gentle stretch in the back of your upper leg. Hold this position. Tip  Make sure to keep your other leg straight on the ground during the stretch. Step 1  Step 2  Clamshell reps: 10  sets: 2  hold: 5  daily: 1  weekly: 7 Setup  Begin lying on your side with your knees bent and your hips and shoulders stacked. Movement  Engage your abdominals and raise your top knee up toward the ceiling, then slowly return to the starting position and repeat.  Tip  Make sure to keep your core engaged and do not roll your hips forward or backward during the exercise.

## 2018-06-06 NOTE — Therapy (Signed)
[place], [place], [address] Phone: [phone]   Fax:  [phone]  Physical Therapy Treatment  Patient Details  Name: Valerie Carter MRN: [id number] Date of Birth: [birth year] Referring Provider (PT): Dr. Shirley Martinique   Encounter Date: 06/06/2018  PT End of Session - 06/06/18 1157    Visit Number  2    Number of Visits  16    Date for PT Re-Evaluation  07/19/18    PT Start Time  1100    PT Stop Time  7408    PT Time Calculation (min)  56 min       Past Medical History:  Diagnosis Date  . Back pain   . Carpal tunnel syndrome    while driving both hands  . Diabetes mellitus without complication ([place])    type 2  . DJD (degenerative joint disease)    both knees right worse than left  . Headache   . Hyperlipidemia   . Hypertension   . Intramural leiomyoma of uterus   . Knee pain   . Morbid obesity with BMI of 50.0-59.9, adult ([place])   . Vitamin D deficiency     Past Surgical History:  Procedure Laterality Date  . KNEE SURGERY Right 1999   torn cartlidge repair  . LAPAROSCOPIC GASTRIC SLEEVE RESECTION N/A 06/07/2017   Procedure: LAPAROSCOPIC GASTRIC SLEEVE RESECTION, UPPER ENDOSCOPY;  Surgeon: Kieth Brightly, Arta Bruce, MD;  Location: [facility];  Service: General;  Laterality: N/A;    There were no vitals filed for this visit.  Subjective Assessment - 06/06/18 1103    Subjective  Knees 6/10.  Back Rt side and Rt leg 4/10.      Currently in Pain?  Yes    Pain Score  4     Pain Location  Back    Pain Orientation  Right    Pain Descriptors / Indicators  Aching;Radiating    Pain Type  Chronic pain    Pain Onset  More than a month ago    Pain Frequency  Constant    Aggravating Factors   work, sitting     Pain Relieving Factors  Motrin     Pain Score  6    Pain Location  Knee    Pain Orientation  Right;Left    Pain Descriptors / Indicators  Aching    Pain Type  Chronic pain    Pain Onset   More than a month ago    Pain Frequency  Constant    Aggravating Factors   walking     Pain Relieving Factors  ice            [facility] Adult PT Treatment/Exercise - 06/06/18 0001      Lumbar Exercises: Stretches   Active Hamstring Stretch  Right;Left;5 reps    Active Hamstring Stretch Limitations  30 sec with long sheet, more dynamic with towel x 10 added ankle DF     Passive Hamstring Stretch  Right;Left;3 reps;30 seconds    Single Knee to Chest Stretch Limitations  no stretch , limited by knee pain     Lower Trunk Rotation  10 seconds    Lower Trunk Rotation Limitations  x 10     Pelvic Tilt  10 reps      Lumbar Exercises: Aerobic   Recumbent Bike  7 min LE only L3       Lumbar Exercises: Supine   Pelvic Tilt  5 reps  Bridge  10 reps    [employer] Abdominal Isometric  10 reps;5 seconds      Lumbar Exercises: Prone   Other Prone Lumbar Exercises  POE with press up x 5      Knee/Hip Exercises: Aerobic   Nustep  5 min LE only level 3       Knee/Hip Exercises: Sidelying   Hip ABduction  Strengthening;Both;1 set;10 reps    Hip ABduction Limitations  painful     Clams  x 20              PT Education - 06/06/18 1242    Education Details  HEP, asked about PT for TKR    Person(s) Educated  Patient    Methods  Explanation;Handout    Comprehension  Verbalized understanding;Returned demonstration       PT Short Term Goals - 06/06/18 1114      PT SHORT TERM GOAL #1   Title  Pt will be I with HEP for back positioning, pain relief, stabilization    Status  On-going      PT SHORT TERM GOAL #2   Title  Pt will notice decreased pain in Rt LE with normal ADLs, improved 25%    Status  On-going      PT SHORT TERM GOAL #3   Title  Pt will tolerate NuStep for 10 min with pain in knees, back no more than moderate to show improved exercise capacity.     Status  On-going      PT SHORT TERM GOAL #4   Title  Pt will be able to sit more comfortably for 1 hour for improved  work tolerance.     Status  On-going        PT Long Term Goals - 05/24/18 1007      PT LONG TERM GOAL #1   Title  Pt will be I with concepts of posture, body mechanics and lifting to protect back from future flare ups.     Time  8    Period  Weeks    Status  New    Target Date  07/19/18      PT LONG TERM GOAL #2   Title  Pt will be able to demonstrate hip abduction strength to 4/5 to improve knee and back pain, gait.     Time  8    Period  Weeks    Status  New    Target Date  07/19/18      PT LONG TERM GOAL #3   Title  Pt will be able to complete household tasks and ADLs with 50% less pain and difficulty.     Time  8    Period  Weeks    Status  New    Target Date  07/19/18      PT LONG TERM GOAL #4   Title  Pt will be able to work 4 hours with no more than moderate pain in back , most days of the week.     Time  8    Period  Weeks    Status  New    Target Date  07/19/18      PT LONG TERM GOAL #5   Title  FOTO score will improve to 40% to demo improved functional mobility.     Time  8    Period  Weeks    Status  New    Target Date  07/19/18  Plan - 06/06/18 1104    Clinical Impression Statement  Did well today.  Less radicular pain but does have Rt knee pain which is hard to differentiate.  She like to Nustep and was able to exercise without increasing back pain.     PT Treatment/Interventions  ADLs/Self Care Home Management;Cryotherapy;Ultrasound;Traction;Moist Heat;Electrical Stimulation;DME Instruction;Therapeutic exercise;Balance training;Patient/family education;Manual techniques;Passive range of motion;Taping;Neuromuscular re-education;Functional mobility training;Therapeutic activities;Gait training;Dry needling    PT Next Visit Plan  check today's HEP, NuStep, try anterior hip stretching and hip abuction in standing if possible.    PT Home Exercise Plan  prone for pain relief, LTR, hamstring, PPT and clam     Consulted and Agree with Plan of  Care  Patient       Patient will benefit from skilled therapeutic intervention in order to improve the following deficits and impairments:  Abnormal gait, Decreased balance, Decreased endurance, Decreased mobility, Decreased activity tolerance, Increased fascial restricitons, Impaired flexibility, Postural dysfunction, Pain, Obesity, Improper body mechanics, Hypomobility, Difficulty walking, Decreased range of motion, Decreased strength  Visit Diagnosis: Radiculopathy, lumbar region  Chronic pain of right knee  Chronic pain of left knee  Muscle weakness (generalized)  Unspecified disturbances of skin sensation  Difficulty in walking, not elsewhere classified     Problem List Patient Active Problem List   Diagnosis Date Noted  . Sciatica of right side 05/24/2018  . Constipation 03/16/2018  . 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 Intramural leiomyoma of uterus 01/27/2016  . Mild depression ([place]) 09/25/2014  . Vitamin D deficiency 10/19/2013  . Seasonal allergies 09/07/2011  . Arthritis of knee, degenerative 10/29/2010  . Diet-controlled diabetes mellitus ([place]) 05/20/2010  . Low back pain 01/29/2010  . Hyperlipidemia LDL goal <70 10/01/2008  . Obesity 10/24/2006  . HYPERTENSION, BENIGN ESSENTIAL 09/26/2006    PAA,JENNIFER 06/06/2018, 12:46 PM  [facility] [address] [place], [place], [address] Phone: [phone]   Fax:  [phone]  Name: ROWENE SUTO MRN: [id number] Date of Birth: Jun 17, 1961  Raeford Razor, PT 06/06/18 12:46 PM Phone: [phone] Fax: [phone]

## 2018-06-07 ENCOUNTER — Other Ambulatory Visit: Payer: Self-pay | Admitting: Family Medicine

## 2018-06-07 ENCOUNTER — Ambulatory Visit: Payer: BLUE CROSS/BLUE SHIELD | Admitting: Physical Therapy

## 2018-06-07 DIAGNOSIS — Z1231 Encounter for screening mammogram for malignant neoplasm of breast: Secondary | ICD-10-CM

## 2018-06-08 DIAGNOSIS — E669 Obesity, unspecified: Secondary | ICD-10-CM | POA: Diagnosis not present

## 2018-06-08 DIAGNOSIS — Z9884 Bariatric surgery status: Secondary | ICD-10-CM | POA: Diagnosis not present

## 2018-06-12 ENCOUNTER — Ambulatory Visit: Payer: BLUE CROSS/BLUE SHIELD | Admitting: Physical Therapy

## 2018-06-13 ENCOUNTER — Encounter: Payer: BLUE CROSS/BLUE SHIELD | Attending: General Surgery | Admitting: Skilled Nursing Facility1

## 2018-06-13 DIAGNOSIS — E119 Type 2 diabetes mellitus without complications: Secondary | ICD-10-CM

## 2018-06-13 DIAGNOSIS — Z713 Dietary counseling and surveillance: Secondary | ICD-10-CM | POA: Diagnosis not present

## 2018-06-14 ENCOUNTER — Encounter: Payer: Self-pay | Admitting: Physical Therapy

## 2018-06-14 ENCOUNTER — Ambulatory Visit: Payer: BLUE CROSS/BLUE SHIELD | Admitting: Physical Therapy

## 2018-06-14 DIAGNOSIS — R209 Unspecified disturbances of skin sensation: Secondary | ICD-10-CM | POA: Diagnosis not present

## 2018-06-14 DIAGNOSIS — M25561 Pain in right knee: Secondary | ICD-10-CM

## 2018-06-14 DIAGNOSIS — M5416 Radiculopathy, lumbar region: Secondary | ICD-10-CM | POA: Diagnosis not present

## 2018-06-14 DIAGNOSIS — M6281 Muscle weakness (generalized): Secondary | ICD-10-CM

## 2018-06-14 DIAGNOSIS — M25562 Pain in left knee: Secondary | ICD-10-CM

## 2018-06-14 DIAGNOSIS — G8929 Other chronic pain: Secondary | ICD-10-CM | POA: Diagnosis not present

## 2018-06-14 DIAGNOSIS — R262 Difficulty in walking, not elsewhere classified: Secondary | ICD-10-CM | POA: Diagnosis not present

## 2018-06-14 NOTE — Therapy (Signed)
[place], [place], [address] Phone: [phone]   Fax:  [phone]  Physical Therapy Treatment  Patient Details  Name: Valerie Carter MRN: [id number] Date of Birth: 1960-08-13 Referring Provider (PT): Dr. Shirley Martinique   Encounter Date: 06/14/2018  PT End of Session - 06/14/18 1027    Visit Number  3    Number of Visits  16    Date for PT Re-Evaluation  07/19/18    PT Start Time  1016    PT Stop Time  1055    PT Time Calculation (min)  39 min       Past Medical History:  Diagnosis Date  . Back pain   . Carpal tunnel syndrome    while driving both hands  . Diabetes mellitus without complication ([place])    type 2  . DJD (degenerative joint disease)    both knees right worse than left  . Headache   . Hyperlipidemia   . Hypertension   . Intramural leiomyoma of uterus   . Knee pain   . Morbid obesity with BMI of 50.0-59.9, adult ([place])   . Vitamin D deficiency     Past Surgical History:  Procedure Laterality Date  . KNEE SURGERY Right 1999   torn cartlidge repair  . LAPAROSCOPIC GASTRIC SLEEVE RESECTION N/A 06/07/2017   Procedure: LAPAROSCOPIC GASTRIC SLEEVE RESECTION, UPPER ENDOSCOPY;  Surgeon: Kieth Brightly, Arta Bruce, MD;  Location: [facility];  Service: General;  Laterality: N/A;    There were no vitals filed for this visit.  Subjective Assessment - 06/14/18 1042    Currently in Pain?  Yes    Pain Score  4     Pain Location  Back    Pain Orientation  Right    Pain Descriptors / Indicators  Sharp;Throbbing    Pain Radiating Towards  right buttock     Pain Score  4    Pain Location  Knee    Pain Orientation  Right    Pain Descriptors / Indicators  Throbbing    Aggravating Factors   walking    Pain Relieving Factors  ice                       [facility] Adult PT Treatment/Exercise - 06/14/18 0001      Lumbar Exercises: Stretches   Active Hamstring Stretch  Right;Left;3  reps    Active Hamstring Stretch Limitations  seated    Lower Trunk Rotation  10 seconds    Lower Trunk Rotation Limitations  x 10     Hip Flexor Stretch Limitations  standing 2 x 20 sec each     Pelvic Tilt  10 reps      Lumbar Exercises: Standing   Other Standing Lumbar Exercises  hip abduction 10x 2       Lumbar Exercises: Supine   Pelvic Tilt  10 reps    Clam  20 reps    Clam Limitations  red band unilateral and bilateral     Bridge  10 reps    Large Ball Abdominal Isometric  10 reps;5 seconds      Knee/Hip Exercises: Aerobic   Nustep  8 min LE only level 3       Knee/Hip Exercises: Sidelying   Clams  x 20     Other Sidelying Knee/Hip Exercises  reverse clam x 20  PT Short Term Goals - 06/06/18 1114      PT SHORT TERM GOAL #1   Title  Pt will be I with HEP for back positioning, pain relief, stabilization    Status  On-going      PT SHORT TERM GOAL #2   Title  Pt will notice decreased pain in Rt LE with normal ADLs, improved 25%    Status  On-going      PT SHORT TERM GOAL #3   Title  Pt will tolerate NuStep for 10 min with pain in knees, back no more than moderate to show improved exercise capacity.     Status  On-going      PT SHORT TERM GOAL #4   Title  Pt will be able to sit more comfortably for 1 hour for improved work tolerance.     Status  On-going        PT Long Term Goals - 05/24/18 1007      PT LONG TERM GOAL #1   Title  Pt will be I with concepts of posture, body mechanics and lifting to protect back from future flare ups.     Time  8    Period  Weeks    Status  New    Target Date  07/19/18      PT LONG TERM GOAL #2   Title  Pt will be able to demonstrate hip abduction strength to 4/5 to improve knee and back pain, gait.     Time  8    Period  Weeks    Status  New    Target Date  07/19/18      PT LONG TERM GOAL #3   Title  Pt will be able to complete household tasks and ADLs with 50% less pain and difficulty.     Time   8    Period  Weeks    Status  New    Target Date  07/19/18      PT LONG TERM GOAL #4   Title  Pt will be able to work 4 hours with no more than moderate pain in back , most days of the week.     Time  8    Period  Weeks    Status  New    Target Date  07/19/18      PT LONG TERM GOAL #5   Title  FOTO score will improve to 40% to demo improved functional mobility.     Time  8    Period  Weeks    Status  New    Target Date  07/19/18            Plan - 06/14/18 1044    Clinical Impression Statement  Increased time on Nustep tolerated well.  Added standing hip flexor stretch and hip abduction witthout c/o pain. Reviewed latest HEP.     PT Next Visit Plan  check today's HEP, NuStep, try anterior hip stretching and hip abuction in standing if possible.    PT Home Exercise Plan  prone for pain relief, LTR, hamstring, PPT and clam     Consulted and Agree with Plan of Care  Patient       Patient will benefit from skilled therapeutic intervention in order to improve the following deficits and impairments:  Abnormal gait, Decreased balance, Decreased endurance, Decreased mobility, Decreased activity tolerance, Increased fascial restricitons, Impaired flexibility, Postural dysfunction, Pain, Obesity, Improper body mechanics, Hypomobility, Difficulty walking, Decreased range of  motion, Decreased strength  Visit Diagnosis: Radiculopathy, lumbar region  Chronic pain of right knee  Chronic pain of left knee  Muscle weakness (generalized)     Problem List Patient Active Problem List   Diagnosis Date Noted  . Sciatica of right side 05/24/2018  . Constipation 03/16/2018  . 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 Intramural leiomyoma of uterus 01/27/2016  . Mild depression ([place]) 09/25/2014  . Vitamin D deficiency 10/19/2013  . Seasonal allergies 09/07/2011  . Arthritis of knee, degenerative 10/29/2010  . Diet-controlled diabetes mellitus ([place])  05/20/2010  . Low back pain 01/29/2010  . Hyperlipidemia LDL goal <70 10/01/2008  . Obesity 10/24/2006  . HYPERTENSION, BENIGN ESSENTIAL 09/26/2006    Dorene Ar, PTA 06/14/2018, 10:53 AM  [facility] [address] [place], [place], [address] Phone: [phone]   Fax:  [phone]  Name: Valerie Carter MRN: [id number] Date of Birth: Aug 09, 1960

## 2018-06-16 ENCOUNTER — Encounter: Payer: Self-pay | Admitting: Skilled Nursing Facility1

## 2018-06-16 NOTE — Progress Notes (Signed)
Bariatric Class:  Appt start time: 6:00 end time: 7:00  12 Month Post-Operative Nutrition Class  Patient was seen on 06/13/2018 for Post-Operative Nutrition education at [facility].   Surgery date: 06/07/2017 Surgery type: Sleeve Gastrectomy  Start weight at [facility]: 360.8 Weight today: declined  TANITA  BODY COMP RESULTS  Declined   BMI (kg/m^2)    Fat Mass (lbs)    Fat Free Mass (lbs)    Total Body Water (lbs)    The following the learning objectives were met by the patient during this course:  Review of TANITA scale information  Share and discuss bariatric surgery successes and non-scale victories  Identifies Phase VII (Maintenance Phase) Dietary Goals which will be lifelong  Identifies appropriate sources of fluids, proteins, non-starchy vegetables, and complex carbohydrates  Identifies well-balanced meals  Identifies portion control   Identifies appropriate multivitamin and calcium sources post-operatively  Describes the need for physical activity post-operatively and will follow MD recommendations  Identifies and describes SMART goals   Creates at least 2 SMART goals to begin immediately  States when to call healthcare provider regarding medication questions or post-operative complications  Handouts given during class include:  Phase VII: Maintenance Phase-Lifelong  Pt set Goals: -I will chew until 30 times every time I eat by March 31st, 2020 -I will have a food choice from protein, veggies 2 times a day by January 1st, 2020 -I will water aerobics for 45 minutes 3 days a week by march 31st, 2020  Follow-Up Plan: Patient will follow-up at [facility] for on-going post-op nutrition visits.

## 2018-06-19 ENCOUNTER — Encounter: Payer: Self-pay | Admitting: Physical Therapy

## 2018-06-19 ENCOUNTER — Ambulatory Visit: Payer: BLUE CROSS/BLUE SHIELD | Admitting: Physical Therapy

## 2018-06-19 DIAGNOSIS — G8929 Other chronic pain: Secondary | ICD-10-CM

## 2018-06-19 DIAGNOSIS — R209 Unspecified disturbances of skin sensation: Secondary | ICD-10-CM

## 2018-06-19 DIAGNOSIS — M6281 Muscle weakness (generalized): Secondary | ICD-10-CM

## 2018-06-19 DIAGNOSIS — R262 Difficulty in walking, not elsewhere classified: Secondary | ICD-10-CM | POA: Diagnosis not present

## 2018-06-19 DIAGNOSIS — M5416 Radiculopathy, lumbar region: Secondary | ICD-10-CM | POA: Diagnosis not present

## 2018-06-19 DIAGNOSIS — M25561 Pain in right knee: Secondary | ICD-10-CM

## 2018-06-19 DIAGNOSIS — M25562 Pain in left knee: Secondary | ICD-10-CM | POA: Diagnosis not present

## 2018-06-19 NOTE — Therapy (Signed)
[place], [place], [address] Phone: [phone]   Fax:  [phone]  Physical Therapy Treatment  Patient Details  Name: Valerie Carter MRN: [id number] Date of Birth: 31-May-1961 Referring Provider (PT): Dr. Shirley Martinique   Encounter Date: 06/19/2018  PT End of Session - 06/19/18 0935    Visit Number  4    Number of Visits  16    Date for PT Re-Evaluation  07/19/18    PT Start Time  0930    PT Stop Time  1015    PT Time Calculation (min)  45 min       Past Medical History:  Diagnosis Date  . Back pain   . Carpal tunnel syndrome    while driving both hands  . Diabetes mellitus without complication ([place])    type 2  . DJD (degenerative joint disease)    both knees right worse than left  . Headache   . Hyperlipidemia   . Hypertension   . Intramural leiomyoma of uterus   . Knee pain   . Morbid obesity with BMI of 50.0-59.9, adult ([place])   . Vitamin D deficiency     Past Surgical History:  Procedure Laterality Date  . KNEE SURGERY Right 1999   torn cartlidge repair  . LAPAROSCOPIC GASTRIC SLEEVE RESECTION N/A 06/07/2017   Procedure: LAPAROSCOPIC GASTRIC SLEEVE RESECTION, UPPER ENDOSCOPY;  Surgeon: Kieth Brightly, Arta Bruce, MD;  Location: [facility];  Service: General;  Laterality: N/A;    There were no vitals filed for this visit.  Subjective Assessment - 06/19/18 1010    Currently in Pain?  Yes    Pain Score  0-No pain    Pain Score  2    Pain Location  Knee    Pain Orientation  Right                       [facility] Adult PT Treatment/Exercise - 06/19/18 0001      Lumbar Exercises: Stretches   Active Hamstring Stretch  Right;Left;3 reps    Active Hamstring Stretch Limitations  seated    Lower Trunk Rotation  10 seconds    Lower Trunk Rotation Limitations  x 10     Hip Flexor Stretch Limitations  standing 2 x 20 sec each     Piriformis Stretch  2 reps;20 seconds    Piriformis  Stretch Limitations  supine    Figure 4 Stretch  2 reps;20 seconds    Figure 4 Stretch Limitations  supine, attempted in sitting       Lumbar Exercises: Standing   Other Standing Lumbar Exercises  hip abduction 10x 2 , hip extension 10 x 2 , hip flexion 10 x 2       Lumbar Exercises: Supine   Pelvic Tilt  15 reps    Clam  20 reps    Clam Limitations  red band unilateral and bilateral     Bridge  15 reps    Straight Leg Raise  10 reps    Straight Leg Raises Limitations  with ab set     Large Ball Abdominal Isometric  5 seconds;15 reps      Knee/Hip Exercises: Aerobic   Nustep  10 min LE only level 3       Knee/Hip Exercises: Sidelying   Clams  x 20                PT Short Term Goals -  06/19/18 0939      PT SHORT TERM GOAL #1   Title  Pt will be I with HEP for back positioning, pain relief, stabilization    Time  4    Period  Weeks    Status  On-going      PT SHORT TERM GOAL #2   Title  Pt will notice decreased pain in Rt LE with normal ADLs, improved 25%    Baseline  no change     Time  4    Period  Weeks    Status  On-going      PT SHORT TERM GOAL #3   Title  Pt will tolerate NuStep for 10 min with pain in knees, back no more than moderate to show improved exercise capacity.     Time  4    Period  Weeks    Status  Achieved      PT SHORT TERM GOAL #4   Title  Pt will be able to sit more comfortably for 1 hour for improved work tolerance.     Baseline  can sit 1-1.5 hours     Time  4    Period  Weeks    Status  Partially Met        PT Long Term Goals - 05/24/18 1007      PT LONG TERM GOAL #1   Title  Pt will be I with concepts of posture, body mechanics and lifting to protect back from future flare ups.     Time  8    Period  Weeks    Status  New    Target Date  07/19/18      PT LONG TERM GOAL #2   Title  Pt will be able to demonstrate hip abduction strength to 4/5 to improve knee and back pain, gait.     Time  8    Period  Weeks    Status   New    Target Date  07/19/18      PT LONG TERM GOAL #3   Title  Pt will be able to complete household tasks and ADLs with 50% less pain and difficulty.     Time  8    Period  Weeks    Status  New    Target Date  07/19/18      PT LONG TERM GOAL #4   Title  Pt will be able to work 4 hours with no more than moderate pain in back , most days of the week.     Time  8    Period  Weeks    Status  New    Target Date  07/19/18      PT LONG TERM GOAL #5   Title  FOTO score will improve to 40% to demo improved functional mobility.     Time  8    Period  Weeks    Status  New    Target Date  07/19/18            Plan - 06/19/18 5465    Clinical Impression Statement  Able to tolerate 10 minutes on Nustep. Sleeping better but with meds. Sitting 1-1.5 hours before increased pain. RLE radicular pain about the same. Today is only her 3rd treatment. STG# 3 met. Added hip stretches. Attempted hip stretching in seated however unable to get ankle to knee. Pt given tennis ball for self massage to Right glut/piriformis.     PT Next Visit  Plan  check today's HEP, NuStep, try anterior hip stretching and hip abuction in standing if possible.    PT Home Exercise Plan  prone for pain relief, LTR, hamstring, PPT and clam     Consulted and Agree with Plan of Care  Patient       Patient will benefit from skilled therapeutic intervention in order to improve the following deficits and impairments:  Abnormal gait, Decreased balance, Decreased endurance, Decreased mobility, Decreased activity tolerance, Increased fascial restricitons, Impaired flexibility, Postural dysfunction, Pain, Obesity, Improper body mechanics, Hypomobility, Difficulty walking, Decreased range of motion, Decreased strength  Visit Diagnosis: Radiculopathy, lumbar region  Chronic pain of right knee  Chronic pain of left knee  Muscle weakness (generalized)  Unspecified disturbances of skin sensation  Difficulty in walking, not  elsewhere classified     Problem List Patient Active Problem List   Diagnosis Date Noted  . Sciatica of right side 05/24/2018  . Constipation 03/16/2018  . 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 Intramural leiomyoma of uterus 01/27/2016  . Mild depression ([place]) 09/25/2014  . Vitamin D deficiency 10/19/2013  . Seasonal allergies 09/07/2011  . Arthritis of knee, degenerative 10/29/2010  . Diet-controlled diabetes mellitus ([place]) 05/20/2010  . Low back pain 01/29/2010  . Hyperlipidemia LDL goal <70 10/01/2008  . Obesity 10/24/2006  . HYPERTENSION, BENIGN ESSENTIAL 09/26/2006    Dorene Ar, PTA 06/19/2018, 10:27 AM  [place], [place], [address] Phone: [phone]   Fax:  [phone]  Name: Valerie Carter MRN: [id number] Date of Birth: September 27, 1960

## 2018-06-20 DIAGNOSIS — M1712 Unilateral primary osteoarthritis, left knee: Secondary | ICD-10-CM | POA: Diagnosis not present

## 2018-06-20 DIAGNOSIS — M1711 Unilateral primary osteoarthritis, right knee: Secondary | ICD-10-CM | POA: Diagnosis not present

## 2018-06-22 ENCOUNTER — Encounter: Payer: Self-pay | Admitting: Physical Therapy

## 2018-06-22 ENCOUNTER — Ambulatory Visit: Payer: BLUE CROSS/BLUE SHIELD | Admitting: Physical Therapy

## 2018-06-22 DIAGNOSIS — G8929 Other chronic pain: Secondary | ICD-10-CM

## 2018-06-22 DIAGNOSIS — M6281 Muscle weakness (generalized): Secondary | ICD-10-CM

## 2018-06-22 DIAGNOSIS — M25561 Pain in right knee: Secondary | ICD-10-CM | POA: Diagnosis not present

## 2018-06-22 DIAGNOSIS — R262 Difficulty in walking, not elsewhere classified: Secondary | ICD-10-CM | POA: Diagnosis not present

## 2018-06-22 DIAGNOSIS — M25562 Pain in left knee: Secondary | ICD-10-CM | POA: Diagnosis not present

## 2018-06-22 DIAGNOSIS — M5416 Radiculopathy, lumbar region: Secondary | ICD-10-CM | POA: Diagnosis not present

## 2018-06-22 DIAGNOSIS — R209 Unspecified disturbances of skin sensation: Secondary | ICD-10-CM | POA: Diagnosis not present

## 2018-06-22 NOTE — Therapy (Signed)
[place], [place], [address] Phone: [phone]   Fax:  [phone]  Physical Therapy Treatment  Patient Details  Name: Valerie Carter MRN: [id number] Date of Birth: 1961/03/31 Referring Provider (PT): Dr. Shirley Martinique   Encounter Date: 06/22/2018  PT End of Session - 06/22/18 1325    Visit Number  5    Number of Visits  16    Date for PT Re-Evaluation  07/19/18    PT Start Time  1018    PT Stop Time  1115    PT Time Calculation (min)  57 min    Activity Tolerance  Patient tolerated treatment well    Behavior During Therapy  [facility] for tasks assessed/performed       Past Medical History:  Diagnosis Date  . Back pain   . Carpal tunnel syndrome    while driving both hands  . Diabetes mellitus without complication ([place])    type 2  . DJD (degenerative joint disease)    both knees right worse than left  . Headache   . Hyperlipidemia   . Hypertension   . Intramural leiomyoma of uterus   . Knee pain   . Morbid obesity with BMI of 50.0-59.9, adult ([place])   . Vitamin D deficiency     Past Surgical History:  Procedure Laterality Date  . KNEE SURGERY Right 1999   torn cartlidge repair  . LAPAROSCOPIC GASTRIC SLEEVE RESECTION N/A 06/07/2017   Procedure: LAPAROSCOPIC GASTRIC SLEEVE RESECTION, UPPER ENDOSCOPY;  Surgeon: Kieth Brightly, Arta Bruce, MD;  Location: [facility];  Service: General;  Laterality: N/A;    There were no vitals filed for this visit.  Subjective Assessment - 06/22/18 1024    Subjective  Knees 2/10. left,  right  4/10. Back Rt side and Rt leg 4/10.      Currently in Pain?  Yes    Pain Score  5     Pain Location  Back    Pain Orientation  Right;Lower    Pain Radiating Towards  buttock    Pain Frequency  Constant    Aggravating Factors   standing sitting,  work     Pain Relieving Factors  motrin,  tylenol,  laying on her side    Pain Score  2    Pain Location  Knee    Pain  Orientation  Right;Left    Pain Descriptors / Indicators  Throbbing    Pain Type  Chronic pain    Pain Onset  More than a month ago    Pain Frequency  Constant    Aggravating Factors   walking, longer standing    Pain Relieving Factors  laying on her side.                        [facility] Adult PT Treatment/Exercise - 06/22/18 0001      Self-Care   Self-Care  ADL's    Other Self-Care Comments   review      Lumbar Exercises: Stretches   Standing Side Bend  5 reps    Standing Side Bend Limitations  sitting stretches right only    Piriformis Stretch  1 rep;30 seconds    Piriformis Stretch Limitations  with strumming in sidelyinh,  AA    Gastroc Stretch  Right;Left;1 rep;10 seconds    Gastroc Stretch Limitations  not tolerated on step so stopped      Lumbar Exercises: Standing   Other  Standing Lumbar Exercises  abdominal bracing 1 X 5 seconds  mod cues initially.     Other Standing Lumbar Exercises  hip abd, ext and flexion 10 x each with abdominal bracing,  challanging      Lumbar Exercises: Seated   Other Seated Lumbar Exercises  Nerve floss with sheet mod cues needed  10 X      Lumbar Exercises: Sidelying   Other Sidelying Lumbar Exercises  right hip lengthening  10 X AA initially with cues      Knee/Hip Exercises: Aerobic   Nustep  15 minutes for endurance flexibility   patient request,  while on  machine self care for ADL     Moist Heat Therapy   Number Minutes Moist Heat  15 Minutes    Moist Heat Location  Lumbar Spine   quadratus lumborum     Electrical Stimulation   Electrical Stimulation Location  Rt low lumbar , glute     Electrical Stimulation Action  IFC    Electrical Stimulation Parameters  14    Electrical Stimulation Goals  Pain      Manual Therapy   Manual therapy comments  trigger point release X 3 low back,  STW piriformis right while stretching Quadratus lumborum                PT Education - 06/22/18 1325    Education Details   self care,  exercise form    Person(s) Educated  Patient    Methods  Explanation;Demonstration;Verbal cues    Comprehension  Verbalized understanding;Returned demonstration       PT Short Term Goals - 06/19/18 0939      PT SHORT TERM GOAL #1   Title  Pt will be I with HEP for back positioning, pain relief, stabilization    Time  4    Period  Weeks    Status  On-going      PT SHORT TERM GOAL #2   Title  Pt will notice decreased pain in Rt LE with normal ADLs, improved 25%    Baseline  no change     Time  4    Period  Weeks    Status  On-going      PT SHORT TERM GOAL #3   Title  Pt will tolerate NuStep for 10 min with pain in knees, back no more than moderate to show improved exercise capacity.     Time  4    Period  Weeks    Status  Achieved      PT SHORT TERM GOAL #4   Title  Pt will be able to sit more comfortably for 1 hour for improved work tolerance.     Baseline  can sit 1-1.5 hours     Time  4    Period  Weeks    Status  Partially Met        PT Long Term Goals - 05/24/18 1007      PT LONG TERM GOAL #1   Title  Pt will be I with concepts of posture, body mechanics and lifting to protect back from future flare ups.     Time  8    Period  Weeks    Status  New    Target Date  07/19/18      PT LONG TERM GOAL #2   Title  Pt will be able to demonstrate hip abduction strength to 4/5 to improve knee and back pain, gait.  Time  8    Period  Weeks    Status  New    Target Date  07/19/18      PT LONG TERM GOAL #3   Title  Pt will be able to complete household tasks and ADLs with 50% less pain and difficulty.     Time  8    Period  Weeks    Status  New    Target Date  07/19/18      PT LONG TERM GOAL #4   Title  Pt will be able to work 4 hours with no more than moderate pain in back , most days of the week.     Time  8    Period  Weeks    Status  New    Target Date  07/19/18      PT LONG TERM GOAL #5   Title  FOTO score will improve to 40% to demo  improved functional mobility.     Time  8    Period  Weeks    Status  New    Target Date  07/19/18            Plan - 06/22/18 1326    Clinical Impression Statement  pain varies for patient.  Flared today.  Quadratus lumborun tight.  Trial of modalities for pain.  pain unchanged prior to modalities.    PT Next Visit Plan  Assess last session check today's HEP, NuStep, try anterior hip stretching and hip abuction in standing if possible.    PT Home Exercise Plan  prone for pain relief, LTR, hamstring, PPT and clam     Consulted and Agree with Plan of Care  Patient       Patient will benefit from skilled therapeutic intervention in order to improve the following deficits and impairments:     Visit Diagnosis: Radiculopathy, lumbar region  Chronic pain of right knee  Chronic pain of left knee  Muscle weakness (generalized)     Problem List Patient Active Problem List   Diagnosis Date Noted  . Sciatica of right side 05/24/2018  . Constipation 03/16/2018  . 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 Intramural leiomyoma of uterus 01/27/2016  . Mild depression ([place]) 09/25/2014  . Vitamin D deficiency 10/19/2013  . Seasonal allergies 09/07/2011  . Arthritis of knee, degenerative 10/29/2010  . Diet-controlled diabetes mellitus ([place]) 05/20/2010  . Low back pain 01/29/2010  . Hyperlipidemia LDL goal <70 10/01/2008  . Obesity 10/24/2006  . HYPERTENSION, BENIGN ESSENTIAL 09/26/2006    Precilla Purnell  PTA 06/22/2018, 1:30 PM  [facility] [address] [place], [place], [address] Phone: [phone]   Fax:  [phone]  Name: Valerie Carter MRN: [id number] Date of Birth: 1960/12/19

## 2018-06-26 ENCOUNTER — Ambulatory Visit: Payer: BLUE CROSS/BLUE SHIELD | Admitting: Physical Therapy

## 2018-06-29 ENCOUNTER — Ambulatory Visit: Payer: BLUE CROSS/BLUE SHIELD | Attending: Family Medicine | Admitting: Physical Therapy

## 2018-06-29 ENCOUNTER — Ambulatory Visit: Payer: BLUE CROSS/BLUE SHIELD

## 2018-06-29 ENCOUNTER — Encounter: Payer: Self-pay | Admitting: Physical Therapy

## 2018-06-29 DIAGNOSIS — M25562 Pain in left knee: Secondary | ICD-10-CM | POA: Insufficient documentation

## 2018-06-29 DIAGNOSIS — M6281 Muscle weakness (generalized): Secondary | ICD-10-CM | POA: Diagnosis not present

## 2018-06-29 DIAGNOSIS — M5416 Radiculopathy, lumbar region: Secondary | ICD-10-CM | POA: Diagnosis not present

## 2018-06-29 DIAGNOSIS — R209 Unspecified disturbances of skin sensation: Secondary | ICD-10-CM | POA: Diagnosis not present

## 2018-06-29 DIAGNOSIS — G8929 Other chronic pain: Secondary | ICD-10-CM

## 2018-06-29 DIAGNOSIS — R262 Difficulty in walking, not elsewhere classified: Secondary | ICD-10-CM

## 2018-06-29 DIAGNOSIS — M25561 Pain in right knee: Secondary | ICD-10-CM | POA: Diagnosis not present

## 2018-06-29 NOTE — Therapy (Signed)
[place], [place], [address] Phone: [phone]   Fax:  [phone]  Physical Therapy Treatment  Patient Details  Name: Valerie Carter MRN: [id number] Date of Birth: Jul 12, 1960 Referring Provider (PT): Dr. Shirley Martinique   Encounter Date: 06/29/2018  PT End of Session - 06/29/18 1158    Visit Number  6    Number of Visits  16    Date for PT Re-Evaluation  07/19/18    PT Start Time  1154   9 minutes late   PT Stop Time  1240    PT Time Calculation (min)  46 min       Past Medical History:  Diagnosis Date  . Back pain   . Carpal tunnel syndrome    while driving both hands  . Diabetes mellitus without complication ([place])    type 2  . DJD (degenerative joint disease)    both knees right worse than left  . Headache   . Hyperlipidemia   . Hypertension   . Intramural leiomyoma of uterus   . Knee pain   . Morbid obesity with BMI of 50.0-59.9, adult ([place])   . Vitamin D deficiency     Past Surgical History:  Procedure Laterality Date  . KNEE SURGERY Right 1999   torn cartlidge repair  . LAPAROSCOPIC GASTRIC SLEEVE RESECTION N/A 06/07/2017   Procedure: LAPAROSCOPIC GASTRIC SLEEVE RESECTION, UPPER ENDOSCOPY;  Surgeon: Kieth Brightly, Arta Bruce, MD;  Location: [facility];  Service: General;  Laterality: N/A;    There were no vitals filed for this visit.  Subjective Assessment - 06/29/18 1159    Subjective  Knees and back 3/10. Went to gym yesterday and could not get my legs to go around on the bike. I do not want to try the bike in PT today.     Pain Score  3     Pain Location  Back    Pain Orientation  Right;Left    Pain Descriptors / Indicators  Throbbing;Sharp    Aggravating Factors   not taking motrin    Pain Relieving Factors  motrin     Pain Score  3    Pain Location  Knee    Pain Orientation  Right;Left    Pain Descriptors / Indicators  Aching;Throbbing    Aggravating Factors   not taking  motrin    Pain Relieving Factors  motrin                        [facility] Adult PT Treatment/Exercise - 06/29/18 0001      Lumbar Exercises: Stretches   Lower Trunk Rotation  10 seconds    Lower Trunk Rotation Limitations  x 10 , and also with feet on ball     Hip Flexor Stretch Limitations  --      Lumbar Exercises: Standing   Functional Squats Limitations  mini- unable- pain and grinding on RLE      Other Standing Lumbar Exercises  hip abduction 10x 2 , hip extension 10 x 2 , hip flexion 10 x 2       Lumbar Exercises: Supine   Bridge  15 reps    Bridge Limitations  also with feet on ball     Straight Leg Raise  10 reps    Straight Leg Raises Limitations  with ab set     Large Ball Abdominal Isometric  5 seconds;15 reps    Large Ball Oblique  Isometric  10 reps      Knee/Hip Exercises: Aerobic   Nustep  10 LE only L3      Knee/Hip Exercises: Sidelying   Hip ABduction  10 reps;1 set;Right;Left    Clams  x 20       Moist Heat Therapy   Moist Heat Location  Lumbar Spine   quadratus lumborum              PT Short Term Goals - 06/19/18 0939      PT SHORT TERM GOAL #1   Title  Pt will be I with HEP for back positioning, pain relief, stabilization    Time  4    Period  Weeks    Status  On-going      PT SHORT TERM GOAL #2   Title  Pt will notice decreased pain in Rt LE with normal ADLs, improved 25%    Baseline  no change     Time  4    Period  Weeks    Status  On-going      PT SHORT TERM GOAL #3   Title  Pt will tolerate NuStep for 10 min with pain in knees, back no more than moderate to show improved exercise capacity.     Time  4    Period  Weeks    Status  Achieved      PT SHORT TERM GOAL #4   Title  Pt will be able to sit more comfortably for 1 hour for improved work tolerance.     Baseline  can sit 1-1.5 hours     Time  4    Period  Weeks    Status  Partially Met        PT Long Term Goals - 05/24/18 1007      PT LONG TERM GOAL #1    Title  Pt will be I with concepts of posture, body mechanics and lifting to protect back from future flare ups.     Time  8    Period  Weeks    Status  New    Target Date  07/19/18      PT LONG TERM GOAL #2   Title  Pt will be able to demonstrate hip abduction strength to 4/5 to improve knee and back pain, gait.     Time  8    Period  Weeks    Status  New    Target Date  07/19/18      PT LONG TERM GOAL #3   Title  Pt will be able to complete household tasks and ADLs with 50% less pain and difficulty.     Time  8    Period  Weeks    Status  New    Target Date  07/19/18      PT LONG TERM GOAL #4   Title  Pt will be able to work 4 hours with no more than moderate pain in back , most days of the week.     Time  8    Period  Weeks    Status  New    Target Date  07/19/18      PT LONG TERM GOAL #5   Title  FOTO score will improve to 40% to demo improved functional mobility.     Time  8    Period  Weeks    Status  New    Target Date  07/19/18  Plan - 06/29/18 1203    Clinical Impression Statement  Pt arrives late today. Tried the recumbent bike at the gym yesterday and was unable to make revolutions. She did not want to try on our bike today. Continued with hip and core strength as tolerated. Improved tolerance to sidelying hip strengthening.     PT Next Visit Plan  Assess last session check today's HEP, NuStep, try anterior hip stretching and hip abuction in standing if possible.    PT Home Exercise Plan  prone for pain relief, LTR, hamstring, PPT and clam     Consulted and Agree with Plan of Care  Patient       Patient will benefit from skilled therapeutic intervention in order to improve the following deficits and impairments:  Abnormal gait, Decreased balance, Decreased endurance, Decreased mobility, Decreased activity tolerance, Increased fascial restricitons, Impaired flexibility, Postural dysfunction, Pain, Obesity, Improper body mechanics, Hypomobility,  Difficulty walking, Decreased range of motion, Decreased strength  Visit Diagnosis: Radiculopathy, lumbar region  Chronic pain of right knee  Chronic pain of left knee  Muscle weakness (generalized)  Unspecified disturbances of skin sensation  Difficulty in walking, not elsewhere classified     Problem List Patient Active Problem List   Diagnosis Date Noted  . Sciatica of right side 05/24/2018  . Constipation 03/16/2018  . 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 Intramural leiomyoma of uterus 01/27/2016  . Mild depression ([place]) 09/25/2014  . Vitamin D deficiency 10/19/2013  . Seasonal allergies 09/07/2011  . Arthritis of knee, degenerative 10/29/2010  . Diet-controlled diabetes mellitus ([place]) 05/20/2010  . Low back pain 01/29/2010  . Hyperlipidemia LDL goal <70 10/01/2008  . Obesity 10/24/2006  . HYPERTENSION, BENIGN ESSENTIAL 09/26/2006    Dorene Ar, PTA 06/29/2018, 12:56 PM  [facility] [address] [place], [place], [address] Phone: [phone]   Fax:  [phone]  Name: Valerie Carter MRN: [id number] Date of Birth: [birth year]

## 2018-07-04 ENCOUNTER — Encounter: Payer: BLUE CROSS/BLUE SHIELD | Admitting: Physical Therapy

## 2018-07-10 ENCOUNTER — Ambulatory Visit: Payer: BLUE CROSS/BLUE SHIELD | Admitting: Physical Therapy

## 2018-07-10 ENCOUNTER — Encounter: Payer: Self-pay | Admitting: Physical Therapy

## 2018-07-10 DIAGNOSIS — M25562 Pain in left knee: Secondary | ICD-10-CM | POA: Diagnosis not present

## 2018-07-10 DIAGNOSIS — G8929 Other chronic pain: Secondary | ICD-10-CM | POA: Diagnosis not present

## 2018-07-10 DIAGNOSIS — M6281 Muscle weakness (generalized): Secondary | ICD-10-CM | POA: Diagnosis not present

## 2018-07-10 DIAGNOSIS — R209 Unspecified disturbances of skin sensation: Secondary | ICD-10-CM

## 2018-07-10 DIAGNOSIS — R262 Difficulty in walking, not elsewhere classified: Secondary | ICD-10-CM

## 2018-07-10 DIAGNOSIS — M5416 Radiculopathy, lumbar region: Secondary | ICD-10-CM | POA: Diagnosis not present

## 2018-07-10 DIAGNOSIS — M25561 Pain in right knee: Secondary | ICD-10-CM | POA: Diagnosis not present

## 2018-07-10 NOTE — Therapy (Addendum)
[place], [place], [address] Phone: [phone]   Fax:  [phone]  Physical Therapy Treatment/Discharge  Patient Details  Name: Valerie Carter MRN: [id number] Date of Birth: 09-Nov-1960 Referring Provider (PT): Dr. Shirley Martinique   Encounter Date: 07/10/2018  PT End of Session - 07/10/18 1109    Visit Number  7    Number of Visits  16    Date for PT Re-Evaluation  07/19/18    PT Start Time  1102    PT Stop Time  1145    PT Time Calculation (min)  43 min    Activity Tolerance  Patient tolerated treatment well    Behavior During Therapy  [facility] for tasks assessed/performed       Past Medical History:  Diagnosis Date  . Back pain   . Carpal tunnel syndrome    while driving both hands  . Diabetes mellitus without complication ([place])    type 2  . DJD (degenerative joint disease)    both knees right worse than left  . Headache   . Hyperlipidemia   . Hypertension   . Intramural leiomyoma of uterus   . Knee pain   . Morbid obesity with BMI of 50.0-59.9, adult ([place])   . Vitamin D deficiency     Past Surgical History:  Procedure Laterality Date  . KNEE SURGERY Right 1999   torn cartlidge repair  . LAPAROSCOPIC GASTRIC SLEEVE RESECTION N/A 06/07/2017   Procedure: LAPAROSCOPIC GASTRIC SLEEVE RESECTION, UPPER ENDOSCOPY;  Surgeon: Kieth Brightly, Arta Bruce, MD;  Location: [facility];  Service: General;  Laterality: N/A;    There were no vitals filed for this visit.  Subjective Assessment - 07/10/18 1106    Subjective  Back pain 3/10 radiating to Rt anterior knee.  The back pain is getting better overall.  Knees are 5/10.  Took Motrin. Going to the gym.  Treadmill, leg press, upper body .              [facility] Adult PT Treatment/Exercise - 07/10/18 0001      Lumbar Exercises: Standing   Functional Squats Limitations  done in parallel bars, unable against with wall       Lumbar Exercises: Seated   Other  Seated Lumbar Exercises  seated core with magic circle x 8 full exhale for abd       Lumbar Exercises: Supine   Ab Set  10 reps    AB Set Limitations  with magic circle squeeze    Clam  10 reps    Clam Limitations  bilateral and unilateral     Heel Slides  10 reps    Heel Slides Limitations  with ball     Bridge  15 reps    Bridge Limitations  also with feet on ball     Basic Lumbar Stabilization  10 reps    Basic Lumbar Stabilization Limitations  unilateral arm fall out 4 lbs, lat pull over x 4 kbs x 10 added hip flexion for (neutral) core     Large Ball Oblique Isometric  10 reps    Large Ball Oblique Isometric Limitations  cues to exhale and sink abdominals                PT Short Term Goals - 07/10/18 1303      PT SHORT TERM GOAL #1   Title  Pt will be I with HEP for back positioning, pain relief, stabilization  Status  Achieved      PT SHORT TERM GOAL #2   Title  Pt will notice decreased pain in Rt LE with normal ADLs, improved 25%    Baseline  better in low back, but not 25%    Status  On-going      PT SHORT TERM GOAL #3   Title  Pt will tolerate NuStep for 10 min with pain in knees, back no more than moderate to show improved exercise capacity.     Status  On-going      PT SHORT TERM GOAL #4   Title  Pt will be able to sit more comfortably for 1 hour for improved work tolerance.     Status  On-going        PT Long Term Goals - 07/10/18 1303      PT LONG TERM GOAL #1   Title  Pt will be I with concepts of posture, body mechanics and lifting to protect back from future flare ups.     Status  On-going      PT LONG TERM GOAL #2   Title  Pt will be able to demonstrate hip abduction strength to 4/5 to improve knee and back pain, gait.     Status  On-going      PT LONG TERM GOAL #3   Title  Pt will be able to complete household tasks and ADLs with 50% less pain and difficulty.     Status  On-going      PT LONG TERM GOAL #4   Title  Pt will be able to  work 4 hours with no more than moderate pain in back , most days of the week.     Status  On-going      PT LONG TERM GOAL #5   Title  FOTO score will improve to 40% to demo improved functional mobility.     Status  On-going            Plan - 07/10/18 1109    Clinical Impression Statement  Tried to work in standing today but pain in knees was severe.  Discussed safety with gym exercises and what to avoid.  She bought a stability ball so we reviewed her home routine, ideas for core exercises. Limited by knee pain today with all exercises.      PT Treatment/Interventions  ADLs/Self Care Home Management;Cryotherapy;Ultrasound;Traction;Moist Heat;Electrical Stimulation;DME Instruction;Therapeutic exercise;Balance training;Patient/family education;Manual techniques;Passive range of motion;Taping;Neuromuscular re-education;Functional mobility training;Therapeutic activities;Gait training;Dry needling    PT Next Visit Plan  Goals related to work.  cont. NuStep, try seated core on ball, try anterior hip stretching and hip abuction in standing if possible. add in more core challenge with UE     PT Home Exercise Plan  prone for pain relief, LTR, hamstring, PPT and clam , ball exercises sitting, supine     Consulted and Agree with Plan of Care  Patient       Patient will benefit from skilled therapeutic intervention in order to improve the following deficits and impairments:  Abnormal gait, Decreased balance, Decreased endurance, Decreased mobility, Decreased activity tolerance, Increased fascial restricitons, Impaired flexibility, Postural dysfunction, Pain, Obesity, Improper body mechanics, Hypomobility, Difficulty walking, Decreased range of motion, Decreased strength  Visit Diagnosis: Radiculopathy, lumbar region  Chronic pain of right knee  Chronic pain of left knee  Muscle weakness (generalized)  Unspecified disturbances of skin sensation  Difficulty in walking, not elsewhere  classified     Problem List Patient  Active Problem List   Diagnosis Date Noted  . Sciatica of right side 05/24/2018  . Constipation 03/16/2018  . 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 Intramural leiomyoma of uterus 01/27/2016  . Mild depression ([place]) 09/25/2014  . Vitamin D deficiency 10/19/2013  . Seasonal allergies 09/07/2011  . Arthritis of knee, degenerative 10/29/2010  . Diet-controlled diabetes mellitus ([place]) 05/20/2010  . Low back pain 01/29/2010  . Hyperlipidemia LDL goal <70 10/01/2008  . Obesity 10/24/2006  . HYPERTENSION, BENIGN ESSENTIAL 09/26/2006    , 07/10/2018, 1:04 PM  [facility] [address] [place], [place], [address] Phone: [phone]   Fax:  [phone]  Name: Valerie Carter MRN: [id number] Date of Birth: [birth year]  Raeford Razor, PT 07/10/18 1:04 PM Phone: [phone] Fax: [phone]  PHYSICAL THERAPY DISCHARGE SUMMARY  Visits from Start of Care: 7  Current functional level related to goals / functional outcomes: See above , unknown    Remaining deficits: Unknown    Education / Equipment: HEP, see above  Plan: Patient agrees to discharge.  Patient goals were not met. Patient is being discharged due to not returning since the last visit.  ?????    Raeford Razor, PT 08/30/18 2:03 PM Phone: [phone] Fax: [phone]

## 2018-07-13 ENCOUNTER — Ambulatory Visit: Payer: BLUE CROSS/BLUE SHIELD | Admitting: Physical Therapy

## 2018-07-18 ENCOUNTER — Ambulatory Visit: Payer: BLUE CROSS/BLUE SHIELD | Admitting: Physical Therapy

## 2018-07-20 ENCOUNTER — Other Ambulatory Visit: Payer: Self-pay | Admitting: Family Medicine

## 2018-07-20 MED ORDER — HYDROCODONE-ACETAMINOPHEN 10-325 MG PO TABS: ORAL_TABLET | ORAL | 0 refills | Status: DC

## 2018-07-28 ENCOUNTER — Ambulatory Visit: Payer: BLUE CROSS/BLUE SHIELD

## 2018-08-18 ENCOUNTER — Ambulatory Visit: Payer: BLUE CROSS/BLUE SHIELD | Admitting: Family Medicine

## 2018-08-22 ENCOUNTER — Telehealth: Payer: Self-pay

## 2018-08-22 MED ORDER — HYDROCODONE-ACETAMINOPHEN 10-325 MG PO TABS: ORAL_TABLET | ORAL | 0 refills | Status: DC

## 2018-08-22 NOTE — Telephone Encounter (Signed)
Refill done.  

## 2018-08-24 ENCOUNTER — Ambulatory Visit: Payer: BLUE CROSS/BLUE SHIELD | Admitting: Family Medicine

## 2018-08-30 ENCOUNTER — Ambulatory Visit: Payer: BLUE CROSS/BLUE SHIELD

## 2018-09-15 ENCOUNTER — Ambulatory Visit (INDEPENDENT_AMBULATORY_CARE_PROVIDER_SITE_OTHER): Payer: BLUE CROSS/BLUE SHIELD | Admitting: Family Medicine

## 2018-09-15 ENCOUNTER — Other Ambulatory Visit: Payer: Self-pay

## 2018-09-15 ENCOUNTER — Encounter: Payer: Self-pay | Admitting: Family Medicine

## 2018-09-15 ENCOUNTER — Ambulatory Visit: Payer: BLUE CROSS/BLUE SHIELD | Admitting: Family Medicine

## 2018-09-15 DIAGNOSIS — M170 Bilateral primary osteoarthritis of knee: Secondary | ICD-10-CM | POA: Diagnosis not present

## 2018-09-15 MED ORDER — HYDROCODONE-ACETAMINOPHEN 10-325 MG PO TABS: 1.0000 | ORAL_TABLET | Freq: Two times a day (BID) | ORAL | 0 refills | Status: DC | PRN

## 2018-09-15 MED ORDER — METHYLPREDNISOLONE ACETATE 40 MG/ML IJ SUSP
40.0000 mg | Freq: Once | INTRAMUSCULAR | Status: AC
  Administered 2018-09-15: 40 mg via INTRA_ARTICULAR

## 2018-09-15 NOTE — Assessment & Plan Note (Signed)
She is currently out of work due to coronavirus.  It is unlikely she will return to work for several months.  She has a lot of pain on the lower dose of the hydrocodone so while she is out of work we will go ahead and increase her back to her previous dose.  Also gave her bilateral corticosteroid injections today.  I encouraged her to continue to exercise with upper body weight lifting see if she still can build some muscle mass and continue to lose weight as I think it is imperative that she gets her knee replaced at some point in the near future.

## 2018-09-15 NOTE — Progress Notes (Signed)
    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  She is feeling a little frustrated as she is having to struggle to lose weight.  She needs to get her knee replaced but financially now that she is out of work that may be problematic as well.  Pain is 10 out of 10 all the time.  She does very little except her ADLs and work. REVIEW OF SYSTEMS: Usual fever, no unusual weight change.  PERTINENT  PMH / PSH: I have reviewed the patient's medications, allergies, past medical and surgical history, smoking status and updated in the EMR as appropriate.   OBJECTIVE: GENERAL: Well-developed female no acute distress Knees: Bilaterally symmetrical.  Medial joint line tenderness bilaterally.  She lacks full range of extension on the left knee by about 5 degrees but has full range on the right knee.  Full range of motion and flexion bilaterally.  The calf is soft bilaterally.  The popliteal space is benign. VASCULAR: Dorsalis pedis pulses are 2+ bilateral symmetrical NEURO: Intact sensation soft touch bilateral lower extremities  PROCEDURE: INJECTION: Patient was given informed consent, signed copy in the chart. Appropriate time out was taken. Area prepped and draped in usual sterile fashion. Ethyl chloride was  used for local anesthesia. A 21 gauge 1 1/2 inch needle was used.. 1 cc of methylprednisolone 40 mg/ml plus 4 cc of 1% lidocaine without epinephrine was injected into the bilateral knees using a(n) anterior medial approach.   The patient tolerated the procedure well. There were no complications. Post procedure instructions were given.   ASSESSMENT / PLAN:  Arthritis of knee, degenerative She is currently out of work due to coronavirus.  It is  unlikely she will return to work for several months.  She has a lot of pain on the lower dose of the hydrocodone so while she is out of work we will go ahead and increase her back to her previous dose.  Also gave her bilateral corticosteroid injections today.  I encouraged her to continue to exercise with upper body weight lifting see if she still can build some muscle mass and continue to lose weight as I think it is imperative that she gets her knee replaced at some point in the near future.

## 2018-09-15 NOTE — Assessment & Plan Note (Signed)
>>  ASSESSMENT AND PLAN FOR ARTHRITIS OF KNEE, DEGENERATIVE WRITTEN ON 09/15/2018 11:20 AM BY Nolan Tuazon L, MD  She is currently out of work due to coronavirus.  It is unlikely she will return to work for several months.  She has a lot of pain on the lower dose of the hydrocodone  so while she is out of work we will go ahead and increase her back to her previous dose.  Also gave her bilateral corticosteroid injections today.  I encouraged her to continue to exercise with upper body weight lifting see if she still can build some muscle mass and continue to lose weight as I think it is imperative that she gets her knee replaced at some point in the near future.

## 2018-11-03 ENCOUNTER — Other Ambulatory Visit: Payer: Self-pay | Admitting: Family Medicine

## 2018-11-03 DIAGNOSIS — M170 Bilateral primary osteoarthritis of knee: Secondary | ICD-10-CM

## 2018-11-03 MED ORDER — HYDROCODONE-ACETAMINOPHEN 10-325 MG PO TABS: 1.0000 | ORAL_TABLET | Freq: Two times a day (BID) | ORAL | 0 refills | Status: DC | PRN

## 2018-11-03 NOTE — Progress Notes (Signed)
Needs refill

## 2018-12-04 ENCOUNTER — Other Ambulatory Visit: Payer: Self-pay | Admitting: Family Medicine

## 2018-12-04 DIAGNOSIS — Z1231 Encounter for screening mammogram for malignant neoplasm of breast: Secondary | ICD-10-CM

## 2018-12-12 ENCOUNTER — Telehealth: Payer: Self-pay | Admitting: *Deleted

## 2018-12-12 DIAGNOSIS — M170 Bilateral primary osteoarthritis of knee: Secondary | ICD-10-CM

## 2018-12-13 MED ORDER — HYDROCODONE-ACETAMINOPHEN 10-325 MG PO TABS: 1.0000 | ORAL_TABLET | Freq: Two times a day (BID) | ORAL | 0 refills | Status: DC | PRN

## 2018-12-13 NOTE — Telephone Encounter (Signed)
refilled 

## 2019-01-05 ENCOUNTER — Other Ambulatory Visit: Payer: Self-pay

## 2019-01-05 ENCOUNTER — Encounter: Payer: Self-pay | Admitting: Family Medicine

## 2019-01-05 ENCOUNTER — Ambulatory Visit: Payer: BC Managed Care – PPO | Admitting: Family Medicine

## 2019-01-05 DIAGNOSIS — M170 Bilateral primary osteoarthritis of knee: Secondary | ICD-10-CM

## 2019-01-05 DIAGNOSIS — M7542 Impingement syndrome of left shoulder: Secondary | ICD-10-CM | POA: Diagnosis not present

## 2019-01-05 DIAGNOSIS — M25512 Pain in left shoulder: Secondary | ICD-10-CM

## 2019-01-05 MED ORDER — METHYLPREDNISOLONE ACETATE 40 MG/ML IJ SUSP
40.0000 mg | Freq: Once | INTRAMUSCULAR | Status: AC
  Administered 2019-01-05: 40 mg via INTRA_ARTICULAR

## 2019-01-07 DIAGNOSIS — M7542 Impingement syndrome of left shoulder: Secondary | ICD-10-CM | POA: Insufficient documentation

## 2019-01-07 DIAGNOSIS — M75100 Unspecified rotator cuff tear or rupture of unspecified shoulder, not specified as traumatic: Secondary | ICD-10-CM | POA: Insufficient documentation

## 2019-01-07 NOTE — Assessment & Plan Note (Signed)
CSI today Gave her HEP and therabnd

## 2019-01-07 NOTE — Assessment & Plan Note (Signed)
>>  ASSESSMENT AND PLAN FOR ARTHRITIS OF KNEE, DEGENERATIVE WRITTEN ON 01/07/2019 10:26 AM BY Shawni Volkov L, MD  Reviewed her Xrays and explained pain No bone spur intervention available Encouraged her to get back on weight loss issues  Gave her photocopy of knee Xrays (for her refrigerator door as incentive at her request) Continue pain meds F/u 4 weeks, virtual or in person visit [facility]

## 2019-01-07 NOTE — Assessment & Plan Note (Signed)
>>  ASSESSMENT AND PLAN FOR ROTATOR CUFF IMPINGEMENT SYNDROME OF LEFT SHOULDER WRITTEN ON 01/07/2019 10:27 AM BY Obed Samek L, MD  CSI today Gave her HEP and therabnd

## 2019-01-07 NOTE — Assessment & Plan Note (Signed)
Reviewed her Xrays and explained pain No bone spur intervention available Encouraged her to get back on weight loss issues  Gave her photocopy of knee Xrays (for her refrigerator door as incentive at her request) Continue pain meds F/u 4 weeks, virtual or in person visit [facility]

## 2019-01-07 NOTE — Progress Notes (Signed)
  Valerie Carter - 58 y.o. female MRN [id number]  Date of birth: 1960-10-15    SUBJECTIVE:      Chief Complaint:/ HPI:   1. B knee pain Still ls before out of work from [employer] pandemic so she is using more of her pain meds. Despite that pain seems to be progressing  Has gained some weight and is very frustrated with herself as she now needs to lose 60 pounds before she can have the surgery  Has questions about why the pain is so severe on anterior lateral joint line, especially of left knee. Wonders if she has a bone spur there and could something be done about that specifically, even before surgery? 2. Left shoulder bothering her--pain is with elevation above shoulder height. Keeps her awake at night. No new activity and no known specific injury. Right hand dominant    ROS:     Pain and unintentional weight gain as per hpi  PERTINENT  PMH / PSH FH / / SH:  Past Medical, Surgical, Social, and Family History Reviewed & Updated in the EMR.  Pertinent findings include:  Sleeve gastrectomy highest weight 390  OBJECTIVE: BP (!) 169/86   Ht 5' 4.5" (1.638 m)   Wt 290 lb (131.5 kg)   BMI 49.01 kg/m   Physical Exam:  Vital signs are reviewed. GEN WD WN NAD Overweight KNEES: B she has pain with full extension. TTP lateral > medial joint line both knees. Lacks full flexion by about 10 degrees. ligamentously intact Bilaterally. Synovial hypertrophy to palpation Bilaterally. No effusion. SHOULDERS: symmetrical Right has FROM and normal strength. Left has ROm limited by pain (attainable by passive) above 90 degrees in ABduction and forward flexion. Strentht essentially. intact in all planes of rotator cuff, but painful above shoulder height efforts.  PROCEDURE: INJECTION: Patient was given informed consent, signed copy in the chart. Appropriate time out was taken. Area prepped and draped in usual sterile fashion. Ethyl chloride was  used for local anesthesia. A 21 gauge 1 1/2 inch needle was  used.. 1 cc of methylprednisolone 40 mg/ml plus  4 cc of 1% lidocaine without epinephrine was injected into the bilateral using a(n) medial anterior approach.   The patient tolerated the procedure well. There were no complications. Post procedure instructions were given.  PROCEDURE: INJECTION: Patient was given informed consent, signed copy in the chart. Appropriate time out was taken. Area prepped and draped in usual sterile fashion. Ethyl chloride was  used for local anesthesia. A 21 gauge 1 1/2 inch needle was used.. 1 cc of methylprednisolone 40 mg/ml plus  4 cc of 1% lidocaine without epinephrine was injected into the left subacromial bursa using a(n) posterior approach.   The patient tolerated the procedure well. There were no complications. Post procedure instructions were given.    ASSESSMENT & PLAN: Arthritis of knee, degenerative Reviewed her Xrays and explained pain No bone spur intervention available Encouraged her to get back on weight loss issues  Gave her photocopy of knee Xrays (for her refrigerator door as incentive at her request) Continue pain meds F/u 4 weeks, virtual or in person visit [facility]  Rotator cuff impingement syndrome of left shoulder CSI today Gave her HEP and therabnd   See problem based charting & AVS for pt instructions.

## 2019-01-15 ENCOUNTER — Telehealth: Payer: Self-pay | Admitting: Family Medicine

## 2019-01-15 DIAGNOSIS — M170 Bilateral primary osteoarthritis of knee: Secondary | ICD-10-CM

## 2019-01-15 MED ORDER — HYDROCODONE-ACETAMINOPHEN 10-325 MG PO TABS: 1.0000 | ORAL_TABLET | Freq: Two times a day (BID) | ORAL | 0 refills | Status: DC | PRN

## 2019-01-15 NOTE — Telephone Encounter (Signed)
Patient needs her Hyrocodone refilled, sent to [facility] on [facility].

## 2019-01-22 ENCOUNTER — Ambulatory Visit: Payer: BLUE CROSS/BLUE SHIELD

## 2019-02-15 ENCOUNTER — Telehealth: Payer: Self-pay | Admitting: *Deleted

## 2019-02-15 DIAGNOSIS — M170 Bilateral primary osteoarthritis of knee: Secondary | ICD-10-CM

## 2019-02-16 MED ORDER — HYDROCODONE-ACETAMINOPHEN 10-325 MG PO TABS: ORAL_TABLET | ORAL | 0 refills | Status: DC

## 2019-02-16 NOTE — Telephone Encounter (Signed)
refilled 

## 2019-02-19 ENCOUNTER — Telehealth: Payer: Self-pay | Admitting: *Deleted

## 2019-02-19 DIAGNOSIS — M170 Bilateral primary osteoarthritis of knee: Secondary | ICD-10-CM

## 2019-02-20 MED ORDER — HYDROCODONE-ACETAMINOPHEN 10-325 MG PO TABS: ORAL_TABLET | ORAL | 0 refills | Status: DC

## 2019-02-20 NOTE — Telephone Encounter (Signed)
rx had no sig so I reordered it

## 2019-02-26 ENCOUNTER — Other Ambulatory Visit: Payer: Self-pay

## 2019-02-26 ENCOUNTER — Ambulatory Visit (INDEPENDENT_AMBULATORY_CARE_PROVIDER_SITE_OTHER): Payer: BC Managed Care – PPO | Admitting: Family Medicine

## 2019-02-26 NOTE — Progress Notes (Signed)
Telehealth Encounter I connected with Valerie Carter (MRN [id number]) on 02/26/2019 by MyChart video-enabled, HIPAA-compliant telemedicine application, verified that I am speaking with the correct person using two identifiers, and that the patient was in a private environment conducive to confidentiality.  I discussed the limitations of evaluation and management by telemedicine. The patient expressed understanding and agreed to proceed.  Provider was Kennith Center, PhD, RD, LDN, CEDRD Provider was located at home during this telehealth encounter; patient was at home  Appt start time: 1100 end time: 1200 (1 hour)  Reason for telehealth visit: Medical Nutrition Therapy related to weight gain post-bariatric surgery (E66.01, Obesity; BMI >49).    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  Weight loss proceeded as expected through October 2019, but has fluctuated since then from 273 lb to most recent self-reported weight of 302 lb.  [name] has chronic knee pain, but her surgeon has required that she weigh no more than 230 lb before he will do knee replacement. This is a big motivator for her.  [name] does not get the same feeling of fullness she got early on after surgery.    Assessment:  FBG have been 90-110 most days.   Usual eating pattern: 2 meals and 4 snacks per day.  Usually skips breakfast.  Frequent foods and beverages: water, diet fruit drinks; chx, salmon, fish.   Avoided foods: most fruit, milk (lact-intol), most red meat, egg whites (disliked).   Usual physical activity: walks ~40 min 3-4 X wk.  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  She cares for her 4- and 6-YO grandchildren 3-4 evenings/week.   Sleep: Estimates she gets 5-6 hrs/night.  Naps usually ~1 hr/day.    24-hr recall:  (Up at 7 AM; awake since 5:30) 8:30 AM - Went for ~40-minute walk.   B ( AM)-   Snk ( AM)-   L (11:30 AM)-   1 [employer] chx bbq sandwich, water Snk (1:30 PM)-32 oz water,  Snk (3:30 PM)-2 bags potato chips, 32 oz diet tea D (6:30 PM)-  2 chx wings, side salad, 2-3 tbsp drsng, water Snk (9:30 PM)-3 oatmeal cookies [employer]), water  Typical day? Yes.    Estimates she drinks ~1 gallon water per day.    Intervention: Asked Ladeja what she knows she should change about her diet to continue weight loss.  She understands she needs to eat more veg's and fruit, and to snack less.  We established some specific behavioral goals toward this end.    For recommendations and goals, see Patient Instructions.    Follow-up: 2 weeks via MyChart video platform.    SYKES,JEANNIE

## 2019-02-26 NOTE — Patient Instructions (Addendum)
1. Eat at least 3 REAL (small) meals and 1-3 snacks per day.  Aim for no more than 5 hours between eating.  Eat breakfast within one hour of getting up.  A REAL meal includes at least some protein, some starch*, and vegetables and/or fruit.  OR:  Would you serve this to a guest in your home, and call it a meal? *Limit carbs to 1 portion (15 grams) per meal.    ONE portion of a carb food equals:  - 1 slice of bread (or its equivalent, such as half of a hamburger bun).  - 1/2 cup of a "scoopable" starchy food such as potatoes or rice.  - 15 grams of Total Carbohydrate as shown on food label.   2. Be cautious with hyper-palatable foods and ingredients - sugar, salt, and fat. Keep in mind that taste preferences are learned through exposure and repetition.  When you learn to truly prefer a healthy food, then nutritious choices are to make.   Remember:  - When we don't get vegetables at meals, we don't get vegetables at all.  Vegetables and fruit are highly nutritious for [place], and by omitting them, we are really missing an opportunity! - Use small plates and utensils, including serving spoons.   - Plan ahead for meals and snacks as often as you can.    - Your high water intake of ~a gallon per day may be helping your body adapt to larger stomach volume.  You should be fine with half this volume of water, about 64 ounces per day.    Follow-up on Monday, Sept 14 at 12 noon via [employer].  AT FOLLOW-UP, WE WILL ESTABLISH A WAY FOR YOU TO DOCUMENT YOUR PROGRESS.

## 2019-03-07 ENCOUNTER — Other Ambulatory Visit: Payer: Self-pay

## 2019-03-07 ENCOUNTER — Ambulatory Visit
Admission: RE | Admit: 2019-03-07 | Discharge: 2019-03-07 | Disposition: A | Discharge status: Home / Self Care | Payer: BC Managed Care – PPO | Source: Ambulatory Visit | Attending: *Deleted | Admitting: *Deleted

## 2019-03-07 DIAGNOSIS — Z1231 Encounter for screening mammogram for malignant neoplasm of breast: Secondary | ICD-10-CM | POA: Diagnosis not present

## 2019-03-12 ENCOUNTER — Other Ambulatory Visit: Payer: Self-pay | Admitting: Family Medicine

## 2019-03-12 ENCOUNTER — Other Ambulatory Visit: Payer: Self-pay

## 2019-03-12 ENCOUNTER — Ambulatory Visit (INDEPENDENT_AMBULATORY_CARE_PROVIDER_SITE_OTHER): Payer: BC Managed Care – PPO | Admitting: Family Medicine

## 2019-03-12 DIAGNOSIS — Z6841 Body Mass Index (BMI) 40.0 and over, adult: Secondary | ICD-10-CM | POA: Diagnosis not present

## 2019-03-12 DIAGNOSIS — M170 Bilateral primary osteoarthritis of knee: Secondary | ICD-10-CM

## 2019-03-12 DIAGNOSIS — M5441 Lumbago with sciatica, right side: Secondary | ICD-10-CM

## 2019-03-12 NOTE — Progress Notes (Signed)
Telehealth Encounter PCP Shirley Martinique, DO  I connected with Jomana Dragoo (MRN [id number]) on 03/12/2019 by MyChart video-enabled, HIPAA-compliant telemedicine application, verified that I am speaking with the correct person using two identifiers, and that the patient was in a private environment conducive to confidentiality.  I discussed the limitations of evaluation and management by telemedicine. The patient expressed understanding and agreed to proceed.  Provider was Kennith Center, PhD, RD, LDN, CEDRD Provider was located at home during this telehealth encounter; patient was at home  Appt start time: 1200 end time: 1300 (1 hour)  Reason for telehealth visit: Medical Nutrition Therapy related to weight gain post-bariatric surgery (E66.01, Obesity; BMI >49).    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  Weight loss proceeded as expected through October 2019, but has fluctuated since then from 273 lb to most recent self-reported weight of 302 lb.  Laguanda has chronic knee pain, but her surgeon has required that she weigh no more than 230 lb before he will do knee replacement. This is a big motivator for her.  Assessment:  Drinking ~5 16-oz bottle of water/day, which has been easier for her to manage than the gallon she had been doing daily.   FBG have been 90-110 most days.   Recent eating pattern: 3 meals & 2 snacks most days.   Recent physical activity: Walks ~40 min 3-4 X wk.   Sleep: Estimates she gets 5-6 hrs/night.  Naps usually ~1 hr/day.    24-hr recall:  (Up at 8 AM) B (8:30 AM)-  1 [place] sausage patty, 1 1/2 scrmbld egg, 1/2 c cooked grits Snk ( AM)-  Water Snk ( 1PM)-  1/2 c fresh pineapple L (2:30 PM)-  3 tbsp pimiento chs, 5 Ritz crkrs, water  - Napped from 3:25 to 5:45 PM -  Snk ( PM)-  --- D (6:30 PM)-  2 chx wings, 1/4 c lima beans, side salad, 4 tbsp Ital drsng Snk ( PM)-  --- Typical day? Yes.    Intervention:  Reviewed progress on goals set at 8/31 appt, and modified for upcoming weeks.  Established a Goals Sheet to monitor progress.  I also will send Dr. Enid Derry a message requesting PT referral so Anysia can learn what exercises she can do to be as strong as possible pre-knee surgery.    For recommendations and goals, see Patient Instructions.    Follow-up: 2 weeks via MyChart video platform.    Deantre Bourdon,JEANNIE

## 2019-03-12 NOTE — Patient Instructions (Addendum)
-   Call [facility] to ask about the pool hours.  - Meanwhile, try out your stationary bike at home.  If your knees hurt more, stop!   Goals:  1. Eat at least 3 REAL (small) meals and 1-2 snacks per day.  Aim for no more than 5 hours between eating.  Eat breakfast within one hour of getting up.  A REAL meal includes at least some protein, some starch*, and vegetables and/or fruit.  OR:  Would you serve this to a guest in your home, and call it a meal? *Limit carbs to 1 portion (15 grams) per meal.   ONE portion of a carb food equals:  - 1 slice of bread (or its equivalent, such as half of a hamburger bun).  - 1/2 cup of a "scoopable" starchy food such as potatoes or rice.  - 15 grams of Total Carbohydrate as shown on food label.   2. Include vegetables with both lunch and dinner at least 5 days per week.    3. Exercise at least 15 minutes 6 X wk.    Complete your Goals Sheet provided today, and have this available for your follow-up visit on Monday, Sept 28 at 11 AM (via MyChart).

## 2019-03-21 ENCOUNTER — Telehealth: Payer: Self-pay | Admitting: Family Medicine

## 2019-03-21 DIAGNOSIS — M170 Bilateral primary osteoarthritis of knee: Secondary | ICD-10-CM

## 2019-03-21 MED ORDER — HYDROCODONE-ACETAMINOPHEN 10-325 MG PO TABS: ORAL_TABLET | ORAL | 0 refills | Status: DC

## 2019-03-21 NOTE — Telephone Encounter (Signed)
Pt calling to request refill of: Hydrocodone  Name of Medication(s):  hydrocodone Last date of OV:  01/05/2019 Pharmacy:  [facility]  Will route refill request to [employer].  Discussed with patient policy to call pharmacy for future refills.  Also, discussed refills may take up to 48 hours to approve or deny.  Eldred Manges Magtoto

## 2019-03-26 ENCOUNTER — Other Ambulatory Visit: Payer: Self-pay

## 2019-03-26 ENCOUNTER — Ambulatory Visit (INDEPENDENT_AMBULATORY_CARE_PROVIDER_SITE_OTHER): Payer: BC Managed Care – PPO | Admitting: Family Medicine

## 2019-03-26 NOTE — Patient Instructions (Addendum)
-   Call for a PT appt, and ask for [place] re. pool hours.   - Try to drink most of your water earlier in the day.     Goals:  1. Continue to limit carb to one portion per meal.       TRACK your carb intake, using both your app and the Goals Sheet, aiming for no more than 80 grams per day.     2. Include vegetables at both lunch and dinner.     Keep frozen veg's on hand for a quick veg option.   3. Exercise at least 15 minutes 6 X wk.    COMPLETE your Goals Sheet, and have this available at follow-up appointment.    Follow-up: 2 weeks via MyChart video platform:  Monday, Oct 12 at 11:30 AM.

## 2019-03-26 NOTE — Progress Notes (Signed)
Telehealth Encounter PCP Shirley Martinique, DO  I connected with Valerie Carter (MRN [id number]) on 03/26/2019 by MyChart video-enabled, HIPAA-compliant telemedicine application, verified that I am speaking with the correct person using two identifiers, and that the patient was in a private environment conducive to confidentiality.  I discussed the limitations of evaluation and management by telemedicine. The patient expressed understanding and agreed to proceed.  Provider was Kennith Center, PhD, RD, LDN, CEDRD Provider was located at [facility] during this telehealth encounter; patient was at home  Appt start time: 1100 end time: 1200 (1 hour)  Reason for telehealth visit: Medical Nutrition Therapy related to weight gain post-bariatric surgery (E66.01, Obesity; BMI >49).    Relevant history/background: Valerie had gastric sleeve bariatric surgery on 06-07-17.  Weight loss proceeded as expected through October 2019, but has fluctuated since then from 273 lb to most recent self-reported weight of 302 lb.  Valerie Carter has chronic knee pain, but her surgeon has required that she weigh no more than 230 lb before he will do knee replacement. This is a big motivator for her.  Assessment:  FBG have been 89-109 most days.   Weight:  Has not weighed this week, but previous weight check showed no change.   Recent eating pattern: 3 meals & 1 snack most days. Has limited carb foods to one per meal many days, but has not actually reviewed her success with this.  Has been using a food tracker app, which she checked during today's appt to find it indicated she is getting 90-130 g carbs and ~1500 kcal/day.    Recent physical activity: Walking ~40 min 3 X wk and chair exercising 2 X wk.   Sleep: Estimates she gets 6-7 hrs/night.  Naps ~1 hr ~3-4 X wk.   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  24-hr recall:  (Up at 7 AM) B (7:30 AM)-  1 c coffee, 1 tbsp flavored creamer, 8  oz protein powder in alm milk (20 g pro).  Snk (11:30)-  10-15 grapes, water L (12 PM)-  6 saltines, 2 oz cheddar chs, water Snk ( PM)-  --- D (6:30 PM)-  1/2 c cabbage, 1/2 c zucchini, skinless chx thigh, water Snk ( PM)-  1 c s-f Jell-O, 2 tbsp Cool Whip, water, diet green tea Typical day? Yes.    Intervention: Reviewed progress on goals set on 9/13, and modified for upcoming weeks. Discussed need to reduce kcal and carb further to realize weight loss.  Discussed importance of tracking and documenting adherence to behavioral goals.  Also discussed PT referral, which Valerie Carter wants to follow through on, although she is concerned about $115 co-pay that was required previously for PT.    For recommendations and goals, see Patient Instructions.    Follow-up: 2 weeks via MyChart video platform.    SYKES,JEANNIE

## 2019-04-09 ENCOUNTER — Encounter: Payer: Self-pay | Admitting: Family Medicine

## 2019-04-09 ENCOUNTER — Other Ambulatory Visit: Payer: Self-pay

## 2019-04-09 ENCOUNTER — Ambulatory Visit (INDEPENDENT_AMBULATORY_CARE_PROVIDER_SITE_OTHER): Payer: BC Managed Care – PPO | Admitting: Family Medicine

## 2019-04-09 NOTE — Progress Notes (Signed)
Telehealth Encounter PCP Shirley Martinique, DO  I connected with Valerie Carter (MRN [id number]) on 04/09/2019 by MyChart video-enabled, HIPAA-compliant telemedicine application, verified that I am speaking with the correct person using two identifiers, and that the patient was in a private environment conducive to confidentiality.  I discussed the limitations of evaluation and management by telemedicine. The patient expressed understanding and agreed to proceed.  Provider was Kennith Center, PhD, RD, LDN, CEDRD Provider was located at home during this telehealth encounter; patient was at home  Appt start time: 1130 end time: 1230 (1 hour)  Reason for telehealth visit: Medical Nutrition Therapy related to weight gain post-bariatric surgery (E66.01, Obesity; BMI >49).    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  Weight loss proceeded as expected through October 2019, but has fluctuated since then from 273 lb to most recent self-reported weight of 302 lb.  Valerie Carter has chronic knee pain, but her surgeon has required that she weigh no more than 230 lb before he will do knee replacement. This is a big motivator for her.  Assessment:  Valerie Carter purchased a Automotive [employer], which she has really liked.  Still hurts her knees, but a bit less than walking.   FBG have been 85-105 most days.   Weight:  Has lost 6 lb, down to 306 lb on 04/08/19.    PT referral: Has not yet called for appt; plans to do so today.  (Had to first pay for new dentures.)    Recent eating pattern: 3 meals & 1 snack most days. Has limited carb foods to one portion per meal.  Has continued using a food tracker app, which suggests she is getting an average of ~50 g CHO/day and only ~50 g protein daily.  She has also been documenting progress on Goals Sheet.   Recent physical activity: Chair exercising ~10 min 3 X wk and using elliptical ~10 min daily.  Also using some bands for upper  body resistance ex 3-4 X wk. Sleep: Still ~6-7 hrs/night.  Naps ~1 hr ~3-4 X wk.   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   24-hr recall:  (Up at 9 AM) B (9:30 AM)-  1 c coffee, 2 T flvrd creamer (10 g CHO, 60 kcal), pro pwdr (20 g pro, 110 kcal) in 6 oz alm milk)  Snk ( AM)-  water L (1:30 PM)-  1 salad, 2 oz chx, 2 T drsng Snk ( PM)-  water D (6:30 PM)-  1 salad, 2 oz chx, 2 T drsng, 6 [place] crkrs Snk (10 PM)-  4 crkrs, 2 T pimiento chs Typical day? Yes.    Intervention: Reviewed progress on goals set on 9/28, congratulated patient on good progress, and confirmed same goals, with slight modification to obtain more daily protein.    For recommendations and goals, see Patient Instructions.    Follow-up: 3 weeks via MyChart video platform.    Haji Delaine,Valerie Carter

## 2019-04-09 NOTE — Patient Instructions (Addendum)
Congratulations on doing great with your goals!    Goals remain the same: 1. Balance meals to contain:     - No more than one portion per meal and 80 grams/day.      - At least 75 g protein.       Each week, average your daily grams of protein and grams of carbohydrate, and record this on right margin of Goals Sheet.   2. Include vegetables at both lunch and dinner.     Keep frozen veg's on hand for a quick veg option.   3. Exercise at least 15 minutes 6 X wk.    Have your Goals Sheet available at follow-up appointment.    Follow-up: 3 weeks via MyChart video platform:  Monday, Nov 4 at 2 PM.

## 2019-04-20 ENCOUNTER — Ambulatory Visit (INDEPENDENT_AMBULATORY_CARE_PROVIDER_SITE_OTHER): Payer: BC Managed Care – PPO | Admitting: Family Medicine

## 2019-04-20 ENCOUNTER — Encounter: Payer: Self-pay | Admitting: Family Medicine

## 2019-04-20 ENCOUNTER — Other Ambulatory Visit: Payer: Self-pay

## 2019-04-20 DIAGNOSIS — M170 Bilateral primary osteoarthritis of knee: Secondary | ICD-10-CM | POA: Diagnosis not present

## 2019-04-20 MED ORDER — HYDROCODONE-ACETAMINOPHEN 10-325 MG PO TABS: ORAL_TABLET | ORAL | 0 refills | Status: DC

## 2019-04-20 MED ORDER — METHYLPREDNISOLONE ACETATE 40 MG/ML IJ SUSP
40.0000 mg | Freq: Once | INTRAMUSCULAR | Status: AC
  Administered 2019-04-20: 40 mg via INTRA_ARTICULAR

## 2019-04-21 NOTE — Assessment & Plan Note (Signed)
>>  ASSESSMENT AND PLAN FOR ARTHRITIS OF KNEE, DEGENERATIVE WRITTEN ON 04/21/2019  8:31 PM BY Marie Chow L, MD  Bilateral CSI today Refilled pain medicine Follow up 3 months, sooner wit problems.discussed weight gain

## 2019-04-21 NOTE — Progress Notes (Signed)
  Valerie Carter - 58 y.o. female MRN [id number]  Date of birth: 28-Jul-1960    SUBJECTIVE:      Chief Complaint:/ HPI:  Bilateral knee pain.  Requests corticosteroid injections bilaterally.  Injections give her 6 weeks of partial relief by about 50% and then another 4 to 6 weeks of 25% relief.  Pain is aching in nature 8-10 out of 10 at its worst.  Worse with standing walking.  She is currently been out of work secondary to [employer] and has unfortunately gained some weight.  She is quite unhappy about this.   ROS:     No fever, no other unusual arthralgias.  PERTINENT  PMH / PSH FH / / SH:  Past Medical, Surgical, Social, and Family History Reviewed & Updated in the EMR.  Pertinent findings include:  Never smoker  OBJECTIVE: BP (!) 166/85   Ht 5' 4.5" (1.638 m)   Wt 297 lb (134.7 kg)   BMI 50.19 kg/m   Physical Exam:  Vital signs are reviewed. GENERAL: Overweight female no acute distress knees: Symmetrical.  Trace effusion on the left.  Tender to palpation medial and lateral joint line the left, tender to palpation medial joint line on the right.  Popliteal space is benign.  Ligamentously intact to varus and valgus stress.  Normal [place]. VASCULAR: Posterior tibialis and dorsalis pedis pulses 2+ bilaterally symmetrical Neuro: Intact sensation soft touch bilateral lower extremity SKIN: Area around left knee and right knee without any sign of ecchymoses, no lesions, no unusual erythema.  PROCEDURE: INJECTION: Patient was given informed consent, signed copy in the chart. Appropriate time out was taken. Area prepped and draped in usual sterile fashion. Ethyl chloride was  used for local anesthesia. A 21 gauge 1 1/2 inch needle was used.. 1 cc of methylprednisolone 40 mg/ml plus 4 cc of 1% lidocaine without epinephrine was injected into the bilateral knees using a(n) anterior medial approach.   The patient tolerated the procedure well. There were no complications. Post procedure  instructions were given.   ASSESSMENT & PLAN:  See problem based charting & AVS for pt instructions. Arthritis of knee, degenerative Bilateral CSI today Refilled pain medicine Follow up 3 months, sooner wit problems.discussed weight gain

## 2019-04-21 NOTE — Assessment & Plan Note (Signed)
Bilateral CSI today Refilled pain medicine Follow up 3 months, sooner wit problems.discussed weight gain

## 2019-05-02 ENCOUNTER — Ambulatory Visit (INDEPENDENT_AMBULATORY_CARE_PROVIDER_SITE_OTHER): Payer: BC Managed Care – PPO | Admitting: Family Medicine

## 2019-05-02 DIAGNOSIS — E119 Type 2 diabetes mellitus without complications: Secondary | ICD-10-CM | POA: Diagnosis not present

## 2019-05-02 NOTE — Patient Instructions (Addendum)
Goals:  1. Balance meals to contain:     - No more than one portion of carbohydrate per meal and 80 grams/day.      - At least 75 g protein per day.   Each week, average your daily grams of protein and grams of carbohydrate, and record this on right margin of Goals Sheet.   2. Include vegetables at both lunch and dinner. Keep frozen veg's on hand for a quick veg option.  3. Exercise at least 15 minutes 6 X wk and get an average of 5000 steps per day.   Complete Goals Sheet, which will be mailed to you today.    Net carbs = Total Carbohydrate on food label minus grams of fiber.    Follow-up via [place] on Monday, Nov 23 at 11 AM.

## 2019-05-02 NOTE — Progress Notes (Signed)
Telehealth Encounter PCP Shirley Martinique, DO  I connected with Valerie Carter (MRN [id number]) on 05/02/2019 by MyChart video-enabled, HIPAA-compliant telemedicine application, verified that I am speaking with the correct person using two identifiers, and that the patient was in a private environment conducive to confidentiality.  The patient agreed to proceed.  Provider was Kennith Center, PhD, RD, LDN, CEDRD Provider was located at [facility] during this telehealth encounter; patient was at home  Appt start time: 1400 end time: 1500 (1 hour)  Reason for telehealth visit: Medical Nutrition Therapy related to weight gain post-bariatric surgery (E66.01, Obesity; BMI >49).    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  Weight loss proceeded as expected through October 2019, but has fluctuated since then from 273 lb to most recent self-reported weight of 302 lb.  Valerie Carter has chronic knee pain, but her surgeon has required that she weigh no more than 230 lb before he will do knee replacement. This is a big motivator for her.  Assessment:  Valerie Carter is still unemployed, since school is not in-person yet.  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   FBG have been 85-105 most days.   Weight:  Has not weighed since 04/08/19, but thinks wt is going in the right direction.    PT referral: Has not yet called for appt; does not currently have any income, so cannot incur the expense.    Recent eating pattern: 3 meals & 1 snack most days. Has limited carb foods to one portion per meal.  Has continued using a food tracker app, which suggests she is getting an average of 70-80 g CHO/day and only 60-65 g protein daily.  She has also been documenting progress on Goals Sheet.   Recent physical activity: Walking 15-20 min 2-3 X wk.   Chair exercising ~10 min 3 X wk and using elliptical ~30 min daily 5 X wk.  Also using some bands for upper  body resistance ex 3-4 X wk. Sleep: Estimates she gets ~6 hrs/night.  Not napping lately.    24-hr recall:  (Up at 8:30 AM) B (9:15 AM)-  1 1/2 eggs, 1 sausage patty, water Snk ( AM)-  --- L (1 PM)-  1 grilled chx salad (1/2 chx brst), 1 tbsp, 2 tbsp pecans&dried cranberries&pumpkin seeds, 1 keto mint (~110 kcal), water Snk ( PM)-  --- D (6 PM)-  2 c homemade chx pot pie Snk (8 PM)-  water Typical day? Yes.    Intervention: Reviewed progress on goals set on 10/12, congratulated patient on good progress, and confirmed same goals, with slight modification to exercise goal.    For recommendations and goals, see Patient Instructions.    Follow-up: 3 weeks via MyChart video platform.    Valerie Carter,Valerie Carter

## 2019-05-11 ENCOUNTER — Other Ambulatory Visit: Payer: Self-pay | Admitting: Family Medicine

## 2019-05-11 DIAGNOSIS — D509 Iron deficiency anemia, unspecified: Secondary | ICD-10-CM

## 2019-05-17 ENCOUNTER — Telehealth: Payer: Self-pay

## 2019-05-18 NOTE — Telephone Encounter (Signed)
Linward Natal I tried calling her. I "sort of" know what needs to go in her letter but it would be best if I could get the specifics: Who does it go to? Or is there a form? Is this the one where I discuss her dosage of meds/ Her mailbox is full but if she calls back, this is the info I need. THANKS! [name]

## 2019-05-21 ENCOUNTER — Ambulatory Visit (INDEPENDENT_AMBULATORY_CARE_PROVIDER_SITE_OTHER): Payer: BC Managed Care – PPO | Admitting: Family Medicine

## 2019-05-21 ENCOUNTER — Other Ambulatory Visit: Payer: Self-pay

## 2019-05-21 DIAGNOSIS — E119 Type 2 diabetes mellitus without complications: Secondary | ICD-10-CM

## 2019-05-21 NOTE — Patient Instructions (Addendum)
-   When you notice your rings fitting looser, weigh yourself the next morning.    - Before your follow-up appointment, check to see what your average protein and carb intakes from the previous week.  You may want to record the daily numbers on your Goals Sheet, which will make it easier to keep up with.    - Insulin works better earlier in the day, so it's best to get more of the day's carbohydrate earlier in the day, with a smaller proportion of the day's carbohydrate late in the day.    - On your new schedule, if lunch consists of just yogurt, be sure to add an afternoon snack of fruit or vegetable.  Better yet, aim for a lunch time of 12 PM, which should give you enough time to eat a real lunch, including vegetables and some protein.    Goals: 1. Limit daily carb to 80 grams and get at least 75 g protein each day, with no more than one starchy food portion per meal.   2. Get vegetables at least twice a day.   3. Exercise at least 30 minutes 6 days a week.    PLAN AHEAD:  - When grocery shopping, be sure to get some vegetables that can be part of lunch, e.g., bell peppers, snap peas, carrots, edamame, celery, cherry or grape tomatoes, cucumber.   - Also plan for tomorrow's meals and exercise times.    Follow-up appt: Tuesday, December 15 at 4 PM via [place].

## 2019-05-21 NOTE — Progress Notes (Signed)
Telehealth Encounter PCP Shirley Martinique, DO  I connected with Yicel Sherlin (MRN [id number]) on 05/21/2019 by telephone, verified that I am speaking with the correct person using two identifiers, and that the patient was in a private environment conducive to confidentiality.  The patient agreed to proceed.  Provider was Kennith Center, PhD, RD, LDN, CEDRD Provider was located at home during this telehealth encounter; patient was at home  Appt start time: 1100 end time: 1200 (1 hour)  Reason for telehealth visit: Medical Nutrition Therapy related to weight gain post-bariatric surgery (E66.01, Obesity; BMI >49).    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  Weight loss proceeded as expected through October 2019, but has fluctuated since then from 273 lb to >300 lb.  Betzi has chronic knee pain, but her surgeon has required that she weigh no more than 230 lb before he will do knee replacement. This is a big motivator for her.  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 FBG:  Has not checked recently; no strips left.     Weight:  321 lb on 05-20-19, which was distressing to her; feels she has been making good choices, and limiting intake consistently.  She will measure again when rings fit more loosely.   Recent eating pattern: 3 meals & 1 snack most days.  Still limiting carb foods to one portion per meal.  She has been documenting progress on Goals Sheet.   Recent physical activity:  Chair exercising ~15 min 6 X wk and using elliptical ~30 min daily 5 X wk.  Also using some bands for upper body resistance ex 3-4 X wk.  Shoulder has been hurting, so doing no weights.  Has had to cut back on walking b/c of severe knee pain.   Sleep: Estimates she gets ~6 hrs/night.    24-hr recall suggests intake of <500 kcal:  (Up at 7 AM) B (7:30 AM)-  1 c coffee, 2 tbsp creamer (10 g carb, 70 kcal), protein shake (20  g pro, 100 kcal) in ~6 oz alm milk  (10 g carb, 60 kcal)      230 Snk ( AM)-   L (1 PM)-  5 oz [place] Grk yogurt (15 g pro, 5 g carb, 100 kcal)   100 Snk ( PM)-  Water  - Napped 1:30 to 3:30 PM -  D (5:30 PM)-  1/2 c cabbage, 1/2 c carrots, 1 chx wing   150 Snk (8 PM)-  water Typical day? Yes.  for a weekend.  On weekdays, bkfst is coffee & pro shake; lunch is yogurt & water (on bus); dinner is a veg, a carb food, and protein food.    Intervention: Reviewed progress on goals set on 11/4, discussed weight gain despite good efforts, which I suggested may be reflective of fluid retention.  Also recommended stricter adherence to obtaining veg's and adequate protein, and carb restriction.  Closer monitoring of food tracker data for carb and protein may help in this.    For recommendations and goals, see Patient Instructions.    Follow-up: 3 weeks via MyChart video platform.    SYKES,JEANNIE

## 2019-05-22 ENCOUNTER — Telehealth: Payer: Self-pay | Admitting: *Deleted

## 2019-05-22 DIAGNOSIS — M170 Bilateral primary osteoarthritis of knee: Secondary | ICD-10-CM

## 2019-05-22 MED ORDER — HYDROCODONE-ACETAMINOPHEN 10-325 MG PO TABS: ORAL_TABLET | ORAL | 0 refills | Status: DC

## 2019-05-22 NOTE — Telephone Encounter (Signed)
I created the letter the patient needed for the [employer] and she will pick up the letter tomorrow. Will route this message to Dr Nori Riis so that the patient can get a new Rx for Norco 10/325 one pill daily #30

## 2019-06-01 DIAGNOSIS — Z20828 Contact with and (suspected) exposure to other viral communicable diseases: Secondary | ICD-10-CM | POA: Diagnosis not present

## 2019-06-12 ENCOUNTER — Other Ambulatory Visit: Payer: Self-pay

## 2019-06-12 ENCOUNTER — Ambulatory Visit (INDEPENDENT_AMBULATORY_CARE_PROVIDER_SITE_OTHER): Payer: BC Managed Care – PPO | Admitting: Family Medicine

## 2019-06-12 DIAGNOSIS — Z9884 Bariatric surgery status: Secondary | ICD-10-CM

## 2019-06-12 DIAGNOSIS — Z6841 Body Mass Index (BMI) 40.0 and over, adult: Secondary | ICD-10-CM

## 2019-06-12 DIAGNOSIS — E119 Type 2 diabetes mellitus without complications: Secondary | ICD-10-CM

## 2019-06-12 DIAGNOSIS — Z713 Dietary counseling and surveillance: Secondary | ICD-10-CM

## 2019-06-12 NOTE — Progress Notes (Signed)
Telehealth Encounter PCP Shirley Martinique, DO  I connected with Valerie Carter (MRN [id number]) on 06/12/2019 by telephone, verified that I am speaking with the correct person using two identifiers, and that the patient was in a private environment conducive to confidentiality.  The patient agreed to proceed.  Provider was Valerie Center, PhD, RD, LDN, CEDRD Provider was located at [facility] during this telehealth encounter; patient was at home  Appt start time: 1100 end time: 1200 (1 hour)  Reason for telehealth visit: Medical Nutrition Therapy related to weight gain post-bariatric surgery (E66.01, Obesity; BMI >50).    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  Weight loss proceeded as expected through October 2019, but has fluctuated since then from 273 lb to >300 lb.  Valerie Carter has chronic knee pain, but her surgeon has required that she weigh no more than 230 lb before he will do knee replacement. This is a big motivator for her.  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  Average daily intakes: 64-70 g protein and 65-70 g carb.  FBG:  Not reported.  Weight:  Has not re-weighed.  Recent eating pattern: 3 meals & 1 snack most days.  Still limiting carb foods to one portion per meal.  Has been taking celery or fruit for snacks.  Continuing to document progress on Goals Sheet.   Recent physical activity:  Pedaling for 15 min 2 X day.  Also doing chair exercises ~15 min 6 X wk and using elliptical ~30 min daily 5 X wk.  Also using some bands for upper body resistance ex 3-4 X wk.   Sleep: Estimates ~6 hrs/night.    24-hr recall:  (Up at 4:45 AM) B (5:30 AM)-  1 pro shake (20 g pro, 120 kcal), green tea Snk ( AM)-  water L (11 PM)-  3 oz [place] salad (mayo, must, pep, on, celery, relish), 1 sklc bread  Snk ( PM)-  --- D ( PM)-  2 baked chx wings, 6 asparagus Snk  ( PM)-  1/2 c sug-free choc pudding Typical day? Yes.    Intervention: Reviewed progress on goals set on 11/23, congratulated [place] on excellent efforts, and encouraged continuing with current goals and tracking.    For recommendations and goals, see Patient Instructions.    Follow-up: 3 weeks via MyChart video platform.    Valerie Carter,JEANNIE

## 2019-06-12 NOTE — Patient Instructions (Addendum)
Goals remain the same:  1. Limit daily carb to 80 grams and get at least 75 g protein each day, with no more than one starchy food portion per meal.   2. Get vegetables at least twice a day.   3. Exercise at least 30 minutes 6 days a week.    - Measure protein and carb foods to be sure you are getting the right amounts.   - Go back over each week's intake, and calculate the average daily amounts of both protein and carb for the week.    Follow-up appt via MyChart on Monday, December 28 at 11 AM.

## 2019-06-25 ENCOUNTER — Other Ambulatory Visit: Payer: Self-pay

## 2019-06-25 ENCOUNTER — Telehealth: Payer: Self-pay

## 2019-06-25 ENCOUNTER — Telehealth: Payer: BC Managed Care – PPO | Admitting: Family Medicine

## 2019-06-25 DIAGNOSIS — M170 Bilateral primary osteoarthritis of knee: Secondary | ICD-10-CM

## 2019-07-03 MED ORDER — HYDROCODONE-ACETAMINOPHEN 10-325 MG PO TABS: ORAL_TABLET | ORAL | 0 refills | Status: DC

## 2019-07-03 NOTE — Telephone Encounter (Signed)
refil

## 2019-07-23 ENCOUNTER — Other Ambulatory Visit: Payer: Self-pay | Admitting: Family Medicine

## 2019-08-02 ENCOUNTER — Telehealth: Payer: Self-pay

## 2019-08-02 DIAGNOSIS — M170 Bilateral primary osteoarthritis of knee: Secondary | ICD-10-CM

## 2019-08-03 ENCOUNTER — Encounter: Payer: Self-pay | Admitting: Sports Medicine

## 2019-08-03 ENCOUNTER — Other Ambulatory Visit: Payer: Self-pay

## 2019-08-03 ENCOUNTER — Ambulatory Visit (INDEPENDENT_AMBULATORY_CARE_PROVIDER_SITE_OTHER): Payer: BC Managed Care – PPO | Admitting: Sports Medicine

## 2019-08-03 VITALS — BP 155/73 | Ht 65.0 in | Wt 301.0 lb

## 2019-08-03 DIAGNOSIS — M170 Bilateral primary osteoarthritis of knee: Secondary | ICD-10-CM

## 2019-08-03 MED ORDER — METHYLPREDNISOLONE ACETATE 40 MG/ML IJ SUSP
40.0000 mg | Freq: Once | INTRAMUSCULAR | Status: AC
  Administered 2019-08-03: 40 mg via INTRA_ARTICULAR

## 2019-08-03 NOTE — Patient Instructions (Signed)
The steroid injections given to you today will take several days for it to have an effect.  If your knees are more sore tonight you may use ice on them -We can do the steroid injections every 3 months for you -Continue to work on the strengthening exercises shown to you at today's visit.  Avoid doing squats and lunges as this will likely aggravate your pain  We will see you back as needed

## 2019-08-03 NOTE — Progress Notes (Addendum)
PCP: Shirley, Martinique, DO  Subjective:   HPI: Patient is a 59 y.o. female here for bilateral knee injections.  Patient has known osteoarthritis that she has been getting intermittent corticosteroid injections for in the past.  Patient notes the last set of injections last for about a month.  She has had viscosupplementation injections before which do not provide her any benefit at all.  Patient is also been trying to do some quad strengthening exercises but notes she has been doing this mostly form of squats and lunges.  She notes this has been aggravating her pain.  Pain is located mostly along her lateral joint line.  X-rays in 2019 show moderate severe degenerative changes of her bilateral knees mostly in the lateral compartment of the knee.   Review of Systems: See HPI above.  Past Medical History:  Diagnosis Date  . Back pain   . Carpal tunnel syndrome    while driving both hands  . Diabetes mellitus without complication ([place])    type 2  . DJD (degenerative joint disease)    both knees right worse than left  . Headache   . Hyperlipidemia   . Hypertension   . Intramural leiomyoma of uterus   . Knee pain   . Morbid obesity with BMI of 50.0-59.9, adult ([place])   . Vitamin D deficiency     Current Outpatient Medications on File Prior to Visit  Medication Sig Dispense Refill  . Acetaminophen (TYLENOL ARTHRITIS EXT RELIEF PO) Take by mouth daily.    Marland Kitchen amLODipine (NORVASC) 2.5 MG tablet Take 1 tablet by mouth once daily 90 tablet 0  . Ascorbic Acid (VITAMIN C PO) Take 1 tablet by mouth daily.    . Blood Glucose Monitoring Suppl (ONE TOUCH ULTRA SYSTEM KIT) W/DEVICE KIT 1 kit by Does not apply route once. 1 each 0  . Calcium Carbonate-Vit D-Min (CALCIUM 1200 PO) Take 500 mg by mouth 3 (three) times daily.     . diclofenac sodium (VOLTAREN) 1 % GEL Apply 2 g topically 4 (four) times daily. (Patient not taking: Reported on 05/21/2019) 1 Tube 12  . docusate sodium (COLACE) 250 MG capsule  Take 1 capsule (250 mg total) by mouth daily. 10 capsule 0  . Ferrous Sulfate (IRON) 325 (65 Fe) MG TABS Take 1 tablet by mouth once daily with breakfast 30 tablet 0  . glucose blood test strip Use as instructed 100 each 12  . HYDROcodone-acetaminophen (NORCO) 10-325 MG tablet Take1 tablet by mouth daily as needed for chronic knee pain 30 tablet 0  . Multiple Vitamin (MULTIVITAMIN WITH MINERALS) TABS tablet Take 1 tablet by mouth 2 (two) times daily.    . OMEGA-3 FATTY ACIDS PO Take 1 capsule by mouth daily.     . polyethylene glycol powder (GLYCOLAX/MIRALAX) powder Take 17 g by mouth daily. (Patient not taking: Reported on 05/21/2019) 250 g 3   No current facility-administered medications on file prior to visit.    Past Surgical History:  Procedure Laterality Date  . KNEE SURGERY Right 1999   torn cartlidge repair  . LAPAROSCOPIC GASTRIC SLEEVE RESECTION N/A 06/07/2017   Procedure: LAPAROSCOPIC GASTRIC SLEEVE RESECTION, UPPER ENDOSCOPY;  Surgeon: Kieth Brightly, Arta Bruce, MD;  Location: [facility];  Service: General;  Laterality: N/A;    No Known Allergies  Social History   Socioeconomic History  . Marital status: Legally Separated    Spouse name: Not on file  . Number of children: 1  . Years of education: Not on  file  . Highest education level: Not on file  Occupational History  . Not on file  Tobacco Use  . Smoking status: Never Smoker  . Smokeless tobacco: Never Used  Substance and Sexual Activity  . Alcohol use: No    Alcohol/week: 0.0 standard drinks  . Drug use: No  . Sexual activity: Never  Other Topics Concern  . Not on file  Social History Narrative  . Not on file   Social Determinants of Health   Financial Resource Strain:   . Difficulty of Paying Living Expenses: Not on file  Food Insecurity:   . Worried About Charity fundraiser in the Last Year: Not on file  . Ran Out of Food in the Last Year: Not on file  Transportation Needs:   . Lack of Transportation  (Medical): Not on file  . Lack of Transportation (Non-Medical): Not on file  Physical Activity:   . Days of Exercise per Week: Not on file  . Minutes of Exercise per Session: Not on file  Stress:   . Feeling of Stress : Not on file  Social Connections:   . Frequency of Communication with Friends and Family: Not on file  . Frequency of Social Gatherings with Friends and Family: Not on file  . Attends Religious Services: Not on file  . Active Member of Clubs or Organizations: Not on file  . Attends Archivist Meetings: Not on file  . Marital Status: Not on file  Intimate Partner Violence:   . Fear of Current or Ex-Partner: Not on file  . Emotionally Abused: Not on file  . Physically Abused: Not on file  . Sexually Abused: Not on file    Family History  Problem Relation Age of Onset  . Diabetes Sister   . Hypertension Sister   . Diabetes Maternal Aunt   . Breast cancer Maternal Aunt 29  . Diabetes Father         Objective:  Physical Exam: BP (!) 155/73   Ht '5\' 5"'  (1.651 m)   Wt (!) 301 lb (136.5 kg)   BMI 50.09 kg/m  Gen: NAD, comfortable in exam room Lungs: Breathing comfortably on room air Knee Exam Bilateral -Inspection: no deformity, no discoloration -Palpation: Lateral joint line tenderness -ROM: Extension: 0 degrees; Flexion: 100 degrees -Strength: Extension: 5/5; Flexion: 5/5 -Special Tests: Varus Stress: Negative; Valgus Stress: Negative;  -Limb neurovascularly intact   Assessment & Plan:  Patient is a 59 y.o. female here for bilateral knee osteoarthritis  1.  Bilateral knee osteoarthritis -Consent was obtained for corticosteroid injection of her bilateral knees.  Timeout was performed prior to the procedure.  40 mg of Depo-Medrol and 3 cc of lidocaine were injected into her bilateral knees using sterile technique.  Patient tolerated the injections well there are no complications. -Home strengthening program was reviewed for patient.  She was  advised not to do deep squats or lunges -Patient may take over-the-counter anti-inflammatories as needed for pain  Patient will follow up on an as-needed basis  I was the preceptor for this visit and available for immediate consultation Shellia Cleverly, DO

## 2019-08-03 NOTE — Addendum Note (Signed)
Addended by: Jolinda Croak E on: 08/03/2019 09:54 AM   Modules accepted: Orders

## 2019-08-07 MED ORDER — HYDROCODONE-ACETAMINOPHEN 10-325 MG PO TABS: ORAL_TABLET | ORAL | 0 refills | Status: DC

## 2019-08-07 NOTE — Telephone Encounter (Signed)
refilled 

## 2019-09-03 ENCOUNTER — Telehealth: Payer: Self-pay

## 2019-09-03 DIAGNOSIS — M170 Bilateral primary osteoarthritis of knee: Secondary | ICD-10-CM

## 2019-09-03 MED ORDER — HYDROCODONE-ACETAMINOPHEN 10-325 MG PO TABS: ORAL_TABLET | ORAL | 0 refills | Status: DC

## 2019-09-03 NOTE — Telephone Encounter (Signed)
done

## 2019-10-01 ENCOUNTER — Telehealth: Payer: Self-pay | Admitting: *Deleted

## 2019-10-01 DIAGNOSIS — M170 Bilateral primary osteoarthritis of knee: Secondary | ICD-10-CM

## 2019-10-01 MED ORDER — HYDROCODONE-ACETAMINOPHEN 10-325 MG PO TABS: ORAL_TABLET | ORAL | 0 refills | Status: DC

## 2019-10-01 NOTE — Telephone Encounter (Signed)
done

## 2019-11-07 ENCOUNTER — Telehealth: Payer: Self-pay

## 2019-11-07 DIAGNOSIS — M170 Bilateral primary osteoarthritis of knee: Secondary | ICD-10-CM

## 2019-11-07 MED ORDER — HYDROCODONE-ACETAMINOPHEN 10-325 MG PO TABS: ORAL_TABLET | ORAL | 0 refills | Status: DC

## 2019-11-07 NOTE — Telephone Encounter (Signed)
refilled 

## 2019-11-30 ENCOUNTER — Other Ambulatory Visit: Payer: Self-pay

## 2019-11-30 ENCOUNTER — Ambulatory Visit (INDEPENDENT_AMBULATORY_CARE_PROVIDER_SITE_OTHER): Payer: BC Managed Care – PPO | Admitting: Family Medicine

## 2019-11-30 DIAGNOSIS — M170 Bilateral primary osteoarthritis of knee: Secondary | ICD-10-CM

## 2019-11-30 DIAGNOSIS — I10 Essential (primary) hypertension: Secondary | ICD-10-CM

## 2019-11-30 MED ORDER — METHYLPREDNISOLONE ACETATE 40 MG/ML IJ SUSP
40.0000 mg | Freq: Once | INTRAMUSCULAR | Status: AC
  Administered 2019-11-30: 40 mg via INTRA_ARTICULAR

## 2019-11-30 MED ORDER — HYDROCODONE-ACETAMINOPHEN 10-325 MG PO TABS: ORAL_TABLET | ORAL | 0 refills | Status: DC

## 2019-11-30 NOTE — Progress Notes (Signed)
  Valerie Carter - 59 y.o. 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 *B knees with medial joint line tenderness. Full ROM but pain with full extension L>R. No effusion noted.ligamentously intact + patellar grind B  Calves are soft   PROCEDURE: INJECTION: Patient was given informed consent, signed copy in the chart. Appropriate time out was taken. Area prepped and draped in usual sterile fashion. Ethyl chloride was  used for local anesthesia. A 21 gauge 1 1/2 inch needle was used.. 1 cc of methylprednisolone 40 mg/ml plus  4 cc of 1% lidocaine without epinephrine was injected into the Bilateral knees using a(n) medial approach.   The patient tolerated the procedure well. There were no complications. Post procedure instructions were given.  ASSESSMENT & PLAN:  See problem based charting & AVS for pt instructions. Arthritis of knee, degenerative Bilateral CSI Encouraged weigt loss  Discussed surgery  HYPERTENSION, BENIGN ESSENTIAL She needs to get back with her PCP re her BP as elevated last few times and today. She is currently not taking BP med

## 2019-11-30 NOTE — Assessment & Plan Note (Signed)
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

## 2019-11-30 NOTE — Assessment & Plan Note (Addendum)
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

## 2019-11-30 NOTE — Assessment & Plan Note (Signed)
She needs to get back with her PCP re her BP as elevated last few times and today. She is currently not taking BP med

## 2019-12-07 ENCOUNTER — Ambulatory Visit: Payer: BC Managed Care – PPO | Admitting: Family Medicine

## 2019-12-28 ENCOUNTER — Other Ambulatory Visit: Payer: Self-pay | Admitting: Family Medicine

## 2019-12-28 DIAGNOSIS — M170 Bilateral primary osteoarthritis of knee: Secondary | ICD-10-CM

## 2019-12-28 MED ORDER — HYDROCODONE-ACETAMINOPHEN 10-325 MG PO TABS: ORAL_TABLET | ORAL | 0 refills | Status: DC

## 2019-12-28 NOTE — Telephone Encounter (Signed)
Patient called request refill on :   HYDROcodone-acetaminophen ([place]) 10-325 MG tablet [[id number]]   Order Details Dose, Route, Frequency: As Directed  Dispense Quantity: 120 tablet Refills: 0   Note to Pharmacy: Ok to fill 30 days after last rx      Sig: Take1 tablet by mouth four times a day as needed for chronic    --Forwarding request med asst that if approved send refill order to :   [facility] [address] [place]  [place], [place] [address]  Phone:  [phone] Fax:  [phone]    --Dion Body

## 2020-01-02 ENCOUNTER — Encounter (HOSPITAL_COMMUNITY): Payer: Self-pay

## 2020-01-25 ENCOUNTER — Telehealth: Payer: Self-pay

## 2020-01-25 DIAGNOSIS — M170 Bilateral primary osteoarthritis of knee: Secondary | ICD-10-CM

## 2020-01-25 NOTE — Telephone Encounter (Signed)
-----   Message from Carolyne Littles sent at 01/25/2020  9:04 AM EDT ----- Regarding: refill request Pt is asking for a refill on hydrocodone . Same pharmacy. She is aware Dr. Nori Riis is out of the office, she has 5 days of medication left.

## 2020-01-29 MED ORDER — HYDROCODONE-ACETAMINOPHEN 10-325 MG PO TABS: ORAL_TABLET | ORAL | 0 refills | Status: DC

## 2020-02-26 ENCOUNTER — Telehealth: Payer: Self-pay

## 2020-02-26 DIAGNOSIS — M170 Bilateral primary osteoarthritis of knee: Secondary | ICD-10-CM

## 2020-02-26 NOTE — Telephone Encounter (Signed)
-----   Message from Valerie Carter sent at 02/26/2020  3:10 PM EDT ----- Regarding: phone message Pt is asking for a refill on hydrocodone.

## 2020-02-27 MED ORDER — HYDROCODONE-ACETAMINOPHEN 10-325 MG PO TABS: ORAL_TABLET | ORAL | 0 refills | Status: DC

## 2020-03-04 ENCOUNTER — Ambulatory Visit (INDEPENDENT_AMBULATORY_CARE_PROVIDER_SITE_OTHER): Payer: BC Managed Care – PPO

## 2020-03-04 ENCOUNTER — Other Ambulatory Visit: Payer: Self-pay

## 2020-03-04 ENCOUNTER — Encounter (HOSPITAL_COMMUNITY): Payer: Self-pay

## 2020-03-04 ENCOUNTER — Ambulatory Visit (HOSPITAL_COMMUNITY)
Admission: RE | Admit: 2020-03-04 | Discharge: 2020-03-04 | Disposition: A | Discharge status: Home / Self Care | Payer: BC Managed Care – PPO | Source: Ambulatory Visit | Attending: Urgent Care | Admitting: Urgent Care

## 2020-03-04 VITALS — BP 163/78 | HR 85 | Temp 98.4°F | Resp 18 | Ht 64.5 in | Wt 328.0 lb

## 2020-03-04 DIAGNOSIS — M79671 Pain in right foot: Secondary | ICD-10-CM | POA: Diagnosis not present

## 2020-03-04 DIAGNOSIS — S99921A Unspecified injury of right foot, initial encounter: Secondary | ICD-10-CM | POA: Diagnosis not present

## 2020-03-04 DIAGNOSIS — M7731 Calcaneal spur, right foot: Secondary | ICD-10-CM | POA: Diagnosis not present

## 2020-03-04 DIAGNOSIS — M19071 Primary osteoarthritis, right ankle and foot: Secondary | ICD-10-CM

## 2020-03-04 DIAGNOSIS — M7989 Other specified soft tissue disorders: Secondary | ICD-10-CM | POA: Diagnosis not present

## 2020-03-04 DIAGNOSIS — M25571 Pain in right ankle and joints of right foot: Secondary | ICD-10-CM

## 2020-03-04 DIAGNOSIS — M25474 Effusion, right foot: Secondary | ICD-10-CM | POA: Diagnosis not present

## 2020-03-04 MED ORDER — PREDNISONE 20 MG PO TABS
ORAL_TABLET | ORAL | 0 refills | Status: DC
Start: 2020-03-04 — End: 2020-08-19

## 2020-03-04 NOTE — ED Provider Notes (Signed)
[place]   MRN: [id number] DOB: January 13, 1961  Subjective:   Valerie Carter is a 59 y.o. female presenting for 1 day history of acute onset right foot pain with swelling for the past 3 to 4 days.  Patient states that the symptoms started randomly when she was at the grocery store, stopped to grab a cart and felt a pop in the middle of her foot.  Patient states that she knows she has a history of arthritis in both knees but has never been evaluated for this in her foot.  Denies trauma otherwise, bruising, redness, warmth.  Denies history of gout.  No current facility-administered medications for this encounter.  Current Outpatient Medications:  .  Acetaminophen (TYLENOL ARTHRITIS EXT RELIEF PO), Take by mouth daily., Disp: , Rfl:  .  amLODipine (NORVASC) 2.5 MG tablet, Take 1 tablet by mouth once daily, Disp: 90 tablet, Rfl: 0 .  Ascorbic Acid (VITAMIN C PO), Take 1 tablet by mouth daily., Disp: , Rfl:  .  Blood Glucose Monitoring Suppl (ONE TOUCH ULTRA SYSTEM KIT) W/DEVICE KIT, 1 kit by Does not apply route once., Disp: 1 each, Rfl: 0 .  Calcium Carbonate-Vit D-Min (CALCIUM 1200 PO), Take 500 mg by mouth 3 (three) times daily. , Disp: , Rfl:  .  docusate sodium (COLACE) 250 MG capsule, Take 1 capsule (250 mg total) by mouth daily., Disp: 10 capsule, Rfl: 0 .  Ferrous Sulfate (IRON) 325 (65 Fe) MG TABS, Take 1 tablet by mouth once daily with breakfast, Disp: 30 tablet, Rfl: 0 .  glucose blood test strip, Use as instructed, Disp: 100 each, Rfl: 12 .  HYDROcodone-acetaminophen (NORCO) 10-325 MG tablet, Take1 tablet by mouth four times a day as needed for chronic knee pain, Disp: 120 tablet, Rfl: 0 .  Multiple Vitamin (MULTIVITAMIN WITH MINERALS) TABS tablet, Take 1 tablet by mouth 2 (two) times daily., Disp: , Rfl:  .  OMEGA-3 FATTY ACIDS PO, Take 1 capsule by mouth daily. , Disp: , Rfl:    No Known Allergies  Past Medical History:  Diagnosis Date  . Back pain   . Carpal  tunnel syndrome    while driving both hands  . Diabetes mellitus without complication ([place])    type 2  . DJD (degenerative joint disease)    both knees right worse than left  . Headache   . Hyperlipidemia   . Hypertension   . Intramural leiomyoma of uterus   . Knee pain   . Morbid obesity with BMI of 50.0-59.9, adult ([place])   . Vitamin D deficiency      Past Surgical History:  Procedure Laterality Date  . KNEE SURGERY Right 1999   torn cartlidge repair  . LAPAROSCOPIC GASTRIC SLEEVE RESECTION N/A 06/07/2017   Procedure: LAPAROSCOPIC GASTRIC SLEEVE RESECTION, UPPER ENDOSCOPY;  Surgeon: Kieth Brightly, Arta Bruce, MD;  Location: [facility];  Service: General;  Laterality: N/A;    Family History  Problem Relation Age of Onset  . Diabetes Sister   . Hypertension Sister   . Diabetes Maternal Aunt   . Breast cancer Maternal Aunt 29  . Diabetes Father     Social History   Tobacco Use  . Smoking status: Never Smoker  . Smokeless tobacco: Never Used  Vaping Use  . Vaping Use: Never used  Substance Use Topics  . Alcohol use: No    Alcohol/week: 0.0 standard drinks  . Drug use: No    ROS   Objective:   Vitals: BP Marland Kitchen)  163/78 (BP Location: Right Arm)   Pulse 85   Temp 98.4 F (36.9 C) (Oral)   Resp 18   Ht 5' 4.5" (1.638 m)   Wt (!) 328 lb (148.8 kg)   SpO2 99%   BMI 55.43 kg/m   Physical Exam Constitutional:      General: She is not in acute distress.    Appearance: Normal appearance. She is well-developed. She is obese. She is not ill-appearing, toxic-appearing or diaphoretic.  HENT:     Head: Normocephalic and atraumatic.     Nose: Nose normal.     Mouth/Throat:     Mouth: Mucous membranes are moist.     Pharynx: Oropharynx is clear.  Eyes:     General: No scleral icterus.    Extraocular Movements: Extraocular movements intact.     Pupils: Pupils are equal, round, and reactive to light.  Cardiovascular:     Rate and Rhythm: Normal rate.  Pulmonary:      Effort: Pulmonary effort is normal.  Musculoskeletal:     Right foot: Decreased range of motion. Normal capillary refill. Swelling, tenderness and bony tenderness present. No laceration or crepitus.       Legs:  Skin:    General: Skin is warm and dry.  Neurological:     General: No focal deficit present.     Mental Status: She is alert and oriented to person, place, and time.  Psychiatric:        Mood and Affect: Mood normal.        Behavior: Behavior normal.        Thought Content: Thought content normal.        Judgment: Judgment normal.     DG Foot Complete Right  Result Date: 03/04/2020 CLINICAL DATA:  Right foot pain after injury. Patient reports she felt a pop when at the grocery store and stepping to graphic cart. Progressive pain. Swelling. EXAM: RIGHT FOOT COMPLETE - 3+ VIEW COMPARISON:  None. FINDINGS: There is no evidence of fracture moderate midfoot osteoarthritis with spurring. Slight hammertoe deformity of the third and fourth digits. Plantar calcaneal spur and fragmented Achilles tendon enthesophyte. Bones are diffusely under mineralized. There is generalized soft tissue edema. Age advanced vascular calcifications. IMPRESSION: 1. No acute fracture. 2. Moderate midfoot osteoarthritis and generalized soft tissue edema. 3. Plantar calcaneal spur and fragmented Achilles tendon enthesophyte. Electronically Signed   By: Keith Rake M.D.   On: 03/04/2020 19:03     Assessment and Plan :   PDMP not reviewed this encounter.  1. Right foot pain   2. Osteoarthritis of right foot, unspecified osteoarthritis type     Patient has arthritis as seen on imaging.  Recommended prednisone course and follow-up with orthopedist.  Provide her with a note for work for rest. Counseled patient on potential for adverse effects with medications prescribed/recommended today, ER and return-to-clinic precautions discussed, patient verbalized understanding.    Jaynee Eagles, [place] 03/04/20  1924

## 2020-03-04 NOTE — ED Triage Notes (Signed)
Pt states she was at the grocery store and stepped to grab a cart and felt a pop in the middle of her foot. Pt states the pain is getting worse. PT states the pain is worse when she walks. Pt has 1+ swelling of right foot. Pt walked w/cane to exam room.

## 2020-03-07 ENCOUNTER — Ambulatory Visit: Payer: BC Managed Care – PPO | Admitting: Family Medicine

## 2020-03-25 DIAGNOSIS — Z20822 Contact with and (suspected) exposure to covid-19: Secondary | ICD-10-CM | POA: Diagnosis not present

## 2020-03-28 ENCOUNTER — Encounter: Payer: Self-pay | Admitting: Family Medicine

## 2020-03-28 ENCOUNTER — Ambulatory Visit (INDEPENDENT_AMBULATORY_CARE_PROVIDER_SITE_OTHER): Payer: BC Managed Care – PPO | Admitting: Family Medicine

## 2020-03-28 ENCOUNTER — Other Ambulatory Visit: Payer: Self-pay

## 2020-03-28 DIAGNOSIS — Z1211 Encounter for screening for malignant neoplasm of colon: Secondary | ICD-10-CM | POA: Diagnosis not present

## 2020-03-28 DIAGNOSIS — Z0000 Encounter for general adult medical examination without abnormal findings: Secondary | ICD-10-CM

## 2020-03-28 DIAGNOSIS — I10 Essential (primary) hypertension: Secondary | ICD-10-CM

## 2020-03-28 DIAGNOSIS — E119 Type 2 diabetes mellitus without complications: Secondary | ICD-10-CM

## 2020-03-28 DIAGNOSIS — E785 Hyperlipidemia, unspecified: Secondary | ICD-10-CM | POA: Diagnosis not present

## 2020-03-28 DIAGNOSIS — Z23 Encounter for immunization: Secondary | ICD-10-CM | POA: Diagnosis not present

## 2020-03-28 DIAGNOSIS — M170 Bilateral primary osteoarthritis of knee: Secondary | ICD-10-CM

## 2020-03-28 LAB — POCT GLYCOSYLATED HEMOGLOBIN (HGB A1C): HbA1c, POC (controlled diabetic range): 6 % (ref 0.0–7.0)

## 2020-03-28 MED ORDER — ZIKS ARTHRITIS PAIN RELIEF 0.025-1-12 % EX CREA: 1.0000 "application " | TOPICAL_CREAM | Freq: Two times a day (BID) | CUTANEOUS | 2 refills | Status: DC

## 2020-03-28 NOTE — Progress Notes (Signed)
    SUBJECTIVE:   CHIEF COMPLAINT / HPI: knee pain   Knee Pain  Patient reports that she has been told by her orthopedic doctor that both of her knees need to be replaced. 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 She reports that she has been taking four motrin in the AM, four in the afternoon and another four at nighttime. Tylenol does not help. She has previously tried Celebrex tried before and it irritated her GI system. Unable to use braces due to improper positioing and rolling down her legs. Prescription for Hydrocodone every 8 hours. Works from [employer] and will not take opioid medication while driving. Patient has had knee injections in the past that no longer are helpful. Patient reports that she has had a gastric sleeve procedure with weight loss but has gained while going throughout the pandemic and needs to lose close to 100lbs before she will be able to proceed with surgery for her knees. Patient is requesting another option for her knees in the meantime to help with pain. She uses a can to assist with ambulation.    PERTINENT  PMH / PSH:  HLD  HTN  Knee Degenerative arthritis  OBJECTIVE:   There were no vitals taken for this visit.   General: female appearing stated age in no acute distress Cardio: Normal S1 and S2, no S3 or S4. Rhythm is regular. No murmurs or rubs.  Bilateral radial pulses palpable Pulm: Clear to auscultation bilaterally, no crackles, wheezing, or diminished breath sounds. Normal respiratory effort, RA Extremities: tenderness in patellar region of bilateral knees. Limited extension of bilateral knee joints, no erythema, body habitus limits visualization of knee associated edema, ambulation limited requiring assistance of cane, neg anterior drawer test, some laxity of MCL  Neuro: pt alert and oriented x4   ASSESSMENT/PLAN:   Arthritis of knee, degenerative Patient planning for knee  replacements, bilaterally but will need to meet weight goal before surgeon will operate. This is challenging treatment course for patient as she has had joint injections that are no longer therapeutic, gel injections and has known severe loss of cartilage in bilateral knees. All further complicated by need for surgery but delay in timing of operation due to requirements concerning weight. Patient also requests to avoid additional opioid medication given her job requirements/safety while driving.  - encouraged patient to resume water aerobic when able to return to pool in order to help with weight loss  - prescribed capsaicin as topical for knee pain   HYPERTENSION, BENIGN ESSENTIAL Patient has not been on blood pressure medication for a while.  - CMP    Diet-controlled diabetes mellitus ([place]) A1c collected today  Diabetes improved after bariatric surgery  No medications prescribed today as A1c is no longer within DM range  Hyperlipidemia LDL goal <70 Lipid panel collected today   Healthcare maintenance Patient requests covid vaccination today  Patient agreeable to colonoscopy, referral sent to GI     Eulis Foster, MD Veteran

## 2020-03-28 NOTE — Patient Instructions (Signed)
It was a pleasure to see you today!  Thank you for choosing [facility] for your primary care.  Valerie Carter was seen for knee pain.   Our plans for today were:  Knee Pain: I have prescribed a topical to apply to your knees to help with pain. It may take a few weeks to have an affect.   I have sent a referral for GI to set up your colonoscopy.   We will collect blood work to check your cholesterol.   To keep you healthy, please keep in mind the following health maintenance items that you are due for:   1. Colonoscopy  2. Pap Smear    You should return to our clinic in 1 month for follow up for diabetes, blood pressure check and pap smear.   Best Wishes,   Dr. Alba Cory

## 2020-03-31 NOTE — Assessment & Plan Note (Signed)
Lipid panel collected today. 

## 2020-03-31 NOTE — Assessment & Plan Note (Signed)
>>  ASSESSMENT AND PLAN FOR ARTHRITIS OF KNEE, DEGENERATIVE WRITTEN ON 03/31/2020  9:02 PM BY MARCINE COYER, MD  Patient planning for knee replacements, bilaterally but will need to meet weight goal before surgeon will operate. This is challenging treatment course for patient as she has had joint injections that are no longer therapeutic, gel injections and has known severe loss of cartilage in bilateral knees. All further complicated by need for surgery but delay in timing of operation due to requirements concerning weight. Patient also requests to avoid additional opioid medication given her job requirements/safety while driving.  - encouraged patient to resume water aerobic when able to return to pool in order to help with weight loss  - prescribed capsaicin as topical for knee pain

## 2020-03-31 NOTE — Assessment & Plan Note (Signed)
Patient has not been on blood pressure medication for a while.  - CMP

## 2020-03-31 NOTE — Assessment & Plan Note (Addendum)
Patient requests covid vaccination today  Patient agreeable to colonoscopy, referral sent to GI

## 2020-03-31 NOTE — Assessment & Plan Note (Signed)
A1c collected today  Diabetes improved after bariatric surgery  No medications prescribed today as A1c is no longer within DM range

## 2020-03-31 NOTE — Assessment & Plan Note (Addendum)
Patient planning for knee replacements, bilaterally but will need to meet weight goal before surgeon will operate. This is challenging treatment course for patient as she has had joint injections that are no longer therapeutic, gel injections and has known severe loss of cartilage in bilateral knees. All further complicated by need for surgery but delay in timing of operation due to requirements concerning weight. Patient also requests to avoid additional opioid medication given her job requirements/safety while driving.  - encouraged patient to resume water aerobic when able to return to pool in order to help with weight loss  - prescribed capsaicin as topical for knee pain

## 2020-04-04 ENCOUNTER — Encounter: Payer: Self-pay | Admitting: Family Medicine

## 2020-04-04 ENCOUNTER — Ambulatory Visit (INDEPENDENT_AMBULATORY_CARE_PROVIDER_SITE_OTHER): Payer: BC Managed Care – PPO | Admitting: Family Medicine

## 2020-04-04 ENCOUNTER — Other Ambulatory Visit: Payer: Self-pay

## 2020-04-04 DIAGNOSIS — M170 Bilateral primary osteoarthritis of knee: Secondary | ICD-10-CM

## 2020-04-04 MED ORDER — HYDROCODONE-ACETAMINOPHEN 10-325 MG PO TABS: ORAL_TABLET | ORAL | 0 refills | Status: DC

## 2020-04-04 NOTE — Assessment & Plan Note (Signed)
>>  ASSESSMENT AND PLAN FOR ARTHRITIS OF KNEE, DEGENERATIVE WRITTEN ON 04/04/2020  3:57 PM BY Mariza Bourget L, MD  Last x-rays on her were significantly severe showing bone-on-bone.  I do not feel the need to repeat those at this time.  Last time we gave her corticosteroid injections she said they did not significantly help for even a week so I do not think those will help.  The only thing at this point it is going to potentially help her would be total knee replacement and that is limited currently by her elevated BMI.  I applauded her desire to go back to see the bariatric surgeon.  Not clear how much longer she will be able to do this job.  Will go back to decreased pain regimen.  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  I will see her back in the next 3 months and as needed.

## 2020-04-04 NOTE — Progress Notes (Signed)
  Valerie Carter - 59 y.o. female MRN [id number]  Date of birth: 1961-04-20    SUBJECTIVE:      Chief Complaint:/ HPI:  Bilateral knee pain.  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  They only allow her 1 pain pill in the 24 hours before she drives the bus.  She does need a refill.  She has some questions about whether or not she would qualify for disability.  The pain is really bothering her quite a bit it is 8 out of 10 most of the time.  If she takes pain pills 2-3 times a day, she can get down to 4-5 out of 10 but it is not possible while she is working.  She needs a letter for DOT services.  She has an upcoming appointment with her bariatric surgeon.  She has plateaued with her weight loss and feels somewhat defeated by that.  Realizes that her current BMI she would be unable to have total knee replacement.  Pain is significantly limiting her ability to do her ADLs as well as perform her job, go to the grocery store etc.    OBJECTIVE: BP (!) 151/76   Ht 5\' 5"  (1.651 m)   Wt (!) 330 lb (149.7 kg)   BMI 54.91 kg/m   Physical Exam:  Vital signs are reviewed. GENERAL: Well-developed overweight female no acute distress KNEES: Bilaterally tender to palpation medial joint line and on the right knee she has some tenderness at the lateral joint line as well.  Mild 1+ nonpitting edema mid shin down on both sides.  Neurovascularly intact distally in the foot bilaterally.   ASSESSMENT & PLAN:  See problem based charting & AVS for pt instructions. Arthritis of knee, degenerative Last x-rays on her were significantly severe showing bone-on-bone.  I do not feel the need to repeat those at this time.  Last time we gave her corticosteroid injections she said they did not significantly help for even a week so I do not think those will help.  The only thing at this point it is going to potentially help her would be total knee replacement and that is limited  currently by her elevated BMI.  I applauded her desire to go back to see the bariatric surgeon.  Not clear how much longer she will be able to do this job.  Will go back to decreased pain regimen.  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  I will see her back in the next 3 months and as needed.

## 2020-04-04 NOTE — Assessment & Plan Note (Signed)
Last x-rays on her were significantly severe showing bone-on-bone.  I do not feel the need to repeat those at this time.  Last time we gave her corticosteroid injections she said they did not significantly help for even a week so I do not think those will help.  The only thing at this point it is going to potentially help her would be total knee replacement and that is limited currently by her elevated BMI.  I applauded her desire to go back to see the bariatric surgeon.  Not clear how much longer she will be able to do this job.  Will go back to decreased pain regimen.  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  I will see her back in the next 3 months and as needed.

## 2020-04-18 ENCOUNTER — Ambulatory Visit: Payer: BC Managed Care – PPO

## 2020-04-21 ENCOUNTER — Other Ambulatory Visit: Payer: Self-pay

## 2020-04-21 ENCOUNTER — Ambulatory Visit (INDEPENDENT_AMBULATORY_CARE_PROVIDER_SITE_OTHER): Payer: BC Managed Care – PPO

## 2020-04-21 DIAGNOSIS — Z23 Encounter for immunization: Secondary | ICD-10-CM | POA: Diagnosis not present

## 2020-04-21 NOTE — Progress Notes (Signed)
   Covid-19 Vaccination Clinic  Name:  Valerie Carter    MRN: [id number] DOB: 1960/12/15  04/21/2020   Patient presents to nurse clinic for second [facility] vaccination. Patient denies previous allergic reaction to vaccine and answers no to all screening questions. Administered in LD, site unremarkable, tolerated injection well.   [name] was observed post Covid-19 immunization for 15 minutes without incident. She was provided with Vaccine Information Sheet and instruction to access the V-Safe system.   [name] was instructed to call 911 with any severe reactions post vaccine: Marland Kitchen Difficulty breathing  . Swelling of face and throat  . A fast heartbeat  . A bad rash all over body  . Dizziness and weakness    Provided patient with updated immunization record and card.   Talbot Grumbling, RN

## 2020-04-24 DIAGNOSIS — Z9884 Bariatric surgery status: Secondary | ICD-10-CM | POA: Diagnosis not present

## 2020-04-24 DIAGNOSIS — E669 Obesity, unspecified: Secondary | ICD-10-CM | POA: Diagnosis not present

## 2020-05-14 ENCOUNTER — Telehealth: Payer: Self-pay

## 2020-05-14 DIAGNOSIS — M170 Bilateral primary osteoarthritis of knee: Secondary | ICD-10-CM

## 2020-05-15 MED ORDER — HYDROCODONE-ACETAMINOPHEN 10-325 MG PO TABS: ORAL_TABLET | ORAL | 0 refills | Status: DC

## 2020-05-15 NOTE — Telephone Encounter (Signed)
done

## 2020-06-09 ENCOUNTER — Telehealth: Payer: Self-pay

## 2020-06-09 DIAGNOSIS — M170 Bilateral primary osteoarthritis of knee: Secondary | ICD-10-CM

## 2020-06-09 MED ORDER — HYDROCODONE-ACETAMINOPHEN 10-325 MG PO TABS: ORAL_TABLET | ORAL | 0 refills | Status: DC

## 2020-06-09 NOTE — Telephone Encounter (Signed)
-----   Message from Carolyne Littles sent at 06/09/2020  9:56 AM EST ----- Regarding: refill request Pt is asking for a refill on HYDROcodone-acetaminophen (NORCO) 10-325 MG tablet

## 2020-07-15 ENCOUNTER — Telehealth: Payer: Self-pay | Admitting: *Deleted

## 2020-07-15 DIAGNOSIS — M170 Bilateral primary osteoarthritis of knee: Secondary | ICD-10-CM

## 2020-07-16 MED ORDER — HYDROCODONE-ACETAMINOPHEN 10-325 MG PO TABS: ORAL_TABLET | ORAL | 0 refills | Status: DC

## 2020-07-16 NOTE — Telephone Encounter (Signed)
refill 

## 2020-08-13 ENCOUNTER — Other Ambulatory Visit: Payer: Self-pay | Admitting: Sports Medicine

## 2020-08-13 DIAGNOSIS — M170 Bilateral primary osteoarthritis of knee: Secondary | ICD-10-CM

## 2020-08-13 MED ORDER — HYDROCODONE-ACETAMINOPHEN 10-325 MG PO TABS: ORAL_TABLET | ORAL | 0 refills | Status: DC

## 2020-08-19 ENCOUNTER — Encounter (INDEPENDENT_AMBULATORY_CARE_PROVIDER_SITE_OTHER): Payer: Self-pay | Admitting: Family Medicine

## 2020-08-19 ENCOUNTER — Ambulatory Visit (INDEPENDENT_AMBULATORY_CARE_PROVIDER_SITE_OTHER): Payer: BC Managed Care – PPO | Admitting: Family Medicine

## 2020-08-19 ENCOUNTER — Other Ambulatory Visit: Payer: Self-pay

## 2020-08-19 VITALS — BP 170/92 | HR 90 | Temp 97.9°F | Ht 64.0 in | Wt 336.0 lb

## 2020-08-19 DIAGNOSIS — Z9189 Other specified personal risk factors, not elsewhere classified: Secondary | ICD-10-CM

## 2020-08-19 DIAGNOSIS — M25562 Pain in left knee: Secondary | ICD-10-CM

## 2020-08-19 DIAGNOSIS — F3289 Other specified depressive episodes: Secondary | ICD-10-CM | POA: Diagnosis not present

## 2020-08-19 DIAGNOSIS — Z9884 Bariatric surgery status: Secondary | ICD-10-CM

## 2020-08-19 DIAGNOSIS — R0602 Shortness of breath: Secondary | ICD-10-CM | POA: Diagnosis not present

## 2020-08-19 DIAGNOSIS — Z6841 Body Mass Index (BMI) 40.0 and over, adult: Secondary | ICD-10-CM

## 2020-08-19 DIAGNOSIS — E739 Lactose intolerance, unspecified: Secondary | ICD-10-CM

## 2020-08-19 DIAGNOSIS — R5383 Other fatigue: Secondary | ICD-10-CM | POA: Diagnosis not present

## 2020-08-19 DIAGNOSIS — E559 Vitamin D deficiency, unspecified: Secondary | ICD-10-CM | POA: Diagnosis not present

## 2020-08-19 DIAGNOSIS — E65 Localized adiposity: Secondary | ICD-10-CM

## 2020-08-19 DIAGNOSIS — E785 Hyperlipidemia, unspecified: Secondary | ICD-10-CM

## 2020-08-19 DIAGNOSIS — I152 Hypertension secondary to endocrine disorders: Secondary | ICD-10-CM

## 2020-08-19 DIAGNOSIS — E1159 Type 2 diabetes mellitus with other circulatory complications: Secondary | ICD-10-CM

## 2020-08-19 DIAGNOSIS — G8929 Other chronic pain: Secondary | ICD-10-CM

## 2020-08-19 DIAGNOSIS — E66813 Obesity, class 3: Secondary | ICD-10-CM

## 2020-08-19 DIAGNOSIS — Z0289 Encounter for other administrative examinations: Secondary | ICD-10-CM

## 2020-08-19 DIAGNOSIS — E1169 Type 2 diabetes mellitus with other specified complication: Secondary | ICD-10-CM

## 2020-08-19 DIAGNOSIS — M25561 Pain in right knee: Secondary | ICD-10-CM

## 2020-08-19 MED ORDER — OZEMPIC (0.25 OR 0.5 MG/DOSE) 2 MG/1.5ML ~~LOC~~ SOPN: 0.2500 mg | PEN_INJECTOR | SUBCUTANEOUS | 0 refills | Status: DC

## 2020-08-19 MED ORDER — OZEMPIC (0.25 OR 0.5 MG/DOSE) 2 MG/1.5ML ~~LOC~~ SOPN: 0.5000 mg | PEN_INJECTOR | SUBCUTANEOUS | 0 refills | Status: DC

## 2020-08-20 ENCOUNTER — Encounter (INDEPENDENT_AMBULATORY_CARE_PROVIDER_SITE_OTHER): Payer: Self-pay

## 2020-08-20 NOTE — Progress Notes (Signed)
Dear Dr. Kieth Carter,   Thank you for referring Valerie Carter to our clinic. The following note includes my evaluation and treatment recommendations.  Chief Complaint:   OBESITY Valerie Carter (MR# [id number]) is a 60 y.o. female who presents for evaluation and treatment of obesity and related comorbidities. Current BMI is Body mass index is 57.67 kg/m. Valerie Carter has been struggling with her weight for many years and has been unsuccessful in either losing weight, maintaining weight loss, or reaching her healthy weight goal.  Valerie Carter is currently in the action stage of change and ready to dedicate time achieving and maintaining a healthier weight. Valerie Carter is interested in becoming our patient and working on intensive lifestyle modifications including (but not limited to) diet and exercise for weight loss.  History of bariatric surgery. She has been snacking and craving sweets, slider foods.  She is taking a bariatric multivitamin.  She says she is getting 60 grams of protein per day.  Her goal BMI is <40, 230 pounds.  Valerie Carter's habits were reviewed today and are as follows: her desired weight loss is 105 pounds, she has been heavy most of her life, her heaviest weight ever was 397 pounds, she craves sweets, she skips breakfast frequently, she is frequently drinking liquids with calories, she frequently makes poor food choices and she struggles with emotional eating.  Depression Screen Valerie Carter's Food and Mood (modified PHQ-9) score was 16.  Depression screen Valerie Carter 2/9 08/19/2020  Decreased Interest 3  Down, Depressed, Hopeless 1  PHQ - 2 Score 4  Altered sleeping 2  Tired, decreased energy 3  Change in appetite 1  Feeling bad or failure about yourself  3  Trouble concentrating 3  Moving slowly or fidgety/restless 0  Suicidal thoughts 0  PHQ-9 Score 16  Difficult doing work/chores Very difficult  Some recent data might be hidden   Assessment/Plan:   1. Other fatigue Valerie Carter  admits to daytime somnolence and reports waking up still tired. Patent has a history of symptoms of daytime fatigue and morning fatigue. Valerie Carter generally gets 5 hours of sleep per night, and states that she has poor quality sleep.  Apneic episodes are not present. Epworth Sleepiness Score is 4.  Lachell does not feel that her weight is causing her energy to be lower than it should be. Fatigue may be related to obesity, depression or many other causes. Labs will be ordered, and in the meanwhile, Valerie Carter will focus on self care including making healthy food choices, increasing physical activity and focusing on stress reduction.  - EKG 12-Lead - CBC with Differential/Platelet - TSH - T4, free - Vitamin B12  2. SOB (shortness of breath) on exertion Valerie Carter notes increasing shortness of breath with exercising and seems to be worsening over time with weight gain. She notes getting out of breath sooner with activity than she used to. This has gotten worse recently. Valerie Carter denies shortness of breath at rest or orthopnea.  Valerie Carter does feel that she gets out of breath more easily that she used to when she exercises. Valerie Carter's shortness of breath appears to be obesity related and exercise induced. She has agreed to work on weight loss and gradually increase exercise to treat her exercise induced shortness of breath. Will continue to monitor closely.  3. Visceral obesity Current visceral fat rating: 27. Visceral fat rating should be < 13. Visceral adipose tissue is a hormonally active component of total body fat. This body composition phenotype is associated with medical disorders  such as metabolic syndrome, cardiovascular disease and several malignancies including prostate, breast, and colorectal cancers. Starting goal: Lose 7-10% of starting weight.   4. Vitamin D deficiency Optimal goal > 50 ng/dL.  She is taking a multivitamin.  Plan:  Continue multivitamin.  Will check vitamin D level today, as per  below.  - VITAMIN D 25 Hydroxy (Vit-D Deficiency, Fractures)  5. Chronic pain of both knees Also with sciatica.  She takes hydrocodone as needed for pain.  6. Type 2 diabetes mellitus with other specified complication, without long-term current use of insulin ([place]) Diabetes Mellitus: Not at goal. Medication: None. Issues reviewed: blood sugar goals, complications of diabetes mellitus, hypoglycemia prevention and treatment, exercise, and nutrition.   Plan:  Start Ozempic 0.25 mg subcutaneously weekly.  The importance of regular follow up with PCP and all other specialists as scheduled was stressed to patient today.  Lab Results  Component Value Date   HGBA1C 6.0 03/28/2020   HGBA1C 5.5 02/10/2018   HGBA1C 6.7 05/02/2017   Lab Results  Component Value Date   MICROALBUR 0.8 02/06/2015   LDLCALC 110 (H) 05/15/2018   CREATININE 0.67 05/15/2018   - Comprehensive metabolic panel - Hemoglobin A1c - Insulin, random - Start Semaglutide,0.25 or 0.5MG /DOS, (OZEMPIC, 0.25 OR 0.5 MG/DOSE,) 2 MG/1.5ML SOPN; Inject 0.25 mg into the skin once a week.  Dispense: 1.5 mL; Refill: 0  7. Hypertension associated with diabetes ([place]) Uncontrolled.  Medications: None.   Plan: Avoid buying foods that are: processed, frozen, or prepackaged to avoid excess salt. We will continue to monitor closely alongside her PCP and/or Specialist.  Regular follow up with PCP and specialists was also encouraged.   BP Readings from Last 3 Encounters:  08/19/20 (!) 170/92  04/04/20 (!) 151/76  03/04/20 (!) 163/78   Lab Results  Component Value Date   CREATININE 0.67 05/15/2018   - CBC with Differential/Platelet - Comprehensive metabolic panel - TSH - T4, free  8. Hyperlipidemia associated with type 2 diabetes mellitus ([place]) Course: Not at goal. Lipid-lowering medications: None.   Plan: Dietary changes: Increase soluble fiber, decrease simple carbohydrates, decrease saturated fat. Exercise changes: Moderate to  vigorous-intensity aerobic activity 150 minutes per week or as tolerated. We will continue to monitor along with PCP/specialists as it pertains to her weight loss journey.  Lab Results  Component Value Date   CHOL 196 05/15/2018   HDL 69 05/15/2018   LDLCALC 110 (H) 05/15/2018   LDLDIRECT 114 (H) 09/07/2011   TRIG 87 05/15/2018   CHOLHDL 2.8 05/15/2018   Lab Results  Component Value Date   ALT 30 03/09/2018   AST 27 03/09/2018   ALKPHOS 74 03/09/2018   BILITOT 1.2 03/09/2018   The 10-year ASCVD risk score Mikey Bussing DC Jr., et al., 2013) is: 20.5%   Values used to calculate the score:     Age: 81 years     Sex: Female     Is Non-Hispanic African American: Yes     Diabetic: Yes     Tobacco smoker: No     Systolic Blood Pressure: 025 mmHg     Is BP treated: No     HDL Cholesterol: 69 mg/dL     Total Cholesterol: 196 mg/dL  - Lipid panel  9. Lactose intolerance Bela is lactose intolerant.    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 Kieth Carter.  Highest weight was 397 pounds (349 pounds at surgery).  Lowest weight after surgery was  268 pounds.  She is at risk for malnutrition due to her previous bariatric surgery.  She is taking a bariatric multivitamin.   Counseling  You may need to eat 3 meals and 2 snacks, or 5 small meals each day in order to reach your protein and calorie goals.   Allow at least 15 minutes for each meal so that you can eat mindfully. Listen to your body so that you do not overeat. For most people, your sleeve or pouch will comfortably hold 4-6 ounces.  Eat foods from all food groups. This includes fruits and vegetables, grains, dairy, and meat and other proteins.  Include a protein-rich food at every meal and snack, and eat the protein food first.   You should be taking a Bariatric Multivitamin as well as calcium.   11. Other depression. with emotional eating Valerie Carter is struggling with emotional eating  and using food for comfort to the extent that it is negatively impacting her health. She has been working on behavior modification techniques to help reduce her emotional eating and has been unsuccessful. She shows no sign of suicidal or homicidal ideations.  PHQ-9 is 16.  Behavior modification techniques were discussed today to help Valerie Carter deal with her emotional/non-hunger eating behaviors.  Orders and follow up as documented in patient record.   12. At risk for heart disease Due to Shakya's current state of health and medical condition(s), she is at a higher risk for heart disease.  This puts the patient at much greater risk to subsequently develop cardiopulmonary conditions that can significantly affect patient's quality of life in a negative manner.    At least 10 minutes were spent on counseling Valerie Carter about these concerns today. Counseling:  Intensive lifestyle modifications were discussed with Valerie Carter as the most appropriate first line of treatment.  she will continue to work on diet, exercise, and weight loss efforts.  We will continue to reassess these conditions on a fairly regular basis in an attempt to decrease the patient's overall morbidity and mortality.  Evidence-based interventions for health behavior change were utilized today including the discussion of self monitoring techniques, problem-solving barriers, and SMART goal setting techniques.  Specifically, regarding patient's less desirable eating habits and patterns, we employed the technique of small changes when Valerie Carter has not been able to fully commit to her prudent nutritional plan.  13. Class 3 severe obesity with serious comorbidity and body mass index (BMI) of 50.0 to 59.9 in adult, unspecified obesity type [facility])  Valerie Carter is currently in the action stage of change and her goal is to continue with weight loss efforts. I recommend Valerie Carter begin the structured treatment plan as follows:  She has agreed to the Category 3  Plan.  Exercise goals: No exercise has been prescribed at this time.   Behavioral modification strategies: increasing lean protein intake, decreasing simple carbohydrates, increasing vegetables, increasing water intake, decreasing liquid calories, decreasing alcohol intake, decreasing sodium intake and increasing high fiber foods.  She was informed of the importance of frequent follow-up visits to maximize her success with intensive lifestyle modifications for her multiple health conditions. She was informed we would discuss her lab results at her next visit unless there is a critical issue that needs to be addressed sooner. Jasime agreed to keep her next visit at the agreed upon time to discuss these results.  Objective:   Blood pressure (!) 170/92, pulse 90, temperature 97.9 F (36.6 C), temperature source Oral, height 5\' 4"  (1.626 m), weight (!) 336 lb (  152.4 kg), SpO2 96 %. Body mass index is 57.67 kg/m.  EKG: Normal sinus rhythm, rate 83 bpm.  Indirect Calorimeter completed today shows a VO2 of 288 and a REE of 2004.  Her calculated basal metabolic rate is 2951 thus her basal metabolic rate is better than expected.  General: Cooperative, alert, well developed, in no acute distress. HEENT: Conjunctivae and lids unremarkable. Cardiovascular: Regular rhythm.  Lungs: Normal work of breathing. Neurologic: No focal deficits.   Lab Results  Component Value Date   CREATININE 0.67 05/15/2018   BUN 21 05/15/2018   NA 139 05/15/2018   K 4.0 05/15/2018   CL 102 05/15/2018   CO2 24 05/15/2018   Lab Results  Component Value Date   ALT 30 03/09/2018   AST 27 03/09/2018   ALKPHOS 74 03/09/2018   BILITOT 1.2 03/09/2018   Lab Results  Component Value Date   HGBA1C 6.0 03/28/2020   HGBA1C 5.5 02/10/2018   HGBA1C 6.7 05/02/2017   HGBA1C 9.6 01/28/2017   HGBA1C 7.9 07/23/2016   Lab Results  Component Value Date   TSH 0.45 09/11/2015   Lab Results  Component Value Date   CHOL 196  05/15/2018   HDL 69 05/15/2018   LDLCALC 110 (H) 05/15/2018   LDLDIRECT 114 (H) 09/07/2011   TRIG 87 05/15/2018   CHOLHDL 2.8 05/15/2018   Lab Results  Component Value Date   WBC 8.8 03/09/2018   HGB 14.3 03/09/2018   HCT 43.8 03/09/2018   MCV 82.3 03/09/2018   PLT 249 03/09/2018   Lab Results  Component Value Date   IRON 31 (L) 09/11/2015   TIBC 287 09/11/2015   FERRITIN 56 09/11/2015   Attestation Statements:   This is the patient's first visit at [facility]. The patient's NEW PATIENT PACKET was reviewed at length. Included in the packet: current and past health history, medications, allergies, ROS, gynecologic history (women only), surgical history, family history, social history, weight history, weight loss surgery history (for those that have had weight loss surgery), nutritional evaluation, mood and food questionnaire, PHQ9, Epworth questionnaire, sleep habits questionnaire, patient life and health improvement goals questionnaire. These will all be scanned into the patient's chart under media.   During the visit, I independently reviewed the patient's EKG, bioimpedance scale results, and indirect calorimeter results. I used this information to tailor a meal plan for the patient that will help her to lose weight and will improve her obesity-related conditions going forward. I performed a medically necessary appropriate examination and/or evaluation. I discussed the assessment and treatment plan with the patient. The patient was provided an opportunity to ask questions and all were answered. The patient agreed with the plan and demonstrated an understanding of the instructions. Labs were ordered at this visit and will be reviewed at the next visit unless more critical results need to be addressed immediately. Clinical information was updated and documented in the EMR.   I, Water quality scientist, CMA, am acting as transcriptionist for Briscoe Deutscher, DO  I have reviewed the  above documentation for accuracy and completeness, and I agree with the above. Briscoe Deutscher, DO

## 2020-09-02 ENCOUNTER — Encounter (INDEPENDENT_AMBULATORY_CARE_PROVIDER_SITE_OTHER): Payer: Self-pay | Admitting: Family Medicine

## 2020-09-02 ENCOUNTER — Ambulatory Visit (INDEPENDENT_AMBULATORY_CARE_PROVIDER_SITE_OTHER): Payer: BC Managed Care – PPO | Admitting: Family Medicine

## 2020-09-02 ENCOUNTER — Other Ambulatory Visit: Payer: Self-pay

## 2020-09-02 VITALS — BP 175/84 | HR 94 | Temp 97.9°F | Ht 64.0 in | Wt 339.0 lb

## 2020-09-02 DIAGNOSIS — E1159 Type 2 diabetes mellitus with other circulatory complications: Secondary | ICD-10-CM

## 2020-09-02 DIAGNOSIS — F418 Other specified anxiety disorders: Secondary | ICD-10-CM | POA: Diagnosis not present

## 2020-09-02 DIAGNOSIS — Z9189 Other specified personal risk factors, not elsewhere classified: Secondary | ICD-10-CM

## 2020-09-02 DIAGNOSIS — Z79899 Other long term (current) drug therapy: Secondary | ICD-10-CM

## 2020-09-02 DIAGNOSIS — E66813 Obesity, class 3: Secondary | ICD-10-CM

## 2020-09-02 DIAGNOSIS — E1169 Type 2 diabetes mellitus with other specified complication: Secondary | ICD-10-CM | POA: Diagnosis not present

## 2020-09-02 DIAGNOSIS — I152 Hypertension secondary to endocrine disorders: Secondary | ICD-10-CM | POA: Diagnosis not present

## 2020-09-02 DIAGNOSIS — Z6841 Body Mass Index (BMI) 40.0 and over, adult: Secondary | ICD-10-CM

## 2020-09-03 ENCOUNTER — Telehealth: Payer: Self-pay | Admitting: *Deleted

## 2020-09-03 NOTE — Progress Notes (Signed)
Chief Complaint:   OBESITY Valerie Carter is here to discuss her progress with her obesity treatment plan along with follow-up of her obesity related diagnoses.   Today's visit was #: 2 Starting weight: 336 lbs Starting date: 08/19/2020 Today's weight: 339 lbs Today's date: 09/02/2020 Total lbs lost to date: 0 Body mass index is 58.19 kg/m.   Interim History:  Valerie Carter has pitting edema to mid shin.  She says she has Atkins shakes for breakfast.  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  She says she is exhausted. Current Meal Plan: the Category 3 Plan for 70% of the time.  Current Exercise Plan: None.  Assessment/Plan:   1. Hypertension associated with type 2 diabetes mellitus ([place]) Not at goal. Medications: None.  Elevated again today.  She is hesitant to start medication again, but agrees to if blood pressure is elevated at her next visit.  Plan: Avoid buying foods that are: processed, frozen, or prepackaged to avoid excess salt. We will continue to monitor closely alongside her PCP and/or Specialist.  Regular follow up with PCP and specialists was also encouraged.   BP Readings from Last 3 Encounters:  09/02/20 (!) 175/84  08/19/20 (!) 170/92  04/04/20 (!) 151/76   Lab Results  Component Value Date   CREATININE 0.67 05/15/2018   2. Type 2 diabetes mellitus with other specified complication, without long-term current use of insulin ([facility]) Diabetes Mellitus: Not at goal. Medication: None. Issues reviewed: blood sugar goals, complications of diabetes mellitus, hypoglycemia prevention and treatment, exercise, and nutrition.  Unable to afford Ozempic ($800).    Plan:  Will look into patient assistance for Ozempic.  Will place Valerie Carter referral today for unable to afford medications.  The importance of regular follow up with PCP and all other specialists as scheduled was stressed to patient today.  Lab Results  Component Value  Date   HGBA1C 6.0 03/28/2020   HGBA1C 5.5 02/10/2018   HGBA1C 6.7 05/02/2017   Lab Results  Component Value Date   MICROALBUR 0.8 02/06/2015   LDLCALC 110 (H) 05/15/2018   CREATININE 0.67 05/15/2018   - AMB Referral to [facility]  3. Medication management Unable to get labs today.  [facility].  4. Situational anxiety Reviewed the importance of healthy boundaries and self-care.  Behavior modification techniques were discussed today to help Valerie Carter deal with her anxiety.    5. At risk for heart disease Due to Valerie Carter's current state of health and medical condition(s), she is at a higher risk for heart disease.  This puts the patient at much greater risk to subsequently develop cardiopulmonary conditions that can significantly affect patient's quality of life in a negative manner.    At least 8 minutes were spent on counseling Valerie Carter about these concerns today, and I stressed the importance of reversing risks factors of obesity, especially truncal and visceral fat, hypertension, hyperlipidemia, and pre-diabetes.  The initial goal is to lose at least 5-10% of starting weight to help reduce these risk factors. We will continue to reassess these conditions on a fairly regular basis in an attempt to decrease the patient's overall morbidity and mortality.  Evidence-based interventions for health behavior change were utilized today including the discussion of self monitoring techniques, problem-solving barriers, and SMART goal setting techniques.  Specifically, regarding patient's less desirable eating habits and patterns, we employed the technique of small changes when Valerie Carter has  not been able to fully commit to her prudent nutritional plan.  6. Class 3 severe obesity with serious comorbidity and body mass index (BMI) of 50.0 to 59.9 in adult, unspecified obesity type (Valerie Carter)  Course: Valerie Carter is currently in the action stage of change. As such, her goal is to continue  with weight loss efforts.   Nutrition goals: She has agreed to the Category 3 Plan.  Watch sodium.   Exercise goals: No exercise has been prescribed at this time.  Behavioral modification strategies: increasing lean protein intake, decreasing simple carbohydrates, increasing vegetables, increasing water intake and decreasing sodium intake.  Valerie Carter has agreed to follow-up with our clinic in 2 weeks. She was informed of the importance of frequent follow-up visits to maximize her success with intensive lifestyle modifications for her multiple health conditions.   Objective:   Blood pressure (!) 175/84, pulse 94, temperature 97.9 F (36.6 C), temperature source Oral, height 5\' 4"  (1.626 m), weight (!) 339 lb (153.8 kg), SpO2 99 %. Body mass index is 58.19 kg/m.  General: Cooperative, alert, well developed, in no acute distress. HEENT: Conjunctivae and lids unremarkable. Cardiovascular: Regular rhythm.  Lungs: Normal work of breathing. Neurologic: No focal deficits.   Lab Results  Component Value Date   CREATININE 0.67 05/15/2018   BUN 21 05/15/2018   NA 139 05/15/2018   K 4.0 05/15/2018   CL 102 05/15/2018   CO2 24 05/15/2018   Lab Results  Component Value Date   ALT 30 03/09/2018   AST 27 03/09/2018   ALKPHOS 74 03/09/2018   BILITOT 1.2 03/09/2018   Lab Results  Component Value Date   HGBA1C 6.0 03/28/2020   HGBA1C 5.5 02/10/2018   HGBA1C 6.7 05/02/2017   HGBA1C 9.6 01/28/2017   HGBA1C 7.9 07/23/2016   Lab Results  Component Value Date   TSH 0.45 09/11/2015   Lab Results  Component Value Date   CHOL 196 05/15/2018   HDL 69 05/15/2018   LDLCALC 110 (H) 05/15/2018   LDLDIRECT 114 (H) 09/07/2011   TRIG 87 05/15/2018   CHOLHDL 2.8 05/15/2018   Lab Results  Component Value Date   WBC 8.8 03/09/2018   HGB 14.3 03/09/2018   HCT 43.8 03/09/2018   MCV 82.3 03/09/2018   PLT 249 03/09/2018   Lab Results  Component Value Date   IRON 31 (L) 09/11/2015   TIBC 287  09/11/2015   FERRITIN 56 09/11/2015   Attestation Statements:   Reviewed by clinician on day of visit: allergies, medications, problem list, medical history, surgical history, family history, social history, and previous encounter notes.  I, Water quality scientist, CMA, am acting as transcriptionist for Briscoe Deutscher, DO  I have reviewed the above documentation for accuracy and completeness, and I agree with the above. Briscoe Deutscher, DO

## 2020-09-03 NOTE — Chronic Care Management (AMB) (Signed)
  Care Management   Note  09/03/2020 Name: [name] MRN: [id number] DOB: Dec 26, 1960  Valerie Carter is a 60 y.o. year old female who is a primary care patient of Simmons-Robinson, Riki Sheer, MD. I reached out to Valerie Carter by phone today in response to a referral sent by Ms. Vira Browns Melder's PCP,  Simmons-Robinson, Riki Sheer, MD.  Ms. Eklund was given information about care management services today including:  1. Care management services include personalized support from designated clinical staff supervised by her physician, including individualized plan of care and coordination with other care providers 2. 24/7 contact phone numbers for assistance for urgent and routine care needs. 3. The patient may stop care management services at any time by phone call to the office staff.  Patient agreed to services and verbal consent obtained.   Follow up plan: Telephone appointment with care management team member scheduled for:09/10/2020  [facility]

## 2020-09-10 ENCOUNTER — Ambulatory Visit: Payer: BC Managed Care – PPO

## 2020-09-10 DIAGNOSIS — E559 Vitamin D deficiency, unspecified: Secondary | ICD-10-CM | POA: Diagnosis not present

## 2020-09-10 DIAGNOSIS — E1169 Type 2 diabetes mellitus with other specified complication: Secondary | ICD-10-CM | POA: Diagnosis not present

## 2020-09-10 DIAGNOSIS — R5383 Other fatigue: Secondary | ICD-10-CM | POA: Diagnosis not present

## 2020-09-10 DIAGNOSIS — E785 Hyperlipidemia, unspecified: Secondary | ICD-10-CM | POA: Diagnosis not present

## 2020-09-11 LAB — INSULIN, RANDOM: INSULIN: 14.8 u[IU]/mL (ref 2.6–24.9)

## 2020-09-11 LAB — COMPREHENSIVE METABOLIC PANEL
ALT: 21 IU/L (ref 0–32)
AST: 30 IU/L (ref 0–40)
Albumin/Globulin Ratio: 1 — ABNORMAL LOW (ref 1.2–2.2)
Albumin: 3.9 g/dL (ref 3.8–4.9)
Alkaline Phosphatase: 102 IU/L (ref 44–121)
BUN/Creatinine Ratio: 22 (ref 9–23)
BUN: 17 mg/dL (ref 6–24)
Bilirubin Total: 0.7 mg/dL (ref 0.0–1.2)
CO2: 21 mmol/L (ref 20–29)
Calcium: 10 mg/dL (ref 8.7–10.2)
Chloride: 102 mmol/L (ref 96–106)
Creatinine, Ser: 0.79 mg/dL (ref 0.57–1.00)
Globulin, Total: 3.9 g/dL (ref 1.5–4.5)
Glucose: 104 mg/dL — ABNORMAL HIGH (ref 65–99)
Potassium: 4.1 mmol/L (ref 3.5–5.2)
Sodium: 139 mmol/L (ref 134–144)
Total Protein: 7.8 g/dL (ref 6.0–8.5)
eGFR: 86 mL/min/{1.73_m2} (ref >59)

## 2020-09-11 LAB — CBC WITH DIFFERENTIAL/PLATELET
Basophils Absolute: 0 10*3/uL (ref 0.0–0.2)
Basos: 1 %
EOS (ABSOLUTE): 0.3 10*3/uL (ref 0.0–0.4)
Eos: 5 %
Hematocrit: 40.8 % (ref 34.0–46.6)
Hemoglobin: 13.2 g/dL (ref 11.1–15.9)
Immature Grans (Abs): 0 10*3/uL (ref 0.0–0.1)
Immature Granulocytes: 0 %
Lymphocytes Absolute: 2.3 10*3/uL (ref 0.7–3.1)
Lymphs: 36 %
MCH: 26.2 pg — ABNORMAL LOW (ref 26.6–33.0)
MCHC: 32.4 g/dL (ref 31.5–35.7)
MCV: 81 fL (ref 79–97)
Monocytes Absolute: 0.8 10*3/uL (ref 0.1–0.9)
Monocytes: 12 %
Neutrophils Absolute: 3 10*3/uL (ref 1.4–7.0)
Neutrophils: 46 %
Platelets: 259 10*3/uL (ref 150–450)
RBC: 5.03 x10E6/uL (ref 3.77–5.28)
RDW: 14.4 % (ref 11.7–15.4)
WBC: 6.4 10*3/uL (ref 3.4–10.8)

## 2020-09-11 LAB — T4, FREE: Free T4: 1.2 ng/dL (ref 0.82–1.77)

## 2020-09-11 LAB — VITAMIN D 25 HYDROXY (VIT D DEFICIENCY, FRACTURES): Vit D, 25-Hydroxy: 41.2 ng/mL (ref 30.0–100.0)

## 2020-09-11 LAB — LIPID PANEL
Chol/HDL Ratio: 3 ratio (ref 0.0–4.4)
Cholesterol, Total: 212 mg/dL — ABNORMAL HIGH (ref 100–199)
HDL: 70 mg/dL (ref >39)
LDL Chol Calc (NIH): 132 mg/dL — ABNORMAL HIGH (ref 0–99)
Triglycerides: 59 mg/dL (ref 0–149)
VLDL Cholesterol Cal: 10 mg/dL (ref 5–40)

## 2020-09-11 LAB — TSH: TSH: 0.872 u[IU]/mL (ref 0.450–4.500)

## 2020-09-11 LAB — HEMOGLOBIN A1C
Est. average glucose Bld gHb Est-mCnc: 151 mg/dL
Hgb A1c MFr Bld: 6.9 % — ABNORMAL HIGH (ref 4.8–5.6)

## 2020-09-11 LAB — VITAMIN B12: Vitamin B-12: >2000 pg/mL — ABNORMAL HIGH (ref 232–1245)

## 2020-09-11 NOTE — Patient Instructions (Signed)
Ms. Valerie Carter  it was nice speaking with you. Please call me directly [phone] if you have questions about the goals we discussed.  Goals Addressed            This Visit's Progress   . Achieve a Healthy Weight-Obesity       Timeframe:  Long-Range Goal Priority:  High Start Date:        09/10/20                     Expected End Date:     11/25/20                  Follow Up Date 09/26/20   - drink 6 to 8 glasses of water each day - join a weight loss program - prepare main meal at home 3 to 5 days each week - set a realistic goal    Why is this important?    When you are ready to manage your weight, have a plan and have set a goal, it is time to take action.   Taking small steps to change how you eat and exercise is a good place to start.    Notes:        Patient Care Plan: RN Case Management  Problem Identified: Weight Management (Obesity)   Long-Range Goal: Patient will work to Achieve weight lossw goal   Start Date: 09/10/2020  Expected End Date: 11/25/2020  Priority: High  Current Barriers:  . Care Coordination needs related to Diabetes medication used for weight loss in a patient with BMI greater than 40 : obesity class III-RNCM was sent a referral from weight management to help the patient with a medication Ozempic.  This medication will be  used to help her with weight loss.  The insurance states that it will cost her 834.97  Nurse Case Manager Clinical Goal(s):  . patient will verbalize understanding of plan for obtaining medication to help with weight loss . patient will work with Pharmacy to address needs related to Medication Assitance  Interventions:  . 1:1 collaboration with Valerie Foster, MD regarding development and update of comprehensive plan of care as evidenced by provider attestation and co-signature . Inter-disciplinary care team collaboration (see longitudinal plan of care) . Evaluation of current treatment plan related to office note on  and patient's adherence to plan as established by provider. . Reviewed medications with patient  . Pharmacy referral for medication sent to [facility] via staff message as preferred route of communication . Discussed plans with patient for ongoing care management follow up and provided patient with direct contact information for care management team . Patient will be mailed a diabetes packet and High Blood pressure booklet . Consider referral to weight-loss program - [facility]. Last visit 09/02/20    Patient Goals/Self-Care Activities Over the next 30 days, patient will: Patient will self administer medications as prescribed Patient will attend all scheduled provider appointments Patient will call pharmacy for medication refills Patient will continue to perform ADL's independently Patient will continue to perform IADL's independently Patient will call provider office for new concerns or questions                 Valerie Carter received Care Management services today:  1. Care Management services include personalized support from designated clinical staff supervised by her physician, including individualized plan of care and coordination with other care providers 2. 24/7 contact [phone] for assistance  for urgent and routine care needs. 3. Care Management are voluntary services and be declined at any time by calling the office.  The patient verbalized understanding of instructions provided today and declined a print copy of patient instruction materials.      Follow up Plan: Patient would like continued follow-up.  CCM RNCM will outreach the patient within the next 14 days.. Patient will call office if needed prior to next encounter  Valerie Arms, RN

## 2020-09-11 NOTE — Chronic Care Management (AMB) (Signed)
Care Management    RN Visit Note  09/11/2020 Name: Valerie Carter MRN: [id number] DOB: 18-Sep-1960  Subjective: Valerie Carter is a 60 y.o. year old female who is a primary care patient of Simmons-Robinson, Makiera, MD. The care management team was consulted for assistance with disease management and care coordination needs.    Engaged with patient by telephone for follow up visit in response to provider referral for case management and/or care coordination services.   Consent to Services:   Valerie Carter was given information about Care Management services today including:  1. Care Management services includes personalized support from designated clinical staff supervised by her physician, including individualized plan of care and coordination with other care providers 2. 24/7 contact phone numbers for assistance for urgent and routine care needs. 3. The patient may stop case management services at any time by phone call to the office staff.  Patient agreed to services and consent obtained.   Assessment: Patient is currently experiencing difficulty with paying for weight loss medication... See Care Plan below for interventions and patient self-care actives. Follow up Plan: Patient would like continued follow-up.  CCM RNCM will outreach the patient within the next 14 days.. Patient will call office if needed prior to next encounter  Review of patient past medical history, allergies, medications, health status, including review of consultants reports, laboratory and other test data, was performed as part of comprehensive evaluation and provision of chronic care management services.   SDOH (Social Determinants of Health) assessments and interventions performed:    Care Plan  No Known Allergies  Outpatient Encounter Medications as of 09/10/2020  Medication Sig  . HYDROcodone-acetaminophen (NORCO) 10-325 MG tablet Take one tablet at bedtime Sunday through Friday with additional doses  on the weekend as directed   No facility-administered encounter medications on file as of 09/10/2020.    Patient Active Problem List   Diagnosis Date Noted  . Rotator cuff impingement syndrome of left shoulder 01/07/2019  . Sciatica of right side 05/24/2018  . Constipation 03/16/2018  . 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 Intramural leiomyoma of uterus 01/27/2016  . Healthcare maintenance 02/06/2015  . Mild depression ([place]) 09/25/2014  . Vitamin D deficiency 10/19/2013  . Seasonal allergies 09/07/2011  . Arthritis of knee, degenerative 10/29/2010  . Diet-controlled diabetes mellitus ([place]) 05/20/2010  . Low back pain 01/29/2010  . Hyperlipidemia LDL goal <70 10/01/2008  . Obesity 10/24/2006  . HYPERTENSION, BENIGN ESSENTIAL 09/26/2006    Conditions to be addressed/monitored: Weight management medication  Care Plan : RN Case Management  Updates made by Lazaro Arms, RN since 09/11/2020 12:00 AM    Problem: Weight Management (Obesity)     Long-Range Goal: Patient will work to Achieve weight lossw goal   Start Date: 09/10/2020  Expected End Date: 11/25/2020  Priority: High  Note:   Current Barriers:  . Care Coordination needs related to Diabetes medication used for weight loss in a patient with BMI greater than 40 : obesity class III-RNCM was sent a referral from weight management to help the patient with a medication Ozempic.  This medication will be  used to help her with weight loss.  The insurance states that it will cost her 834.97  Nurse Case Manager Clinical Goal(s):  . patient will verbalize understanding of plan for obtaining medication to help with weight loss . patient will work with Pharmacy to address needs related to Medication Assitance  Interventions:  . 1:1 collaboration  with Simmons-Robinson, Riki Sheer, MD regarding development and update of comprehensive plan of care as evidenced by provider attestation and  co-signature . Inter-disciplinary care team collaboration (see longitudinal plan of care) . Evaluation of current treatment plan related to office note on and patient's adherence to plan as established by provider. . Reviewed medications with patient  . Pharmacy referral for medication sent to [facility] via staff message as preferred route of communication . Discussed plans with patient for ongoing care management follow up and provided patient with direct contact information for care management team . Patient will be mailed a diabetes packet and High Blood pressure booklet . Consider referral to weight-loss program - [facility]. Last visit 09/02/20    Patient Goals/Self-Care Activities Over the next 30 days, patient will: Patient will self administer medications as prescribed Patient will attend all scheduled provider appointments Patient will call pharmacy for medication refills Patient will continue to perform ADL's independently Patient will continue to perform IADL's independently Patient will call provider office for new concerns or questions               Lazaro Arms RN, BSN, [facility] Care Management Coordinator [place] Phone: [phone] I Fax: [phone]

## 2020-09-12 ENCOUNTER — Telehealth: Payer: Self-pay | Admitting: Pharmacist

## 2020-09-12 NOTE — Telephone Encounter (Signed)
Attempted to reach patient to discuss cost of Ozempic. Left voicemail requesting call back. Did reach out to pharmacy to confirm that medication is covered by insurance but cost even with coverage is $800. Will discuss further with patient when she returns phone call.

## 2020-09-16 ENCOUNTER — Encounter (INDEPENDENT_AMBULATORY_CARE_PROVIDER_SITE_OTHER): Payer: Self-pay | Admitting: Physician Assistant

## 2020-09-16 ENCOUNTER — Ambulatory Visit (INDEPENDENT_AMBULATORY_CARE_PROVIDER_SITE_OTHER): Payer: BC Managed Care – PPO | Admitting: Physician Assistant

## 2020-09-16 ENCOUNTER — Other Ambulatory Visit: Payer: Self-pay

## 2020-09-16 ENCOUNTER — Telehealth: Payer: Self-pay

## 2020-09-16 VITALS — BP 121/77 | HR 97 | Temp 97.7°F | Ht 64.0 in | Wt 336.0 lb

## 2020-09-16 DIAGNOSIS — E1169 Type 2 diabetes mellitus with other specified complication: Secondary | ICD-10-CM | POA: Diagnosis not present

## 2020-09-16 DIAGNOSIS — M170 Bilateral primary osteoarthritis of knee: Secondary | ICD-10-CM

## 2020-09-16 DIAGNOSIS — E559 Vitamin D deficiency, unspecified: Secondary | ICD-10-CM | POA: Diagnosis not present

## 2020-09-16 DIAGNOSIS — Z6841 Body Mass Index (BMI) 40.0 and over, adult: Secondary | ICD-10-CM

## 2020-09-16 DIAGNOSIS — Z9189 Other specified personal risk factors, not elsewhere classified: Secondary | ICD-10-CM

## 2020-09-16 MED ORDER — HYDROCODONE-ACETAMINOPHEN 10-325 MG PO TABS: ORAL_TABLET | ORAL | 0 refills | Status: DC

## 2020-09-16 MED ORDER — VITAMIN D (ERGOCALCIFEROL) 1.25 MG (50000 UNIT) PO CAPS: 50000.0000 [IU] | ORAL_CAPSULE | ORAL | 0 refills | Status: DC

## 2020-09-16 NOTE — Telephone Encounter (Signed)
-----   Message from Carolyne Littles sent at 09/16/2020 10:40 AM EDT ----- Regarding: refill request Pt is asking for refill on hydrocodone. She has 2 pills left. Pharmacy is the same.

## 2020-09-17 ENCOUNTER — Ambulatory Visit: Payer: BC Managed Care – PPO | Admitting: Pharmacist

## 2020-09-17 ENCOUNTER — Other Ambulatory Visit: Payer: Self-pay

## 2020-09-17 VITALS — Ht 64.0 in | Wt 341.0 lb

## 2020-09-17 DIAGNOSIS — Z6841 Body Mass Index (BMI) 40.0 and over, adult: Secondary | ICD-10-CM

## 2020-09-17 NOTE — Progress Notes (Signed)
   Subjective:    Patient ID: Valerie Carter, female    DOB: 11-24-1960, 60 y.o.   MRN: [id number]  HPI Patient is a 60 y.o. female who presents for Ozempic initiation to supplement weight loss. She is in good spirits and presents without assistance. Patient was referred on 09/02/20 from [facility] to obtain Ozempic and was last seen by Primary Care Provider 03/28/2020.  PMH: T2DM  Insurance coverage/medication affordability: Commercial  Objective:   Labs:   Vitals:   09/17/20 1105  Weight: (!) 341 lb (154.7 kg)  Height: 5\' 4"  (1.626 m)    Lab Results  Component Value Date   HGBA1C 6.9 (H) 09/10/2020   HGBA1C 6.0 03/28/2020   HGBA1C 5.5 02/10/2018   PHQ-9 Score: 8  Assessment/Plan:   Pharmacotherapy is appropriate to pursue as augmentation for weight loss. Will initiate patient on Ozempic 0.25mg  and provided patient sample. Patient educated on purpose, proper use and potential adverse effects of Ozempic. Upon further discussion with patient during visit it appears her $800 deductible is specifically for medications, not overall healthcare. Initial plan prior to this knowledge was to supply samples until she meets her deductible, however, based on patient's current medication list she will likely not meet this deductible for the year. Discussed with patient that we will be unable to continue to provide samples of Ozempic for the year, and that any other GLP-1 within the class of medications will cost her several hundred dollars on first fill as she must meet her initial deductible cost. Patient expressed that this is not feasible. Patient instructed to continue to follow-up with [facility]. Following instruction patient verbalized understanding of treatment plan.    . Start Ozempic 0.25mg  once weekly and then will consider titrating dose to 0.5mg  after discussion of tolerability . Comprehensive medication review performed, medication list updated  in electronic medical record . Patient will report any questions or concerns to provider  . Will route message to provider regarding concerns of elevated PHQ-9 and patient may benefit from discussion with PCP regarding initiating pharmacotherapy in the future  Follow-up appointment in four weeks to review tolerability to Ozempic. Written patient instructions provided.  This appointment required 25 minutes of patient care (this includes precharting, chart review, review of results, and face-to-face care).  Thank you for involving pharmacy to assist in providing this patient's care.  Patient seen with Inis Sizer, PharmD Candidate, and Shauna Hugh, PharmD - PGY-1 Resident.   Medication Samples have been provided to the patient.  Drug name: Ozempic (semaglutide)       Strength: 1.34mg /mL        Qty: 1 pen  LOT: [id number] Exp.Date: 12/26/2022  Dosing instructions: Inject 0.25mg  once weekly  The patient has been instructed regarding the correct time, dose, and frequency of taking this medication, including desired effects and most common side effects.   Valerie Carter 11:07 AM 09/17/2020

## 2020-09-17 NOTE — Patient Instructions (Signed)
Valerie Carter it was a pleasure seeing you today!  Today we discussed a plan for weight management  1. START Ozempic 0.25mg  once weekly   Follow-up via telephone call with me in one month.  If you have any questions or concerns before your next appointment please call the clinic or my direct number [phone]

## 2020-09-18 NOTE — Progress Notes (Signed)
Chief Complaint:   OBESITY Valerie Carter is here to discuss her progress with her obesity treatment plan along with follow-up of her obesity related diagnoses. Valerie Carter is on the Category 3 Plan and states she is following her eating plan approximately 50% of the time. Valerie Carter states she is doing 0 minutes 0 times per week.  Today's visit was #: 3 Starting weight: 336 lbs Starting date: 08/19/2020 Today's weight: 336 lbs Today's date: 09/16/2020 Total lbs lost to date: 0 Total lbs lost since last in-office visit: 3  Interim History: Valerie Carter reports that often she skips breakfast. 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 She is averaging between 40-45 grams of protein daily. She wants to journal.  Subjective:   1. Type 2 diabetes mellitus with other specified complication, without long-term current use of insulin (HCC) [name] is not on medications. Last A1c was 6.9, and she denies polyphagia. She eats a lot of carbohydrates and not enough protein. I discussed labs with the patient today.  2. Vitamin D deficiency [name] denies nausea, vomiting, or muscle weakness on Vit D. She is not taking it consistently. Last Vit D level was not at goal. I discussed labs with the patient today.  3. At risk for heart disease [name] is at a higher than average risk for cardiovascular disease due to obesity.   Assessment/Plan:   1. Type 2 diabetes mellitus with other specified complication, without long-term current use of insulin (HCC) Good blood sugar control is important to decrease the likelihood of diabetic complications such as nephropathy, neuropathy, limb loss, blindness, coronary artery disease, and death. Intensive lifestyle modification including diet, exercise and weight loss are the first line of treatment for diabetes. Valerie Carter will continue her meal, and will continue to follow up as directed.  2. Vitamin D deficiency Low Vitamin D level contributes to fatigue and  are associated with obesity, breast, and colon cancer. Valerie Carter agreed to start prescription Vitamin D 50,000 IU every week with no refills. She will follow-up for routine testing of Vitamin D, at least 2-3 times per year to avoid over-replacement.  - Vitamin D, Ergocalciferol, (DRISDOL) 1.25 MG (50000 UNIT) CAPS capsule; Take 1 capsule (50,000 Units total) by mouth every 7 (seven) days.  Dispense: 4 capsule; Refill: 0  3. At risk for heart disease Valerie Carter was given approximately 15 minutes of coronary artery disease prevention counseling today. She is 60 y.o. female and has risk factors for heart disease including obesity. We discussed intensive lifestyle modifications today with an emphasis on specific weight loss instructions and strategies.   Repetitive spaced learning was employed today to elicit superior memory formation and behavioral change.  4. Class 3 severe obesity with serious comorbidity and body mass index (BMI) of 50.0 to 59.9 in adult, unspecified obesity type [facility]) [name] is currently in the action stage of change. As such, her goal is to continue with weight loss efforts. She has agreed to keeping a food journal and adhering to recommended goals of 1200-1500 calories and 90 grams of protein daily.   Exercise goals: No exercise has been prescribed at this time.  Behavioral modification strategies: increasing lean protein intake and no skipping meals.  [name] has agreed to follow-up with our clinic in 2 weeks. She was informed of the importance of frequent follow-up visits to maximize her success with intensive lifestyle modifications for her multiple health conditions.   Objective:   Blood pressure 121/77, pulse 97, temperature  97.7 F (36.5 C), height 5\' 4"  (1.626 m), weight (!) 336 lb (152.4 kg), SpO2 99 %. Body mass index is 57.67 kg/m.  General: Cooperative, alert, well developed, in no acute distress. HEENT: Conjunctivae and lids unremarkable. Cardiovascular: Regular  rhythm.  Lungs: Normal work of breathing. Neurologic: No focal deficits.   Lab Results  Component Value Date   CREATININE 0.79 09/10/2020   BUN 17 09/10/2020   NA 139 09/10/2020   K 4.1 09/10/2020   CL 102 09/10/2020   CO2 21 09/10/2020   Lab Results  Component Value Date   ALT 21 09/10/2020   AST 30 09/10/2020   ALKPHOS 102 09/10/2020   BILITOT 0.7 09/10/2020   Lab Results  Component Value Date   HGBA1C 6.9 (H) 09/10/2020   HGBA1C 6.0 03/28/2020   HGBA1C 5.5 02/10/2018   HGBA1C 6.7 05/02/2017   HGBA1C 9.6 01/28/2017   Lab Results  Component Value Date   INSULIN 14.8 09/10/2020   Lab Results  Component Value Date   TSH 0.872 09/10/2020   Lab Results  Component Value Date   CHOL 212 (H) 09/10/2020   HDL 70 09/10/2020   LDLCALC 132 (H) 09/10/2020   LDLDIRECT 114 (H) 09/07/2011   TRIG 59 09/10/2020   CHOLHDL 3.0 09/10/2020   Lab Results  Component Value Date   WBC 6.4 09/10/2020   HGB 13.2 09/10/2020   HCT 40.8 09/10/2020   MCV 81 09/10/2020   PLT 259 09/10/2020   Lab Results  Component Value Date   IRON 31 (L) 09/11/2015   TIBC 287 09/11/2015   FERRITIN 56 09/11/2015   Attestation Statements:   Reviewed by clinician on day of visit: allergies, medications, problem list, medical history, surgical history, family history, social history, and previous encounter notes.   Wilhemena Durie, am acting as transcriptionist for [employer], PA-C.  I have reviewed the above documentation for accuracy and completeness, and I agree with the above. Abby Potash, PA-C

## 2020-09-23 ENCOUNTER — Other Ambulatory Visit: Payer: Self-pay

## 2020-09-23 ENCOUNTER — Ambulatory Visit (HOSPITAL_COMMUNITY)
Admission: RE | Admit: 2020-09-23 | Discharge: 2020-09-23 | Disposition: A | Discharge status: Home / Self Care | Payer: BC Managed Care – PPO | Source: Ambulatory Visit | Attending: Cardiovascular Disease | Admitting: Cardiovascular Disease

## 2020-09-23 ENCOUNTER — Encounter (HOSPITAL_COMMUNITY): Payer: BC Managed Care – PPO

## 2020-09-23 ENCOUNTER — Telehealth: Payer: Self-pay | Admitting: Family Medicine

## 2020-09-23 ENCOUNTER — Ambulatory Visit
Admission: RE | Admit: 2020-09-23 | Discharge: 2020-09-23 | Disposition: A | Discharge status: Home / Self Care | Payer: BC Managed Care – PPO | Source: Ambulatory Visit

## 2020-09-23 ENCOUNTER — Ambulatory Visit: Payer: BC Managed Care – PPO | Admitting: Family Medicine

## 2020-09-23 VITALS — BP 152/82 | HR 86 | Temp 97.8°F | Resp 18

## 2020-09-23 DIAGNOSIS — M1990 Unspecified osteoarthritis, unspecified site: Secondary | ICD-10-CM | POA: Insufficient documentation

## 2020-09-23 DIAGNOSIS — M7989 Other specified soft tissue disorders: Secondary | ICD-10-CM

## 2020-09-23 DIAGNOSIS — E669 Obesity, unspecified: Secondary | ICD-10-CM | POA: Diagnosis not present

## 2020-09-23 DIAGNOSIS — M79661 Pain in right lower leg: Secondary | ICD-10-CM

## 2020-09-23 DIAGNOSIS — M79669 Pain in unspecified lower leg: Secondary | ICD-10-CM | POA: Insufficient documentation

## 2020-09-23 NOTE — ED Triage Notes (Signed)
Pt is present today with right leg swelling and pain. Pt states that the swelling and pain started yesterday. Pt is concerned about a blot clot

## 2020-09-23 NOTE — Discharge Instructions (Addendum)
[facility] Radiology department for vascular study at 2:00 pm. Arrive 10 minutes early prior to appointment for check in. [facility]  [address], [place], [place] [address] Enter through [employer] and request radiology department. You will be contacted with the results of your study. If the study is negative, follow-up with orthopedics [facility] or your Sport medicine specialist.

## 2020-09-23 NOTE — ED Provider Notes (Signed)
[facility]    CSN: [id number] Arrival date & time: 09/23/20  8756      History   Chief Complaint Chief Complaint  Patient presents with  . Leg Swelling  . Leg Pain    HPI Valerie Carter is a 60 y.o. female.   HPI  Patient with a medical history significant for obesity, bilateral knee osteoarthritis, and type 2 diabetes presents for evaluation of acute onset right lower leg swelling and mid lateral calf pain x 1 day. No injury. Pain developed abruptly on yesterday and swelling has gradually worsened and extended to the lower aspect of right lower extremity. She is concerned for DVT. No history of DVT. Risk factors include: Morbid obesity and venous varicosities. No recent travel, no known injury, no prior history of varicosities, and no active neoplasms.   Past Medical History:  Diagnosis Date  . Anemia   . Back pain   . Back pain   . Carpal tunnel syndrome    while driving both hands  . Diabetes mellitus without complication ([place])    type 2  . DJD (degenerative joint disease)    both knees right worse than left  . Headache   . Hyperlipidemia   . Hypertension   . Intramural leiomyoma of uterus   . Joint pain   . Knee pain   . Lactose intolerance   . Lower extremity edema   . Morbid obesity with BMI of 50.0-59.9, adult ([place])   . Osteoarthritis   . Vitamin D deficiency     Patient Active Problem List   Diagnosis Date Noted  . Rotator cuff impingement syndrome of left shoulder 01/07/2019  . Sciatica of right side 05/24/2018  . Constipation 03/16/2018  . 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 Intramural leiomyoma of uterus 01/27/2016  . Healthcare maintenance 02/06/2015  . Mild depression ([place]) 09/25/2014  . Vitamin D deficiency 10/19/2013  . Seasonal allergies 09/07/2011  . Arthritis of knee, degenerative 10/29/2010  . Diet-controlled diabetes mellitus ([place]) 05/20/2010  . Low back pain 01/29/2010  .  Hyperlipidemia LDL goal <70 10/01/2008  . Obesity 10/24/2006  . HYPERTENSION, BENIGN ESSENTIAL 09/26/2006    Past Surgical History:  Procedure Laterality Date  . KNEE SURGERY Right 1999   torn cartlidge repair  . LAPAROSCOPIC GASTRIC SLEEVE RESECTION N/A 06/07/2017   Procedure: LAPAROSCOPIC GASTRIC SLEEVE RESECTION, UPPER ENDOSCOPY;  Surgeon: Kieth Brightly, Arta Bruce, MD;  Location: [facility];  Service: General;  Laterality: N/A;    OB History    Gravida  5   Para  0   Term  0   Preterm  0   AB  5   Living  0     SAB  5   IAB      Ectopic      Multiple      Live Births               Home Medications    Prior to Admission medications   Medication Sig Start Date End Date Taking? Authorizing Provider  HYDROcodone-acetaminophen [facility]) 10-325 MG tablet Take one tablet at bedtime Sunday through Friday with additional doses on the weekend as directed 09/16/20   Dickie La, MD  OZEMPIC, 0.25 OR 0.5 MG/DOSE, 2 MG/1.5ML SOPN SMARTSIG:0.25 Milligram(s) SUB-Q Once a Week 09/19/20   [provider]  Vitamin D, Ergocalciferol, (DRISDOL) 1.25 MG (50000 UNIT) CAPS capsule Take 1 capsule (50,000 Units total) by mouth every 7 (seven)  days. 09/16/20   Abby Potash, PA-C    Family History Family History  Problem Relation Age of Onset  . Diabetes Sister   . Hypertension Sister   . Diabetes Maternal Aunt   . Breast cancer Maternal Aunt 29  . Hypertension Mother   . Diabetes Father     Social History Social History   Tobacco Use  . Smoking status: Never Smoker  . Smokeless tobacco: Never Used  Vaping Use  . Vaping Use: Never used  Substance Use Topics  . Alcohol use: No    Alcohol/week: 0.0 standard drinks  . Drug use: No     Allergies   Patient has no known allergies.   Review of Systems Review of Systems Pertinent negatives listed in HPI   Physical Exam Triage Vital Signs ED Triage Vitals  Enc Vitals Group     BP 09/23/20 0950 (!) 152/82      Pulse Rate 09/23/20 0950 86     Resp 09/23/20 0950 18     Temp 09/23/20 0950 97.8 F (36.6 C)     Temp Source 09/23/20 0950 Oral     SpO2 09/23/20 0950 98 %     Weight --      Height --      Head Circumference --      Peak Flow --      Pain Score 09/23/20 0949 4     Pain Loc --      Pain Edu? --      Excl. in [place]? --    No data found.  Updated Vital Signs BP (!) 152/82 (BP Location: Left Wrist)   Pulse 86   Temp 97.8 F (36.6 C) (Oral)   Resp 18   SpO2 98%   Visual Acuity Right Eye Distance:   Left Eye Distance:   Bilateral Distance:    Right Eye Near:   Left Eye Near:    Bilateral Near:     Physical Exam Constitutional:      Appearance: She is obese.  Cardiovascular:     Rate and Rhythm: Normal rate and regular rhythm.  Pulmonary:     Effort: Pulmonary effort is normal.     Breath sounds: Normal breath sounds.  Musculoskeletal:     Right lower leg: Edema present.     Left lower leg: Edema present.       Legs:     Comments: RLE > LLE  Skin:    Capillary Refill: Capillary refill takes less than 2 seconds.  Neurological:     General: No focal deficit present.     Mental Status: She is alert.  Psychiatric:        Mood and Affect: Mood normal.        Behavior: Behavior normal.        Thought Content: Thought content normal.        Judgment: Judgment normal.      UC Treatments / Results  Labs (all labs ordered are listed, but only abnormal results are displayed) Labs Reviewed - No data to display  EKG   Radiology No results found.  Procedures Procedures (including critical care time)  Medications Ordered in UC Medications - No data to display  Initial Impression / Assessment and Plan / UC Course  I have reviewed the triage vital signs and the nursing notes.  Pertinent labs & imaging results that were available during my care of the patient were reviewed by me and considered in my medical decision  making (see chart for details).   Patient  presents today with unilateral right leg tenderness and swelling, concern for DVT versus phlebitis.  Well score today is 2.  DVT study pending at [facility].  Patient advised we will follow-up with results of DVT study.  If DVT study is negative recommend follow-up with orthopedic and/or return to sports medicine.   Final Clinical Impressions(s) / UC Diagnoses   Final diagnoses:  Pain and swelling of right lower leg     Discharge Instructions     [facility] Radiology department for vascular study at 2:00 pm. Arrive 10 minutes early prior to appointment for check in. [facility]  [address], [place], [place] [id number] Enter through [employer] and request radiology department. You will be contacted with the results of your study. If the study is negative, follow-up with orthopedics [facility] or your Sport medicine specialist.   ED Prescriptions    None     PDMP not reviewed this encounter.   Scot Jun, FNP 09/23/20 1105

## 2020-09-23 NOTE — Telephone Encounter (Signed)
Notify patient DVT study is negative.  She should follow-up with either orthopedics or her current sports medicine provider for further work-up and evaluation of source of leg pain.

## 2020-09-23 NOTE — Telephone Encounter (Signed)
Spoke with patient regarding negative results and to f/u for further evaluation with orthopedics or sports medicine. Pt understood and had no further questions or concerns.

## 2020-09-23 NOTE — CV Procedure (Signed)
RLE Venous duplex completed. Molli Barrows, FNP called with preliminary results at 1422.  Results can be found under chart review under CV PROC. 09/23/2020 2:41 PM Kym Scannell RVT, RDMS

## 2020-09-26 ENCOUNTER — Ambulatory Visit: Payer: BC Managed Care – PPO

## 2020-09-28 NOTE — Chronic Care Management (AMB) (Addendum)
Care Management    RN Visit Note  09/28/2020 Name: Valerie Carter MRN: [id number] DOB: 1961/01/09  Subjective: Valerie Carter is a 60 y.o. year old female who is a primary care patient of Simmons-Robinson, [place], MD. The care management team was consulted for assistance with disease management and care coordination needs.    Engaged with patient by telephone for follow up visit in response to provider referral for case management and/or care coordination services.   Consent to Services:   Ms. Wilhelmi was given information about Care Management services today including:  Care Management services includes personalized support from designated clinical staff supervised by her physician, including individualized plan of care and coordination with other care providers 24/7 contact phone numbers for assistance for urgent and routine care needs. The patient may stop case management services at any time by phone call to the office staff.  Patient agreed to services and consent obtained.    Assessment:  Patient is currently using samples of ozempic for weight loss. . See Care Plan below for interventions and patient self-care actives. Follow up Plan: RNCM will remain available for 60 days.  If no further needs are assessed at this time RNCM will be removed from care team. : Review of patient past medical history, allergies, medications, health status, including review of consultants reports, laboratory and other test data, was performed as part of comprehensive evaluation and provision of chronic care management services.   SDOH (Social Determinants of Health) assessments and interventions performed:     Care Plan  No Known Allergies  Outpatient Encounter Medications as of 09/26/2020  Medication Sig   HYDROcodone-acetaminophen (NORCO) 10-325 MG tablet Take one tablet at bedtime Sunday through Friday with additional doses on the weekend as directed   OZEMPIC, 0.25 OR 0.5 MG/DOSE, 2  MG/1.5ML SOPN SMARTSIG:0.25 Milligram(s) SUB-Q Once a Week   Vitamin D, Ergocalciferol, (DRISDOL) 1.25 MG (50000 UNIT) CAPS capsule Take 1 capsule (50,000 Units total) by mouth every 7 (seven) days.   No facility-administered encounter medications on file as of 09/26/2020.    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   Intramural leiomyoma of uterus 01/27/2016   Healthcare maintenance 02/06/2015   Mild depression ([place]) 09/25/2014   Vitamin D deficiency 10/19/2013   Seasonal allergies 09/07/2011   Arthritis of knee, degenerative 10/29/2010   Diet-controlled diabetes mellitus ([place]) 05/20/2010   Low back pain 01/29/2010   Hyperlipidemia LDL goal <70 10/01/2008   Obesity 10/24/2006   HYPERTENSION, BENIGN ESSENTIAL 09/26/2006    Conditions to be addressed/monitored:  Weight Management (Obesity)  Care Plan : RN Case Management  Updates made by Lazaro Arms, RN since 09/28/2020 12:00 AM   Problem: Weight Management (Obesity)    Goal: Patient will work to Achieve weight loss goal   Start Date: 09/10/2020  Expected End Date: 11/25/2020  Priority: High  Note:   Current Barriers:  Care Coordination needs related to Diabetes medication used for weight loss in a patient with BMI greater than 40 : obesity class III-RNCM was sent a referral from weight management to help the patient with a medication Ozempic.  This medication will be  used to help her with weight loss.  The insurance states that it will cost her 834.97  Nurse Case Manager Clinical Goal(s):  patient will verbalize understanding of plan for obtaining medication to help with weight loss patient will work with Pharmacy to address needs related to Medication Assitance  Interventions:  1:1 collaboration with Eulis Foster, MD regarding development and update of comprehensive plan of care as evidenced by provider attestation and co-signature Inter-disciplinary care team collaboration (see longitudinal plan of care) Evaluation of current treatment plan related to office note on and patient's adherence to plan as established by provider. Reviewed medications with patient  Pharmacy referral for medication sent to Marzetta Merino via staff message as preferred route of communication Discussed plans with patient for ongoing care management follow up and provided patient with direct contact information for care management team Patient will be mailed a diabetes packet and High Blood pressure booklet Consider referral to weight-loss program - [facility]. Last visit 09/02/20 Spoke with the patient she states that she talked with Pharmacy and she received samples of Ozempic that will last for 2 months and then a reevaluation. She will also monitor her food intake.    Patient Goals/Self-Care Activities Over the next 30 days, patient will: Patient will self administer medications as prescribed Patient will attend all scheduled provider appointments Patient will call pharmacy for medication refills Patient will continue to perform ADL's independently Patient will continue to perform IADL's independently Patient will call provider office for new concerns or questions      Follow Up Plan: RNCM will remain available for 60 days.  If no further needs are assessed at this time RNCM will be removed from care team.             Lazaro Arms RN, BSN, [facility] Care Management Coordinator [place] Phone: [phone] I Fax: [phone]   .  I have reviewed this visit and agree with the documentation.   Eulis Foster, MD [place], PGY-2 [phone]

## 2020-09-28 NOTE — Patient Instructions (Signed)
Visit Information  Ms. Brinkmeier  it was nice speaking with you. Please call me directly [phone] if you have questions about the goals we discussed.  Goals Addressed            This Visit's Progress   . Achieve a Healthy Weight-Obesity       Timeframe:  Long-Range Goal Priority:  High Start Date:        09/10/20                     Expected End Date:     12/25/20          - drink 6 to 8 glasses of water each day - join a weight loss program - prepare main meal at home 3 to 5 days each week - set a realistic goal    Why is this important?    When you are ready to manage your weight, have a plan and have set a goal, it is time to take action.   Taking small steps to change how you eat and exercise is a good place to start.    Notes:        The patient verbalized understanding of instructions, educational materials, and care plan provided today and declined offer to receive copy of patient instructions, educational materials, and care plan.   Follow up Plan: RNCM will remain available for 60 days.  If no further needs are assessed at this time [facility] will be removed from care team.  Lazaro Arms, RN  [phone]

## 2020-09-29 ENCOUNTER — Ambulatory Visit: Payer: BC Managed Care – PPO

## 2020-09-30 ENCOUNTER — Ambulatory Visit (INDEPENDENT_AMBULATORY_CARE_PROVIDER_SITE_OTHER): Payer: BC Managed Care – PPO | Admitting: Physician Assistant

## 2020-10-13 ENCOUNTER — Ambulatory Visit (INDEPENDENT_AMBULATORY_CARE_PROVIDER_SITE_OTHER): Payer: BC Managed Care – PPO | Admitting: Family Medicine

## 2020-10-13 ENCOUNTER — Other Ambulatory Visit: Payer: Self-pay

## 2020-10-13 VITALS — BP 145/85 | HR 100 | Temp 97.9°F | Ht 64.0 in | Wt 332.0 lb

## 2020-10-13 DIAGNOSIS — I152 Hypertension secondary to endocrine disorders: Secondary | ICD-10-CM

## 2020-10-13 DIAGNOSIS — E1159 Type 2 diabetes mellitus with other circulatory complications: Secondary | ICD-10-CM | POA: Diagnosis not present

## 2020-10-13 DIAGNOSIS — E1169 Type 2 diabetes mellitus with other specified complication: Secondary | ICD-10-CM

## 2020-10-13 DIAGNOSIS — Z9189 Other specified personal risk factors, not elsewhere classified: Secondary | ICD-10-CM

## 2020-10-13 DIAGNOSIS — M25561 Pain in right knee: Secondary | ICD-10-CM | POA: Diagnosis not present

## 2020-10-13 DIAGNOSIS — Z6841 Body Mass Index (BMI) 40.0 and over, adult: Secondary | ICD-10-CM

## 2020-10-13 DIAGNOSIS — M25562 Pain in left knee: Secondary | ICD-10-CM

## 2020-10-13 DIAGNOSIS — G8929 Other chronic pain: Secondary | ICD-10-CM

## 2020-10-14 MED ORDER — OZEMPIC (0.25 OR 0.5 MG/DOSE) 2 MG/1.5ML ~~LOC~~ SOPN: 0.5000 mg | PEN_INJECTOR | SUBCUTANEOUS | 0 refills | Status: DC

## 2020-10-14 NOTE — Progress Notes (Signed)
Chief Complaint:   OBESITY Valerie Carter is here to discuss her progress with her obesity treatment plan along with follow-up of her obesity related diagnoses.   Today's visit was #: 4 Starting weight: 336 lbs Starting date: 08/19/2020 Today's weight: 332 lbs Today's date: 10/13/2020 Total lbs lost to date: 4 lbs Body mass index is 56.99 kg/m.  Total weight loss percentage to date: -1.19%  Interim History:  Valerie Carter is drinking 100 ounces of water per day. Current Meal Plan: keeping a food journal and adhering to recommended goals of 1200-1500 calories and 90 grams of protein for 70% of the time.  Current Exercise Plan: Cardio for 15 minutes 7 times per week. Current Anti-Obesity Medications: Ozempic 0.25 mg subcutaneously weekly. Side effects: None.  Assessment/Plan:   1. Type 2 diabetes mellitus with other specified complication, without long-term current use of insulin (HCC) Diabetes Mellitus: Not at goal. Medication: Ozempic 0.25 mg subcutaneously weekly. Issues reviewed: blood sugar goals, complications of diabetes mellitus, hypoglycemia prevention and treatment, exercise, and nutrition.   Plan:  Increase Ozempic to 0.5 mg subcutaneously weekly.  She will continue to focus on protein-rich, low simple carbohydrate foods. We reviewed the importance of hydration, regular exercise for stress reduction, and restorative sleep.   Lab Results  Component Value Date   HGBA1C 6.9 (H) 09/10/2020   HGBA1C 6.0 03/28/2020   HGBA1C 5.5 02/10/2018   Lab Results  Component Value Date   MICROALBUR 0.8 02/06/2015   LDLCALC 132 (H) 09/10/2020   CREATININE 0.79 09/10/2020   - Increase Semaglutide,0.25 or 0.5MG /DOS, (OZEMPIC, 0.25 OR 0.5 MG/DOSE,) 2 MG/1.5ML SOPN; Inject 0.5 mg into the skin once a week.  Dispense: 1.5 mL; Refill: 0  2. Hypertension associated with type 2 diabetes mellitus (HCC) Elevated.  Medications: None.   Plan: Avoid buying foods that are: processed, frozen, or  prepackaged to avoid excess salt. We will continue to monitor closely alongside her PCP and/or Specialist.  Regular follow up with PCP and specialists was also encouraged.   BP Readings from Last 3 Encounters:  10/13/20 (!) 145/85  09/23/20 (!) 152/82  09/16/20 121/77   Lab Results  Component Value Date   CREATININE 0.79 09/10/2020   3. Chronic pain of both knees Also with sciatica.  She takes hydrocodone as needed for pain. We will continue to monitor symptoms as they relate to her weight loss journey.  4. At risk for heart disease Due to Valerie Carter's current state of health and medical condition(s), she is at a higher risk for heart disease.  This puts the patient at much greater risk to subsequently develop cardiopulmonary conditions that can significantly affect patient's quality of life in a negative manner.    At least 9 minutes were spent on counseling Valerie Carter about these concerns today. Evidence-based interventions for health behavior change were utilized today including the discussion of self monitoring techniques, problem-solving barriers, and SMART goal setting techniques.  Specifically, regarding patient's less desirable eating habits and patterns, we employed the technique of small changes when Valerie Carter has not been able to fully commit to her prudent nutritional plan.  5. Obesity, current BMI 57.1  Course: Valerie Carter is currently in the action stage of change. As such, her goal is to continue with weight loss efforts.   Nutrition goals: She has agreed to the Category 2 Plan.   Exercise goals: Add strength training, chair exercises.  Behavioral modification strategies: increasing lean protein intake, decreasing simple carbohydrates, increasing vegetables and increasing water intake.  Valerie Carter has agreed to follow-up with our clinic in 4 weeks. She was informed of the importance of frequent follow-up visits to maximize her success with intensive lifestyle modifications for her multiple  health conditions.   Objective:   Blood pressure (!) 145/85, pulse 100, temperature 97.9 F (36.6 C), temperature source Oral, height 5\' 4"  (1.626 m), weight (!) 332 lb (150.6 kg), SpO2 98 %. Body mass index is 56.99 kg/m.  General: Cooperative, alert, well developed, in no acute distress. HEENT: Conjunctivae and lids unremarkable. Cardiovascular: Regular rhythm.  Lungs: Normal work of breathing. Neurologic: No focal deficits.   Lab Results  Component Value Date   CREATININE 0.79 09/10/2020   BUN 17 09/10/2020   NA 139 09/10/2020   K 4.1 09/10/2020   CL 102 09/10/2020   CO2 21 09/10/2020   Lab Results  Component Value Date   ALT 21 09/10/2020   AST 30 09/10/2020   ALKPHOS 102 09/10/2020   BILITOT 0.7 09/10/2020   Lab Results  Component Value Date   HGBA1C 6.9 (H) 09/10/2020   HGBA1C 6.0 03/28/2020   HGBA1C 5.5 02/10/2018   HGBA1C 6.7 05/02/2017   HGBA1C 9.6 01/28/2017   Lab Results  Component Value Date   INSULIN 14.8 09/10/2020   Lab Results  Component Value Date   TSH 0.872 09/10/2020   Lab Results  Component Value Date   CHOL 212 (H) 09/10/2020   HDL 70 09/10/2020   LDLCALC 132 (H) 09/10/2020   LDLDIRECT 114 (H) 09/07/2011   TRIG 59 09/10/2020   CHOLHDL 3.0 09/10/2020   Lab Results  Component Value Date   WBC 6.4 09/10/2020   HGB 13.2 09/10/2020   HCT 40.8 09/10/2020   MCV 81 09/10/2020   PLT 259 09/10/2020   Lab Results  Component Value Date   IRON 31 (L) 09/11/2015   TIBC 287 09/11/2015   FERRITIN 56 09/11/2015   Attestation Statements:   Reviewed by clinician on day of visit: allergies, medications, problem list, medical history, surgical history, family history, social history, and previous encounter notes.  I, Water quality scientist, CMA, am acting as transcriptionist for Briscoe Deutscher, DO  I have reviewed the above documentation for accuracy and completeness, and I agree with the above. Briscoe Deutscher, DO

## 2020-10-16 ENCOUNTER — Telehealth: Payer: Self-pay | Admitting: Pharmacist

## 2020-10-16 NOTE — Telephone Encounter (Signed)
FMTS ATTENDING NOTE Hanford Lust,MD I  have reviewed their chart.  I agree with the clinical pharmacist's findings, assessment and care plan.

## 2020-10-16 NOTE — Telephone Encounter (Signed)
Called patient to discuss tolerability of Ozempic. Patient was given a sample pen on 09/17/20 and completed her fourth dose of 0.25mg  last Thursday, 4/14. Patient states she has had continued nausea that has waxed and waned. She reports still currently experiencing nausea.   Appt with Dr. Juleen China on 10/13/20 recommended patient increase her Ozempic to 0.5mg . Patient was planning to do so tomorrow. Patient states her uncle paid for her to pick up the prescription as cost was previously an issue.    Recommended patient hold off on increasing dose of Ozempic, as her likelihood of experiencing nausea will just increase as her body has yet to develop a tolerance. Discussed with patient my recommendation would be to take 0.25mg  for two more weeks and then if nausea has subsided to then increase to 0.5mg  for the next dose. If nausea has not subsided to continue to take 0.25mg  for two more doses, which would finish out the sample pen that we provided her.   Patient has follow-up with Dr. Juleen China in May.

## 2020-10-18 DIAGNOSIS — Z1211 Encounter for screening for malignant neoplasm of colon: Secondary | ICD-10-CM | POA: Diagnosis not present

## 2020-10-18 DIAGNOSIS — Z1212 Encounter for screening for malignant neoplasm of rectum: Secondary | ICD-10-CM | POA: Diagnosis not present

## 2020-10-21 ENCOUNTER — Encounter (INDEPENDENT_AMBULATORY_CARE_PROVIDER_SITE_OTHER): Payer: Self-pay | Admitting: Family Medicine

## 2020-10-21 ENCOUNTER — Telehealth: Payer: Self-pay

## 2020-10-21 DIAGNOSIS — M170 Bilateral primary osteoarthritis of knee: Secondary | ICD-10-CM

## 2020-10-21 MED ORDER — HYDROCODONE-ACETAMINOPHEN 10-325 MG PO TABS: ORAL_TABLET | ORAL | 0 refills | Status: DC

## 2020-10-21 NOTE — Telephone Encounter (Signed)
-----   Message from Valerie Carter sent at 10/20/2020  4:29 PM EDT ----- Pt is asking for refill on HYDROcodone-acetaminophen (NORCO) 10-325 MG tablet. States she is out of the medication and her pharmacy is the same.

## 2020-10-25 LAB — COLOGUARD: COLOGUARD: NEGATIVE

## 2020-11-12 ENCOUNTER — Ambulatory Visit (INDEPENDENT_AMBULATORY_CARE_PROVIDER_SITE_OTHER): Payer: BC Managed Care – PPO | Admitting: Family Medicine

## 2020-11-12 ENCOUNTER — Encounter (INDEPENDENT_AMBULATORY_CARE_PROVIDER_SITE_OTHER): Payer: Self-pay | Admitting: Family Medicine

## 2020-11-12 ENCOUNTER — Other Ambulatory Visit: Payer: Self-pay

## 2020-11-12 VITALS — BP 143/83 | HR 83 | Temp 97.9°F | Ht 64.0 in | Wt 331.0 lb

## 2020-11-12 DIAGNOSIS — E1159 Type 2 diabetes mellitus with other circulatory complications: Secondary | ICD-10-CM | POA: Diagnosis not present

## 2020-11-12 DIAGNOSIS — F418 Other specified anxiety disorders: Secondary | ICD-10-CM

## 2020-11-12 DIAGNOSIS — I152 Hypertension secondary to endocrine disorders: Secondary | ICD-10-CM

## 2020-11-12 DIAGNOSIS — Z6841 Body Mass Index (BMI) 40.0 and over, adult: Secondary | ICD-10-CM

## 2020-11-12 DIAGNOSIS — E1169 Type 2 diabetes mellitus with other specified complication: Secondary | ICD-10-CM

## 2020-11-12 DIAGNOSIS — M25561 Pain in right knee: Secondary | ICD-10-CM

## 2020-11-12 DIAGNOSIS — Z9189 Other specified personal risk factors, not elsewhere classified: Secondary | ICD-10-CM | POA: Diagnosis not present

## 2020-11-12 DIAGNOSIS — G8929 Other chronic pain: Secondary | ICD-10-CM

## 2020-11-12 DIAGNOSIS — M25562 Pain in left knee: Secondary | ICD-10-CM

## 2020-11-12 MED ORDER — OZEMPIC (1 MG/DOSE) 2 MG/1.5ML ~~LOC~~ SOPN: 1.0000 mg | PEN_INJECTOR | SUBCUTANEOUS | 1 refills | Status: DC

## 2020-11-12 NOTE — Progress Notes (Signed)
Chief Complaint:   OBESITY Valerie Carter is here to discuss her progress with her obesity treatment plan along with follow-up of her obesity related diagnoses.   Today's visit was #: 5 Starting weight: 336 lbs Starting date: 08/19/2020 Total lbs lost to date: 5 lbs Total weight loss percentage to date: -1.49%  Interim History: Nataliya took 2 doses of Ozempic 0.5 mg.  She says she is still very busy and the medication has decreased her appetite.  School is out now.  She says her sleep is poor.  She is still taking care of her aunt/uncle. Nutrition Plan: Category 2 Plan for 50% of the time. Anti-obesity medications: Ozempic 0.5 mg weekly. Reported side effects: None. Activity: Increased walking.  Assessment/Plan:   1. Type 2 diabetes mellitus with other specified complication, without long-term current use of insulin (HCC) Diabetes Mellitus: Not at goal. Medication: Ozempic 0.5 mg subcutaneously weekly. Issues reviewed: blood sugar goals, complications of diabetes mellitus, hypoglycemia prevention and treatment, exercise, and nutrition.   Plan:  Increase Ozempic to 1 mg subcutaneously weekly.  The patient will continue to focus on protein-rich, low simple carbohydrate foods. We reviewed the importance of hydration, regular exercise for stress reduction, and restorative sleep.   Lab Results  Component Value Date   HGBA1C 6.9 (H) 09/10/2020   HGBA1C 6.0 03/28/2020   HGBA1C 5.5 02/10/2018   Lab Results  Component Value Date   MICROALBUR 0.8 02/06/2015   LDLCALC 132 (H) 09/10/2020   CREATININE 0.79 09/10/2020   - Increase Semaglutide 1 MG/DOSE, (OZEMPIC, 1 MG/DOSE,) 4 MG/3ML SOPN; Inject 1 mg into the skin once a week.  Dispense: 3 mL; Refill: 1  2. Hypertension associated with type 2 diabetes mellitus ([place]) Not at goal. Medications: None.   Plan: Avoid buying foods that are: processed, frozen, or prepackaged to avoid excess salt. We will watch for signs of hypotension as she  continues lifestyle modifications.  BP Readings from Last 3 Encounters:  11/12/20 (!) 143/83  10/13/20 (!) 145/85  09/23/20 (!) 152/82   Lab Results  Component Value Date   CREATININE 0.79 09/10/2020   3. Chronic pain of both knees Also with sciatica. She takes hydrocodone as needed for pain. We will continue to monitor symptoms as they relate to her weight loss journey.  4. Situational anxiety Reviewed the importance of healthy boundaries and self-care.  Behavior modification techniques were discussed today to help Adalynn deal with her anxiety.    5. At risk for impaired metabolic function Due to Mandee's current state of health and medical condition(s), she is at a significantly higher risk for impaired metabolic function.   At least 8 minutes was spent on counseling Marygrace about these concerns today.  This places the patient at a much greater risk to subsequently develop cardio-pulmonary conditions that can negatively affect the patient's quality of life.  I stressed the importance of reversing these risks factors.  The initial goal is to lose at least 5-10% of starting weight to help reduce risk factors.  Counseling:  Intensive lifestyle modifications discussed with Akeya as the most appropriate first line treatment.    6. Obesity, current BMI 56.8  Course: Rylea is currently in the action stage of change. As such, her goal is to continue with weight loss efforts.   Nutrition goals: She has agreed to the Category 2 Plan.   Exercise goals: As is.  Behavioral modification strategies: increasing lean protein intake, decreasing simple carbohydrates, increasing vegetables and increasing water intake.  Emiliana has agreed to follow-up with our clinic in 3-4 weeks. She was informed of the importance of frequent follow-up visits to maximize her success with intensive lifestyle modifications for her multiple health conditions.   Objective:   Blood pressure (!) 143/83, pulse 83,  temperature 97.9 F (36.6 C), temperature source Oral, height 5\' 4"  (1.626 m), weight (!) 331 lb (150.1 kg), SpO2 98 %. Body mass index is 56.82 kg/m.  General: Cooperative, alert, well developed, in no acute distress. HEENT: Conjunctivae and lids unremarkable. Cardiovascular: Regular rhythm.  Lungs: Normal work of breathing. Neurologic: No focal deficits.   Lab Results  Component Value Date   CREATININE 0.79 09/10/2020   BUN 17 09/10/2020   NA 139 09/10/2020   K 4.1 09/10/2020   CL 102 09/10/2020   CO2 21 09/10/2020   Lab Results  Component Value Date   ALT 21 09/10/2020   AST 30 09/10/2020   ALKPHOS 102 09/10/2020   BILITOT 0.7 09/10/2020   Lab Results  Component Value Date   HGBA1C 6.9 (H) 09/10/2020   HGBA1C 6.0 03/28/2020   HGBA1C 5.5 02/10/2018   HGBA1C 6.7 05/02/2017   HGBA1C 9.6 01/28/2017   Lab Results  Component Value Date   INSULIN 14.8 09/10/2020   Lab Results  Component Value Date   TSH 0.872 09/10/2020   Lab Results  Component Value Date   CHOL 212 (H) 09/10/2020   HDL 70 09/10/2020   LDLCALC 132 (H) 09/10/2020   LDLDIRECT 114 (H) 09/07/2011   TRIG 59 09/10/2020   CHOLHDL 3.0 09/10/2020   Lab Results  Component Value Date   WBC 6.4 09/10/2020   HGB 13.2 09/10/2020   HCT 40.8 09/10/2020   MCV 81 09/10/2020   PLT 259 09/10/2020   Lab Results  Component Value Date   IRON 31 (L) 09/11/2015   TIBC 287 09/11/2015   FERRITIN 56 09/11/2015   Attestation Statements:   Reviewed by clinician on day of visit: allergies, medications, problem list, medical history, surgical history, family history, social history, and previous encounter notes.  I, Water quality scientist, CMA, am acting as transcriptionist for Briscoe Deutscher, DO  I have reviewed the above documentation for accuracy and completeness, and I agree with the above. Briscoe Deutscher, DO

## 2020-11-13 MED ORDER — OZEMPIC (1 MG/DOSE) 4 MG/3ML ~~LOC~~ SOPN: 1.0000 mg | PEN_INJECTOR | SUBCUTANEOUS | 1 refills | Status: DC

## 2020-11-21 ENCOUNTER — Telehealth: Payer: Self-pay

## 2020-11-21 DIAGNOSIS — M170 Bilateral primary osteoarthritis of knee: Secondary | ICD-10-CM

## 2020-11-21 NOTE — Telephone Encounter (Signed)
-----   Message from Carolyne Littles sent at 11/21/2020  9:36 AM EDT ----- Regarding: refill request Pt called asking for a refill on HYDROcodone-acetaminophen (NORCO) 10-325 MG tablet. She is completely out of this medication.

## 2020-11-25 MED ORDER — HYDROCODONE-ACETAMINOPHEN 10-325 MG PO TABS: ORAL_TABLET | ORAL | 0 refills | Status: DC

## 2020-11-28 ENCOUNTER — Other Ambulatory Visit: Payer: Self-pay

## 2020-11-28 ENCOUNTER — Ambulatory Visit: Payer: BC Managed Care – PPO | Admitting: Family Medicine

## 2020-11-28 DIAGNOSIS — M170 Bilateral primary osteoarthritis of knee: Secondary | ICD-10-CM | POA: Diagnosis not present

## 2020-11-28 MED ORDER — METHYLPREDNISOLONE ACETATE 40 MG/ML IJ SUSP
40.0000 mg | Freq: Once | INTRAMUSCULAR | Status: AC
  Administered 2020-11-28: 40 mg via INTRA_ARTICULAR

## 2020-11-29 ENCOUNTER — Encounter: Payer: Self-pay | Admitting: Family Medicine

## 2020-11-29 MED ORDER — HYDROCODONE-ACETAMINOPHEN 10-325 MG PO TABS: ORAL_TABLET | ORAL | 0 refills | Status: DC

## 2020-11-29 NOTE — Progress Notes (Signed)
  Valerie Carter - 60 y.o. 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 Not sure if her knee will hold out Weight has kept her from having surgery She is frustrated with that but has started Ozempic and has lost 10 pounds. She is hopeful. 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 GEN WD WN NAD  Vitals reviewed BMI >56 Right knee;;lacks full extension by 10 degrees. Normal muscle bulk and tone bilateral quads. No erythema or lesions. Medial and lateral joint line ttp. No effusion Difficulty rising from chair and needs cane for that and walking Gait even with cane is hesitant and antalgic.ninj  PROCEDURE: INJECTION: Patient was given informed consent, signed copy in the chart. Appropriate time out was taken. Area prepped and draped in usual sterile fashion. Ethyl chloride was  used for local anesthesia. A 21 gauge 1 1/2 inch needle was used.. 1 cc of methylprednisolone 40 mg/ml plus  4 cc of 1% lidocaine without epinephrine was injected into the right knee  using a(n) anteromedial  approach.   The patient tolerated the procedure well. There were no complications. Post procedure instructions were given. ASSESSMENT & PLAN:  See problem based charting & AVS for pt instructions. Arthritis of knee, degenerative Right knee getting a lot worse re stiffness and ROM by my exam CSI today   Obesity BMI > 50 applauded her weight loss. Hopefully semaglutide will give her added benefit of decreasing insulin resistance and subsequent weight  loss.

## 2020-11-29 NOTE — Assessment & Plan Note (Signed)
BMI > 50 applauded her weight loss. Hopefully semaglutide will give her added benefit of decreasing insulin resistance and subsequent weight loss.

## 2020-11-29 NOTE — Assessment & Plan Note (Signed)
>>  ASSESSMENT AND PLAN FOR ARTHRITIS OF KNEE, DEGENERATIVE WRITTEN ON 11/29/2020 12:31 PM BY Jaylianna Tatlock L, MD  Right knee getting a lot worse re stiffness and ROM by my exam CSI today

## 2020-11-29 NOTE — Assessment & Plan Note (Signed)
Right knee getting a lot worse re stiffness and ROM by my exam CSI today

## 2020-12-16 ENCOUNTER — Encounter (INDEPENDENT_AMBULATORY_CARE_PROVIDER_SITE_OTHER): Payer: Self-pay

## 2020-12-16 ENCOUNTER — Ambulatory Visit (INDEPENDENT_AMBULATORY_CARE_PROVIDER_SITE_OTHER): Payer: BC Managed Care – PPO | Admitting: Family Medicine

## 2020-12-22 ENCOUNTER — Other Ambulatory Visit: Payer: Self-pay

## 2020-12-22 ENCOUNTER — Encounter (INDEPENDENT_AMBULATORY_CARE_PROVIDER_SITE_OTHER): Payer: Self-pay | Admitting: Family Medicine

## 2020-12-22 ENCOUNTER — Ambulatory Visit (INDEPENDENT_AMBULATORY_CARE_PROVIDER_SITE_OTHER): Payer: BC Managed Care – PPO | Admitting: Family Medicine

## 2020-12-22 VITALS — BP 163/98 | HR 94 | Temp 98.1°F | Ht 64.0 in | Wt 325.0 lb

## 2020-12-22 DIAGNOSIS — E559 Vitamin D deficiency, unspecified: Secondary | ICD-10-CM

## 2020-12-22 DIAGNOSIS — M25562 Pain in left knee: Secondary | ICD-10-CM

## 2020-12-22 DIAGNOSIS — Z6841 Body Mass Index (BMI) 40.0 and over, adult: Secondary | ICD-10-CM

## 2020-12-22 DIAGNOSIS — M25561 Pain in right knee: Secondary | ICD-10-CM

## 2020-12-22 DIAGNOSIS — Z9189 Other specified personal risk factors, not elsewhere classified: Secondary | ICD-10-CM | POA: Diagnosis not present

## 2020-12-22 DIAGNOSIS — E1169 Type 2 diabetes mellitus with other specified complication: Secondary | ICD-10-CM

## 2020-12-22 DIAGNOSIS — G8929 Other chronic pain: Secondary | ICD-10-CM

## 2020-12-22 MED ORDER — OZEMPIC (1 MG/DOSE) 4 MG/3ML ~~LOC~~ SOPN: 1.0000 mg | PEN_INJECTOR | SUBCUTANEOUS | 0 refills | Status: DC

## 2020-12-22 MED ORDER — VITAMIN D (ERGOCALCIFEROL) 1.25 MG (50000 UNIT) PO CAPS: 50000.0000 [IU] | ORAL_CAPSULE | ORAL | 0 refills | Status: DC

## 2020-12-22 NOTE — Progress Notes (Signed)
Chief Complaint:   OBESITY Valerie Carter is here to discuss her progress with her obesity treatment plan along with follow-up of her obesity related diagnoses.   Today's visit was #: 6 Starting weight: 336 lbs Starting date: 08/19/2020 Today's weight: 325 lbs Today's date: 12/22/2020 Weight change since last visit: -6 lbs Total lbs lost to date: 11 lbs Body mass index is 55.79 kg/m.  Total weight loss percentage to date: -3.27%  Interim History: Valerie Carter has 5 weeks left of vacation. She is feeling much more rested. She is walking with her dogs. She is eating one meal daily, but is supplementing with protein drinks.  Current Meal Plan: the Category 2 Plan for 40% of the time.  Current Exercise Plan: Walking for 30-35 minutes 40 times per week. Current Anti-Obesity Medications: Ozempic 1 mg weekly. Side effects: None.  Assessment/Plan:   1. Type 2 diabetes mellitus with other specified complication, without long-term current use of insulin (HCC) Diabetes Mellitus: Not at goal. Medication: Ozempic 1 mg weekly. Issues reviewed: blood sugar goals, complications of diabetes mellitus, hypoglycemia prevention and treatment, exercise, and nutrition.   Plan: We will refill Ozempic, as per below. The patient will continue to focus on protein-rich, low simple carbohydrate foods. We reviewed the importance of hydration, regular exercise for stress reduction, and restorative sleep.   Lab Results  Component Value Date   HGBA1C 6.9 (H) 09/10/2020   HGBA1C 6.0 03/28/2020   HGBA1C 5.5 02/10/2018   Lab Results  Component Value Date   MICROALBUR 0.8 02/06/2015   LDLCALC 132 (H) 09/10/2020   CREATININE 0.79 09/10/2020    - Refill Semaglutide, 1 MG/DOSE, (OZEMPIC, 1 MG/DOSE,) 4 MG/3ML SOPN; Inject 1 mg into the skin once a week.  Dispense: 9 mL; Refill: 0  2. Chronic pain of both knees, needs BMI < 40 for TKR Handicap placard paperwork was filled out today.  3. Vitamin D deficiency Not at  goal. Plan: Continue to take prescription Vitamin D @50 ,000 IU every week as prescribed.  Follow-up for routine testing of Vitamin D, at least 2-3 times per year to avoid over-replacement.  Lab Results  Component Value Date   VD25OH 41.2 09/10/2020   VD25OH 29 (L) 12/29/2015   VD25OH 15 (L) 10/18/2013   - Refill Vitamin D, Ergocalciferol, (DRISDOL) 1.25 MG (50000 UNIT) CAPS capsule; Take 1 capsule (50,000 Units total) by mouth every 7 (seven) days.  Dispense: 4 capsule; Refill: 0  4. At risk for heart disease Due to Valerie Carter's current state of health and medical condition(s), she is at a higher risk for heart disease.   This puts the patient at much greater risk to subsequently develop cardiopulmonary conditions that can significantly affect patient's quality of life in a negative manner as well.    At least 8 minutes was spent on counseling Valerie Carter about these concerns today. Initial goal is to lose at least 5-10% of starting weight to help reduce these risk factors.  We will continue to reassess these conditions on a fairly regular basis in an attempt to decrease patient's overall morbidity and mortality.  Evidence-based interventions for health behavior change were utilized today including the discussion of self monitoring techniques, problem-solving barriers and SMART goal setting techniques.  Specifically regarding patient's less desirable eating habits and patterns, we employed the technique of small changes when Valerie Carter has not been able to fully commit to her prudent nutritional plan.  5. Obesity, current BMI 55.9 Course: Nature is currently in the action stage  of change. As such, her goal is to continue with weight loss efforts.   Nutrition goals: She has agreed to the Category 2 Plan.   Exercise goals: As is  Behavioral modification strategies: increasing lean protein intake, decreasing simple carbohydrates, increasing vegetables, and increasing water intake.  Valerie Carter has agreed to  follow-up with our clinic in 4 weeks. She was informed of the importance of frequent follow-up visits to maximize her success with intensive lifestyle modifications for her multiple health conditions.   Objective:   Blood pressure (!) 163/98, pulse 94, temperature 98.1 F (36.7 C), temperature source Oral, height 5\' 4"  (1.626 m), weight (!) 325 lb (147.4 kg), SpO2 (!) 59 %. Body mass index is 55.79 kg/m.  General: Cooperative, alert, well developed, in no acute distress. HEENT: Conjunctivae and lids unremarkable. Cardiovascular: Regular rhythm.  Lungs: Normal work of breathing. Neurologic: No focal deficits.   Lab Results  Component Value Date   CREATININE 0.79 09/10/2020   BUN 17 09/10/2020   NA 139 09/10/2020   K 4.1 09/10/2020   CL 102 09/10/2020   CO2 21 09/10/2020   Lab Results  Component Value Date   ALT 21 09/10/2020   AST 30 09/10/2020   ALKPHOS 102 09/10/2020   BILITOT 0.7 09/10/2020   Lab Results  Component Value Date   HGBA1C 6.9 (H) 09/10/2020   HGBA1C 6.0 03/28/2020   HGBA1C 5.5 02/10/2018   HGBA1C 6.7 05/02/2017   HGBA1C 9.6 01/28/2017   Lab Results  Component Value Date   INSULIN 14.8 09/10/2020   Lab Results  Component Value Date   TSH 0.872 09/10/2020   Lab Results  Component Value Date   CHOL 212 (H) 09/10/2020   HDL 70 09/10/2020   LDLCALC 132 (H) 09/10/2020   LDLDIRECT 114 (H) 09/07/2011   TRIG 59 09/10/2020   CHOLHDL 3.0 09/10/2020   Lab Results  Component Value Date   VD25OH 41.2 09/10/2020   VD25OH 29 (L) 12/29/2015   VD25OH 15 (L) 10/18/2013   Lab Results  Component Value Date   WBC 6.4 09/10/2020   HGB 13.2 09/10/2020   HCT 40.8 09/10/2020   MCV 81 09/10/2020   PLT 259 09/10/2020   Lab Results  Component Value Date   IRON 31 (L) 09/11/2015   TIBC 287 09/11/2015   FERRITIN 56 09/11/2015   Attestation Statements:   Reviewed by clinician on day of visit: allergies, medications, problem list, medical history, surgical  history, family history, social history, and previous encounter notes.  Leodis Binet Friedenbach, CMA, am acting as Location manager for [employer], DO.  I have reviewed the above documentation for accuracy and completeness, and I agree with the above. Briscoe Deutscher, DO

## 2021-01-16 ENCOUNTER — Other Ambulatory Visit: Payer: Self-pay | Admitting: Family Medicine

## 2021-01-16 DIAGNOSIS — M170 Bilateral primary osteoarthritis of knee: Secondary | ICD-10-CM

## 2021-01-16 MED ORDER — HYDROCODONE-ACETAMINOPHEN 10-325 MG PO TABS: ORAL_TABLET | ORAL | 0 refills | Status: DC

## 2021-01-26 ENCOUNTER — Other Ambulatory Visit: Payer: Self-pay

## 2021-01-26 ENCOUNTER — Ambulatory Visit (INDEPENDENT_AMBULATORY_CARE_PROVIDER_SITE_OTHER): Payer: BC Managed Care – PPO | Admitting: Family Medicine

## 2021-01-26 ENCOUNTER — Encounter (INDEPENDENT_AMBULATORY_CARE_PROVIDER_SITE_OTHER): Payer: Self-pay | Admitting: Family Medicine

## 2021-01-26 VITALS — BP 157/82 | HR 90 | Temp 98.4°F | Ht 64.0 in | Wt 332.0 lb

## 2021-01-26 DIAGNOSIS — I152 Hypertension secondary to endocrine disorders: Secondary | ICD-10-CM

## 2021-01-26 DIAGNOSIS — Z6841 Body Mass Index (BMI) 40.0 and over, adult: Secondary | ICD-10-CM

## 2021-01-26 DIAGNOSIS — Z9189 Other specified personal risk factors, not elsewhere classified: Secondary | ICD-10-CM

## 2021-01-26 DIAGNOSIS — E559 Vitamin D deficiency, unspecified: Secondary | ICD-10-CM

## 2021-01-26 DIAGNOSIS — F418 Other specified anxiety disorders: Secondary | ICD-10-CM | POA: Diagnosis not present

## 2021-01-26 DIAGNOSIS — E1159 Type 2 diabetes mellitus with other circulatory complications: Secondary | ICD-10-CM | POA: Diagnosis not present

## 2021-01-26 DIAGNOSIS — E1169 Type 2 diabetes mellitus with other specified complication: Secondary | ICD-10-CM

## 2021-01-26 MED ORDER — TIRZEPATIDE 5 MG/0.5ML ~~LOC~~ SOAJ: 5.0000 mg | SUBCUTANEOUS | 1 refills | Status: DC

## 2021-01-26 MED ORDER — VITAMIN D (ERGOCALCIFEROL) 1.25 MG (50000 UNIT) PO CAPS: 50000.0000 [IU] | ORAL_CAPSULE | ORAL | 0 refills | Status: DC

## 2021-01-28 NOTE — Progress Notes (Signed)
Chief Complaint:   OBESITY Valerie Carter is here to discuss her progress with her obesity treatment plan along with follow-up of her obesity related diagnoses.   Today's visit was #: 7 Starting weight: 336 lbs Starting date: 08/19/2020 Today's weight: 332 lbs Today's date: 01/26/2021 Weight change since last visit: 7 lbs Total lbs lost to date: 4 lbs Body mass index is 56.99 kg/m.  Total weight loss percentage to date: -1.19%  Interim History:  Valerie Carter says she is ready for a restart.  She was told last week that she will need to move out of her home of 21 years in 1 month.  She is under increased stress.  She is eating one calorie-dense meal each day.  She has not been supplementing with Premier protein shakes. Plan:  Will check labs at next visit.  Current Meal Plan: practicing portion control and making smarter food choices, such as increasing vegetables and decreasing simple carbohydrates for 0% of the time.  Current Exercise Plan: None. Current Anti-Obesity Medications: Ozempic 1 mg subcutaneously weekly. Side effects: None.  Assessment/Plan:   1. Type 2 diabetes mellitus with other specified complication, without long-term current use of insulin (HCC) Diabetes Mellitus: Not at goal. Medication: Ozempic 1 mg subcutaneously weekly. Issues reviewed: blood sugar goals, complications of diabetes mellitus, hypoglycemia prevention and treatment, exercise, and nutrition.   Plan:  Stop Ozempic and start [name] 5 mg subcutaneously weekly.  The patient will continue to focus on protein-rich, low simple carbohydrate foods. We reviewed the importance of hydration, regular exercise for stress reduction, and restorative sleep.   Lab Results  Component Value Date   HGBA1C 6.9 (H) 09/10/2020   HGBA1C 6.0 03/28/2020   HGBA1C 5.5 02/10/2018   Lab Results  Component Value Date   MICROALBUR 0.8 02/06/2015   LDLCALC 132 (H) 09/10/2020   CREATININE 0.79 09/10/2020   - Start tirzepatide  [facility]) 5 MG/0.5ML Pen; Inject 5 mg into the skin once a week.  Dispense: 6 mL; Refill: 1  2. Vitamin D deficiency Improving, but not optimized.  She is taking vitamin D 50,000 IU weekly.  Plan: Continue to take prescription Vitamin D '@50'$ ,000 IU every week as prescribed.  Follow-up for routine testing of Vitamin D, at least 2-3 times per year to avoid over-replacement.  Lab Results  Component Value Date   VD25OH 41.2 09/10/2020   VD25OH 29 (L) 12/29/2015   VD25OH 15 (L) 10/18/2013   - Refill Vitamin D, Ergocalciferol, (DRISDOL) 1.25 MG (50000 UNIT) CAPS capsule; Take 1 capsule (50,000 Units total) by mouth every 7 (seven) days.  Dispense: 4 capsule; Refill: 0  3. Hypertension associated with type 2 diabetes mellitus (HCC) Elevated.  Medications: None.   Plan:  She will monitor at home.  Reports white coat hypertension.  Avoid buying foods that are: processed, frozen, or prepackaged to avoid excess salt. We will watch for signs of hypotension as she continues lifestyle modifications.  BP Readings from Last 3 Encounters:  01/26/21 (!) 157/82  12/22/20 (!) 163/98  11/28/20 (!) 180/90   Lab Results  Component Value Date   CREATININE 0.79 09/10/2020   4. Situational anxiety Worsening.  Reviewed the importance of healthy boundaries and self-care.  Behavior modification techniques were discussed today to help Valerie Carter deal with her anxiety.   5. At risk for heart disease Due to Valerie Carter's current state of health and medical condition(s), she is at a higher risk for heart disease.  This puts the patient at much greater  risk to subsequently develop cardiopulmonary conditions that can significantly affect patient's quality of life in a negative manner.    At least 8 minutes were spent on counseling Valerie Carter about these concerns today. Evidence-based interventions for health behavior change were utilized today including the discussion of self monitoring techniques, problem-solving barriers, and  SMART goal setting techniques.  Specifically, regarding patient's less desirable eating habits and patterns, we employed the technique of small changes when Valerie Carter has not been able to fully commit to her prudent nutritional plan.  6. Obesity, current BMI 57.1  Course: Valerie Carter is currently in the action stage of change. As such, her goal is to continue with weight loss efforts.   Nutrition goals: She has agreed to practicing portion control and making smarter food choices, such as increasing vegetables and decreasing simple carbohydrates.   Exercise goals:  Increase activity.  Behavioral modification strategies: increasing lean protein intake, decreasing simple carbohydrates, increasing vegetables, increasing water intake, and emotional eating strategies.  Valerie Carter has agreed to follow-up with our clinic in 4 weeks. She was informed of the importance of frequent follow-up visits to maximize her success with intensive lifestyle modifications for her multiple health conditions.   Objective:   Blood pressure (!) 157/82, pulse 90, temperature 98.4 F (36.9 C), temperature source Oral, height '5\' 4"'$  (1.626 m), weight (!) 332 lb (150.6 kg), SpO2 99 %. Body mass index is 56.99 kg/m.  General: Cooperative, alert, well developed, in no acute distress. HEENT: Conjunctivae and lids unremarkable. Cardiovascular: Regular rhythm.  Lungs: Normal work of breathing. Neurologic: No focal deficits.   Lab Results  Component Value Date   CREATININE 0.79 09/10/2020   BUN 17 09/10/2020   NA 139 09/10/2020   K 4.1 09/10/2020   CL 102 09/10/2020   CO2 21 09/10/2020   Lab Results  Component Value Date   ALT 21 09/10/2020   AST 30 09/10/2020   ALKPHOS 102 09/10/2020   BILITOT 0.7 09/10/2020   Lab Results  Component Value Date   HGBA1C 6.9 (H) 09/10/2020   HGBA1C 6.0 03/28/2020   HGBA1C 5.5 02/10/2018   HGBA1C 6.7 05/02/2017   HGBA1C 9.6 01/28/2017   Lab Results  Component Value Date   INSULIN  14.8 09/10/2020   Lab Results  Component Value Date   TSH 0.872 09/10/2020   Lab Results  Component Value Date   CHOL 212 (H) 09/10/2020   HDL 70 09/10/2020   LDLCALC 132 (H) 09/10/2020   LDLDIRECT 114 (H) 09/07/2011   TRIG 59 09/10/2020   CHOLHDL 3.0 09/10/2020   Lab Results  Component Value Date   VD25OH 41.2 09/10/2020   VD25OH 29 (L) 12/29/2015   VD25OH 15 (L) 10/18/2013   Lab Results  Component Value Date   WBC 6.4 09/10/2020   HGB 13.2 09/10/2020   HCT 40.8 09/10/2020   MCV 81 09/10/2020   PLT 259 09/10/2020   Lab Results  Component Value Date   IRON 31 (L) 09/11/2015   TIBC 287 09/11/2015   FERRITIN 56 09/11/2015   Attestation Statements:   Reviewed by clinician on day of visit: allergies, medications, problem list, medical history, surgical history, family history, social history, and previous encounter notes.  I have reviewed the above documentation for accuracy and completeness, and I agree with the above. Briscoe Deutscher, DO

## 2021-02-10 ENCOUNTER — Telehealth: Payer: Self-pay | Admitting: *Deleted

## 2021-02-10 NOTE — Chronic Care Management (AMB) (Signed)
  Care Management   Note  02/10/2021 Name: Valerie Carter MRN: [id number] DOB: Mar 01, 1961  Valerie Carter is a 60 y.o. year old female who is a primary care patient of Simmons-Robinson, Riki Sheer, MD and is actively engaged with the care management team. I reached out to Valerie Carter by phone today to assist with scheduling a follow up visit with the RN Case Manager  Follow up plan: Telephone appointment with care management team member scheduled for:02/13/21  Rebbie Lauricella  Care Guide, Embedded Care Coordination [place]  Care Management  Direct Dial: [phone]

## 2021-02-13 ENCOUNTER — Ambulatory Visit: Payer: BC Managed Care – PPO

## 2021-02-13 NOTE — Chronic Care Management (AMB) (Signed)
Chronic Care Management   CCM RN Visit Note  02/13/2021 Name: Valerie Carter MRN: [id number] DOB: 14-Aug-1960  Subjective: Valerie Carter is a 60 y.o. year old female who is a primary care patient of Simmons-Robinson, Makiera, MD. The care management team was consulted for assistance with disease management and care coordination needs.    Engaged with patient by telephone for follow up visit in response to provider referral for case management and/or care coordination services.   Consent to Services:  The patient was given information about Chronic Care Management services, agreed to services, and gave verbal consent prior to initiation of services.  Please see initial visit note for detailed documentation.   Patient agreed to services and verbal consent obtained.    Assessment:  The patient has been able to obtain a medication to help with weight loss that she feels is helping her and is afordable. . See Care Plan below for interventions and patient self-care actives. Follow up Plan: If further intervention is needed the care management team is available to follow up after a formal CCM referral is placed  and No follow up scheduled with CCM team at this time  Review of patient past medical history, allergies, medications, health status, including review of consultants reports, laboratory and other test data, was performed as part of comprehensive evaluation and provision of chronic care management services.   SDOH (Social Determinants of Health) assessments and interventions performed:    CCM Care Plan  No Known Allergies  Outpatient Encounter Medications as of 02/13/2021  Medication Sig   HYDROcodone-acetaminophen (NORCO) 10-325 MG tablet Take one tablet up to 4 times a day as directed for knee pain   tirzepatide ([name]) 5 MG/0.5ML Pen Inject 5 mg into the skin once a week.   Vitamin D, Ergocalciferol, (DRISDOL) 1.25 MG (50000 UNIT) CAPS capsule Take 1 capsule (50,000  Units total) by mouth every 7 (seven) days.   No facility-administered encounter medications on file as of 02/13/2021.    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   Intramural leiomyoma of uterus 01/27/2016   Healthcare maintenance 02/06/2015   Mild depression ([place]) 09/25/2014   Vitamin D deficiency 10/19/2013   Seasonal allergies 09/07/2011   Arthritis of knee, degenerative 10/29/2010   Diet-controlled diabetes mellitus ([place]) 05/20/2010   Low back pain 01/29/2010   Hyperlipidemia LDL goal <70 10/01/2008   Obesity 10/24/2006   HYPERTENSION, BENIGN ESSENTIAL 09/26/2006    Conditions to be addressed/monitored: Weight Management  Care Plan : RN Case Management  Updates made by Lazaro Arms, RN since 02/13/2021 12:00 AM     Problem: Weight Management (Obesity)      Goal: Patient will work to Achieve weight loss goal Completed 02/13/2021  Start Date: 09/10/2020  Expected End Date: 11/25/2020  Priority: High  Note:   Current Barriers:  Care Coordination needs related to Diabetes medication used for weight loss in a patient with BMI greater than 40 : obesity class III-RNCM was sent a referral from weight management to help the patient with a medication Ozempic.  This medication will be  used to help her with weight loss.  The insurance states that it will cost her 834.97  Nurse Case Manager Clinical Goal(s):  patient will verbalize understanding of plan for obtaining medication to help  with weight loss patient will work with Pharmacy to address needs related to Medication Assitance  Interventions:  1:1 collaboration with Eulis Foster, MD regarding development and update of comprehensive plan of care as evidenced by provider attestation and  co-signature Inter-disciplinary care team collaboration (see longitudinal plan of care) Evaluation of current treatment plan related to office note on and patient's adherence to plan as established by provider. Reviewed medications with patient  Pharmacy referral for medication sent to Marzetta Merino via staff message as preferred route of communication Discussed plans with patient for ongoing care management follow up and provided patient with direct contact information for care management team Patient will be mailed a diabetes packet and High Blood pressure booklet Consider referral to weight-loss program - [facility]. Last visit 09/02/20 Spoke with the patient today and she stated that she stopped Ozempic and has switched to a new medication Munjaro '5mg'$ .  She said that she feels it worked better and it is more affordable.   She has not had any side effects from it.  She is motivated to lose weight because she wants to have knee surgery.  Both of her knees bother her she said that they crack and are painful when she walks.  She has to use a cane for stability. The patient states that her diabetes is diet controlled and she has no issues with her blood pressure.  She said that usually when she goes to the doctor's office her blood pressure is checked after she has just walked in and it is high because of the pain, but normally her blood pressure is normal.  She has no other issues that she wants me to follow.  Today will be my last call and the patient is in agreeance   Patient Goals/Self-Care Activities Over the next 30 days, patient will: Patient will self administer medications as prescribed Patient will attend all scheduled provider appointments Patient will call pharmacy for medication refills Patient will continue to perform ADL's independently Patient will continue to perform IADL's independently Patient will call provider office for new concerns or  questions                 Lazaro Arms RN, BSN, [place] Management Coordinator [place] Phone: [phone] I Fax: [phone]

## 2021-02-13 NOTE — Patient Instructions (Signed)
Visit Information  Valerie Carter  it was nice speaking with you. Please call me directly [phone] if you have questions about the goals we discussed.   Goals Addressed             This Visit's Progress    COMPLETED: Achieve a Healthy Weight-Obesity       Timeframe:  Long-Range Goal Priority:  High Start Date:        09/10/20                     Expected End Date:     12/25/20          - drink 6 to 8 glasses of water each day - join a weight loss program - prepare main meal at home 3 to 5 days each week - set a realistic goal    Why is this important?   When you are ready to manage your weight, have a plan and have set a goal, it is time to take action.  Taking small steps to change how you eat and exercise is a good place to start.    Notes:         The patient verbalized understanding of instructions, educational materials, and care plan provided today and declined offer to receive copy of patient instructions, educational materials, and care plan.   Follow up Plan: If further intervention is needed the care management team is available to follow up after a formal CCM referral is placed  and No follow up scheduled with CCM team at this time  Lazaro Arms, RN  [phone]

## 2021-02-17 ENCOUNTER — Telehealth: Payer: Self-pay

## 2021-02-17 DIAGNOSIS — M170 Bilateral primary osteoarthritis of knee: Secondary | ICD-10-CM

## 2021-02-17 MED ORDER — HYDROCODONE-ACETAMINOPHEN 10-325 MG PO TABS: ORAL_TABLET | ORAL | 0 refills | Status: DC

## 2021-02-17 NOTE — Telephone Encounter (Signed)
-----   Message from Carolyne Littles sent at 02/17/2021  8:55 AM EDT ----- Regarding: med request Pt is asking for a refill on HYDROcodone-acetaminophen (NORCO) 10-325 MG tablet . She has about 4 left.

## 2021-02-24 ENCOUNTER — Ambulatory Visit (INDEPENDENT_AMBULATORY_CARE_PROVIDER_SITE_OTHER): Payer: BC Managed Care – PPO | Admitting: Family Medicine

## 2021-03-17 ENCOUNTER — Other Ambulatory Visit: Payer: Self-pay

## 2021-03-17 ENCOUNTER — Ambulatory Visit (INDEPENDENT_AMBULATORY_CARE_PROVIDER_SITE_OTHER): Payer: BC Managed Care – PPO | Admitting: Family Medicine

## 2021-03-17 ENCOUNTER — Encounter (INDEPENDENT_AMBULATORY_CARE_PROVIDER_SITE_OTHER): Payer: Self-pay | Admitting: Family Medicine

## 2021-03-17 VITALS — BP 143/82 | HR 81 | Temp 97.4°F | Ht 64.0 in | Wt 326.0 lb

## 2021-03-17 DIAGNOSIS — I152 Hypertension secondary to endocrine disorders: Secondary | ICD-10-CM

## 2021-03-17 DIAGNOSIS — E1165 Type 2 diabetes mellitus with hyperglycemia: Secondary | ICD-10-CM | POA: Diagnosis not present

## 2021-03-17 DIAGNOSIS — E559 Vitamin D deficiency, unspecified: Secondary | ICD-10-CM

## 2021-03-17 DIAGNOSIS — E1159 Type 2 diabetes mellitus with other circulatory complications: Secondary | ICD-10-CM

## 2021-03-17 DIAGNOSIS — Z9189 Other specified personal risk factors, not elsewhere classified: Secondary | ICD-10-CM

## 2021-03-17 DIAGNOSIS — Z6841 Body Mass Index (BMI) 40.0 and over, adult: Secondary | ICD-10-CM

## 2021-03-17 MED ORDER — VITAMIN D (ERGOCALCIFEROL) 1.25 MG (50000 UNIT) PO CAPS: 50000.0000 [IU] | ORAL_CAPSULE | ORAL | 0 refills | Status: DC

## 2021-03-17 MED ORDER — TIRZEPATIDE 5 MG/0.5ML ~~LOC~~ SOAJ: 5.0000 mg | SUBCUTANEOUS | 0 refills | Status: DC

## 2021-03-17 NOTE — Progress Notes (Signed)
Chief Complaint:   OBESITY Valerie Carter is here to discuss her progress with her obesity treatment plan along with follow-up of her obesity related diagnoses. Valerie Carter is on the Category 2 Plan and the Category 3 Plan and states she is following her eating plan approximately 65% of the time. Valerie Carter states she is not currently exercising.  Today's visit was #: 8 Starting weight: 336 lbs Starting date: 08/19/2020 Today's weight: 326 lbs Today's date: 03/17/2021 Total lbs lost to date: 10 Total lbs lost since last in-office visit: 6  Interim History: Valerie Carter enjoys that Valerie Carter is keeping her fuller longer and she feels less focused on food. She has to remember to eat while on [name]. Pt just moved so she is trying to get everything unpacked and put away. She wants to meal prep in the upcoming weeks.  Subjective:   1. Hypertension associated with type 2 diabetes mellitus (HCC) BP elevated again today. Pt denies chest pain/chest pressure/headache. She is not on meds. Pt in significant pain walking into clinic.  2. Type 2 diabetes mellitus with hyperglycemia, without long-term current use of insulin ([place]) Valerie Carter is on [name] 5 mg and denies GI side effects.  3. Vitamin D deficiency Pt denies nausea, vomiting, and muscle weakness but notes fatigue. Her last Vit D level was 41.2.  4. At risk for nausea Sweetie is at risk for nausea due to [facility].  Assessment/Plan:   1. Hypertension associated with type 2 diabetes mellitus ([place]) Valerie Carter is to take BP 3 times a week and bring readings to next OV. She is working on healthy weight loss and exercise to improve blood pressure control. We will watch for signs of hypotension as she continues her lifestyle modifications.  2. Type 2 diabetes mellitus with hyperglycemia, without long-term current use of insulin (HCC) Good blood sugar control is important to decrease the likelihood of diabetic complications such as nephropathy, neuropathy, limb  loss, blindness, coronary artery disease, and death. Intensive lifestyle modification including diet, exercise and weight loss are the first line of treatment for diabetes.   Refill- tirzepatide ([name]) 5 MG/0.5ML Pen; Inject 5 mg into the skin once a week.  Dispense: 6 mL; Refill: 0  3. Vitamin D deficiency Low Vitamin D level contributes to fatigue and are associated with obesity, breast, and colon cancer. She agrees to continue to take prescription Vitamin D 50,000 IU every week and will follow-up for routine testing of Vitamin D, at least 2-3 times per year to avoid over-replacement.  Refill- Vitamin D, Ergocalciferol, (DRISDOL) 1.25 MG (50000 UNIT) CAPS capsule; Take 1 capsule (50,000 Units total) by mouth every 7 (seven) days.  Dispense: 4 capsule; Refill: 0  4. At risk for nausea [name] was given approximately 15 minutes of nausea prevention counseling today. Valerie Carter is at risk for nausea due to her new or current medication. She was encouraged to titrate her medication slowly, make sure to stay hydrated, eat smaller portions throughout the day, and avoid high fat meals.    5. Obesity, current BMI of 56.1  Valerie Carter is currently in the action stage of change. As such, her goal is to continue with weight loss efforts. She has agreed to the Category 2 Plan and the Category 3 Plan.   Exercise goals:  As is  Behavioral modification strategies: increasing lean protein intake, no skipping meals, and meal planning and cooking strategies.  Valerie Carter has agreed to follow-up with our clinic in 3-4 weeks. She was informed of the  importance of frequent follow-up visits to maximize her success with intensive lifestyle modifications for her multiple health conditions.   Objective:   Blood pressure (!) 143/82, pulse 81, temperature (!) 97.4 F (36.3 C), height 5\' 4"  (1.626 m), weight (!) 326 lb (147.9 kg), SpO2 100 %. Body mass index is 55.96 kg/m.  General: Cooperative, alert, well  developed, in no acute distress. HEENT: Conjunctivae and lids unremarkable. Cardiovascular: Regular rhythm.  Lungs: Normal work of breathing. Neurologic: No focal deficits.   Lab Results  Component Value Date   CREATININE 0.79 09/10/2020   BUN 17 09/10/2020   NA 139 09/10/2020   K 4.1 09/10/2020   CL 102 09/10/2020   CO2 21 09/10/2020   Lab Results  Component Value Date   ALT 21 09/10/2020   AST 30 09/10/2020   ALKPHOS 102 09/10/2020   BILITOT 0.7 09/10/2020   Lab Results  Component Value Date   HGBA1C 6.9 (H) 09/10/2020   HGBA1C 6.0 03/28/2020   HGBA1C 5.5 02/10/2018   HGBA1C 6.7 05/02/2017   HGBA1C 9.6 01/28/2017   Lab Results  Component Value Date   INSULIN 14.8 09/10/2020   Lab Results  Component Value Date   TSH 0.872 09/10/2020   Lab Results  Component Value Date   CHOL 212 (H) 09/10/2020   HDL 70 09/10/2020   LDLCALC 132 (H) 09/10/2020   LDLDIRECT 114 (H) 09/07/2011   TRIG 59 09/10/2020   CHOLHDL 3.0 09/10/2020   Lab Results  Component Value Date   VD25OH 41.2 09/10/2020   VD25OH 29 (L) 12/29/2015   VD25OH 15 (L) 10/18/2013   Lab Results  Component Value Date   WBC 6.4 09/10/2020   HGB 13.2 09/10/2020   HCT 40.8 09/10/2020   MCV 81 09/10/2020   PLT 259 09/10/2020   Lab Results  Component Value Date   IRON 31 (L) 09/11/2015   TIBC 287 09/11/2015   FERRITIN 56 09/11/2015    Attestation Statements:   Reviewed by clinician on day of visit: allergies, medications, problem list, medical history, surgical history, family history, social history, and previous encounter notes.  Coral Ceo, CMA, am acting as transcriptionist for Coralie Common, MD.   I have reviewed the above documentation for accuracy and completeness, and I agree with the above. - Coralie Common, MD

## 2021-03-19 ENCOUNTER — Telehealth: Payer: Self-pay

## 2021-03-19 DIAGNOSIS — M170 Bilateral primary osteoarthritis of knee: Secondary | ICD-10-CM

## 2021-03-19 NOTE — Telephone Encounter (Signed)
-----   Message from Carolyne Littles sent at 03/19/2021  1:33 PM EDT ----- Regarding: refill request Pt is requesting refill on pain meds.

## 2021-03-20 MED ORDER — HYDROCODONE-ACETAMINOPHEN 10-325 MG PO TABS: ORAL_TABLET | ORAL | 0 refills | Status: DC

## 2021-03-24 ENCOUNTER — Encounter (INDEPENDENT_AMBULATORY_CARE_PROVIDER_SITE_OTHER): Payer: Self-pay

## 2021-03-25 ENCOUNTER — Emergency Department (HOSPITAL_COMMUNITY): Payer: BC Managed Care – PPO

## 2021-03-25 ENCOUNTER — Other Ambulatory Visit: Payer: Self-pay

## 2021-03-25 ENCOUNTER — Emergency Department (HOSPITAL_COMMUNITY)
Admission: EM | Admit: 2021-03-25 | Discharge: 2021-03-25 | Disposition: A | Discharge status: Home / Self Care | Payer: BC Managed Care – PPO | Attending: Emergency Medicine | Admitting: Emergency Medicine

## 2021-03-25 DIAGNOSIS — W1839XA Other fall on same level, initial encounter: Secondary | ICD-10-CM | POA: Insufficient documentation

## 2021-03-25 DIAGNOSIS — E119 Type 2 diabetes mellitus without complications: Secondary | ICD-10-CM | POA: Insufficient documentation

## 2021-03-25 DIAGNOSIS — R4020 Unspecified coma: Secondary | ICD-10-CM | POA: Diagnosis not present

## 2021-03-25 DIAGNOSIS — R404 Transient alteration of awareness: Secondary | ICD-10-CM | POA: Diagnosis not present

## 2021-03-25 DIAGNOSIS — I10 Essential (primary) hypertension: Secondary | ICD-10-CM | POA: Diagnosis not present

## 2021-03-25 DIAGNOSIS — N9489 Other specified conditions associated with female genital organs and menstrual cycle: Secondary | ICD-10-CM | POA: Diagnosis not present

## 2021-03-25 DIAGNOSIS — Z79899 Other long term (current) drug therapy: Secondary | ICD-10-CM | POA: Diagnosis not present

## 2021-03-25 DIAGNOSIS — Y92003 Bedroom of unspecified non-institutional (private) residence as the place of occurrence of the external cause: Secondary | ICD-10-CM | POA: Diagnosis not present

## 2021-03-25 DIAGNOSIS — R55 Syncope and collapse: Secondary | ICD-10-CM

## 2021-03-25 DIAGNOSIS — S00532A Contusion of oral cavity, initial encounter: Secondary | ICD-10-CM | POA: Insufficient documentation

## 2021-03-25 DIAGNOSIS — Y9301 Activity, walking, marching and hiking: Secondary | ICD-10-CM | POA: Insufficient documentation

## 2021-03-25 DIAGNOSIS — R569 Unspecified convulsions: Secondary | ICD-10-CM | POA: Diagnosis not present

## 2021-03-25 DIAGNOSIS — G9389 Other specified disorders of brain: Secondary | ICD-10-CM | POA: Diagnosis not present

## 2021-03-25 DIAGNOSIS — G238 Other specified degenerative diseases of basal ganglia: Secondary | ICD-10-CM | POA: Diagnosis not present

## 2021-03-25 DIAGNOSIS — M1712 Unilateral primary osteoarthritis, left knee: Secondary | ICD-10-CM | POA: Diagnosis not present

## 2021-03-25 DIAGNOSIS — S0993XA Unspecified injury of face, initial encounter: Secondary | ICD-10-CM | POA: Diagnosis not present

## 2021-03-25 DIAGNOSIS — S0990XA Unspecified injury of head, initial encounter: Secondary | ICD-10-CM | POA: Diagnosis not present

## 2021-03-25 LAB — URINALYSIS, ROUTINE W REFLEX MICROSCOPIC
Bacteria, UA: NONE SEEN
Bilirubin Urine: NEGATIVE
Glucose, UA: NEGATIVE mg/dL
Hgb urine dipstick: NEGATIVE
Ketones, ur: NEGATIVE mg/dL
Leukocytes,Ua: NEGATIVE
Nitrite: NEGATIVE
Protein, ur: 30 mg/dL — AB
Specific Gravity, Urine: 1.006 (ref 1.005–1.030)
pH: 7 (ref 5.0–8.0)

## 2021-03-25 LAB — BASIC METABOLIC PANEL
Anion gap: 12 (ref 5–15)
BUN: 15 mg/dL (ref 6–20)
CO2: 22 mmol/L (ref 22–32)
Calcium: 9.8 mg/dL (ref 8.9–10.3)
Chloride: 106 mmol/L (ref 98–111)
Creatinine, Ser: 0.8 mg/dL (ref 0.44–1.00)
GFR, Estimated: >60 mL/min (ref >60)
Glucose, Bld: 87 mg/dL (ref 70–99)
Potassium: 3.9 mmol/L (ref 3.5–5.1)
Sodium: 140 mmol/L (ref 135–145)

## 2021-03-25 LAB — I-STAT BETA HCG BLOOD, ED (MC, WL, AP ONLY): I-stat hCG, quantitative: <5 m[IU]/mL

## 2021-03-25 LAB — CBC
HCT: 45.8 % (ref 36.0–46.0)
Hemoglobin: 13.4 g/dL (ref 12.0–15.0)
MCH: 25 pg — ABNORMAL LOW (ref 26.0–34.0)
MCHC: 29.3 g/dL — ABNORMAL LOW (ref 30.0–36.0)
MCV: 85.3 fL (ref 80.0–100.0)
Platelets: 143 10*3/uL — ABNORMAL LOW (ref 150–400)
RBC: 5.37 MIL/uL — ABNORMAL HIGH (ref 3.87–5.11)
RDW: 13.8 % (ref 11.5–15.5)
WBC: 5.9 10*3/uL (ref 4.0–10.5)
nRBC: 0 % (ref 0.0–0.2)

## 2021-03-25 LAB — RAPID URINE DRUG SCREEN, HOSP PERFORMED
Amphetamines: NOT DETECTED
Barbiturates: NOT DETECTED
Benzodiazepines: NOT DETECTED
Cocaine: NOT DETECTED
Opiates: POSITIVE — AB
Tetrahydrocannabinol: NOT DETECTED

## 2021-03-25 LAB — CBG MONITORING, ED: Glucose-Capillary: 87 mg/dL (ref 70–99)

## 2021-03-25 LAB — ETHANOL: Alcohol, Ethyl (B): <10 mg/dL (ref <10)

## 2021-03-25 NOTE — Discharge Instructions (Addendum)
You have been evaluated for a passing out episode.  Please monitor your pain medication and take it sparingly as it could be contribute to your condition.  If you experience another similar episode of passing out, return to the ER for further evaluation as seizure can manifest similar.  Your blood pressure is high today, have it recheck by your doctor in the next few days.

## 2021-03-25 NOTE — ED Notes (Signed)
Patient transported to X-ray 

## 2021-03-25 NOTE — ED Provider Notes (Signed)
[facility] EMERGENCY DEPARTMENT Provider Note   CSN: [id number] Arrival date & time: 03/25/21  4259     History Chief Complaint  Patient presents with   Syncope   Post ictal    Jenina Moening is a 60 y.o. female.  The history is provided by the patient and medical records. No language interpreter was used.   60 year old female significant history of chronic back pain, diabetes, hypertension wound, obesity brought here via EMS from home for evaluation of suspect syncopal episode.  Per EMS note, when they arrived, patient had shallow and was in agonal breathing.  Patient received 1 mg of Narcan intranasally and woke up and 2 seconds later.  EMS report that patient was combative and confused appears to be postictal but without any history seizure.  Patient does take chronic opiates for pain.  Her husband who was at bedside was able to provide history.  Per husband, this morning patient was walking from the bathroom back to her bedroom and when she got to the door she stopped, sat down and fell backward with an apparent syncopal episode.  He also report that she was foaming at the mouth, was tremulous and was unresponsive for 5 to 10 minutes until EMS arrived.  He did not see her hitting her head.  Patient was confused afterward.  Patient did report she takes her usual pain medication dosage which includes 2 Percocets, and 4 ibuprofen an hour prior to the episode.  She takes it for her chronic knee pain and shoulder pain.  She denies any precipitating symptoms prior to fall.  She denies having active headache, neck pain, chest pain, trouble breathing, abdominal pain, back pain, focal numbness or focal weakness or dysuria.  No recent sickness.  She denies alcohol or tobacco abuse.  She denies any recreational drug use.  Patient denies tongue biting or urinary incontinence  Past Medical History:  Diagnosis Date   Anemia    Back pain    Back pain    Carpal tunnel syndrome     while driving both hands   Diabetes mellitus without complication ([place])    type 2   DJD (degenerative joint disease)    both knees right worse than left   Headache    Hyperlipidemia    Hypertension    Intramural leiomyoma of uterus    Joint pain    Knee pain    Lactose intolerance    Lower extremity edema    Morbid obesity with BMI of 50.0-59.9, adult ([place])    Osteoarthritis    Vitamin D deficiency     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   Intramural leiomyoma of uterus 01/27/2016   Healthcare maintenance 02/06/2015   Mild depression ([place]) 09/25/2014   Vitamin D deficiency 10/19/2013   Seasonal allergies 09/07/2011   Arthritis of knee, degenerative 10/29/2010   Diet-controlled diabetes mellitus ([place]) 05/20/2010   Low back pain 01/29/2010   Hyperlipidemia LDL goal <70 10/01/2008   Obesity 10/24/2006   HYPERTENSION, BENIGN ESSENTIAL 09/26/2006    Past Surgical History:  Procedure Laterality Date   KNEE SURGERY Right 1999   torn cartlidge repair   LAPAROSCOPIC GASTRIC SLEEVE RESECTION N/A 06/07/2017   Procedure: LAPAROSCOPIC GASTRIC SLEEVE RESECTION, UPPER ENDOSCOPY;  Surgeon: Mickeal Skinner, MD;  Location: [facility];  Service: General;  Laterality: N/A;     OB History     Gravida  5   Para  0   Term  0   Preterm  0   AB  5   Living  0      SAB  5   IAB      Ectopic      Multiple      Live Births              Family History  Problem Relation Age of Onset   Diabetes Sister    Hypertension Sister    Diabetes Maternal Aunt    Breast cancer Maternal Aunt 25   Hypertension Mother    Diabetes Father     Social History   Tobacco Use   Smoking status: Never   Smokeless tobacco: Never  Vaping Use   Vaping Use: Never used   Substance Use Topics   Alcohol use: No    Alcohol/week: 0.0 standard drinks   Drug use: No    Home Medications Prior to Admission medications   Medication Sig Start Date End Date Taking? Authorizing Provider  HYDROcodone-acetaminophen (NORCO) 10-325 MG tablet Take one tablet up to 4 times a day as directed for knee pain 03/20/21   Dickie La, MD  tirzepatide [facility]) 5 MG/0.5ML Pen Inject 5 mg into the skin once a week. 03/17/21   Laqueta Linden, MD  Vitamin D, Ergocalciferol, (DRISDOL) 1.25 MG (50000 UNIT) CAPS capsule Take 1 capsule (50,000 Units total) by mouth every 7 (seven) days. 03/17/21   Laqueta Linden, MD    Allergies    Patient has no known allergies.  Review of Systems   Review of Systems  All other systems reviewed and are negative.  Physical Exam Updated Vital Signs BP (!) 187/124 (BP Location: Right Arm)   Pulse 98   Temp 97.8 F (36.6 C) (Oral)   Resp 16   Ht 5\' 4"  (1.626 m)   Wt (!) 145.2 kg   SpO2 98%   BMI 54.93 kg/m   Physical Exam Vitals and nursing note reviewed.  Constitutional:      General: She is not in acute distress.    Appearance: She is well-developed.  HENT:     Head: Atraumatic.     Mouth/Throat:     Comments: Bruising noted to the lateral aspect of the tongue Eyes:     Extraocular Movements: Extraocular movements intact.     Conjunctiva/sclera: Conjunctivae normal.     Pupils: Pupils are equal, round, and reactive to light.  Cardiovascular:     Rate and Rhythm: Normal rate and regular rhythm.     Pulses: Normal pulses.     Heart sounds: Normal heart sounds.  Pulmonary:     Effort: Pulmonary effort is normal.  Abdominal:     Palpations: Abdomen is soft.  Musculoskeletal:     Cervical back: Neck supple.     Comments: Able to move all 4 extremities  Skin:    Findings: No rash.  Neurological:     Mental Status: She is alert and oriented to person, place, and time.     GCS: GCS eye subscore is 4. GCS verbal  subscore is 5. GCS motor subscore is 6.     Cranial Nerves: Cranial nerves are intact.     Sensory: Sensation is intact.     Motor: No weakness.  Psychiatric:  Mood and Affect: Mood normal.    ED Results / Procedures / Treatments   Labs (all labs ordered are listed, but only abnormal results are displayed) Labs Reviewed  CBC - Abnormal; Notable for the following components:      Result Value   RBC 5.37 (*)    MCH 25.0 (*)    MCHC 29.3 (*)    Platelets 143 (*)    All other components within normal limits  RAPID URINE DRUG SCREEN, HOSP PERFORMED - Abnormal; Notable for the following components:   Opiates POSITIVE (*)    All other components within normal limits  URINALYSIS, ROUTINE W REFLEX MICROSCOPIC - Abnormal; Notable for the following components:   Color, Urine COLORLESS (*)    Protein, ur 30 (*)    All other components within normal limits  BASIC METABOLIC PANEL  ETHANOL  CBG MONITORING, ED  I-STAT BETA HCG BLOOD, ED ([facility], [facility], [facility] ONLY)    EKG None ED ECG REPORT   Date: 03/25/2021  Rate: 94  Rhythm: normal sinus rhythm  QRS Axis: left  Intervals: normal  ST/T Wave abnormalities: normal  Conduction Disutrbances:none  Narrative Interpretation:   Old EKG Reviewed: unchanged  I have personally reviewed the EKG tracing and agree with the computerized printout as noted.   Radiology CT HEAD WO CONTRAST (5MM)  Result Date: 03/25/2021 CLINICAL DATA:  60 year old female with history of syncopal episode with head trauma. EXAM: CT HEAD WITHOUT CONTRAST TECHNIQUE: Contiguous axial images were obtained from the base of the skull through the vertex without intravenous contrast. COMPARISON:  No priors. FINDINGS: Brain: Physiologic calcifications in the basal ganglia incidentally noted (right greater than left). No evidence of acute infarction, hemorrhage, hydrocephalus, extra-axial collection or mass lesion/mass effect. Vascular: No hyperdense vessel or unexpected  calcification. Skull: Normal. Negative for fracture or focal lesion. Sinuses/Orbits: No acute finding. Mild multifocal mucosal thickening in the ethmoid sinuses bilaterally and left maxillary sinus, without air-fluid levels. Other: None. IMPRESSION: 1. No acute intracranial abnormalities. The appearance of the brain is normal. Electronically Signed   By: Vinnie Langton M.D.   On: 03/25/2021 08:39   DG Knee Complete 4 Views Left  Result Date: 03/25/2021 CLINICAL DATA:  pain, fall EXAM: LEFT KNEE - COMPLETE 4 VIEW COMPARISON:  None. FINDINGS: Normal alignment. No acute fracture. Tricompartmental degenerative changes most advanced in the lateral and patellofemoral compartments. Normal mineralization. The soft tissues are unremarkable. No joint effusion. IMPRESSION: No malalignment or acute fracture. Tricompartmental degenerative changes most advanced in the lateral and patellofemoral compartments. Electronically Signed   By: Albin Felling M.D.   On: 03/25/2021 08:32    Procedures Procedures   Medications Ordered in ED Medications - No data to display  ED Course  I have reviewed the triage vital signs and the nursing notes.  Pertinent labs & imaging results that were available during my care of the patient were reviewed by me and considered in my medical decision making (see chart for details).    MDM Rules/Calculators/A&P                           BP (!) 166/96   Pulse 86   Temp 98.2 F (36.8 C) (Oral)   Resp (!) 21   Ht 5\' 4"  (1.626 m)   Wt (!) 145.2 kg   SpO2 97%   BMI 54.93 kg/m   Final Clinical Impression(s) / ED Diagnoses Final diagnoses:  Syncope and collapse  Rx / DC Orders ED Discharge Orders     None      6:58 AM Patient with history of chronic pain on persistent opiate medication who had a syncopal and possible seizure episode this morning.  Was witnessed by husband.  He reported she was foaming at the mouth, and was confused after EMS arrived and patient  received 1 mg of Narcan with good response.  She did admits to taking opiate medication an hour prior to the event but denies taking more than prescribed.  She does not have any focal neurodeficit on exam.  She does have some bruising to her tongue from suspected biting.  Work-up initiated.  9:46 AM Labs are reassuring, UDS positive for opiate which is anticipated.  UA without signs of urinary tract infection, normal CBG, head CT scan unremarkable, x-ray of left knee without concerning fracture or dislocation.  EKG without concerning arrhythmia such as WPW, Brugada's, prolonged QT, heart block, atrial fibrillation, or ischemic changes.  Patient stable for discharge.   Domenic Moras, PA-C 03/26/21 1523    Lucrezia Starch, MD 03/27/21 [phone]

## 2021-03-25 NOTE — ED Notes (Signed)
Lab results given to Nurse. 

## 2021-03-25 NOTE — ED Notes (Addendum)
Cardiac Monitoring device &  IV removed, significant other at bedside assisting pt dress & tech is getting w/c for pt to use to get to her car. D/C instructions have been given along with paperwork from EDP.

## 2021-03-25 NOTE — ED Triage Notes (Signed)
Pt BIB EMS from home. Pt family called out for syncope. When fire arrived on scene pt had shallow and agonal + snoring respirations. Fire administered 1mg  of Narcan intranasal and reported pt woke up about 2 seconds later. EMS reports when pt woke up she was very combative and confused. EMS reports no seizure activity witnessed but pt seems to be post ictal. Pt follows commands and able to answer questions appropriately but is still confused - does not remember what happened. No hx of seizures , hx of HTN

## 2021-03-25 NOTE — ED Notes (Signed)
Pt ambulated with tech, tol well.

## 2021-03-25 NOTE — ED Notes (Signed)
IV started in right hand - does not pull back - phlebotomy asked to get labs

## 2021-04-02 ENCOUNTER — Ambulatory Visit: Payer: BC Managed Care – PPO

## 2021-04-08 ENCOUNTER — Encounter (INDEPENDENT_AMBULATORY_CARE_PROVIDER_SITE_OTHER): Payer: Self-pay | Admitting: Family Medicine

## 2021-04-08 ENCOUNTER — Other Ambulatory Visit: Payer: Self-pay

## 2021-04-08 ENCOUNTER — Ambulatory Visit (INDEPENDENT_AMBULATORY_CARE_PROVIDER_SITE_OTHER): Payer: BC Managed Care – PPO | Admitting: Family Medicine

## 2021-04-08 VITALS — BP 140/85 | HR 92 | Temp 98.0°F | Ht 64.0 in | Wt 320.0 lb

## 2021-04-08 DIAGNOSIS — E559 Vitamin D deficiency, unspecified: Secondary | ICD-10-CM

## 2021-04-08 DIAGNOSIS — Z9189 Other specified personal risk factors, not elsewhere classified: Secondary | ICD-10-CM

## 2021-04-08 DIAGNOSIS — E1169 Type 2 diabetes mellitus with other specified complication: Secondary | ICD-10-CM | POA: Diagnosis not present

## 2021-04-08 DIAGNOSIS — Z6841 Body Mass Index (BMI) 40.0 and over, adult: Secondary | ICD-10-CM

## 2021-04-08 DIAGNOSIS — E1165 Type 2 diabetes mellitus with hyperglycemia: Secondary | ICD-10-CM

## 2021-04-08 MED ORDER — TIRZEPATIDE 5 MG/0.5ML ~~LOC~~ SOAJ: 5.0000 mg | SUBCUTANEOUS | 0 refills | Status: DC

## 2021-04-08 MED ORDER — VITAMIN D (ERGOCALCIFEROL) 1.25 MG (50000 UNIT) PO CAPS: 50000.0000 [IU] | ORAL_CAPSULE | ORAL | 0 refills | Status: DC

## 2021-04-08 NOTE — Progress Notes (Signed)
Chief Complaint:   OBESITY Valerie Carter is here to discuss her progress with her obesity treatment plan along with follow-up of her obesity related diagnoses. [name] is on the Category 2 Plan and the Category 3 Plan and states she is following her eating plan approximately 50% of the time. [name] states she is walking for 20 minutes 3 times per week.  Today's visit was #: 9 Starting weight: 336 lbs Starting date: 08/19/2020 Today's weight: 320 lbs Today's date: 04/08/2021 Total lbs lost to date: 16 lbs Total lbs lost since last in-office visit: 6 lbs  Interim History: [name] is status post gastric sleeve in 2018. Her Nadir was 268 lbs. High weight 349 lbs.  She feels she averages about 60 grams of protein daily. She skips breakfast and sometimes dinner. She drinks a few sweet teas per week.  Subjective:   1. Type 2 diabetes mellitus without long-term current use of insulin (HCC) Valerie Carter is on [place]. Well controlled on [name]. She skips quite a few meals.   Lab Results  Component Value Date   HGBA1C 6.9 (H) 09/10/2020   HGBA1C 6.0 03/28/2020   HGBA1C 5.5 02/10/2018   Lab Results  Component Value Date   MICROALBUR 0.8 02/06/2015   LDLCALC 132 (H) 09/10/2020   CREATININE 0.80 03/25/2021   Lab Results  Component Value Date   INSULIN 14.8 09/10/2020    2. Vitamin D deficiency Valerie Carter's Vitamin D is low at 41.2. She is on weekly prescription Vitamin D.   Lab Results  Component Value Date   VD25OH 41.2 09/10/2020   VD25OH 29 (L) 12/29/2015   VD25OH 15 (L) 10/18/2013    3. At risk for impaired metabolic function Valerie Carter is at risk for impaired metabolic function due to meal skipping and inadequate food/protein intake..  Assessment/Plan:   1. Type 2 diabetes mellitus with hyperglycemia, without long-term current use of insulin (HCC) We will refill [name] 5 mg weekly.   - tirzepatide [facility]) 5 MG/0.5ML Pen; Inject 5 mg into the skin once a week.  Dispense: 6 mL;  Refill: 0  2. Vitamin D deficiency Low Vitamin D level contributes to fatigue and are associated with obesity, breast, and colon cancer. We will refill prescription Vitamin D 50,000 IU every week and Yanai will follow-up for routine testing of Vitamin D, at least 2-3 times per year to avoid over-replacement. We will check Vitamin D at next office visit.  - Vitamin D, Ergocalciferol, (DRISDOL) 1.25 MG (50000 UNIT) CAPS capsule; Take 1 capsule (50,000 Units total) by mouth every 7 (seven) days.  Dispense: 4 capsule; Refill: 0  3. At risk for impaired metabolic function [name] was given approximately 15 minutes of impaired  metabolic function prevention counseling today. We discussed intensive lifestyle modifications today with an emphasis on specific nutrition and exercise instructions and strategies.   Repetitive spaced learning was employed today to elicit superior memory formation and behavioral change.   4. Obesity, current BMI of 54.9 Valerie Carter is currently in the action stage of change. As such, her goal is to continue with weight loss efforts. She has agreed to the Category 2 Plan.   Valerie Carter may have a protein shake at breakfast. Category 2 plan was provided for her today.  Exercise goals:  As is.  Behavioral modification strategies: increasing lean protein intake and no skipping meals.  Valerie Carter has agreed to follow-up with our clinic in 3 weeks.  Objective:   Blood pressure 140/85, pulse 92, temperature 98 F (36.7 C),  height 5\' 4"  (1.626 m), weight (!) 320 lb (145.2 kg), SpO2 98 %. Body mass index is 54.93 kg/m.  General: Cooperative, alert, well developed, in no acute distress. HEENT: Conjunctivae and lids unremarkable. Cardiovascular: Regular rhythm.  Lungs: Normal work of breathing. Neurologic: No focal deficits.   Lab Results  Component Value Date   CREATININE 0.80 03/25/2021   BUN 15 03/25/2021   NA 140 03/25/2021   K 3.9 03/25/2021   CL 106 03/25/2021   CO2 22  03/25/2021   Lab Results  Component Value Date   ALT 21 09/10/2020   AST 30 09/10/2020   ALKPHOS 102 09/10/2020   BILITOT 0.7 09/10/2020   Lab Results  Component Value Date   HGBA1C 6.9 (H) 09/10/2020   HGBA1C 6.0 03/28/2020   HGBA1C 5.5 02/10/2018   HGBA1C 6.7 05/02/2017   HGBA1C 9.6 01/28/2017   Lab Results  Component Value Date   INSULIN 14.8 09/10/2020   Lab Results  Component Value Date   TSH 0.872 09/10/2020   Lab Results  Component Value Date   CHOL 212 (H) 09/10/2020   HDL 70 09/10/2020   LDLCALC 132 (H) 09/10/2020   LDLDIRECT 114 (H) 09/07/2011   TRIG 59 09/10/2020   CHOLHDL 3.0 09/10/2020   Lab Results  Component Value Date   VD25OH 41.2 09/10/2020   VD25OH 29 (L) 12/29/2015   VD25OH 15 (L) 10/18/2013   Lab Results  Component Value Date   WBC 5.9 03/25/2021   HGB 13.4 03/25/2021   HCT 45.8 03/25/2021   MCV 85.3 03/25/2021   PLT 143 (L) 03/25/2021   Lab Results  Component Value Date   IRON 31 (L) 09/11/2015   TIBC 287 09/11/2015   FERRITIN 56 09/11/2015   Attestation Statements:   Reviewed by clinician on day of visit: allergies, medications, problem list, medical history, surgical history, family history, social history, and previous encounter notes.  I, Lizbeth Bark, RMA, am acting as Location manager for [employer], [place].   I have reviewed the above documentation for accuracy and completeness, and I agree with the above. -  Georgianne Fick, FNP

## 2021-04-10 DIAGNOSIS — E119 Type 2 diabetes mellitus without complications: Secondary | ICD-10-CM | POA: Insufficient documentation

## 2021-04-10 DIAGNOSIS — E1165 Type 2 diabetes mellitus with hyperglycemia: Secondary | ICD-10-CM | POA: Insufficient documentation

## 2021-04-24 ENCOUNTER — Ambulatory Visit: Payer: BC Managed Care – PPO | Admitting: Family Medicine

## 2021-04-29 ENCOUNTER — Encounter (INDEPENDENT_AMBULATORY_CARE_PROVIDER_SITE_OTHER): Payer: Self-pay | Admitting: Family Medicine

## 2021-04-29 ENCOUNTER — Other Ambulatory Visit: Payer: Self-pay

## 2021-04-29 ENCOUNTER — Telehealth (INDEPENDENT_AMBULATORY_CARE_PROVIDER_SITE_OTHER): Payer: BC Managed Care – PPO | Admitting: Family Medicine

## 2021-04-29 DIAGNOSIS — E559 Vitamin D deficiency, unspecified: Secondary | ICD-10-CM | POA: Diagnosis not present

## 2021-04-29 DIAGNOSIS — Z6841 Body Mass Index (BMI) 40.0 and over, adult: Secondary | ICD-10-CM | POA: Diagnosis not present

## 2021-04-29 DIAGNOSIS — E1169 Type 2 diabetes mellitus with other specified complication: Secondary | ICD-10-CM | POA: Diagnosis not present

## 2021-04-29 MED ORDER — VITAMIN D (ERGOCALCIFEROL) 1.25 MG (50000 UNIT) PO CAPS: 50000.0000 [IU] | ORAL_CAPSULE | ORAL | 0 refills | Status: DC

## 2021-04-29 NOTE — Progress Notes (Signed)
Valerie Carter has verbally consented to this TeleHealth visit. The patient is located at home, the provider is located at the [facility] office. The participants in this visit include the listed provider and patient. The visit was conducted today via video.  Chief Complaint: OBESITY Valerie Carter is here to discuss her progress with her obesity treatment plan along with follow-up of her obesity related diagnoses. Valerie Carter is on the Category 2 Plan and states she is following her eating plan approximately 65% of the time. Valerie Carter states she is doing 0 minutes 0 times per week.  Today's visit was #: 10 Starting weight: 336 lbs Starting date: 08/19/2020  Interim History: We are doing a virtual visit because Valerie Carter has a cold.  She is still not doing well with having regular meals.She often skips breakfast and lunch and the has veggie sub from [employer] at lunch.  She says she has never eaten regular meals.  She has cut back on sweet tea. She denies excessive snacking. She says that she would like to lose 20 lbs by January.  Valerie Carter is status post gastric sleeve in 2018. Her Nadir was 268 lbs. High weight was 349 lbs.  Subjective:   1. Type 2 diabetes mellitus with other specified complication, without long-term current use of insulin (HCC) Valerie Carter's Type 2 diabetes is well controlled. Her last A1C was 6.9. She is on [name] 5 mg weekly. She asked about increasing the dose of [name].   Lab Results  Component Value Date   HGBA1C 6.9 (H) 09/10/2020   HGBA1C 6.0 03/28/2020   HGBA1C 5.5 02/10/2018   Lab Results  Component Value Date   MICROALBUR 0.8 02/06/2015   LDLCALC 132 (H) 09/10/2020   CREATININE 0.80 03/25/2021   Lab Results  Component Value Date   INSULIN 14.8 09/10/2020    2. Vitamin D deficiency Valerie Carter's Vitamin D is slightly low at 41.2. She is on weekly prescription Vitamin D.   Lab Results  Component Value Date   VD25OH 41.2 09/10/2020   VD25OH 29 (L) 12/29/2015    VD25OH 15 (L) 10/18/2013    Assessment/Plan:   1. Type 2 diabetes mellitus with other specified complication, without long-term current use of insulin ([place]) Pantera will continue 5 mg of [name]. I advised her that she needs to eat more and does not need an increased dose of [name].   2. Vitamin D deficiency  We will refill prescription Vitamin D 50,000 IU every week and Valerie Carter will follow-up for routine testing of Vitamin D, at least 2-3 times per year to avoid over-replacement. We will check labs at next office visit.   - Vitamin D, Ergocalciferol, (DRISDOL) 1.25 MG (50000 UNIT) CAPS capsule; Take 1 capsule (50,000 Units total) by mouth every 7 (seven) days.  Dispense: 4 capsule; Refill: 0  3. Obesity, current BMI of 54.9 Valerie Carter is currently in the action stage of change. As such, her goal is to continue with weight loss efforts. She has agreed to the Category 2 Plan.   Valerie Carter will track protein and aim for 70 grams of protein per day. Discussed that she will start packing her lunch to take to work.  She will try to have a yogurt or protein bar or shake for breakfast.  Discussed importance if regular meals and protein intake for weight loss.   Exercise goals: No exercise has been prescribed at this time.  Behavioral modification strategies: increasing lean protein intake and no skipping meals.  Aleea has agreed to  follow-up with our clinic in 3 weeks(fasting) with Dr. Juleen China.  Objective:   VITALS: Per patient if applicable, see vitals. GENERAL: Alert and in no acute distress. CARDIOPULMONARY: No increased WOB. Speaking in clear sentences.  PSYCH: Pleasant and cooperative. Speech normal rate and rhythm. Affect is appropriate. Insight and judgement are appropriate. Attention is focused, linear, and appropriate.  NEURO: Oriented as arrived to appointment on time with no prompting.   Lab Results  Component Value Date   CREATININE 0.80 03/25/2021   BUN 15 03/25/2021   NA 140  03/25/2021   K 3.9 03/25/2021   CL 106 03/25/2021   CO2 22 03/25/2021   Lab Results  Component Value Date   ALT 21 09/10/2020   AST 30 09/10/2020   ALKPHOS 102 09/10/2020   BILITOT 0.7 09/10/2020   Lab Results  Component Value Date   HGBA1C 6.9 (H) 09/10/2020   HGBA1C 6.0 03/28/2020   HGBA1C 5.5 02/10/2018   HGBA1C 6.7 05/02/2017   HGBA1C 9.6 01/28/2017   Lab Results  Component Value Date   INSULIN 14.8 09/10/2020   Lab Results  Component Value Date   TSH 0.872 09/10/2020   Lab Results  Component Value Date   CHOL 212 (H) 09/10/2020   HDL 70 09/10/2020   LDLCALC 132 (H) 09/10/2020   LDLDIRECT 114 (H) 09/07/2011   TRIG 59 09/10/2020   CHOLHDL 3.0 09/10/2020   Lab Results  Component Value Date   VD25OH 41.2 09/10/2020   VD25OH 29 (L) 12/29/2015   VD25OH 15 (L) 10/18/2013   Lab Results  Component Value Date   WBC 5.9 03/25/2021   HGB 13.4 03/25/2021   HCT 45.8 03/25/2021   MCV 85.3 03/25/2021   PLT 143 (L) 03/25/2021   Lab Results  Component Value Date   IRON 31 (L) 09/11/2015   TIBC 287 09/11/2015   FERRITIN 56 09/11/2015    Attestation Statements:   Reviewed by clinician on day of visit: allergies, medications, problem list, medical history, surgical history, family history, social history, and previous encounter notes.   I, Lizbeth Bark, RMA, am acting as Location manager for [employer], [place].   I have reviewed the above documentation for accuracy and completeness, and I agree with the above. - Georgianne Fick, FNP

## 2021-05-08 ENCOUNTER — Encounter: Payer: Self-pay | Admitting: Family Medicine

## 2021-05-08 ENCOUNTER — Ambulatory Visit (INDEPENDENT_AMBULATORY_CARE_PROVIDER_SITE_OTHER): Payer: BC Managed Care – PPO | Admitting: Family Medicine

## 2021-05-08 VITALS — BP 134/85 | Ht 64.5 in | Wt 318.0 lb

## 2021-05-08 DIAGNOSIS — M75100 Unspecified rotator cuff tear or rupture of unspecified shoulder, not specified as traumatic: Secondary | ICD-10-CM | POA: Insufficient documentation

## 2021-05-08 DIAGNOSIS — I10 Essential (primary) hypertension: Secondary | ICD-10-CM | POA: Diagnosis not present

## 2021-05-08 DIAGNOSIS — M170 Bilateral primary osteoarthritis of knee: Secondary | ICD-10-CM

## 2021-05-08 MED ORDER — METHYLPREDNISOLONE ACETATE 40 MG/ML IJ SUSP
40.0000 mg | Freq: Once | INTRAMUSCULAR | Status: AC
  Administered 2021-05-08: 40 mg via INTRA_ARTICULAR

## 2021-05-08 NOTE — Assessment & Plan Note (Signed)
-   Not currently on medications

## 2021-05-08 NOTE — Assessment & Plan Note (Signed)
Bilateral issues but right worse symptomatically.  We will try corticosteroid injection today and see her back in 1 month.

## 2021-05-08 NOTE — Assessment & Plan Note (Signed)
Applauded her weight loss through a healthy weight and wellness.

## 2021-05-08 NOTE — Progress Notes (Signed)
  Valerie Carter - 60 y.o. female MRN [id number]  Date of birth: 1961/02/07    SUBJECTIVE:      Chief Complaint:/ HPI:  #1.  Right shoulder pain.  Really bothering her at night awakening her from sleep.  Anything that she tries to do above shoulder height is painful particularly if it is out to the side.  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  She started to have some problems in her left shoulder but the right is much worse.  No numbness or tingling in her hands.  No weakness.  Just sharp pains with certain movements and some aching at night. 2.  Brings me some forms regarding her pain medication.  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  I accidentally refilled the 1 in September for the higher quantity and that is evidently causing some issues with her license. #3.  Obesity: She is being seen at [facility] and is steadily losing weight.  She hopes to get her right knee replaced may be this summer.    OBJECTIVE: BP 134/85   Ht 5' 4.5" (1.638 m)   Wt (!) 318 lb (144.2 kg)   BMI 53.74 kg/m   Physical Exam:  Vital signs are reviewed. GENERAL: Well-developed female no acute distress SHOULDERS: Bilaterally symmetrical.  Right shoulder limited range of motion secondary to pain in abduction and forward flexion above 90 degrees.  She has full strength in all planes the rotator cuff however.  The bicep tendon is nontender to palpation.  PROCEDURE: INJECTION: Patient was given informed consent, signed copy in the chart. Appropriate time out was taken. Area prepped and draped in usual sterile fashion. Ethyl chloride was  used for local anesthesia. A 21 gauge 1 1/2 inch needle was used.. 1 cc of methylprednisolone 40 mg/ml plus  4 cc of 1% lidocaine without epinephrine was injected into the right  subacromial bursa using a(n) posterior approach.   The patient tolerated the procedure well. There were no complications. Post procedure instructions were given.  ASSESSMENT & PLAN:  See problem based charting & AVS for pt instructions. HYPERTENSION, BENIGN ESSENTIAL Not currently on medications.  Obesity Applauded her weight loss through a [facility].  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  Copy in the chart.  I also did a letter with more detailed explanation.  Rotator cuff syndrome Bilateral issues but right worse symptomatically.  We will try corticosteroid injection today and see her back in 1 month.

## 2021-05-08 NOTE — Assessment & Plan Note (Signed)
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  Copy in the chart.  I also did a letter with more detailed explanation.

## 2021-05-08 NOTE — Assessment & Plan Note (Signed)
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  Copy in the chart.  I also did a letter with more detailed explanation.

## 2021-05-12 ENCOUNTER — Emergency Department (HOSPITAL_BASED_OUTPATIENT_CLINIC_OR_DEPARTMENT_OTHER)
Admission: EM | Admit: 2021-05-12 | Discharge: 2021-05-12 | Disposition: A | Discharge status: Home / Self Care | Payer: BC Managed Care – PPO | Attending: Emergency Medicine | Admitting: Emergency Medicine

## 2021-05-12 ENCOUNTER — Other Ambulatory Visit: Payer: Self-pay

## 2021-05-12 ENCOUNTER — Encounter (HOSPITAL_BASED_OUTPATIENT_CLINIC_OR_DEPARTMENT_OTHER): Payer: Self-pay | Admitting: Emergency Medicine

## 2021-05-12 ENCOUNTER — Emergency Department (HOSPITAL_BASED_OUTPATIENT_CLINIC_OR_DEPARTMENT_OTHER): Payer: BC Managed Care – PPO | Admitting: Radiology

## 2021-05-12 DIAGNOSIS — S29001A Unspecified injury of muscle and tendon of front wall of thorax, initial encounter: Secondary | ICD-10-CM | POA: Insufficient documentation

## 2021-05-12 DIAGNOSIS — Z7984 Long term (current) use of oral hypoglycemic drugs: Secondary | ICD-10-CM | POA: Insufficient documentation

## 2021-05-12 DIAGNOSIS — R0781 Pleurodynia: Secondary | ICD-10-CM | POA: Diagnosis not present

## 2021-05-12 DIAGNOSIS — E119 Type 2 diabetes mellitus without complications: Secondary | ICD-10-CM | POA: Diagnosis not present

## 2021-05-12 DIAGNOSIS — S299XXA Unspecified injury of thorax, initial encounter: Secondary | ICD-10-CM

## 2021-05-12 DIAGNOSIS — I10 Essential (primary) hypertension: Secondary | ICD-10-CM | POA: Diagnosis not present

## 2021-05-12 DIAGNOSIS — W182XXA Fall in (into) shower or empty bathtub, initial encounter: Secondary | ICD-10-CM | POA: Insufficient documentation

## 2021-05-12 NOTE — ED Triage Notes (Signed)
R rib pain after falling while getting out of the bath tub on Friday.

## 2021-05-12 NOTE — ED Provider Notes (Signed)
[facility] Provider Note  CSN: [id number] Arrival date & time: 05/12/21 4097    History Chief Complaint  Patient presents with   Valerie Carter is a 60 y.o. female report she slipped and fell getting out of the tub 4 days ago landing on her R side and injuring her right lateral ribs. She reports pain has been worsening some over the last 2 days. She has taken some Motrin with some improvement. Pain is worse with deep breath and cough. No other acute injuries.    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   Diabetes mellitus without complication ([place])    type 2   DJD (degenerative joint disease)    both knees right worse than left   Headache    Hyperlipidemia    Hypertension    Intramural leiomyoma of uterus    Joint pain    Knee pain    Lactose intolerance    Lower extremity edema    Morbid obesity with BMI of 50.0-59.9, adult ([place])    Osteoarthritis    Vitamin D deficiency     Past Surgical History:  Procedure Laterality Date   KNEE SURGERY Right 1999   torn cartlidge repair   LAPAROSCOPIC GASTRIC SLEEVE RESECTION N/A 06/07/2017   Procedure: LAPAROSCOPIC GASTRIC SLEEVE RESECTION, UPPER ENDOSCOPY;  Surgeon: Kinsinger, Arta Bruce, MD;  Location: [facility];  Service: General;  Laterality: N/A;    Family History  Problem Relation Age of Onset   Diabetes Sister    Hypertension Sister    Diabetes Maternal Aunt    Breast cancer Maternal Aunt 29   Hypertension Mother    Diabetes Father     Social History   Tobacco Use   Smoking status: Never   Smokeless tobacco: Never  Vaping Use   Vaping Use: Never used  Substance Use Topics   Alcohol use: No    Alcohol/week: 0.0 standard drinks   Drug use: No     Home Medications Prior to Admission medications   Medication Sig Start Date End Date Taking? Authorizing Provider   HYDROcodone-acetaminophen (NORCO) 10-325 MG tablet Take one tablet up to 4 times a day as directed for knee pain 03/20/21   Dickie La, MD  tirzepatide [facility]) 5 MG/0.5ML Pen Inject 5 mg into the skin once a week. 04/08/21   Whitmire, Joneen Boers, FNP  Vitamin D, Ergocalciferol, (DRISDOL) 1.25 MG (50000 UNIT) CAPS capsule Take 1 capsule (50,000 Units total) by mouth every 7 (seven) days. 04/29/21   Whitmire, Joneen Boers, FNP     Allergies    Patient has no known allergies.   Review of Systems   Review of Systems A comprehensive review of systems was completed and negative except as noted in HPI.    Physical Exam BP 140/77 (BP Location: Left Arm)   Pulse 78   Temp 98 F (36.7 C) (Oral)   Resp 18   Ht 5' 4.5" (1.638 m)   Wt (!) 144.2 kg   SpO2 98%   BMI 53.73 kg/m   Physical Exam Vitals and nursing note reviewed.  Constitutional:      Appearance: Normal appearance.  HENT:     Head: Normocephalic and atraumatic.     Nose: Nose normal.     Mouth/Throat:     Mouth: Mucous  membranes are moist.  Eyes:     Extraocular Movements: Extraocular movements intact.     Conjunctiva/sclera: Conjunctivae normal.  Cardiovascular:     Rate and Rhythm: Normal rate.  Pulmonary:     Effort: Pulmonary effort is normal.     Breath sounds: Normal breath sounds.  Chest:     Chest wall: Tenderness (R lateral chest wall, no bruising) present.  Abdominal:     General: Abdomen is flat.     Palpations: Abdomen is soft.     Tenderness: There is no abdominal tenderness.  Musculoskeletal:        General: No swelling. Normal range of motion.     Cervical back: Neck supple.  Skin:    General: Skin is warm and dry.  Neurological:     General: No focal deficit present.     Mental Status: She is alert.  Psychiatric:        Mood and Affect: Mood normal.     ED Results / Procedures / Treatments   Labs (all labs ordered are listed, but only abnormal results are displayed) Labs Reviewed - No data to  display  EKG None   Radiology DG Ribs Unilateral W/Chest Right  Result Date: 05/12/2021 CLINICAL DATA:  Right-sided rib pain after fall last Friday. EXAM: RIGHT RIBS AND CHEST - 3+ VIEW COMPARISON:  Chest x-ray dated February 25, 2017. FINDINGS: No fracture or other bone lesions are seen involving the ribs. There is no evidence of pneumothorax or pleural effusion. Both lungs are clear. Heart size and mediastinal contours are within normal limits. IMPRESSION: Negative. Electronically Signed   By: Titus Dubin M.D.   On: 05/12/2021 11:23    Procedures Procedures  Medications Ordered in the ED Medications - No data to display   MDM Rules/Calculators/A&P MDM Patient had xrays done from triage with no signs of rib fracture. Recommend Motrin/APAP for pain. Discussed splinting with a pillow if needed. PCP follow up.   ED Course  I have reviewed the triage vital signs and the nursing notes.  Pertinent labs & imaging results that were available during my care of the patient were reviewed by me and considered in my medical decision making (see chart for details).     Final Clinical Impression(s) / ED Diagnoses Final diagnoses:  Chest wall injury, initial encounter    Rx / DC Orders ED Discharge Orders     None        Truddie Hidden, MD 05/12/21 1232

## 2021-05-12 NOTE — ED Notes (Signed)
This RN presented the AVS utilizing Teachback Method. Patient verbalizes understanding of Discharge Instructions. Opportunity for Questioning and Answers were provided. Patient Discharged from ED in wheelchair to Home.

## 2021-05-18 ENCOUNTER — Other Ambulatory Visit: Payer: Self-pay

## 2021-05-18 ENCOUNTER — Ambulatory Visit (INDEPENDENT_AMBULATORY_CARE_PROVIDER_SITE_OTHER): Payer: BC Managed Care – PPO | Admitting: Family Medicine

## 2021-05-18 ENCOUNTER — Encounter (INDEPENDENT_AMBULATORY_CARE_PROVIDER_SITE_OTHER): Payer: Self-pay | Admitting: Family Medicine

## 2021-05-18 VITALS — BP 137/84 | HR 84 | Temp 97.8°F | Ht 64.0 in | Wt 312.0 lb

## 2021-05-18 DIAGNOSIS — Z6841 Body Mass Index (BMI) 40.0 and over, adult: Secondary | ICD-10-CM

## 2021-05-18 DIAGNOSIS — E1169 Type 2 diabetes mellitus with other specified complication: Secondary | ICD-10-CM | POA: Diagnosis not present

## 2021-05-18 DIAGNOSIS — E785 Hyperlipidemia, unspecified: Secondary | ICD-10-CM

## 2021-05-18 DIAGNOSIS — E559 Vitamin D deficiency, unspecified: Secondary | ICD-10-CM | POA: Diagnosis not present

## 2021-05-18 DIAGNOSIS — E1159 Type 2 diabetes mellitus with other circulatory complications: Secondary | ICD-10-CM

## 2021-05-18 DIAGNOSIS — I152 Hypertension secondary to endocrine disorders: Secondary | ICD-10-CM

## 2021-05-19 LAB — HEMOGLOBIN A1C
Est. average glucose Bld gHb Est-mCnc: 114 mg/dL
Hgb A1c MFr Bld: 5.6 % (ref 4.8–5.6)

## 2021-05-19 LAB — CBC WITH DIFFERENTIAL/PLATELET
Basophils Absolute: 0 10*3/uL (ref 0.0–0.2)
Basos: 1 %
EOS (ABSOLUTE): 0.2 10*3/uL (ref 0.0–0.4)
Eos: 2 %
Hematocrit: 40.3 % (ref 34.0–46.6)
Hemoglobin: 13 g/dL (ref 11.1–15.9)
Immature Grans (Abs): 0 10*3/uL (ref 0.0–0.1)
Immature Granulocytes: 0 %
Lymphocytes Absolute: 1.9 10*3/uL (ref 0.7–3.1)
Lymphs: 27 %
MCH: 24.9 pg — ABNORMAL LOW (ref 26.6–33.0)
MCHC: 32.3 g/dL (ref 31.5–35.7)
MCV: 77 fL — ABNORMAL LOW (ref 79–97)
Monocytes Absolute: 0.8 10*3/uL (ref 0.1–0.9)
Monocytes: 10 %
Neutrophils Absolute: 4.4 10*3/uL (ref 1.4–7.0)
Neutrophils: 60 %
Platelets: 250 10*3/uL (ref 150–450)
RBC: 5.22 x10E6/uL (ref 3.77–5.28)
RDW: 14.3 % (ref 11.7–15.4)
WBC: 7.3 10*3/uL (ref 3.4–10.8)

## 2021-05-19 LAB — COMPREHENSIVE METABOLIC PANEL
ALT: 21 IU/L (ref 0–32)
AST: 26 IU/L (ref 0–40)
Albumin/Globulin Ratio: 1 — ABNORMAL LOW (ref 1.2–2.2)
Albumin: 3.7 g/dL — ABNORMAL LOW (ref 3.8–4.9)
Alkaline Phosphatase: 116 IU/L (ref 44–121)
BUN/Creatinine Ratio: 17 (ref 12–28)
BUN: 15 mg/dL (ref 8–27)
Bilirubin Total: 0.8 mg/dL (ref 0.0–1.2)
CO2: 23 mmol/L (ref 20–29)
Calcium: 9.3 mg/dL (ref 8.7–10.3)
Chloride: 104 mmol/L (ref 96–106)
Creatinine, Ser: 0.9 mg/dL (ref 0.57–1.00)
Globulin, Total: 3.8 g/dL (ref 1.5–4.5)
Glucose: 81 mg/dL (ref 70–99)
Potassium: 4.4 mmol/L (ref 3.5–5.2)
Sodium: 141 mmol/L (ref 134–144)
Total Protein: 7.5 g/dL (ref 6.0–8.5)
eGFR: 73 mL/min/{1.73_m2} (ref >59)

## 2021-05-19 LAB — LIPID PANEL
Chol/HDL Ratio: 2.8 ratio (ref 0.0–4.4)
Cholesterol, Total: 203 mg/dL — ABNORMAL HIGH (ref 100–199)
HDL: 73 mg/dL (ref >39)
LDL Chol Calc (NIH): 121 mg/dL — ABNORMAL HIGH (ref 0–99)
Triglycerides: 49 mg/dL (ref 0–149)
VLDL Cholesterol Cal: 9 mg/dL (ref 5–40)

## 2021-05-19 LAB — VITAMIN D 25 HYDROXY (VIT D DEFICIENCY, FRACTURES): Vit D, 25-Hydroxy: 49.9 ng/mL (ref 30.0–100.0)

## 2021-05-19 MED ORDER — TIRZEPATIDE 5 MG/0.5ML ~~LOC~~ SOAJ: 5.0000 mg | SUBCUTANEOUS | 0 refills | Status: DC

## 2021-05-19 MED ORDER — TIRZEPATIDE 7.5 MG/0.5ML ~~LOC~~ SOAJ: 7.5000 mg | SUBCUTANEOUS | 0 refills | Status: DC

## 2021-05-19 NOTE — Progress Notes (Signed)
Chief Complaint:   OBESITY Valerie Carter is here to discuss her progress with her obesity treatment plan along with follow-up of her obesity related diagnoses. See Medical Weight Management Flowsheet for complete bioelectrical impedance results.  Today's visit was #: 10 Starting weight: 336 lbs Starting date: 08/19/2020 Weight change since last visit: 8 lbs Total lbs lost to date: 24 lbs Total weight loss percentage to date: -7.14%  Nutrition Plan: Category 2 Plan for 75% of the time.  Activity: Chair dancing for 15 minutes 4 times per week.  Anti-obesity medications: [name] 5 mg subcutaneously weekly. Reported side effects: None.  Interim History: Valerie Carter says she has not been eating after 7 pm.  She is tolerating [name] well.  Assessment/Plan:   1. Type 2 diabetes mellitus with other specified complication, without long-term current use of insulin (HCC) Diabetes Mellitus: At goal. Medication: [name] 5 mg subcutaneously weekly. Issues reviewed: blood sugar goals, complications of diabetes mellitus, hypoglycemia prevention and treatment, exercise, and nutrition.  Plan:  Increase [name] to 7.5 mg subcutaneously weekly, as per below.  The patient will continue to focus on protein-rich, low simple carbohydrate foods. We reviewed the importance of hydration, regular exercise for stress reduction, and restorative sleep. Will check labs today.  Lab Results  Component Value Date   HGBA1C 5.6 05/18/2021   HGBA1C 6.9 (H) 09/10/2020   HGBA1C 6.0 03/28/2020   Lab Results  Component Value Date   MICROALBUR 0.8 02/06/2015   LDLCALC 121 (H) 05/18/2021   CREATININE 0.90 05/18/2021   - CBC with Differential/Platelet - Hemoglobin A1c  2. Vitamin D deficiency Not at goal.   Plan: Continue to take prescription Vitamin D @50 ,000 IU every week as prescribed. Will check vitamin D level today, as per below.  Lab Results  Component Value Date   VD25OH 41.2 09/10/2020   VD25OH 29 (L)  12/29/2015    - VITAMIN D 25 Hydroxy (Vit-D Deficiency, Fractures)  3. Hyperlipidemia associated with type 2 diabetes mellitus ([place]) Course: Not at goal. Lipid-lowering medications: None.   Plan: Dietary changes: Increase soluble fiber, decrease simple carbohydrates, decrease saturated fat. Exercise changes: Moderate to vigorous-intensity aerobic activity 150 minutes per week or as tolerated. We will continue to monitor along with PCP/specialists as it pertains to her weight loss journey.  Will check labs today.  Lab Results  Component Value Date   CHOL 203 (H) 05/18/2021   HDL 73 05/18/2021   LDLCALC 121 (H) 05/18/2021   LDLDIRECT 114 (H) 09/07/2011   TRIG 49 05/18/2021   CHOLHDL 2.8 05/18/2021   Lab Results  Component Value Date   ALT 21 05/18/2021   AST 26 05/18/2021   ALKPHOS 116 05/18/2021   BILITOT 0.8 05/18/2021   The 10-year ASCVD risk score (Arnett DK, et al., 2019) is: 12.4%   Values used to calculate the score:     Age: 60 years     Sex: Female     Is Non-Hispanic African American: Yes     Diabetic: Yes     Tobacco smoker: No     Systolic Blood Pressure: 009 mmHg     Is BP treated: No     HDL Cholesterol: 73 mg/dL     Total Cholesterol: 203 mg/dL  - Comprehensive metabolic panel - Lipid panel  4. Hypertension associated with type 2 diabetes mellitus ([place]) At goal. Medications: None.   Plan: Avoid buying foods that are: processed, frozen, or prepackaged to avoid excess salt. We will watch for signs  of hypotension as she continues lifestyle modifications.  BP Readings from Last 3 Encounters:  05/18/21 137/84  05/12/21 140/77  05/08/21 134/85   Lab Results  Component Value Date   CREATININE 0.90 05/18/2021   5. Obesity, current BMI of 53.7  Course: Valerie Carter is currently in the action stage of change. As such, her goal is to continue with weight loss efforts.   Nutrition goals: She has agreed to the Category 2 Plan.   Exercise goals:  As  is.  Behavioral modification strategies: increasing lean protein intake, decreasing simple carbohydrates, increasing vegetables, and increasing water intake.  Valerie Carter has agreed to follow-up with our clinic in 4 weeks. She was informed of the importance of frequent follow-up visits to maximize her success with intensive lifestyle modifications for her multiple health conditions.   Objective:   Blood pressure 137/84, pulse 84, temperature 97.8 F (36.6 C), temperature source Oral, height 5\' 4"  (1.626 m), weight (!) 312 lb (141.5 kg), SpO2 97 %. Body mass index is 53.55 kg/m.  General: Cooperative, alert, well developed, in no acute distress. HEENT: Conjunctivae and lids unremarkable. Cardiovascular: Regular rhythm.  Lungs: Normal work of breathing. Neurologic: No focal deficits.   Lab Results  Component Value Date   CREATININE 0.90 05/18/2021   BUN 15 05/18/2021   NA 141 05/18/2021   K 4.4 05/18/2021   CL 104 05/18/2021   CO2 23 05/18/2021   Lab Results  Component Value Date   ALT 21 05/18/2021   AST 26 05/18/2021   ALKPHOS 116 05/18/2021   BILITOT 0.8 05/18/2021   Lab Results  Component Value Date   HGBA1C 5.6 05/18/2021   HGBA1C 6.9 (H) 09/10/2020   HGBA1C 6.0 03/28/2020   HGBA1C 5.5 02/10/2018   HGBA1C 6.7 05/02/2017   Lab Results  Component Value Date   INSULIN 14.8 09/10/2020   Lab Results  Component Value Date   TSH 0.872 09/10/2020   Lab Results  Component Value Date   CHOL 203 (H) 05/18/2021   HDL 73 05/18/2021   LDLCALC 121 (H) 05/18/2021   LDLDIRECT 114 (H) 09/07/2011   TRIG 49 05/18/2021   CHOLHDL 2.8 05/18/2021   Lab Results  Component Value Date   VD25OH 49.9 05/18/2021   VD25OH 41.2 09/10/2020   VD25OH 29 (L) 12/29/2015   Lab Results  Component Value Date   WBC 7.3 05/18/2021   HGB 13.0 05/18/2021   HCT 40.3 05/18/2021   MCV 77 (L) 05/18/2021   PLT 250 05/18/2021   Lab Results  Component Value Date   IRON 31 (L) 09/11/2015    TIBC 287 09/11/2015   FERRITIN 56 09/11/2015   Attestation Statements:   Reviewed by clinician on day of visit: allergies, medications, problem list, medical history, surgical history, family history, social history, and previous encounter notes.  I, Water quality scientist, CMA, am acting as transcriptionist for Briscoe Deutscher, DO  I have reviewed the above documentation for accuracy and completeness, and I agree with the above. -  Briscoe Deutscher, DO, MS, FAAFP, DABOM - Family and Bariatric Medicine.

## 2021-05-20 ENCOUNTER — Other Ambulatory Visit (INDEPENDENT_AMBULATORY_CARE_PROVIDER_SITE_OTHER): Payer: Self-pay | Admitting: Family Medicine

## 2021-05-20 DIAGNOSIS — E1169 Type 2 diabetes mellitus with other specified complication: Secondary | ICD-10-CM

## 2021-05-25 ENCOUNTER — Ambulatory Visit: Payer: BC Managed Care – PPO | Admitting: Family Medicine

## 2021-06-02 ENCOUNTER — Ambulatory Visit (INDEPENDENT_AMBULATORY_CARE_PROVIDER_SITE_OTHER): Payer: BC Managed Care – PPO | Admitting: Family Medicine

## 2021-06-02 ENCOUNTER — Encounter: Payer: Self-pay | Admitting: Family Medicine

## 2021-06-02 ENCOUNTER — Other Ambulatory Visit: Payer: Self-pay

## 2021-06-02 VITALS — BP 138/88 | HR 85 | Ht 64.0 in | Wt 325.2 lb

## 2021-06-02 DIAGNOSIS — I10 Essential (primary) hypertension: Secondary | ICD-10-CM

## 2021-06-02 DIAGNOSIS — E1159 Type 2 diabetes mellitus with other circulatory complications: Secondary | ICD-10-CM

## 2021-06-02 DIAGNOSIS — E785 Hyperlipidemia, unspecified: Secondary | ICD-10-CM | POA: Diagnosis not present

## 2021-06-02 DIAGNOSIS — I152 Hypertension secondary to endocrine disorders: Secondary | ICD-10-CM | POA: Diagnosis not present

## 2021-06-02 DIAGNOSIS — E119 Type 2 diabetes mellitus without complications: Secondary | ICD-10-CM | POA: Diagnosis not present

## 2021-06-02 MED ORDER — LISINOPRIL 5 MG PO TABS: 5.0000 mg | ORAL_TABLET | Freq: Every day | ORAL | 0 refills | Status: DC

## 2021-06-02 MED ORDER — BLOOD PRESSURE KIT: PACK | 0 refills | Status: DC

## 2021-06-02 NOTE — Patient Instructions (Addendum)
Thank you for coming to see me today. It was a pleasure.   Monitor Blood pressure at home, 2-3 times a week Record readings and follow up with PCP  Start Lisinopril 5 mg at night  Return to clinic in 1 week for blood work  Please follow-up with PCP in 4 weeks  If you have any questions or concerns, please do not hesitate to call the office at [phone].  Best,   Carollee Leitz, MD

## 2021-06-02 NOTE — Progress Notes (Signed)
    SUBJECTIVE:   CHIEF COMPLAINT / HPI: blood pressure check  Had recent BP check at [facility] and was elevated.  Advised to be seen by PCP. Denies any chest pain, shortness of breath nausea or vomiting.  Was previously taking Benazepril and Amlodipine but was discontinued after Bariatric surgery given improvement in BP.    PERTINENT  PMH / PSH:  HTN Type 2 DM  OBJECTIVE:   BP 138/88   Pulse 85   Ht 5\' 4"  (1.626 m)   Wt (!) 325 lb 4 oz (147.5 kg)   SpO2 99%   BMI 55.83 kg/m   Initial BP 154/79   General: Alert, no acute distress Cardio: Normal S1 and S2, RRR, no r/m/g Pulm: CTAB, normal work of breathing Abdomen: Bowel sounds normal. Abdomen soft and non-tender.  Extremities: No peripheral edema.    ASSESSMENT/PLAN:   Diabetes mellitus ([place]) Well controlled on [name] Last A1c 5.6 Follows with [facility] Follow up with PCP for Diabetic management  HYPERTENSION, BENIGN ESSENTIAL Elevated today.  Review of previous readings not at goal <120/80 for DM -BMet today -Start Lisinopril 5 mg qhs -Repeat BMet in 1 week -Strict return precautions provided -Follow up with PCP in 4 weeks  Hyperlipidemia LDL goal <70 Not on statin.  Goal LDL <70 Last LDL 121 Discussed with patient starting statin Plan to initiate BP medication and start statin at next visit Follows with [facility] Follow up with PCP in 4 weeks     Carollee Leitz, MD [place]

## 2021-06-03 ENCOUNTER — Encounter: Payer: Self-pay | Admitting: Family Medicine

## 2021-06-03 LAB — BASIC METABOLIC PANEL
BUN/Creatinine Ratio: 26 (ref 12–28)
BUN: 19 mg/dL (ref 8–27)
CO2: 18 mmol/L — ABNORMAL LOW (ref 20–29)
Calcium: 9.1 mg/dL (ref 8.7–10.3)
Chloride: 106 mmol/L (ref 96–106)
Creatinine, Ser: 0.72 mg/dL (ref 0.57–1.00)
Glucose: 96 mg/dL (ref 70–99)
Potassium: 4.9 mmol/L (ref 3.5–5.2)
Sodium: 142 mmol/L (ref 134–144)
eGFR: 96 mL/min/{1.73_m2} (ref >59)

## 2021-06-03 NOTE — Assessment & Plan Note (Signed)
Well controlled on [name] Last A1c 5.6 Follows with [facility] Follow up with PCP for Diabetic management

## 2021-06-03 NOTE — Assessment & Plan Note (Signed)
Elevated today.  Review of previous readings not at goal <120/80 for DM -BMet today -Start Lisinopril 5 mg qhs -Repeat BMet in 1 week -Strict return precautions provided -Follow up with PCP in 4 weeks

## 2021-06-03 NOTE — Assessment & Plan Note (Signed)
Not on statin.  Goal LDL <70 Last LDL 121 Discussed with patient starting statin Plan to initiate BP medication and start statin at next visit Follows with [facility] Follow up with PCP in 4 weeks

## 2021-06-16 ENCOUNTER — Encounter: Payer: Self-pay | Admitting: Family Medicine

## 2021-06-18 ENCOUNTER — Ambulatory Visit (INDEPENDENT_AMBULATORY_CARE_PROVIDER_SITE_OTHER): Payer: BC Managed Care – PPO | Admitting: Family Medicine

## 2021-06-24 ENCOUNTER — Encounter (HOSPITAL_BASED_OUTPATIENT_CLINIC_OR_DEPARTMENT_OTHER): Payer: Self-pay | Admitting: Emergency Medicine

## 2021-06-24 ENCOUNTER — Other Ambulatory Visit: Payer: Self-pay

## 2021-06-24 DIAGNOSIS — I10 Essential (primary) hypertension: Secondary | ICD-10-CM | POA: Insufficient documentation

## 2021-06-24 DIAGNOSIS — Z79899 Other long term (current) drug therapy: Secondary | ICD-10-CM | POA: Insufficient documentation

## 2021-06-24 DIAGNOSIS — E119 Type 2 diabetes mellitus without complications: Secondary | ICD-10-CM | POA: Insufficient documentation

## 2021-06-24 DIAGNOSIS — R519 Headache, unspecified: Secondary | ICD-10-CM | POA: Diagnosis not present

## 2021-06-24 NOTE — ED Triage Notes (Signed)
°  Patient comes in with headache that has been going on for 2-3 days.  Patient states she has a hx of migraines but has not had one in a long time.  No photosensitivity, or N/V. No blurry/double vision.  Took motrin 800 mg around 1800 with no relief.  Pain 6/10, sharp/stabbing pain.

## 2021-06-25 ENCOUNTER — Emergency Department (HOSPITAL_BASED_OUTPATIENT_CLINIC_OR_DEPARTMENT_OTHER)
Admission: EM | Admit: 2021-06-25 | Discharge: 2021-06-25 | Disposition: A | Discharge status: Home / Self Care | Payer: BC Managed Care – PPO | Attending: Emergency Medicine | Admitting: Emergency Medicine

## 2021-06-25 DIAGNOSIS — R519 Headache, unspecified: Secondary | ICD-10-CM

## 2021-06-25 DIAGNOSIS — I10 Essential (primary) hypertension: Secondary | ICD-10-CM

## 2021-06-25 MED ORDER — CYCLOBENZAPRINE HCL 10 MG PO TABS
10.0000 mg | ORAL_TABLET | Freq: Once | ORAL | Status: AC
  Administered 2021-06-25: 03:00:00 10 mg via ORAL
  Filled 2021-06-25: qty 1

## 2021-06-25 MED ORDER — KETOROLAC TROMETHAMINE 30 MG/ML IJ SOLN
30.0000 mg | Freq: Once | INTRAMUSCULAR | Status: AC
  Administered 2021-06-25: 03:00:00 30 mg via INTRAVENOUS
  Filled 2021-06-25: qty 1

## 2021-06-25 MED ORDER — PROCHLORPERAZINE EDISYLATE 10 MG/2ML IJ SOLN
10.0000 mg | Freq: Once | INTRAMUSCULAR | Status: AC
  Administered 2021-06-25: 02:00:00 10 mg via INTRAVENOUS
  Filled 2021-06-25: qty 2

## 2021-06-25 MED ORDER — DIPHENHYDRAMINE HCL 50 MG/ML IJ SOLN
25.0000 mg | Freq: Once | INTRAMUSCULAR | Status: AC
  Administered 2021-06-25: 02:00:00 25 mg via INTRAVENOUS
  Filled 2021-06-25: qty 1

## 2021-06-25 MED ORDER — LACTATED RINGERS IV BOLUS
1000.0000 mL | Freq: Once | INTRAVENOUS | Status: AC
  Administered 2021-06-25: 02:00:00 1000 mL via INTRAVENOUS

## 2021-06-25 MED ORDER — CYCLOBENZAPRINE HCL 10 MG PO TABS: 10.0000 mg | ORAL_TABLET | Freq: Three times a day (TID) | ORAL | 0 refills | Status: DC | PRN

## 2021-06-25 MED ORDER — DEXAMETHASONE SODIUM PHOSPHATE 10 MG/ML IJ SOLN
10.0000 mg | Freq: Once | INTRAMUSCULAR | Status: AC
  Administered 2021-06-25: 02:00:00 10 mg via INTRAVENOUS
  Filled 2021-06-25: qty 1

## 2021-06-25 NOTE — Discharge Instructions (Addendum)
Apply ice for 30 minutes at a time, 4 times a day.  Take ibuprofen or naproxen as needed for pain.  For additional pain relief, add acetaminophen.  Combining acetaminophen with either ibuprofen or naproxen gives you better pain relief than either medication by itself.  Return if symptoms are getting worse, or or not being adequately controlled at home.

## 2021-06-25 NOTE — ED Provider Notes (Signed)
[facility] EMERGENCY DEPT Provider Note   CSN: [id number] Arrival date & time: 06/24/21  1902     History Chief Complaint  Patient presents with   Headache    Valerie Carter is a 60 y.o. female.  The history is provided by the patient.  Headache She has history of hypertension, diabetes, hyperlipidemia and comes in with a right-sided headache for the last 2 days.  Pain is described as sharp, dull, throbbing.  Pain is rated at 6/10.  Nothing makes it better, nothing makes it worse.  She denies any visual change, nausea, vomiting.  She denies weakness or numbness.  She denies any recent trauma or unusual activity.  She has taken ibuprofen which has given slight, temporary relief.   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   Diabetes mellitus without complication ([name])    type 2   DJD (degenerative joint disease)    both knees right worse than left   Headache    Hyperlipidemia    Hypertension    Intramural leiomyoma of uterus    Joint pain    Knee pain    Lactose intolerance    Lower extremity edema    Morbid obesity with BMI of 50.0-59.9, adult ([place])    Osteoarthritis    Vitamin D deficiency     Patient Active Problem List   Diagnosis Date Noted   Rotator cuff syndrome 05/08/2021   Diabetes mellitus ([place]) 04/10/2021   Rotator cuff impingement syndrome of left shoulder 01/07/2019   Sciatica of right side 05/24/2018   Constipation 03/16/2018   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   Intramural leiomyoma of uterus 01/27/2016   Healthcare maintenance 02/06/2015   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/25/2014   Vitamin D deficiency 10/19/2013   Seasonal allergies 09/07/2011   Arthritis of knee, degenerative 10/29/2010   Diet-controlled diabetes mellitus ([place]) 05/20/2010   Low back pain 01/29/2010   Hyperlipidemia LDL goal <70 10/01/2008    Obesity 10/24/2006   HYPERTENSION, BENIGN ESSENTIAL 09/26/2006    Past Surgical History:  Procedure Laterality Date   KNEE SURGERY Right 1999   torn cartlidge repair   LAPAROSCOPIC GASTRIC SLEEVE RESECTION N/A 06/07/2017   Procedure: LAPAROSCOPIC GASTRIC SLEEVE RESECTION, UPPER ENDOSCOPY;  Surgeon: Mickeal Skinner, MD;  Location: [facility];  Service: General;  Laterality: N/A;     OB History     Gravida  5   Para  0   Term  0   Preterm  0   AB  5   Living  0      SAB  5   IAB      Ectopic      Multiple      Live Births              Family History  Problem Relation Age of Onset   Diabetes Sister    Hypertension Sister    Diabetes Maternal Aunt    Breast cancer Maternal Aunt 29   Hypertension Mother    Diabetes Father     Social History   Tobacco Use   Smoking status: Never   Smokeless tobacco: Never  Vaping Use   Vaping Use: Never used  Substance Use Topics   Alcohol use: No    Alcohol/week: 0.0 standard drinks   Drug use: No  Home Medications Prior to Admission medications   Medication Sig Start Date End Date Taking? Authorizing Provider  Blood Pressure KIT Check your blood pressure 2-3 times a week 06/02/21   Carollee Leitz, MD  HYDROcodone-acetaminophen [facility]) 10-325 MG tablet Take one tablet up to 4 times a day as directed for knee pain 03/20/21   Dickie La, MD  lisinopril (ZESTRIL) 5 MG tablet Take 1 tablet (5 mg total) by mouth at bedtime. 06/02/21   Carollee Leitz, MD  tirzepatide [facility]) 5 MG/0.5ML Pen Inject 5 mg into the skin once a week. 05/19/21   Briscoe Deutscher, DO  tirzepatide [facility]) 7.5 MG/0.5ML Pen Inject 7.5 mg into the skin once a week. 05/19/21   Briscoe Deutscher, DO  Vitamin D, Ergocalciferol, (DRISDOL) 1.25 MG (50000 UNIT) CAPS capsule Take 1 capsule (50,000 Units total) by mouth every 7 (seven) days. 04/29/21   [place], Joneen Boers, FNP    Allergies    Patient has no known allergies.  Review of Systems   Review of  Systems  Neurological:  Positive for headaches.  All other systems reviewed and are negative.  Physical Exam Updated Vital Signs BP (!) 160/95    Pulse 90    Temp 97.8 F (36.6 C)    Resp 16    Ht '5\' 4"'  (1.626 m)    Wt (!) 142.9 kg    SpO2 100%    BMI 54.07 kg/m   Physical Exam Vitals and nursing note reviewed.  60 year old female, resting comfortably and in no acute distress. Vital signs are significant for elevated blood pressure. Oxygen saturation is 100%, which is normal. Head is normocephalic and atraumatic. PERRLA, EOMI. Oropharynx is clear.  Fundi show no hemorrhage, exudate, papilledema.  There is tenderness to palpation of the insertion of the right paracervical muscles. Neck is tender along the right paracervical and sternocleidomastoid muscles.  Neck is supple.  There is no adenopathy. Back is nontender and there is no CVA tenderness. Lungs are clear without rales, wheezes, or rhonchi. Chest is nontender. Heart has regular rate and rhythm without murmur. Abdomen is soft, flat, nontender without masses or hepatosplenomegaly and peristalsis is normoactive. Extremities have no cyanosis or edema, full range of motion is present. Skin is warm and dry without rash. Neurologic: Mental status is normal, cranial nerves are intact, strength is 5/5 in all 4 extremities.  There are no sensory deficits.  ED Results / Procedures / Treatments    Procedures Procedures   Medications Ordered in ED Medications  ketorolac (TORADOL) 30 MG/ML injection 30 mg (has no administration in time range)  cyclobenzaprine (FLEXERIL) tablet 10 mg (has no administration in time range)  lactated ringers bolus 1,000 mL (1,000 mLs Intravenous New Bag/Given 06/25/21 0152)  prochlorperazine (COMPAZINE) injection 10 mg (10 mg Intravenous Given 06/25/21 0148)  diphenhydrAMINE (BENADRYL) injection 25 mg (25 mg Intravenous Given 06/25/21 0140)  dexamethasone (DECADRON) injection 10 mg (10 mg Intravenous Given  06/25/21 0145)    ED Course  I have reviewed the triage vital signs and the nursing notes.  Pertinent labs & imaging results that were available during my care of the patient were reviewed by me and considered in my medical decision making (see chart for details).    MDM Rules/Calculators/A&P                         Headache which seems most likely to be a muscle contraction headache.  No red flags to  suggest more serious pathology such as subarachnoid hemorrhage, meningitis, tumor.  Old records are reviewed, and she has no relevant past visits.  She will be given a headache cocktail of lactated Ringer solution, prochlorperazine, diphenhydramine, dexamethasone and reassessed.  She had partial relief of headache with above-noted treatment.  She is given a dose of cyclobenzaprine and ketorolac and is discharged with prescription for cyclobenzaprine.  Advised to use over-the-counter NSAIDs and acetaminophen as well as apply ice.  Follow-up with primary care provider.  Return precautions discussed.  Final Clinical Impression(s) / ED Diagnoses Final diagnoses:  Bad headache  Elevated blood pressure reading with diagnosis of hypertension    Rx / DC Orders ED Discharge Orders          Ordered    cyclobenzaprine (FLEXERIL) 10 MG tablet  3 times daily PRN        06/25/21 3646             Delora Fuel, MD 80/32/12 [phone]

## 2021-06-30 ENCOUNTER — Ambulatory Visit: Payer: BC Managed Care – PPO

## 2021-06-30 NOTE — Progress Notes (Deleted)
° ° °  SUBJECTIVE:   CHIEF COMPLAINT / HPI: F/U HTN  Patient was recently evaluated in the ED on 4/29 for headache, found to have elevated BP 160/95.  Medications include lisinopril 5 mg.  PERTINENT  PMH / PSH: HTN, T2DM, HLD  OBJECTIVE:   There were no vitals taken for this visit.  General: ***, NAD CV: RRR, no murmurs*** Pulm: CTAB, no wheezes or rales  ASSESSMENT/PLAN:   No problem-specific Assessment & Plan notes found for this encounter.     Zola Button, MD [place]   {    This will disappear when note is signed, click to select method of visit    :1}

## 2021-07-03 ENCOUNTER — Other Ambulatory Visit (INDEPENDENT_AMBULATORY_CARE_PROVIDER_SITE_OTHER): Payer: Self-pay | Admitting: Family Medicine

## 2021-07-03 DIAGNOSIS — E559 Vitamin D deficiency, unspecified: Secondary | ICD-10-CM

## 2021-07-06 NOTE — Telephone Encounter (Signed)
LOV w/ Dr. Juleen China

## 2021-07-13 ENCOUNTER — Encounter (INDEPENDENT_AMBULATORY_CARE_PROVIDER_SITE_OTHER): Payer: Self-pay | Admitting: Family Medicine

## 2021-07-13 ENCOUNTER — Other Ambulatory Visit: Payer: Self-pay

## 2021-07-13 ENCOUNTER — Telehealth: Payer: Self-pay

## 2021-07-13 ENCOUNTER — Ambulatory Visit (INDEPENDENT_AMBULATORY_CARE_PROVIDER_SITE_OTHER): Payer: BC Managed Care – PPO | Admitting: Family Medicine

## 2021-07-13 VITALS — BP 136/78 | HR 91 | Temp 97.9°F | Ht 64.0 in | Wt 317.0 lb

## 2021-07-13 DIAGNOSIS — E1159 Type 2 diabetes mellitus with other circulatory complications: Secondary | ICD-10-CM

## 2021-07-13 DIAGNOSIS — E669 Obesity, unspecified: Secondary | ICD-10-CM

## 2021-07-13 DIAGNOSIS — M170 Bilateral primary osteoarthritis of knee: Secondary | ICD-10-CM

## 2021-07-13 DIAGNOSIS — I152 Hypertension secondary to endocrine disorders: Secondary | ICD-10-CM

## 2021-07-13 DIAGNOSIS — Z6841 Body Mass Index (BMI) 40.0 and over, adult: Secondary | ICD-10-CM

## 2021-07-13 DIAGNOSIS — Z9189 Other specified personal risk factors, not elsewhere classified: Secondary | ICD-10-CM

## 2021-07-13 DIAGNOSIS — E1169 Type 2 diabetes mellitus with other specified complication: Secondary | ICD-10-CM | POA: Diagnosis not present

## 2021-07-13 DIAGNOSIS — E559 Vitamin D deficiency, unspecified: Secondary | ICD-10-CM

## 2021-07-13 MED ORDER — TIRZEPATIDE 7.5 MG/0.5ML ~~LOC~~ SOAJ: 7.5000 mg | SUBCUTANEOUS | 0 refills | Status: DC

## 2021-07-13 MED ORDER — HYDROCODONE-ACETAMINOPHEN 10-325 MG PO TABS: ORAL_TABLET | ORAL | 0 refills | Status: DC

## 2021-07-13 MED ORDER — VITAMIN D (ERGOCALCIFEROL) 1.25 MG (50000 UNIT) PO CAPS: 50000.0000 [IU] | ORAL_CAPSULE | ORAL | 0 refills | Status: DC

## 2021-07-13 NOTE — Telephone Encounter (Signed)
done

## 2021-07-14 NOTE — Progress Notes (Signed)
Chief Complaint:   OBESITY Valerie Carter is here to discuss her progress with her obesity treatment plan along with follow-up of her obesity related diagnoses. [name] is on the Category 2 Plan and states she is following her eating plan approximately 30% of the time. [name] states she is not currently exercising.  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 She rarely eats breakfast, and she eats off the plan for lunch. For an example, she will have peanut butter crackers or a Mr. Donell Sievert for lunch. She is a school bus driver, and she finds putting herself  first to be difficult.  Subjective:   1. Type 2 diabetes mellitus with other specified complication, without long-term current use of insulin (HCC) [name] was started on [name] 7.5 mg last week. She notes occasional nausea when she skips meals, but overall she is tolerating ok. She is not checking her blood sugars, but her last A1c was 5.6 on 05/18/2021. She is without symptoms or concerns.  2. Hypertension associated with type 2 diabetes mellitus ([place]) Ladeja's blood pressure is at goal today. Cardiovascular ROS: no chest pain or dyspnea on exertion.  BP Readings from Last 3 Encounters:  07/13/21 136/78  06/25/21 (!) 176/105  06/02/21 138/88   3. Vitamin D deficiency Valerie Carter is currently taking prescription vitamin D 50,000 IU each week. She denies nausea, vomiting or muscle weakness.  4. At risk for malnutrition Valerie Carter is at increased risk for malnutrition due to deficient food intake.  Assessment/Plan:  No orders of the defined types were placed in this encounter.   Medications Discontinued During This Encounter  Medication Reason   tirzepatide  [facility]) 5 MG/0.5ML Pen    Vitamin D, Ergocalciferol, (DRISDOL) 1.25 MG (50000 UNIT) CAPS capsule Reorder   tirzepatide ([name]) 7.5 MG/0.5ML Pen Reorder     Meds ordered this encounter  Medications   Vitamin D, Ergocalciferol, (DRISDOL) 1.25 MG (50000 UNIT) CAPS capsule    Sig: Take 1 capsule (50,000 Units total) by mouth every 7 (seven) days.    Dispense:  4 capsule    Refill:  0   tirzepatide ([name]) 7.5 MG/0.5ML Pen    Sig: Inject 7.5 mg into the skin once a week.    Dispense:  2 mL    Refill:  0     1. Type 2 diabetes mellitus with other specified complication, without long-term current use of insulin (HCC) Valerie Carter will continue her medications, and we will refill [name] 7.5 mg for 1 month. Good blood sugar control is important to decrease the likelihood of diabetic complications such as nephropathy, neuropathy, limb loss, blindness, coronary artery disease, and death. Intensive lifestyle modification including diet, exercise and weight loss are the first line of treatment for diabetes.   - tirzepatide ([name]) 7.5 MG/0.5ML Pen; Inject 7.5 mg into the skin once a week.  Dispense: 2 mL; Refill: 0  2. Hypertension associated with type 2 diabetes mellitus ([place]) Valerie Carter will continue Zestril as prescribed. She will continue working on healthy weight loss and exercise to improve blood pressure control. We will watch for signs of hypotension as she continues her lifestyle modifications.  3. Vitamin D deficiency Low Vitamin D level contributes to fatigue and are associated  with obesity, breast, and colon cancer. We will refill prescription Vitamin D for 1 month. Valerie Carter will follow-up for routine testing of Vitamin D, at least 2-3 times per year to avoid over-replacement.  - Vitamin D, Ergocalciferol, (DRISDOL) 1.25 MG (50000 UNIT) CAPS capsule; Take 1 capsule (50,000 Units total) by mouth every 7 (seven) days.  Dispense: 4 capsule; Refill: 0  4. At risk for malnutrition Valerie Carter  was given extensive malnutrition prevention education and counseling today of more than 9 minutes.  Counseled her that malnutrition refers to inappropriate nutrients or not the right balance of nutrients for optimal health.  Discussed with Valerie Carter that it is absolutely possible to be malnourished but yet obese.  Risk factors, including but not limited to, inappropriate dietary choices, difficulty with obtaining food due to physical or financial limitations, and various physical and mental health conditions were reviewed with Valerie Carter.   5. Obesity with current BMI of 54.5 Valerie Carter is currently in the action stage of change. As such, her goal is to continue with weight loss efforts. She has agreed to the Category 2 Plan.   Strategies were discussed with the patient to help get all of her food in.  Exercise goals: As is, she uses a cane and has physical limitations.  Behavioral modification strategies: no skipping meals and meal planning and cooking strategies.  [name] has agreed to follow-up with our clinic in 4 weeks with Dr. Juleen China. She was informed of the importance of frequent follow-up visits to maximize her success with intensive lifestyle modifications for her multiple health conditions.   Objective:   Blood pressure 136/78, pulse 91, temperature 97.9 F (36.6 C), height 5\' 4"  (1.626 m), weight (!) 317 lb (143.8 kg), SpO2 100 %. Body mass index is 54.41 kg/m.  General: Cooperative, alert, well developed, in no acute distress. HEENT: Conjunctivae and lids unremarkable. Cardiovascular: Regular rhythm.  Lungs: Normal work of breathing. Neurologic: No focal deficits.   Lab Results  Component Value Date   CREATININE 0.72 06/02/2021   BUN 19 06/02/2021   NA 142 06/02/2021   K 4.9 06/02/2021   CL 106 06/02/2021   CO2 18 (L) 06/02/2021   Lab Results  Component Value Date   ALT 21 05/18/2021   AST 26 05/18/2021   ALKPHOS 116 05/18/2021   BILITOT 0.8  05/18/2021   Lab Results  Component Value Date   HGBA1C 5.6 05/18/2021   HGBA1C 6.9 (H) 09/10/2020   HGBA1C 6.0 03/28/2020   HGBA1C 5.5 02/10/2018   HGBA1C 6.7 05/02/2017   Lab Results  Component Value Date   INSULIN 14.8 09/10/2020   Lab Results  Component Value Date   TSH 0.872 09/10/2020   Lab Results  Component Value Date   CHOL 203 (H) 05/18/2021   HDL 73 05/18/2021   LDLCALC 121 (H) 05/18/2021   LDLDIRECT 114 (H) 09/07/2011   TRIG 49 05/18/2021   CHOLHDL 2.8 05/18/2021   Lab Results  Component Value Date   VD25OH 49.9 05/18/2021   VD25OH 41.2 09/10/2020   VD25OH 29 (L) 12/29/2015   Lab Results  Component Value Date   WBC 7.3 05/18/2021   HGB 13.0 05/18/2021   HCT 40.3 05/18/2021   MCV 77 (L) 05/18/2021   PLT 250 05/18/2021   Lab Results  Component Value Date   IRON 31 (L) 09/11/2015   TIBC 287 09/11/2015   FERRITIN 56 09/11/2015   Attestation Statements:   Reviewed by clinician on day of visit: allergies,  medications, problem list, medical history, surgical history, family history, social history, and previous encounter notes.   Wilhemena Durie, am acting as transcriptionist for [employer], DO.  I have reviewed the above documentation for accuracy and completeness, and I agree with the above. Marjory Sneddon, D.O.  The [place] was signed into law in 2016 which includes the topic of electronic health records.  This provides immediate access to information in MyChart.  This includes consultation notes, operative notes, office notes, lab results and pathology reports.  If you have any questions about what you read please let us know at your next visit so we can discuss your concerns and take corrective action if need be.  We are right here with you.

## 2021-07-31 ENCOUNTER — Other Ambulatory Visit (INDEPENDENT_AMBULATORY_CARE_PROVIDER_SITE_OTHER): Payer: Self-pay | Admitting: Family Medicine

## 2021-07-31 DIAGNOSIS — E559 Vitamin D deficiency, unspecified: Secondary | ICD-10-CM

## 2021-08-07 ENCOUNTER — Ambulatory Visit (INDEPENDENT_AMBULATORY_CARE_PROVIDER_SITE_OTHER): Payer: BC Managed Care – PPO | Admitting: Family Medicine

## 2021-08-07 DIAGNOSIS — M75100 Unspecified rotator cuff tear or rupture of unspecified shoulder, not specified as traumatic: Secondary | ICD-10-CM

## 2021-08-07 MED ORDER — METHYLPREDNISOLONE ACETATE 40 MG/ML IJ SUSP
40.0000 mg | Freq: Once | INTRAMUSCULAR | Status: AC
  Administered 2021-08-07: 40 mg via INTRA_ARTICULAR

## 2021-08-09 NOTE — Progress Notes (Signed)
°  Valerie Carter - 61 y.o. female MRN [id number]  Date of birth: April 23, 1961    SUBJECTIVE:      Chief Complaint:/ HPI:   Bilateral shulder pain. Worse over last month. Has had injection last year into right one and it really helped. She would like to have one in each shoulder today.     OBJECTIVE: BP (!) 153/91    Ht 5\' 5"  (1.651 m)    Wt (!) 317 lb (143.8 kg)    BMI 52.75 kg/m   Physical Exam:  Vital signs are reviewed. WD WN NAD Shoulder symmetrical. Limited ABduction bilaterally, right to 100 degrees and left to 80 degrees. IR normal on right and significanlt reduced on left secondary to pain. NEUWO Distally she is NV intact  PROCEDURE: INJECTION: Patient was given informed consent, signed copy in the chart. Appropriate time out was taken. Area prepped and draped in usual sterile fashion. Ethyl chloride was  used for local anesthesia. A 21 gauge 1 1/2 inch needle was used.. 1 cc of methylprednisolone 40 mg/ml plus  3 cc of 1% lidocaine without epinephrine was injected into the bilateral subacromial shoulder bursa using a(n) posterior approach.   The patient tolerated the procedure well. There were no complications. Post procedure instructions were given.   ASSESSMENT & PLAN:  See problem based charting & AVS for pt instructions. Rotator cuff syndrome Bulateral CSI today. HEP and rtc prn. She is aware we try to avoid frequent CSi in this area.

## 2021-08-09 NOTE — Assessment & Plan Note (Signed)
Bulateral CSI today. HEP and rtc prn. She is aware we try to avoid frequent CSi in this area.

## 2021-08-17 ENCOUNTER — Encounter (INDEPENDENT_AMBULATORY_CARE_PROVIDER_SITE_OTHER): Payer: Self-pay

## 2021-08-20 ENCOUNTER — Other Ambulatory Visit: Payer: Self-pay

## 2021-08-20 ENCOUNTER — Encounter (INDEPENDENT_AMBULATORY_CARE_PROVIDER_SITE_OTHER): Payer: Self-pay | Admitting: Family Medicine

## 2021-08-20 ENCOUNTER — Ambulatory Visit (INDEPENDENT_AMBULATORY_CARE_PROVIDER_SITE_OTHER): Payer: BC Managed Care – PPO | Admitting: Family Medicine

## 2021-08-20 VITALS — BP 132/81 | HR 91 | Temp 97.8°F | Ht 64.0 in | Wt 315.0 lb

## 2021-08-20 DIAGNOSIS — M25562 Pain in left knee: Secondary | ICD-10-CM | POA: Diagnosis not present

## 2021-08-20 DIAGNOSIS — E1169 Type 2 diabetes mellitus with other specified complication: Secondary | ICD-10-CM | POA: Diagnosis not present

## 2021-08-20 DIAGNOSIS — E559 Vitamin D deficiency, unspecified: Secondary | ICD-10-CM | POA: Diagnosis not present

## 2021-08-20 DIAGNOSIS — G8929 Other chronic pain: Secondary | ICD-10-CM

## 2021-08-20 DIAGNOSIS — Z6841 Body Mass Index (BMI) 40.0 and over, adult: Secondary | ICD-10-CM

## 2021-08-20 DIAGNOSIS — E669 Obesity, unspecified: Secondary | ICD-10-CM

## 2021-08-20 DIAGNOSIS — M25561 Pain in right knee: Secondary | ICD-10-CM | POA: Diagnosis not present

## 2021-08-20 DIAGNOSIS — Z7985 Long-term (current) use of injectable non-insulin antidiabetic drugs: Secondary | ICD-10-CM

## 2021-08-20 DIAGNOSIS — F418 Other specified anxiety disorders: Secondary | ICD-10-CM

## 2021-08-20 MED ORDER — VITAMIN D (ERGOCALCIFEROL) 1.25 MG (50000 UNIT) PO CAPS: 50000.0000 [IU] | ORAL_CAPSULE | ORAL | 0 refills | Status: DC

## 2021-08-20 MED ORDER — TIRZEPATIDE 15 MG/0.5ML ~~LOC~~ SOAJ: 15.0000 mg | SUBCUTANEOUS | 1 refills | Status: DC

## 2021-08-20 MED ORDER — TIRZEPATIDE 12.5 MG/0.5ML ~~LOC~~ SOAJ: 12.5000 mg | SUBCUTANEOUS | 1 refills | Status: DC

## 2021-08-20 MED ORDER — TIRZEPATIDE 10 MG/0.5ML ~~LOC~~ SOAJ: 10.0000 mg | SUBCUTANEOUS | 1 refills | Status: DC

## 2021-08-25 NOTE — Progress Notes (Signed)
Chief Complaint:   OBESITY Valerie Carter is here to discuss her progress with her obesity treatment plan along with follow-up of her obesity related diagnoses. See Medical Weight Management Flowsheet for complete bioelectrical impedance results.  Today's visit was #: 12 Starting weight: 336 lbs Starting date: 08/19/2020 Weight change since last visit: 2 lbs Total lbs lost to date: 21 lbs Total weight loss percentage to date: -6.25%  Nutrition Plan: Category 2 Plan for 85% of the time.  Activity: Increased activity. Anti-obesity medications: [name] 7.5 mg subcutaneously weekly. Reported side effects: None.  Interim History: [place] endorses polyphagia.  Assessment/Plan:   1. Type 2 diabetes mellitus with other specified complication, without long-term current use of insulin (HCC) Diabetes Mellitus: Controlled. Medication: [name] 7.5 mg subcutaneously weekly.  Tolerating well.  Still with polyphagia. Issues reviewed: blood sugar goals, complications of diabetes mellitus, hypoglycemia prevention and treatment, exercise, and nutrition.  Plan: Increase [name] to 10 mg subcutaneously weekly.  Will also send in 12.5 mg and 15 mg doses.  The patient will continue to focus on protein-rich, low simple carbohydrate foods. We reviewed the importance of hydration, regular exercise for stress reduction, and restorative sleep.   Lab Results  Component Value Date   HGBA1C 5.6 05/18/2021   HGBA1C 6.9 (H) 09/10/2020   HGBA1C 6.0 03/28/2020   Lab Results  Component Value Date   MICROALBUR 0.8 02/06/2015   LDLCALC 121 (H) 05/18/2021   CREATININE 0.72 06/02/2021   - Increase tirzepatide ([name]) 10 MG/0.5ML Pen; Inject 10 mg into the skin once a week.  Dispense: 2 mL; Refill: 1 - tirzepatide ([name]) 12.5 MG/0.5ML Pen; Inject 12.5 mg into the skin once a week.  Dispense: 2 mL; Refill: 1 - tirzepatide ([name]) 15 MG/0.5ML Pen; Inject 15 mg into the skin once a week.  Dispense: 2 mL;  Refill: 1  2. Bilateral chronic knee pain Worsening.  Using a cane with ambulation.  3. Vitamin D deficiency Improving, but not optimized. She is taking vitamin D 50,000 IU weekly.  Plan: Continue to take prescription Vitamin D @50 ,000 IU every week as prescribed.  Follow-up for routine testing of Vitamin D, at least 2-3 times per year to avoid over-replacement.  Lab Results  Component Value Date   VD25OH 49.9 05/18/2021   VD25OH 41.2 09/10/2020   VD25OH 29 (L) 12/29/2015   - Refill Vitamin D, Ergocalciferol, (DRISDOL) 1.25 MG (50000 UNIT) CAPS capsule; Take 1 capsule (50,000 Units total) by mouth every 7 (seven) days.  Dispense: 4 capsule; Refill: 0  4. Situational anxiety Improving.  Still helping aunt and uncle, but aunt is in SNF.  5. Obesity with current BMI of 54.2  Course: Valerie Carter is currently in the action stage of change. As such, her goal is to continue with weight loss efforts.   Nutrition goals: She has agreed to the Category 2 Plan.   Exercise goals:  As is.  Behavioral modification strategies: increasing lean protein intake, decreasing simple carbohydrates, increasing vegetables, increasing water intake, and decreasing liquid calories.  [name] has agreed to follow-up with our clinic in 4 weeks. She was informed of the importance of frequent follow-up visits to maximize her success with intensive lifestyle modifications for her multiple health conditions.   Objective:   Blood pressure 132/81, pulse 91, temperature 97.8 F (36.6 C), temperature source Oral, height 5\' 4"  (1.626 m), weight (!) 315 lb (142.9 kg), SpO2 97 %. Body mass index is 54.07 kg/m.  General: Cooperative, alert, well developed, in no  acute distress. HEENT: Conjunctivae and lids unremarkable. Cardiovascular: Regular rhythm.  Lungs: Normal work of breathing. Neurologic: No focal deficits.   Lab Results  Component Value Date   CREATININE 0.72 06/02/2021   BUN 19 06/02/2021   NA 142  06/02/2021   K 4.9 06/02/2021   CL 106 06/02/2021   CO2 18 (L) 06/02/2021   Lab Results  Component Value Date   ALT 21 05/18/2021   AST 26 05/18/2021   ALKPHOS 116 05/18/2021   BILITOT 0.8 05/18/2021   Lab Results  Component Value Date   HGBA1C 5.6 05/18/2021   HGBA1C 6.9 (H) 09/10/2020   HGBA1C 6.0 03/28/2020   HGBA1C 5.5 02/10/2018   HGBA1C 6.7 05/02/2017   Lab Results  Component Value Date   INSULIN 14.8 09/10/2020   Lab Results  Component Value Date   TSH 0.872 09/10/2020   Lab Results  Component Value Date   CHOL 203 (H) 05/18/2021   HDL 73 05/18/2021   LDLCALC 121 (H) 05/18/2021   LDLDIRECT 114 (H) 09/07/2011   TRIG 49 05/18/2021   CHOLHDL 2.8 05/18/2021   Lab Results  Component Value Date   VD25OH 49.9 05/18/2021   VD25OH 41.2 09/10/2020   VD25OH 29 (L) 12/29/2015   Lab Results  Component Value Date   WBC 7.3 05/18/2021   HGB 13.0 05/18/2021   HCT 40.3 05/18/2021   MCV 77 (L) 05/18/2021   PLT 250 05/18/2021   Lab Results  Component Value Date   IRON 31 (L) 09/11/2015   TIBC 287 09/11/2015   FERRITIN 56 09/11/2015   Attestation Statements:   Reviewed by clinician on day of visit: allergies, medications, problem list, medical history, surgical history, family history, social history, and previous encounter notes.  I, Water quality scientist, CMA, am acting as transcriptionist for Briscoe Deutscher, DO  I have reviewed the above documentation for accuracy and completeness, and I agree with the above. -  Briscoe Deutscher, DO, MS, FAAFP, DABOM - Family and Bariatric Medicine.

## 2021-08-26 ENCOUNTER — Other Ambulatory Visit: Payer: Self-pay | Admitting: Family Medicine

## 2021-08-28 ENCOUNTER — Other Ambulatory Visit (INDEPENDENT_AMBULATORY_CARE_PROVIDER_SITE_OTHER): Payer: Self-pay | Admitting: Family Medicine

## 2021-08-28 DIAGNOSIS — E559 Vitamin D deficiency, unspecified: Secondary | ICD-10-CM

## 2021-08-31 NOTE — Telephone Encounter (Signed)
Dr.Wallace °

## 2021-09-18 ENCOUNTER — Ambulatory Visit (INDEPENDENT_AMBULATORY_CARE_PROVIDER_SITE_OTHER): Payer: BC Managed Care – PPO | Admitting: Family Medicine

## 2021-09-18 ENCOUNTER — Encounter: Payer: Self-pay | Admitting: Family Medicine

## 2021-09-18 VITALS — BP 124/50 | Ht 65.0 in | Wt 315.0 lb

## 2021-09-18 DIAGNOSIS — M7542 Impingement syndrome of left shoulder: Secondary | ICD-10-CM

## 2021-09-18 MED ORDER — GABAPENTIN 100 MG PO CAPS: 100.0000 mg | ORAL_CAPSULE | Freq: Every day | ORAL | 1 refills | Status: DC

## 2021-09-18 NOTE — Progress Notes (Signed)
?  Valerie Carter - 61 y.o. female MRN [id number]  Date of birth: 02-03-1961 ? ? ? ?SUBJECTIVE:    ?  ?Chief Complaint:/ HPI:  ?Follow-up bilateral shoulder pain.  At last office visit we tried bilateral subacromial bursa injections into each shoulder.  She had relief for about a week then she started having return of her pain.  She is also continue to have upper back pain it is keeping her awake at night.  This is the biggest problem is that she is not getting decent sleep at night. ? ?Continues to be successful in her weight loss program. ? ? ? ?OBJECTIVE: BP (!) 124/50   Ht '5\' 5"'$  (1.651 m)   Wt (!) 315 lb (142.9 kg)   BMI 52.42 kg/m?   ?Physical Exam:  Vital signs are reviewed. ?GENERAL: Well-developed female no acute distress ?SHOULDERS: Bilaterally are symmetrical.  She has pain with abduction above 100 degrees on the left and above 90 degrees on the right.  Forward flexion is painless to 100 degrees on the left and 110 degrees on the right.  Bicipital tendon is not tender on either shoulder.  There is no erythema or unusual warmth of either shoulder. ?BACK: She is tender to palpation over the rhomboid and scapular muscles. ? ?ASSESSMENT & PLAN: ?#1.  Bilateral shoulder pain most likely arthritis.  She did not benefit from subacromial bursa injection.  Potentially she could have some benefit from glenohumeral joint injection but we would like to try home exercise program for 1 month and have her return to clinic.  We will try low-dose gabapentin for her nighttime pain.  She has previously tolerated gabapentin except that it made her sleepy.  We had her right 900 mg daily dose so we will start very low.  She is amenable to this plan.  Home exercise program given emphasizing scapular retraction exercises, overhead press.  She is also encouraged to do scapular retraction throughout the day to ease any cramping pain. ?See problem based charting & AVS for pt instructions. ?No problem-specific Assessment & Plan  notes found for this encounter. ? ?

## 2021-09-18 NOTE — Patient Instructions (Signed)
Lets try the lower dose gabapentin at night and let me see you in about a month. I am giving you some exercises to do once or twice a day. One of them you can do any time your back starts to hurt (scapular retraction) ? ?You are doing GREAT with weight loss! ?

## 2021-09-21 ENCOUNTER — Other Ambulatory Visit: Payer: Self-pay

## 2021-09-21 ENCOUNTER — Ambulatory Visit (INDEPENDENT_AMBULATORY_CARE_PROVIDER_SITE_OTHER): Payer: BC Managed Care – PPO | Admitting: Family Medicine

## 2021-09-21 ENCOUNTER — Encounter (INDEPENDENT_AMBULATORY_CARE_PROVIDER_SITE_OTHER): Payer: Self-pay | Admitting: Family Medicine

## 2021-09-21 VITALS — BP 159/95 | HR 100 | Temp 98.0°F | Ht 65.0 in | Wt 314.0 lb

## 2021-09-21 DIAGNOSIS — I152 Hypertension secondary to endocrine disorders: Secondary | ICD-10-CM

## 2021-09-21 DIAGNOSIS — Z6841 Body Mass Index (BMI) 40.0 and over, adult: Secondary | ICD-10-CM

## 2021-09-21 DIAGNOSIS — E1159 Type 2 diabetes mellitus with other circulatory complications: Secondary | ICD-10-CM

## 2021-09-21 DIAGNOSIS — M7542 Impingement syndrome of left shoulder: Secondary | ICD-10-CM | POA: Diagnosis not present

## 2021-09-21 DIAGNOSIS — G4709 Other insomnia: Secondary | ICD-10-CM

## 2021-09-21 DIAGNOSIS — Z7985 Long-term (current) use of injectable non-insulin antidiabetic drugs: Secondary | ICD-10-CM

## 2021-09-21 DIAGNOSIS — F418 Other specified anxiety disorders: Secondary | ICD-10-CM

## 2021-09-21 DIAGNOSIS — E669 Obesity, unspecified: Secondary | ICD-10-CM

## 2021-09-21 DIAGNOSIS — E1169 Type 2 diabetes mellitus with other specified complication: Secondary | ICD-10-CM

## 2021-09-22 MED ORDER — TIRZEPATIDE 10 MG/0.5ML ~~LOC~~ SOAJ: 10.0000 mg | SUBCUTANEOUS | 0 refills | Status: DC

## 2021-09-22 MED ORDER — PROPRANOLOL HCL 20 MG PO TABS: 20.0000 mg | ORAL_TABLET | Freq: Three times a day (TID) | ORAL | 0 refills | Status: DC | PRN

## 2021-09-22 MED ORDER — TRAZODONE HCL 50 MG PO TABS: 25.0000 mg | ORAL_TABLET | Freq: Every evening | ORAL | 2 refills | Status: DC | PRN

## 2021-09-25 ENCOUNTER — Other Ambulatory Visit (INDEPENDENT_AMBULATORY_CARE_PROVIDER_SITE_OTHER): Payer: Self-pay | Admitting: Family Medicine

## 2021-09-25 DIAGNOSIS — E559 Vitamin D deficiency, unspecified: Secondary | ICD-10-CM

## 2021-09-29 NOTE — Progress Notes (Signed)
Chief Complaint:   OBESITY Valerie Carter is here to discuss her progress with her obesity treatment plan along with follow-up of her obesity related diagnoses. See Medical Weight Management Flowsheet for complete bioelectrical impedance results.  Today's visit was #: 19 Starting weight: 336 lbs Starting date: 08/19/2020 Weight change since last visit: 1 lb Total lbs lost to date: 22 lbs Total weight loss percentage to date: -6.55%  Nutrition Plan: Category 2 Plan for 60% of the time.  Activity: None. Anti-obesity medications: [name] mg subcutaneously weekly. Reported side effects: None.  Interim History: Stopped drinking beer x 2 days ago.  She has had increased insomnia and stress.  Assessment/Plan:   1. Type 2 diabetes mellitus with other specified complication, without long-term current use of insulin (HCC) Diabetes Mellitus: At goal. Medication: [name] 7.5 mg subcutaneously weekly. Issues reviewed: blood sugar goals, complications of diabetes mellitus, hypoglycemia prevention and treatment, exercise, and nutrition.  Plan: Increase [name] to 10 mg subcutaneously weekly.  The patient will continue to focus on protein-rich, low simple carbohydrate foods. We reviewed the importance of hydration, regular exercise for stress reduction, and restorative sleep.   Lab Results  Component Value Date   HGBA1C 5.6 05/18/2021   HGBA1C 6.9 (H) 09/10/2020   HGBA1C 6.0 03/28/2020   Lab Results  Component Value Date   MICROALBUR 0.8 02/06/2015   LDLCALC 121 (H) 05/18/2021   CREATININE 0.72 06/02/2021   - Increase tirzepatide ([name]) 10 MG/0.5ML Pen; Inject 10 mg into the skin once a week.  Dispense: 6 mL; Refill: 0  2. Hypertension associated with type 2 diabetes mellitus (HCC) High Today. Medications: propranolol 20 mg three times daily as needed for anxiety.   Plan: Avoid buying foods that are: processed, frozen, or prepackaged to avoid excess salt. We will watch for signs of  hypotension as she continues lifestyle modifications.  BP Readings from Last 3 Encounters:  09/21/21 (!) 159/95  09/18/21 (!) 124/50  08/20/21 132/81   Lab Results  Component Value Date   CREATININE 0.72 06/02/2021   3. Rotator cuff impingement syndrome of left shoulder She is followed by Dr. Nori Riis.  Started gabapentin 100 mg at night.  Helpful.  4. Other insomnia This is poorly controlled. Current treatment: None.  Plan: Recommend sleep hygiene measures including regular sleep schedule, optimal sleep environment, and relaxing presleep rituals.  Melatonin is important to help your body get ready to sleep. Unfortunately, light from screens can decrease melatonin production. Try taking 1 mg of OTC melatonin, around 2-4 hours prior to bedtime.   - Start traZODone (DESYREL) 50 MG tablet; Take 0.5-1 tablets (25-50 mg total) by mouth at bedtime as needed for sleep.  Dispense: 30 tablet; Refill: 2  5. Situational anxiety Start propranolol 20 mg three times daily as needed for anxiety.  - Start propranolol (INDERAL) 20 MG tablet; Take 1 tablet (20 mg total) by mouth 3 (three) times daily as needed.  Dispense: 60 tablet; Refill: 0  6. Obesity with current BMI of 52.4  Course: Valerie Carter is currently in the action stage of change. As such, her goal is to continue with weight loss efforts.   Nutrition goals: She has agreed to the Category 2 Plan.   Exercise goals:  As is.  Behavioral modification strategies: increasing lean protein intake, decreasing simple carbohydrates, and increasing vegetables.  Valerie Carter has agreed to follow-up with our clinic in 5 weeks. She was informed of the importance of frequent follow-up visits to maximize her success with intensive  lifestyle modifications for her multiple health conditions.   Objective:   Blood pressure (!) 159/95, pulse 100, temperature 98 F (36.7 C), temperature source Oral, height '5\' 5"'$  (1.651 m), weight (!) 314 lb (142.4 kg), SpO2 99 %. Body  mass index is 52.25 kg/m.  General: Cooperative, alert, well developed, in no acute distress. HEENT: Conjunctivae and lids unremarkable. Cardiovascular: Regular rhythm.  Lungs: Normal work of breathing. Neurologic: No focal deficits.   Lab Results  Component Value Date   CREATININE 0.72 06/02/2021   BUN 19 06/02/2021   NA 142 06/02/2021   K 4.9 06/02/2021   CL 106 06/02/2021   CO2 18 (L) 06/02/2021   Lab Results  Component Value Date   ALT 21 05/18/2021   AST 26 05/18/2021   ALKPHOS 116 05/18/2021   BILITOT 0.8 05/18/2021   Lab Results  Component Value Date   HGBA1C 5.6 05/18/2021   HGBA1C 6.9 (H) 09/10/2020   HGBA1C 6.0 03/28/2020   HGBA1C 5.5 02/10/2018   HGBA1C 6.7 05/02/2017   Lab Results  Component Value Date   INSULIN 14.8 09/10/2020   Lab Results  Component Value Date   TSH 0.872 09/10/2020   Lab Results  Component Value Date   CHOL 203 (H) 05/18/2021   HDL 73 05/18/2021   LDLCALC 121 (H) 05/18/2021   LDLDIRECT 114 (H) 09/07/2011   TRIG 49 05/18/2021   CHOLHDL 2.8 05/18/2021   Lab Results  Component Value Date   VD25OH 49.9 05/18/2021   VD25OH 41.2 09/10/2020   VD25OH 29 (L) 12/29/2015   Lab Results  Component Value Date   WBC 7.3 05/18/2021   HGB 13.0 05/18/2021   HCT 40.3 05/18/2021   MCV 77 (L) 05/18/2021   PLT 250 05/18/2021   Lab Results  Component Value Date   IRON 31 (L) 09/11/2015   TIBC 287 09/11/2015   FERRITIN 56 09/11/2015   Attestation Statements:   Reviewed by clinician on day of visit: allergies, medications, problem list, medical history, surgical history, family history, social history, and previous encounter notes.  I, Water quality scientist, CMA, am acting as transcriptionist for Briscoe Deutscher, DO  I have reviewed the above documentation for accuracy and completeness, and I agree with the above. -  Briscoe Deutscher, DO, MS, FAAFP, DABOM - Family and Bariatric Medicine.

## 2021-10-13 ENCOUNTER — Other Ambulatory Visit (INDEPENDENT_AMBULATORY_CARE_PROVIDER_SITE_OTHER): Payer: Self-pay | Admitting: Family Medicine

## 2021-10-13 DIAGNOSIS — F418 Other specified anxiety disorders: Secondary | ICD-10-CM

## 2021-10-15 ENCOUNTER — Encounter (INDEPENDENT_AMBULATORY_CARE_PROVIDER_SITE_OTHER): Payer: Self-pay

## 2021-10-15 ENCOUNTER — Telehealth (INDEPENDENT_AMBULATORY_CARE_PROVIDER_SITE_OTHER): Payer: Self-pay | Admitting: Family Medicine

## 2021-10-15 NOTE — Telephone Encounter (Signed)
Prior authorization approved for [facility]. Patient already uses copay card. Patient sent approval message via mychart.  ?

## 2021-12-01 ENCOUNTER — Encounter: Payer: Self-pay | Admitting: *Deleted

## 2021-12-07 DIAGNOSIS — I10 Essential (primary) hypertension: Secondary | ICD-10-CM | POA: Diagnosis not present

## 2021-12-07 DIAGNOSIS — M25512 Pain in left shoulder: Secondary | ICD-10-CM | POA: Diagnosis not present

## 2021-12-07 DIAGNOSIS — E7849 Other hyperlipidemia: Secondary | ICD-10-CM | POA: Diagnosis not present

## 2021-12-07 DIAGNOSIS — E118 Type 2 diabetes mellitus with unspecified complications: Secondary | ICD-10-CM | POA: Diagnosis not present

## 2021-12-11 ENCOUNTER — Ambulatory Visit (INDEPENDENT_AMBULATORY_CARE_PROVIDER_SITE_OTHER): Payer: BC Managed Care – PPO | Admitting: Family Medicine

## 2021-12-11 ENCOUNTER — Encounter: Payer: Self-pay | Admitting: Family Medicine

## 2021-12-11 ENCOUNTER — Ambulatory Visit: Payer: Self-pay

## 2021-12-11 VITALS — BP 123/46 | Ht 64.5 in | Wt 315.0 lb

## 2021-12-11 DIAGNOSIS — M25511 Pain in right shoulder: Secondary | ICD-10-CM | POA: Diagnosis not present

## 2021-12-11 DIAGNOSIS — G8929 Other chronic pain: Secondary | ICD-10-CM

## 2021-12-11 DIAGNOSIS — M75101 Unspecified rotator cuff tear or rupture of right shoulder, not specified as traumatic: Secondary | ICD-10-CM

## 2021-12-11 MED ORDER — METHYLPREDNISOLONE ACETATE 40 MG/ML IJ SUSP
40.0000 mg | Freq: Once | INTRAMUSCULAR | Status: AC
  Administered 2021-12-11: 40 mg via INTRA_ARTICULAR

## 2021-12-11 NOTE — Assessment & Plan Note (Signed)
GHJ injection under US guidance today Will f/u via phone in 2 weeks. If no  Improvement, consider inaging and possible referral to orthopedics for evaluation any surgical intervention.

## 2021-12-11 NOTE — Progress Notes (Signed)
  Valerie Carter - 61 y.o. female MRN [id number]  Date of birth: 1961/02/27    SUBJECTIVE:      Chief Complaint:/ HPI:  Continued right shoulder pain.  Subacromial bursa injection at last office visit helped for about 24 hours.  Since then continued pain, really limits her ability to get dressed.  No numbness or tingling in her hand.    OBJECTIVE: BP (!) 123/46   Ht 5' 4.5" (1.638 m)   Wt (!) 315 lb (142.9 kg)   BMI 53.23 kg/m   Physical Exam:  Vital signs are reviewed. SHOULDER: Right.  Nontender to palpation.  Pain with resisted abduction greater than 90 degrees, resisted forward flexion greater than 100 degrees.  Supraspinatus testing positive. ULTRASOUND: Limited ultrasound of the right upper extremity/shoulder for guidance for needle placement in the glenohumeral joint.  Landmarks identified.  Small amount of fluid seen in the bicipital tendon sheath.  Several areas of calcification in the supraspinatus and subscapular muscle.  Infraspinatus muscle has large area of scar in the attachment area.  Ultrasound used for guidance into the glenohumeral joint capsule.  ASSESSMENT & PLAN:  See problem based charting & AVS for pt instructions. Rotator cuff syndrome GHJ injection under US guidance today Will f/u via phone in 2 weeks. If no  Improvement, consider inaging and possible referral to orthopedics for evaluation any surgical intervention.

## 2021-12-14 ENCOUNTER — Ambulatory Visit (INDEPENDENT_AMBULATORY_CARE_PROVIDER_SITE_OTHER): Payer: BC Managed Care – PPO | Admitting: Family Medicine

## 2021-12-14 ENCOUNTER — Encounter: Payer: Self-pay | Admitting: Family Medicine

## 2021-12-14 VITALS — BP 137/111 | HR 87 | Wt 309.2 lb

## 2021-12-14 DIAGNOSIS — R002 Palpitations: Secondary | ICD-10-CM | POA: Diagnosis not present

## 2021-12-14 MED ORDER — METOPROLOL TARTRATE 25 MG PO TABS: 25.0000 mg | ORAL_TABLET | Freq: Two times a day (BID) | ORAL | 0 refills | Status: DC

## 2021-12-14 NOTE — Progress Notes (Signed)
   SUBJECTIVE:   CHIEF COMPLAINT / HPI:   Chief Complaint  Patient presents with   Palpitations     Valerie Carter is a 61 y.o. female here for:    Palpitations Pt reports feels like her heart is skipping beats. Gets flushed and hot in the face. Lasts for a few seconds. Better when laying down.  Sx started 3-4 days ago while sitting on the edge of the bed.  Has shortness of breath but denies chest pain/discomfort during the episodes. She has had 6 episodes today but the last couple of days only about 2-3 per day.    Sitting down and closes her eyes take it away. She has had palpitations before but not had lightheadedness. No sweating but "feels like heat."    Increased her dose of [name] from 10 to 12.5 mg about 2 week ago. Drinks coffee 1 cup a day in the morning. No sodas. Drinks diet green tea but none in the last 3 weeks.     PERTINENT  PMH / PSH: reviewed and updated as appropriate   OBJECTIVE:   BP (!) 137/111   Pulse 87   Wt (!) 309 lb 3.2 oz (140.3 kg)   SpO2 100%   BMI 52.25 kg/m    GEN: pleasant female, in no acute distress  CV: regular rate and irregular rhythm, no murmurs, rubs or gallops  RESP: no increased work of breathing, clear to ascultation bilaterally MSK: no LE edema, or calf tenderness SKIN: warm, dry   ASSESSMENT/PLAN:   Palpitations Pt is a 61 yo female with history of HLD, anxiety and depression who presents for palpitations for the past 4 days. Etiology uncertain. She has not taken the Propanol prescribed by Dr Juleen China for anxiety.  EKG today reassuring for that she is not in AFIB and Atrial flutter.  She does have several APCs.  Trial Metoprolol 25 mg BID. Discussed referral to cardiology and she is agreeable. Obtain CBC and CMP. She is not using any stimulants but drinks 8 oz of caffinated beverages daily.  Advised to cease coffee usage to see if this improves her symptoms. Symptoms could be related to recent increase in [place].  Asked  pt to speak with Dr Juleen China about concern.        Lyndee Hensen, DO PGY-3, [facility] 12/16/2021

## 2021-12-14 NOTE — Patient Instructions (Addendum)
For palpitations: start metoprolol for palpitations.  I referred you to a cardiologist for further evaluation. You may need to wear a heart monitor   Avoid caffeinated drinks

## 2021-12-15 LAB — COMPREHENSIVE METABOLIC PANEL WITH GFR
ALT: 15 IU/L (ref 0–32)
AST: 19 IU/L (ref 0–40)
Albumin/Globulin Ratio: 1 — ABNORMAL LOW (ref 1.2–2.2)
Albumin: 4 g/dL (ref 3.8–4.8)
Alkaline Phosphatase: 93 IU/L (ref 44–121)
BUN/Creatinine Ratio: 18 (ref 12–28)
BUN: 14 mg/dL (ref 8–27)
Bilirubin Total: 0.7 mg/dL (ref 0.0–1.2)
CO2: 21 mmol/L (ref 20–29)
Calcium: 9.6 mg/dL (ref 8.7–10.3)
Chloride: 102 mmol/L (ref 96–106)
Creatinine, Ser: 0.78 mg/dL (ref 0.57–1.00)
Globulin, Total: 3.9 g/dL (ref 1.5–4.5)
Glucose: 72 mg/dL (ref 70–99)
Potassium: 4 mmol/L (ref 3.5–5.2)
Sodium: 140 mmol/L (ref 134–144)
Total Protein: 7.9 g/dL (ref 6.0–8.5)
eGFR: 86 mL/min/1.73

## 2021-12-15 LAB — CBC
Hematocrit: 38.6 % (ref 34.0–46.6)
Hemoglobin: 11.9 g/dL (ref 11.1–15.9)
MCH: 23.3 pg — ABNORMAL LOW (ref 26.6–33.0)
MCHC: 30.8 g/dL — ABNORMAL LOW (ref 31.5–35.7)
MCV: 76 fL — ABNORMAL LOW (ref 79–97)
Platelets: 287 10*3/uL (ref 150–450)
RBC: 5.11 x10E6/uL (ref 3.77–5.28)
RDW: 14.6 % (ref 11.7–15.4)
WBC: 8 10*3/uL (ref 3.4–10.8)

## 2021-12-16 ENCOUNTER — Encounter: Payer: Self-pay | Admitting: Family Medicine

## 2021-12-16 DIAGNOSIS — R002 Palpitations: Secondary | ICD-10-CM | POA: Insufficient documentation

## 2021-12-16 NOTE — Assessment & Plan Note (Addendum)
Pt is a 61 yo female with history of HLD, anxiety and depression who presents for palpitations for the past 4 days. Etiology uncertain. She has not taken the Propanol prescribed by Dr Juleen China for anxiety.  EKG today reassuring for that she is not in AFIB and Atrial flutter.  She does have several APCs.  Trial Metoprolol 25 mg BID. Discussed referral to cardiology and she is agreeable. Obtain CBC and CMP. She is not using any stimulants but drinks 8 oz of caffinated beverages daily.  Advised to cease coffee usage to see if this improves her symptoms. Symptoms could be related to recent increase in [place].  Asked pt to speak with Dr Juleen China about concern.

## 2021-12-17 ENCOUNTER — Ambulatory Visit: Payer: BC Managed Care – PPO | Admitting: Family Medicine

## 2021-12-21 ENCOUNTER — Ambulatory Visit (INDEPENDENT_AMBULATORY_CARE_PROVIDER_SITE_OTHER): Payer: BC Managed Care – PPO | Admitting: Family Medicine

## 2021-12-21 ENCOUNTER — Telehealth: Payer: Self-pay

## 2021-12-21 DIAGNOSIS — Z5321 Procedure and treatment not carried out due to patient leaving prior to being seen by health care provider: Secondary | ICD-10-CM

## 2021-12-22 ENCOUNTER — Encounter: Payer: Self-pay | Admitting: Family Medicine

## 2021-12-22 DIAGNOSIS — Z5321 Procedure and treatment not carried out due to patient leaving prior to being seen by health care provider: Secondary | ICD-10-CM | POA: Insufficient documentation

## 2022-01-01 ENCOUNTER — Encounter (HOSPITAL_COMMUNITY): Payer: Self-pay | Admitting: *Deleted

## 2022-01-06 ENCOUNTER — Other Ambulatory Visit: Payer: Self-pay | Admitting: Family Medicine

## 2022-01-06 DIAGNOSIS — R002 Palpitations: Secondary | ICD-10-CM

## 2022-01-15 ENCOUNTER — Ambulatory Visit (INDEPENDENT_AMBULATORY_CARE_PROVIDER_SITE_OTHER): Payer: BC Managed Care – PPO | Admitting: Family Medicine

## 2022-01-15 ENCOUNTER — Encounter: Payer: Self-pay | Admitting: Family Medicine

## 2022-01-15 VITALS — BP 127/73 | Ht 64.5 in | Wt 305.0 lb

## 2022-01-15 DIAGNOSIS — Z6841 Body Mass Index (BMI) 40.0 and over, adult: Secondary | ICD-10-CM

## 2022-01-15 DIAGNOSIS — M75101 Unspecified rotator cuff tear or rupture of right shoulder, not specified as traumatic: Secondary | ICD-10-CM

## 2022-01-15 DIAGNOSIS — M170 Bilateral primary osteoarthritis of knee: Secondary | ICD-10-CM

## 2022-01-15 MED ORDER — METHYLPREDNISOLONE ACETATE 40 MG/ML IJ SUSP
40.0000 mg | Freq: Once | INTRAMUSCULAR | Status: AC
  Administered 2022-01-15: 40 mg via INTRA_ARTICULAR

## 2022-01-15 NOTE — Assessment & Plan Note (Signed)
-   Patient has had improved pain since last injection to the right shoulder on 12/11/2021, continue with home exercises and nonweightbearing activities.  We will follow-up as needed

## 2022-01-15 NOTE — Assessment & Plan Note (Signed)
>>  ASSESSMENT AND PLAN FOR ARTHRITIS OF KNEE, DEGENERATIVE WRITTEN ON 01/15/2022  9:44 AM BY TEDROWE, MICHELLE A, MD  - Acute exacerbation of chronic knee pain right greater than left.  Patient reports a slip about a month ago with increasing pain. -Patient with end-stage arthritis, currently not a surgical candidate.  Patient is working diligently on weight loss. -Patient has tried corticosteroid injections in the past, greater than 6 months ago.  She is interested in steroid injection today for cessation of her pain.  Patient was counseled, and verbalized understanding.  She tolerated procedure well. -If patient does receive relief from this injection, can consider zilretta

## 2022-01-15 NOTE — Assessment & Plan Note (Addendum)
-   Acute exacerbation of chronic knee pain right greater than left.  Patient reports a slip about a month ago with increasing pain. -Patient with end-stage arthritis, currently not a surgical candidate.  Patient is working diligently on weight loss. -Patient has tried corticosteroid injections in the past, greater than 6 months ago.  She is interested in steroid injection today for cessation of her pain.  Patient was counseled, and verbalized understanding.  She tolerated procedure well. -If patient does receive relief from this injection, can consider zilretta

## 2022-01-15 NOTE — Progress Notes (Cosign Needed Addendum)
Established Patient Office Visit  Subjective   Patient ID: Valerie Carter, female    DOB: 06/26/1961  Age: 61 y.o. MRN: [id number]  Right knee, shoulder and neck pain [name] is a 61 year old female who presents today for follow-up on right shoulder and right knee pain.  Patient had her right shoulder injected last month with steroid injection.  She reports improvement in her pain from approximately a 7 out of 10 to a 4 out of 10 for 3 and half weeks however her pain has returned.  She has been participating in some home physical therapy exercises.  She has recently started ambulating with a cane in her right hand as well secondary to her knee pain.  She reports osteoarthritis in bilateral knees over the right causing more pain on the left.  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  She did not have repeat images at that time.  She has tried ibuprofen, Tylenol with no help.  She does report that hydrocodone helps her pain both shoulder and knee.  She has been unable to participate in physical activity of her choice, walking and swimming secondary to her pain and schedule.  She is currently been spending a lot of time caring for her elderly aunt and uncle.  ROS: As listed above in HPI    Objective:     BP 127/73 (BP Location: Left Wrist, Patient Position: Sitting, Cuff Size: Large)   Ht 5' 4.5" (1.638 m)   Wt (!) 305 lb (138.3 kg)   BMI 51.54 kg/m    Physical Exam Vitals reviewed.  Constitutional:      General: She is not in acute distress.    Appearance: She is obese. She is not toxic-appearing or diaphoretic.     Comments: Ambulating with cane right hand  Neurological:     Mental Status: She is alert.    Right shoulder: No obvious deformity.  Tenderness to palpation lateral shoulder, posterior trapezius muscles, AC joint.  Patient has full active range of motion  forward flexion, abduction, painful.   Sensation intact, light touch bilateral upper extremities, pulses 2+ bilateral radial.  Positive empty can, weakness and pain. Right knee: No obvious deformity or effusion.  Tenderness to palpation of the medial joint line.  Decreased range of motion flexion and extension secondary to pain.  Strength 5 out of 5 flexion and extension.  Crepitus present.  Procedure: After informed written consent timeout was performed, patient was seated on exam table. Right knee was prepped with alcohol swab and utilizing anteromedial approach, patient's right knee was injected intraarticularly with 3:1 lidocaine: depomedrol. Patient tolerated the procedure well without immediate complications.     Assessment & Plan:   Problem List Items Addressed This Visit       Musculoskeletal and Integument   Arthritis of knee, degenerative - Primary    - Acute exacerbation of chronic knee pain right greater than left.  Patient reports a slip about a month ago with increasing pain. -Patient with end-stage arthritis, currently not a surgical candidate.  Patient is working diligently on weight loss. -Patient has tried corticosteroid injections in the past, greater than 6 months ago.  She is interested in steroid injection today for cessation of her pain.  Patient was counseled, and verbalized understanding.  She tolerated procedure well. -If patient does receive relief from this  injection, can consider zilretta      Rotator cuff syndrome    - Patient has had improved pain since last injection to the right shoulder on 12/11/2021, continue with home exercises and nonweightbearing activities.  We will follow-up as needed        Other   Obesity    - Patient continues with intermittent fasting and just at bedside, patient continues to have steady weight loss.  Encouraged physical activity such as swimming, nonweightbearing, patient verbalized understanding.       Return in about 4  weeks (around 02/12/2022), or sooner if symptoms worsen or fail to improve.    Elmore Guise, DO

## 2022-01-15 NOTE — Assessment & Plan Note (Signed)
-   Patient continues with intermittent fasting and just at bedside, patient continues to have steady weight loss.  Encouraged physical activity such as swimming, nonweightbearing, patient verbalized understanding.

## 2022-01-19 NOTE — Progress Notes (Deleted)
Cardiology Office Note:    Date:  01/19/2022   ID:  [name], DOB 1960/08/12, MRN [id number]  PCP:  Precious Gilding, Sarita Providers Cardiologist:  None {  Referring MD: McDiarmid, Blane Ohara, MD    History of Present Illness:    Valerie Carter is a 61 y.o. female with a hx of DMII, HTN, HLD and morbid obesity who was referred by by Dr. McDiarmid for further evaluation of palpitations.   Patient seen by Dr. Wendy Poet on 12/14/21. Note reviewed. Patient reported the sensation that her heart was skipping beats. ECG at PCP with PACs. She was started on metop '25mg'$  BID and referred to Cardiology for further evaluation.  Today, ***  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   Diabetes mellitus without complication ([place])    type 2   DJD (degenerative joint disease)    both knees right worse than left   Headache    Hyperlipidemia    Hypertension    Intramural leiomyoma of uterus    Joint pain    Knee pain    Lactose intolerance    Lower extremity edema    Morbid obesity with BMI of 50.0-59.9, adult ([place])    Osteoarthritis    Vitamin D deficiency     Past Surgical History:  Procedure Laterality Date   KNEE SURGERY Right 1999   torn cartlidge repair   LAPAROSCOPIC GASTRIC SLEEVE RESECTION N/A 06/07/2017   Procedure: LAPAROSCOPIC GASTRIC SLEEVE RESECTION, UPPER ENDOSCOPY;  Surgeon: Kieth Brightly, Arta Bruce, MD;  Location: [facility];  Service: General;  Laterality: N/A;    Current Medications: No outpatient medications have been marked as taking for the 01/21/22 encounter (Appointment) with Freada Bergeron, MD.     Allergies:   Patient has no known allergies.   Social History   Socioeconomic History   Marital status: Single    Spouse name: Not on file   Number of children: 1   Years of education: Not on file   Highest education level: Not on file   Occupational History   Occupation: school bus driver  Tobacco Use   Smoking status: Never   Smokeless tobacco: Never  Vaping Use   Vaping Use: Never used  Substance and Sexual Activity   Alcohol use: No    Alcohol/week: 0.0 standard drinks of alcohol   Drug use: No   Sexual activity: Never  Other Topics Concern   Not on file  Social History Narrative   Not on file   Social Determinants of Health   Financial Resource Strain: Not on file  Food Insecurity: Not on file  Transportation Needs: Not on file  Physical Activity: Not on file  Stress: Not on file  Social Connections: Not on file     Family History: The patient's ***family history includes Breast cancer (age of onset: 45) in her maternal aunt; Diabetes in her father, maternal aunt, and sister; Hypertension in her mother and sister.  ROS:   Please see the history of present illness.    *** All other systems reviewed and are negative.  EKGs/Labs/Other Studies Reviewed:    The following studies were reviewed today: ***  EKG:  EKG is *** ordered today.  The ekg ordered today demonstrates ***  Recent Labs: 12/14/2021: ALT 15; BUN 14; Creatinine, Ser 0.78; Hemoglobin 11.9;  Platelets 287; Potassium 4.0; Sodium 140  Recent Lipid Panel    Component Value Date/Time   CHOL 203 (H) 05/18/2021 1206   TRIG 49 05/18/2021 1206   HDL 73 05/18/2021 1206   CHOLHDL 2.8 05/18/2021 1206   CHOLHDL 3.5 12/29/2015 0908   VLDL 19 12/29/2015 0908   LDLCALC 121 (H) 05/18/2021 1206   LDLDIRECT 114 (H) 09/07/2011 1044     Risk Assessment/Calculations:   {Does this patient have ATRIAL FIBRILLATION?:[id number]}       Physical Exam:    VS:  There were no vitals taken for this visit.    Wt Readings from Last 3 Encounters:  01/15/22 (!) 305 lb (138.3 kg)  12/14/21 (!) 309 lb 3.2 oz (140.3 kg)  12/11/21 (!) 315 lb (142.9 kg)     GEN: *** Well nourished, well developed in no acute distress HEENT: Normal NECK: No JVD; No  carotid bruits LYMPHATICS: No lymphadenopathy CARDIAC: ***RRR, no murmurs, rubs, gallops RESPIRATORY:  Clear to auscultation without rales, wheezing or rhonchi  ABDOMEN: Soft, non-tender, non-distended MUSCULOSKELETAL:  No edema; No deformity  SKIN: Warm and dry NEUROLOGIC:  Alert and oriented x 3 PSYCHIATRIC:  Normal affect   ASSESSMENT:    No diagnosis found. PLAN:    In order of problems listed above:  #Palpitations: Patient with intermittent palpitations over the past month occurring ****.  -Check zio monitor -Continue metop '25mg'$  BID  #HTN: -Continue lisinopril '5mg'$  daily -Continue metop '25mg'$  BID  #Morbid Obesity: -On [name] -Continue lifestyle modifications      {Are you ordering a CV Procedure (e.g. stress test, cath, DCCV, TEE, etc)?   Press F2        :[id number]}    Medication Adjustments/Labs and Tests Ordered: Current medicines are reviewed at length with the patient today.  Concerns regarding medicines are outlined above.  No orders of the defined types were placed in this encounter.  No orders of the defined types were placed in this encounter.   There are no Patient Instructions on file for this visit.   Signed, Freada Bergeron, MD  01/19/2022 3:38 PM    [place]

## 2022-01-21 ENCOUNTER — Ambulatory Visit: Payer: BC Managed Care – PPO | Admitting: Cardiology

## 2022-01-22 ENCOUNTER — Other Ambulatory Visit: Payer: Self-pay | Admitting: Family Medicine

## 2022-01-22 DIAGNOSIS — M170 Bilateral primary osteoarthritis of knee: Secondary | ICD-10-CM

## 2022-01-22 MED ORDER — HYDROCODONE-ACETAMINOPHEN 10-325 MG PO TABS: ORAL_TABLET | ORAL | 0 refills | Status: DC

## 2022-01-29 ENCOUNTER — Encounter: Payer: Self-pay | Admitting: *Deleted

## 2022-02-01 DIAGNOSIS — I10 Essential (primary) hypertension: Secondary | ICD-10-CM | POA: Diagnosis not present

## 2022-02-01 DIAGNOSIS — E65 Localized adiposity: Secondary | ICD-10-CM | POA: Diagnosis not present

## 2022-02-01 DIAGNOSIS — E118 Type 2 diabetes mellitus with unspecified complications: Secondary | ICD-10-CM | POA: Diagnosis not present

## 2022-02-01 DIAGNOSIS — Z9189 Other specified personal risk factors, not elsewhere classified: Secondary | ICD-10-CM | POA: Diagnosis not present

## 2022-02-03 ENCOUNTER — Encounter (INDEPENDENT_AMBULATORY_CARE_PROVIDER_SITE_OTHER): Payer: Self-pay

## 2022-03-11 NOTE — Progress Notes (Deleted)
Cardiology Office Note:    Date:  03/11/2022   ID:  Valerie Carter, DOB June 14, 1961, MRN [id number]  PCP:  Precious Gilding, [place] Providers Cardiologist:  None {  Referring MD: McDiarmid, Blane Ohara, MD    History of Present Illness:    Valerie Carter is a 61 y.o. female with a hx of DMII, HLD, HTN, and morbid obesity who was referred by Dr. McDiarmid for further evaluation of palpitations  Was seen by Dr. McDiarmid on 12/14/21. Note reviewed. Patient felt like her heart was skipping beats. Given her symptoms, she was referred to Cardiology for further evaluation.  Today, ***  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   Diabetes mellitus without complication ([place])    type 2   DJD (degenerative joint disease)    both knees right worse than left   Headache    Hyperlipidemia    Hypertension    Intramural leiomyoma of uterus    Joint pain    Knee pain    Lactose intolerance    Lower extremity edema    Morbid obesity with BMI of 50.0-59.9, adult ([place])    Osteoarthritis    Vitamin D deficiency     Past Surgical History:  Procedure Laterality Date   KNEE SURGERY Right 1999   torn cartlidge repair   LAPAROSCOPIC GASTRIC SLEEVE RESECTION N/A 06/07/2017   Procedure: LAPAROSCOPIC GASTRIC SLEEVE RESECTION, UPPER ENDOSCOPY;  Surgeon: Kieth Brightly, Arta Bruce, MD;  Location: [facility];  Service: General;  Laterality: N/A;    Current Medications: No outpatient medications have been marked as taking for the 03/24/22 encounter (Appointment) with Freada Bergeron, MD.     Allergies:   Patient has no known allergies.   Social History   Socioeconomic History   Marital status: Single    Spouse name: Not on file   Number of children: 1   Years of education: Not on file   Highest education level: Not on file  Occupational History   Occupation: school bus driver   Tobacco Use   Smoking status: Never   Smokeless tobacco: Never  Vaping Use   Vaping Use: Never used  Substance and Sexual Activity   Alcohol use: No    Alcohol/week: 0.0 standard drinks of alcohol   Drug use: No   Sexual activity: Never  Other Topics Concern   Not on file  Social History Narrative   Not on file   Social Determinants of Health   Financial Resource Strain: Not on file  Food Insecurity: Not on file  Transportation Needs: Not on file  Physical Activity: Not on file  Stress: Not on file  Social Connections: Not on file     Family History: The patient's ***family history includes Breast cancer (age of onset: 66) in her maternal aunt; Diabetes in her father, maternal aunt, and sister; Hypertension in her mother and sister.  ROS:   Please see the history of present illness.    *** All other systems reviewed and are negative.  EKGs/Labs/Other Studies Reviewed:    The following studies were reviewed today: ***  EKG:  EKG is *** ordered today.  The ekg ordered today demonstrates ***  Recent Labs: 12/14/2021: ALT 15; BUN 14; Creatinine, Ser 0.78; Hemoglobin 11.9; Platelets 287; Potassium 4.0; Sodium 140  Recent Lipid Panel  Component Value Date/Time   CHOL 203 (H) 05/18/2021 1206   TRIG 49 05/18/2021 1206   HDL 73 05/18/2021 1206   CHOLHDL 2.8 05/18/2021 1206   CHOLHDL 3.5 12/29/2015 0908   VLDL 19 12/29/2015 0908   LDLCALC 121 (H) 05/18/2021 1206   LDLDIRECT 114 (H) 09/07/2011 1044     Risk Assessment/Calculations:   {Does this patient have ATRIAL FIBRILLATION?:[id number]}  No BP recorded.  {Refresh Note OR Click here to enter BP  :1}***         Physical Exam:    VS:  There were no vitals taken for this visit.    Wt Readings from Last 3 Encounters:  01/15/22 (!) 305 lb (138.3 kg)  12/14/21 (!) 309 lb 3.2 oz (140.3 kg)  12/11/21 (!) 315 lb (142.9 kg)     GEN: *** Well nourished, well developed in no acute distress HEENT: Normal NECK:  No JVD; No carotid bruits LYMPHATICS: No lymphadenopathy CARDIAC: ***RRR, no murmurs, rubs, gallops RESPIRATORY:  Clear to auscultation without rales, wheezing or rhonchi  ABDOMEN: Soft, non-tender, non-distended MUSCULOSKELETAL:  No edema; No deformity  SKIN: Warm and dry NEUROLOGIC:  Alert and oriented x 3 PSYCHIATRIC:  Normal affect   ASSESSMENT:    No diagnosis found. PLAN:    In order of problems listed above:  #Palpitations: -Check zio monitor -Increase hydrations -TSH normal  #HTN: -Continue lisinopril '5mg'$  daily -Continue metoprolol '25mg'$  BID  #DMII: -Continue [name]      {Are you ordering a CV Procedure (e.g. stress test, cath, DCCV, TEE, etc)?   Press F2        :[id number]}    Medication Adjustments/Labs and Tests Ordered: Current medicines are reviewed at length with the patient today.  Concerns regarding medicines are outlined above.  No orders of the defined types were placed in this encounter.  No orders of the defined types were placed in this encounter.   There are no Patient Instructions on file for this visit.   Signed, Freada Bergeron, MD  03/11/2022 8:21 PM    [place]

## 2022-03-24 ENCOUNTER — Ambulatory Visit: Payer: BC Managed Care – PPO | Admitting: Cardiology

## 2022-04-01 DIAGNOSIS — E118 Type 2 diabetes mellitus with unspecified complications: Secondary | ICD-10-CM | POA: Diagnosis not present

## 2022-04-01 DIAGNOSIS — I10 Essential (primary) hypertension: Secondary | ICD-10-CM | POA: Diagnosis not present

## 2022-04-01 DIAGNOSIS — K5900 Constipation, unspecified: Secondary | ICD-10-CM | POA: Diagnosis not present

## 2022-04-01 DIAGNOSIS — M1990 Unspecified osteoarthritis, unspecified site: Secondary | ICD-10-CM | POA: Diagnosis not present

## 2022-04-16 ENCOUNTER — Ambulatory Visit: Payer: Self-pay

## 2022-04-16 ENCOUNTER — Ambulatory Visit (INDEPENDENT_AMBULATORY_CARE_PROVIDER_SITE_OTHER): Payer: BC Managed Care – PPO | Admitting: Family Medicine

## 2022-04-16 ENCOUNTER — Encounter: Payer: Self-pay | Admitting: Family Medicine

## 2022-04-16 VITALS — BP 146/62 | Ht 64.5 in

## 2022-04-16 DIAGNOSIS — G8929 Other chronic pain: Secondary | ICD-10-CM

## 2022-04-16 DIAGNOSIS — M25512 Pain in left shoulder: Secondary | ICD-10-CM | POA: Diagnosis not present

## 2022-04-16 DIAGNOSIS — M25511 Pain in right shoulder: Secondary | ICD-10-CM | POA: Diagnosis not present

## 2022-04-16 DIAGNOSIS — M542 Cervicalgia: Secondary | ICD-10-CM | POA: Insufficient documentation

## 2022-04-16 MED ORDER — METHYLPREDNISOLONE ACETATE 40 MG/ML IJ SUSP
40.0000 mg | Freq: Once | INTRAMUSCULAR | Status: AC
Start: 2022-04-16 — End: 2022-04-16
  Administered 2022-04-16: 40 mg via INTRA_ARTICULAR

## 2022-04-16 MED ORDER — METHYLPREDNISOLONE ACETATE 40 MG/ML IJ SUSP
40.0000 mg | Freq: Once | INTRAMUSCULAR | Status: AC
  Administered 2022-04-16: 40 mg via INTRA_ARTICULAR

## 2022-04-18 NOTE — Progress Notes (Signed)
  Valerie Carter - 61 y.o. female MRN [id number]  Date of birth: [birth year]    SUBJECTIVE:      Chief Complaint:/ HPI:  Bilateral shoulder pain worsening.  Had injection in the right shoulder 3 to 4 months ago and it significantly helped.  Would like to have 1 in both shoulders this morning.  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  At least another couple years before she is able to retire.    OBJECTIVE: BP (!) 146/62   Ht 5' 4.5" (1.638 m)   BMI 51.54 kg/m   Physical Exam:  Vital signs are reviewed. GENERAL: Well-developed no acute distress SHOULDERS: Symmetrical.  Right shoulder abduction that is painless to 80 degrees but above that significant pain, intact strength.  Some stiffness above 120 degrees.  Forward flexion similar exam.  Left shoulder: Full abduction that is painless.  Forward flexion above 90 degrees painful. neuro: Intact sensation to soft touch, C5-6 and 7 muscular strength intact.  PROCEDURE: INJECTION: Patient was given informed consent, signed copy in the chart. Appropriate time out was taken. Area prepped and draped in usual sterile fashion. Ethyl chloride was  used for local anesthesia. A 21 gauge 1 1/2 inch needle was used.. 1 cc of methylprednisolone 40 mg/ml plus  4cc of 1% lidocaine without epinephrine was injected into the bilateral glenohumeral joints using a(n) ulrasound guided posterior approach.   The patient tolerated the procedure well. There were no complications. Post procedure instructions were given.   ASSESSMENT & PLAN:  B shoulder pain with prior improvement with GHJ injection (right) Today bilateral GHJ injections with corticosteroid using ultrasound giodance. F/u via my chart 2-3 weeks. See problem based charting & AVS for pt instructions. No problem-specific Assessment & Plan notes found for this encounter.

## 2022-05-13 DIAGNOSIS — I152 Hypertension secondary to endocrine disorders: Secondary | ICD-10-CM | POA: Diagnosis not present

## 2022-05-13 DIAGNOSIS — R0602 Shortness of breath: Secondary | ICD-10-CM | POA: Diagnosis not present

## 2022-05-13 DIAGNOSIS — E1169 Type 2 diabetes mellitus with other specified complication: Secondary | ICD-10-CM | POA: Diagnosis not present

## 2022-05-13 DIAGNOSIS — Z6841 Body Mass Index (BMI) 40.0 and over, adult: Secondary | ICD-10-CM | POA: Diagnosis not present

## 2022-05-23 NOTE — Progress Notes (Deleted)
Cardiology Office Note:    Date:  05/23/2022   ID:  Valerie Carter, DOB January 17, 1961, MRN [id number]  PCP:  Valerie Carter, [place] Providers Cardiologist:  None {    Referring MD: McDiarmid, Blane Ohara, MD    History of Present Illness:    Valerie Carter is a 61 y.o. female with a hx of HTN, DMII, HLD, obesity s/p gastric sleeve who was referred by Dr. McDiarmid for further evaluation of palpitations.  Patient seen by Dr. Wendy Carter on 12/14/21. Note reviewed. Patient felt like she was having skipping beats. Was having several episodes per day. Had associated SOB and diaphoresis. Given symptoms, she is now referred to Cardiology for further evaluation.  Today, ***  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   Diabetes mellitus without complication ([place])    type 2   DJD (degenerative joint disease)    both knees right worse than left   Headache    Hyperlipidemia    Hypertension    Intramural leiomyoma of uterus    Joint pain    Knee pain    Lactose intolerance    Lower extremity edema    Morbid obesity with BMI of 50.0-59.9, adult ([place])    Osteoarthritis    Vitamin D deficiency     Past Surgical History:  Procedure Laterality Date   KNEE SURGERY Right 1999   torn cartlidge repair   LAPAROSCOPIC GASTRIC SLEEVE RESECTION N/A 06/07/2017   Procedure: LAPAROSCOPIC GASTRIC SLEEVE RESECTION, UPPER ENDOSCOPY;  Surgeon: Valerie Brightly, Arta Bruce, MD;  Location: [facility];  Service: General;  Laterality: N/A;    Current Medications: No outpatient medications have been marked as taking for the 05/25/22 encounter (Appointment) with Valerie Bergeron, MD.     Allergies:   Patient has no known allergies.   Social History   Socioeconomic History   Marital status: Single    Spouse name: Not on file   Number of children: 1   Years of education: Not on file   Highest  education level: Not on file  Occupational History   Occupation: school bus driver  Tobacco Use   Smoking status: Never   Smokeless tobacco: Never  Vaping Use   Vaping Use: Never used  Substance and Sexual Activity   Alcohol use: No    Alcohol/week: 0.0 standard drinks of alcohol   Drug use: No   Sexual activity: Never  Other Topics Concern   Not on file  Social History Narrative   Not on file   Social Determinants of Health   Financial Resource Strain: Not on file  Food Insecurity: Not on file  Transportation Needs: Not on file  Physical Activity: Not on file  Stress: Not on file  Social Connections: Not on file     Family History: The patient's ***family history includes Breast cancer (age of onset: 57) in her maternal aunt; Diabetes in her father, maternal aunt, and sister; Hypertension in her mother and sister.  ROS:   Please see the history of present illness.    *** All other systems reviewed and are negative.  EKGs/Labs/Other Studies Reviewed:    The following studies were reviewed today: ***  EKG:  EKG is *** ordered today.  The ekg ordered today demonstrates ***  Recent Labs: 12/14/2021: ALT 15; BUN 14; Creatinine, Ser 0.78;  Hemoglobin 11.9; Platelets 287; Potassium 4.0; Sodium 140  Recent Lipid Panel    Component Value Date/Time   CHOL 203 (H) 05/18/2021 1206   TRIG 49 05/18/2021 1206   HDL 73 05/18/2021 1206   CHOLHDL 2.8 05/18/2021 1206   CHOLHDL 3.5 12/29/2015 0908   VLDL 19 12/29/2015 0908   LDLCALC 121 (H) 05/18/2021 1206   LDLDIRECT 114 (H) 09/07/2011 1044     Risk Assessment/Calculations:   {Does this patient have ATRIAL FIBRILLATION?:[id number]}  No BP recorded.  {Refresh Note OR Click here to enter BP  :1}***         Physical Exam:    VS:  There were no vitals taken for this visit.    Wt Readings from Last 3 Encounters:  01/15/22 (!) 305 lb (138.3 kg)  12/14/21 (!) 309 lb 3.2 oz (140.3 kg)  12/11/21 (!) 315 lb (142.9 kg)      GEN: *** Well nourished, well developed in no acute distress HEENT: Normal NECK: No JVD; No carotid bruits LYMPHATICS: No lymphadenopathy CARDIAC: ***RRR, no murmurs, rubs, gallops RESPIRATORY:  Clear to auscultation without rales, wheezing or rhonchi  ABDOMEN: Soft, non-tender, non-distended MUSCULOSKELETAL:  No edema; No deformity  SKIN: Warm and dry NEUROLOGIC:  Alert and oriented x 3 PSYCHIATRIC:  Normal affect   ASSESSMENT:    No diagnosis found. PLAN:    In order of problems listed above:  #Palpitations: Patient reports episodes of feeling like her heart is skipping beats with associated SOB and diaphoresis. No chest pain, lightheadedness or syncope. Will check zio monitor for further evaluation. -Check zio monitor -Continue metop '25mg'$  BID  #HTN: -Continue lisinopril '5mg'$  daily -Continue metop '25mg'$  BID  #DMII: -Managed by PCP  #Obesity: S/p gastric sleeve. Now on mounjaro      {Are you ordering a CV Procedure (e.g. stress test, cath, DCCV, TEE, etc)?   Press F2        :[id number]}    Medication Adjustments/Labs and Tests Ordered: Current medicines are reviewed at length with the patient today.  Concerns regarding medicines are outlined above.  No orders of the defined types were placed in this encounter.  No orders of the defined types were placed in this encounter.   There are no Patient Instructions on file for this visit.   Signed, Valerie Bergeron, MD  05/23/2022 7:48 PM    Valerie Carter

## 2022-05-25 ENCOUNTER — Ambulatory Visit: Payer: BC Managed Care – PPO | Admitting: Cardiology

## 2022-06-03 ENCOUNTER — Encounter: Payer: Self-pay | Admitting: Family Medicine

## 2022-06-03 ENCOUNTER — Telehealth: Payer: Self-pay

## 2022-06-03 NOTE — Telephone Encounter (Signed)
-----   Message from Carolyne Littles sent at 06/03/2022  9:27 AM EST ----- Regarding: letter request Pt is asking for an updated letter regarding her hydrocodone. States Dr. Nori Riis does this every year for her . Email to [email or web address]

## 2022-06-09 NOTE — Progress Notes (Unsigned)
Cardiology Office Note:    Date:  06/10/2022   ID:  Valerie Carter, DOB Aug 05, 1960, MRN [id number]  PCP:  Precious Gilding, [place] Providers Cardiologist:  None     Referring MD: McDiarmid, Blane Ohara, MD    History of Present Illness:    Valerie Carter is a 61 y.o. female with a hx of HTN with DM, BLD, Carpal Tunnel syndrome, Morbid Obesity s/p sleeve gastrectomy who presents for palpitations.  NO SHOW.  NO CHARGE.  CHART REVIEWED.      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   Diabetes mellitus without complication ([place])    type 2   DJD (degenerative joint disease)    both knees right worse than left   Headache    Hyperlipidemia    Hypertension    Intramural leiomyoma of uterus    Joint pain    Knee pain    Lactose intolerance    Lower extremity edema    Morbid obesity with BMI of 50.0-59.9, adult ([place])    Osteoarthritis    Vitamin D deficiency     Past Surgical History:  Procedure Laterality Date   KNEE SURGERY Right 1999   torn cartlidge repair   LAPAROSCOPIC GASTRIC SLEEVE RESECTION N/A 06/07/2017   Procedure: LAPAROSCOPIC GASTRIC SLEEVE RESECTION, UPPER ENDOSCOPY;  Surgeon: Kieth Brightly, Arta Bruce, MD;  Location: [facility];  Service: General;  Laterality: N/A;    Current Medications: No outpatient medications have been marked as taking for the 06/10/22 encounter (Office Visit) with Werner Lean, MD.     Allergies:   Patient has no known allergies.   Social History   Socioeconomic History   Marital status: Single    Spouse name: Not on file   Number of children: 1   Years of education: Not on file   Highest education level: Not on file  Occupational History   Occupation: school bus driver  Tobacco Use   Smoking status: Never   Smokeless tobacco: Never  Vaping Use   Vaping Use: Never used  Substance and Sexual Activity    Alcohol use: No    Alcohol/week: 0.0 standard drinks of alcohol   Drug use: No   Sexual activity: Never  Other Topics Concern   Not on file  Social History Narrative   Not on file   Social Determinants of Health   Financial Resource Strain: Not on file  Food Insecurity: Not on file  Transportation Needs: Not on file  Physical Activity: Not on file  Stress: Not on file  Social Connections: Not on file     Family History: The patient's family history includes Breast cancer (age of onset: 45) in her maternal aunt; Diabetes in her father, maternal aunt, and sister; Hypertension in her mother and sister.  ROS:   Please see the history of present illness.     EKGs/Labs/Other Studies Reviewed:       Recent Labs: 12/14/2021: ALT 15; BUN 14; Creatinine, Ser 0.78; Hemoglobin 11.9; Platelets 287; Potassium 4.0; Sodium 140  Recent Lipid Panel    Component Value Date/Time   CHOL 203 (H) 05/18/2021 1206   TRIG 49 05/18/2021 1206   HDL 73 05/18/2021 1206   CHOLHDL 2.8 05/18/2021 1206   CHOLHDL 3.5 12/29/2015 0908   VLDL 19 12/29/2015 0908   LDLCALC 121 (  H) 05/18/2021 1206   LDLDIRECT 114 (H) 09/07/2011 1044      Physical Exam:    VS:  There were no vitals taken for this visit.    Wt Readings from Last 3 Encounters:  01/15/22 (!) 305 lb (138.3 kg)  12/14/21 (!) 309 lb 3.2 oz (140.3 kg)  12/11/21 (!) 315 lb (142.9 kg)     NO SHOW.  NO CHARGE.  CHART REVIEWED.    ASSESSMENT:    1. Palpitations    PLAN:    NO SHOW.  NO CHARGE.  CHART REVIEWED.    Medication Adjustments/Labs and Tests Ordered: Current medicines are reviewed at length with the patient today.  Concerns regarding medicines are outlined above.  No orders of the defined types were placed in this encounter.  No orders of the defined types were placed in this encounter.   There are no Patient Instructions on file for this visit.   Signed, Werner Lean, MD  06/10/2022 11:28 AM    [place]

## 2022-06-10 ENCOUNTER — Ambulatory Visit: Payer: BC Managed Care – PPO | Attending: Cardiology | Admitting: Internal Medicine

## 2022-06-10 DIAGNOSIS — R002 Palpitations: Secondary | ICD-10-CM

## 2022-06-24 DIAGNOSIS — E118 Type 2 diabetes mellitus with unspecified complications: Secondary | ICD-10-CM | POA: Diagnosis not present

## 2022-06-24 DIAGNOSIS — M792 Neuralgia and neuritis, unspecified: Secondary | ICD-10-CM | POA: Diagnosis not present

## 2022-06-24 DIAGNOSIS — I152 Hypertension secondary to endocrine disorders: Secondary | ICD-10-CM | POA: Diagnosis not present

## 2022-06-24 DIAGNOSIS — R600 Localized edema: Secondary | ICD-10-CM | POA: Diagnosis not present

## 2022-09-07 DIAGNOSIS — M25511 Pain in right shoulder: Secondary | ICD-10-CM | POA: Diagnosis not present

## 2022-09-07 DIAGNOSIS — G8929 Other chronic pain: Secondary | ICD-10-CM | POA: Diagnosis not present

## 2022-09-07 DIAGNOSIS — E1169 Type 2 diabetes mellitus with other specified complication: Secondary | ICD-10-CM | POA: Diagnosis not present

## 2022-09-07 DIAGNOSIS — I10 Essential (primary) hypertension: Secondary | ICD-10-CM | POA: Diagnosis not present

## 2022-10-18 ENCOUNTER — Ambulatory Visit: Payer: BC Managed Care – PPO | Admitting: Student

## 2022-10-18 ENCOUNTER — Ambulatory Visit (INDEPENDENT_AMBULATORY_CARE_PROVIDER_SITE_OTHER): Payer: BC Managed Care – PPO | Admitting: Family Medicine

## 2022-10-18 VITALS — BP 137/62 | HR 83 | Ht 65.0 in | Wt 317.6 lb

## 2022-10-18 DIAGNOSIS — I10 Essential (primary) hypertension: Secondary | ICD-10-CM | POA: Diagnosis not present

## 2022-10-18 DIAGNOSIS — E785 Hyperlipidemia, unspecified: Secondary | ICD-10-CM

## 2022-10-18 DIAGNOSIS — G629 Polyneuropathy, unspecified: Secondary | ICD-10-CM

## 2022-10-18 DIAGNOSIS — E119 Type 2 diabetes mellitus without complications: Secondary | ICD-10-CM | POA: Diagnosis not present

## 2022-10-18 DIAGNOSIS — Z1231 Encounter for screening mammogram for malignant neoplasm of breast: Secondary | ICD-10-CM

## 2022-10-18 DIAGNOSIS — Z1211 Encounter for screening for malignant neoplasm of colon: Secondary | ICD-10-CM

## 2022-10-18 LAB — POCT GLYCOSYLATED HEMOGLOBIN (HGB A1C): HbA1c, POC (controlled diabetic range): 5.5 % (ref 0.0–7.0)

## 2022-10-18 MED ORDER — DULOXETINE HCL 30 MG PO CPEP: 30.0000 mg | ORAL_CAPSULE | Freq: Every day | ORAL | 1 refills | Status: DC

## 2022-10-18 NOTE — Patient Instructions (Addendum)
It was wonderful to see you today.  Please bring ALL of your medications with you to every visit.   Today we talked about:  Your A1c was 5.5. You should have this rechecked in about 6 months. We are doing lab work today to check your cholesterol, kidneys . I will send you a MyChart message if you have MyChart. Otherwise, I will give you a call for abnormal results or send a letter if everything returned back normal. If you don't hear from me in 2 weeks, please call the office.    I am sending a referral to the eye doctor, Gastroenterologist.   -You are overdue for breast cancer screening, I have ordered a mammogram.  You will need to call to schedule appointment at your earliest convenience. Their number is [phone] nerve related pain, I am starting you on a new medication called Cymbalta.   Thank you for coming to your visit as scheduled. We have had a large "no-show" problem lately, and this significantly limits our ability to see and care for patients. As a friendly reminder- if you cannot make your appointment please call to cancel. We do have a no show policy for those who do not cancel within 24 hours. Our policy is that if you miss or fail to cancel an appointment within 24 hours, 3 times in a 77-month period, you may be dismissed from our clinic.   Thank you for choosing [facility].   Please call [phone] with any questions about today's appointment.  Please be sure to schedule follow up at the front  desk before you leave today.   Sabino Dick, DO PGY-3 Family Medicine

## 2022-10-18 NOTE — Progress Notes (Signed)
SUBJECTIVE:   Chief compliant/HPI: annual examination  Valerie Carter is a 63 y.o. who presents today for an annual exam.   Works as a Surveyor, mining.  Does not do much for physical activity due to her work schedule.  Drinks 1 beer seldomly.  No other drug use.   History tabs reviewed and updated.   Review of systems form reviewed and notable for +nose bleeds, fatigue, difficulty sleeping.   OBJECTIVE:   BP 137/62   Pulse 83   Ht  (1.651 m)   Wt (!) 317 lb 9.6 oz (144.1 kg)   SpO2 100%   BMI 52.85 kg/m   General: Awake, alert, oriented, in no acute distress, pleasant and cooperative with examination HEENT: Normocephalic, atraumatic, nares patent, dentition is good, oropharynx without erythema or exudates, TM's clear bilaterally, no thyroid nodules palpated Cardio: RRR without murmur, 2+ radial, DP and PT pulses b/l Respiratory: CTAB without wheezing/rhonchi/rales Abdomen: Obese, soft, nttp  MSK: Able to move all extremities spontaneously, good muscle strength, no abnormalities Extremities: without edema but with pain to palpation of bilateral upper thighs Neuro: Speech is clear and intact, no focal deficits, no facial asymmetry, follows commands, normal gait  Psych: Normal mood and affect     10/18/2022   10:03 AM 06/02/2021   10:30 AM 08/19/2020    8:29 AM  Depression screen PHQ 2/9  Decreased Interest 0 1 3  Down, Depressed, Hopeless 0 1 1  PHQ - 2 Score 0 2 4  Altered sleeping Tired, decreased energy Change in appetite 2 0 1  Feeling bad or failure about yourself  0 0 3  Trouble concentrating 0 0 3  Moving slowly or fidgety/restless 0 0 0  Suicidal thoughts 0 0 0  PHQ-9 Score Difficult doing work/chores  Not difficult at all Very difficult   ASSESSMENT/PLAN:   1. Breast cancer screening by mammogram Overdue. Given information to call and schedule.  - MM 3D SCREENING MAMMOGRAM BILATERAL BREAST; Future  2. HYPERTENSION,  BENIGN ESSENTIAL At goal today. BMP UTD but will check for proteinuria.  - Microalbumin/Creatinine Ratio, Urine -Continue Lisinopril 5 mg   3. Type 2 diabetes mellitus without complication, unspecified whether long term insulin use Well controlled for the last 5 years and only on GLP-1.  - Microalbumin/Creatinine Ratio, Urine - Lipid Panel - HgB A1c - Ambulatory referral to Ophthalmology - Continue [name] 10 mg weekly; encouraged to take MiraLAX with this as it causes her some constipation  4. Hyperlipidemia LDL goal <70 Overdue, will check. Not on statin.  - Lipid Panel  5. Encounter for screening colonoscopy Overdue, has never had.  - Ambulatory referral to Gastroenterology  6. Neuropathy Pain to bilateral thighs with minimal touch. Also reports hx of neuropathy in the past. Unclear cause but safe to try in case discomfort is neuropathic. No signs of infection and b/l nature without edema makes DVT unlikely. No trauma.  - DULoxetine (CYMBALTA) 30 MG capsule; Take 1 capsule (30 mg total) by mouth daily.  Dispense: 60 capsule; Refill: 1  Annual Examination  See AVS for age appropriate recommendations  PHQ score 8, reviewed and discussed.  BP reviewed and at goal.  Asked about intimate partner violence and resources given as appropriate  Advance directives discussion and handout given  Considered the following items based upon USPSTF recommendations: Diabetes screening: discussed and ordered Screening for elevated cholesterol: discussed and ordered  HIV testing: discussed and declined Hepatitis C: discussed and declined Hepatitis B: discussed and declined Syphilis if at high risk: discussed and declined GC/CT not at high risk and not ordered. Osteoporosis screening considered based upon risk of fracture from [facility] calculator. Major osteoporotic fracture risk is 8.9%. DEXA not ordered.  Reviewed risk factors for latent tuberculosis and not indicated  Discussed family history,  BRCA testing not indicated. Tool used to risk stratify was Pedigree Assessment Tool. Maternal aunt passed at 61.   Cervical cancer screening:  Last Pap 01/2015, due Breast cancer screening:  due, ordered Colorectal cancer screening: discussed, colonoscopy ordered Lung cancer screening:  never smoker .  Vaccinations: offered shingrix, declines.   Follow up in 1  year or sooner if indicated.   Sabino Dick, DO [facility]

## 2022-10-20 ENCOUNTER — Other Ambulatory Visit: Payer: Self-pay | Admitting: Family Medicine

## 2022-10-20 DIAGNOSIS — E1169 Type 2 diabetes mellitus with other specified complication: Secondary | ICD-10-CM | POA: Diagnosis not present

## 2022-10-20 DIAGNOSIS — R600 Localized edema: Secondary | ICD-10-CM | POA: Diagnosis not present

## 2022-10-20 DIAGNOSIS — I10 Essential (primary) hypertension: Secondary | ICD-10-CM | POA: Diagnosis not present

## 2022-10-20 DIAGNOSIS — G8929 Other chronic pain: Secondary | ICD-10-CM | POA: Diagnosis not present

## 2022-10-20 LAB — LIPID PANEL
Chol/HDL Ratio: 2.5 ratio (ref 0.0–4.4)
Cholesterol, Total: 157 mg/dL (ref 100–199)
HDL: 64 mg/dL (ref >39)
LDL Chol Calc (NIH): 81 mg/dL (ref 0–99)
Triglycerides: 59 mg/dL (ref 0–149)
VLDL Cholesterol Cal: 12 mg/dL (ref 5–40)

## 2022-10-20 LAB — MICROALBUMIN / CREATININE URINE RATIO
Creatinine, Urine: 117.5 mg/dL
Microalb/Creat Ratio: 8 mg/g creat (ref 0–29)
Microalbumin, Urine: 9.3 ug/mL

## 2022-10-20 MED ORDER — ROSUVASTATIN CALCIUM 20 MG PO TABS: 20.0000 mg | ORAL_TABLET | Freq: Every day | ORAL | 3 refills | Status: DC

## 2022-10-26 ENCOUNTER — Ambulatory Visit: Payer: BC Managed Care – PPO

## 2022-11-02 ENCOUNTER — Encounter: Payer: Self-pay | Admitting: Gastroenterology

## 2022-11-03 ENCOUNTER — Telehealth: Payer: Self-pay

## 2022-11-03 NOTE — Telephone Encounter (Signed)
Dr. Tomasa Rand this pt BMI > 50 Please advise if you need and OV or are ok with direct to the hospital.

## 2022-11-03 NOTE — Telephone Encounter (Signed)
Attempted to call pt to make her aware due to BMI she is not a candidate to have her procedure here at [facility]. VM left for pt to call back to further discuss. Pts PV and colonoscopy have been cancelled

## 2022-11-04 NOTE — Telephone Encounter (Signed)
Pre visit see attached

## 2022-11-04 NOTE — Telephone Encounter (Signed)
Hey Dr. Tomasa Rand I called and spoke with the patient. She made me aware she had a cologuard in 2022. I confirmed and the results were negative at  that time. Pt would rather be put on the hospital list than repeat the stool test. Please advise?

## 2022-11-05 NOTE — Telephone Encounter (Signed)
Maya can you add this pt to the hospital list for Dr. Tomasa Rand please?

## 2022-11-26 ENCOUNTER — Other Ambulatory Visit: Payer: Self-pay

## 2022-11-26 ENCOUNTER — Encounter: Payer: Self-pay | Admitting: Family Medicine

## 2022-11-26 ENCOUNTER — Ambulatory Visit (INDEPENDENT_AMBULATORY_CARE_PROVIDER_SITE_OTHER): Payer: BC Managed Care – PPO | Admitting: Family Medicine

## 2022-11-26 VITALS — BP 152/83 | Ht 65.0 in | Wt 303.0 lb

## 2022-11-26 DIAGNOSIS — M170 Bilateral primary osteoarthritis of knee: Secondary | ICD-10-CM

## 2022-11-26 DIAGNOSIS — M25511 Pain in right shoulder: Secondary | ICD-10-CM | POA: Diagnosis not present

## 2022-11-26 DIAGNOSIS — G8929 Other chronic pain: Secondary | ICD-10-CM

## 2022-11-26 DIAGNOSIS — M25512 Pain in left shoulder: Secondary | ICD-10-CM | POA: Diagnosis not present

## 2022-11-26 DIAGNOSIS — Z6841 Body Mass Index (BMI) 40.0 and over, adult: Secondary | ICD-10-CM

## 2022-11-26 MED ORDER — METHYLPREDNISOLONE ACETATE 40 MG/ML IJ SUSP
40.0000 mg | Freq: Once | INTRAMUSCULAR | Status: AC
  Administered 2022-11-26: 40 mg via INTRA_ARTICULAR

## 2022-11-26 NOTE — Progress Notes (Signed)
  Valerie Carter - 62 y.o. female MRN [id number]  Date of birth: [birth year]    SUBJECTIVE:      Chief Complaint:/ HPI:   Bilateral shoulder pain. Wants to consider GHJ injections. Previously (last year) had one in left shoulder with siignificant improvement. Continnue with chronic B knee pain Continues with weight loss inplans to get TKR    OBJECTIVE: BP (!) 152/83   Ht 5\' 5"  (1.651 m)   Wt (!) 303 lb (137.4 kg)   BMI 50.42 kg/m   Physical Exam:  Vital signs are reviewed. PROCEDURE: INJECTION: Patient was given informed consent, signed copy in the chart. Appropriate time out was taken. Area prepped and draped in usual sterile fashion. Ethyl chloride was  used for local anesthesia. A 21 gauge 1 1/2 inch needle was used.. 1 cc of methylprednisolone 40 mg/ml plus  cc of 1% lidocaine without epinephrine was injected into the  Bilateral glenohumeral joints using a(n) US guided posterior approach.   The patient tolerated the procedure well. There were no complications. Post procedure instructions were given.   ASSESSMENT & PLAN:  See problem based charting & AVS for pt instructions. No problem-specific Assessment & Plan notes found for this encounter.

## 2022-11-29 ENCOUNTER — Encounter: Payer: Self-pay | Admitting: Family Medicine

## 2022-12-07 ENCOUNTER — Telehealth: Payer: Self-pay

## 2022-12-07 NOTE — Telephone Encounter (Signed)
Patient attempted to be outreached by Thyra Breed, PharmD Candidate on 12/07/22 to discuss hypertension. Left voicemail for patient to return our call at their convenience at [phone].  Thyra Breed, Student-PharmD

## 2022-12-07 NOTE — Progress Notes (Deleted)
    SUBJECTIVE:   CHIEF COMPLAINT / HPI:   ***  PERTINENT  PMH / PSH: ***  OBJECTIVE:   There were no vitals taken for this visit. ***  General: NAD, pleasant, able to participate in exam Cardiac: RRR, no murmurs. Respiratory: CTAB, normal effort, No wheezes, rales or rhonchi Abdomen: Bowel sounds present, nontender, nondistended, no hepatosplenomegaly. Extremities: no edema or cyanosis. Skin: warm and dry, no rashes noted Neuro: alert, no obvious focal deficits Psych: Normal affect and mood  ASSESSMENT/PLAN:   No problem-specific Assessment & Plan notes found for this encounter.     Dr. Janette Harvie, DO [facility]    {    This will disappear when note is signed, click to select method of visit    :1}  

## 2022-12-08 ENCOUNTER — Ambulatory Visit: Payer: Self-pay | Admitting: Student

## 2022-12-09 ENCOUNTER — Ambulatory Visit: Payer: BC Managed Care – PPO

## 2022-12-09 ENCOUNTER — Encounter: Payer: BC Managed Care – PPO | Admitting: Gastroenterology

## 2022-12-09 DIAGNOSIS — Z9189 Other specified personal risk factors, not elsewhere classified: Secondary | ICD-10-CM | POA: Diagnosis not present

## 2022-12-09 DIAGNOSIS — R609 Edema, unspecified: Secondary | ICD-10-CM | POA: Diagnosis not present

## 2022-12-09 DIAGNOSIS — E1169 Type 2 diabetes mellitus with other specified complication: Secondary | ICD-10-CM | POA: Diagnosis not present

## 2022-12-09 DIAGNOSIS — I152 Hypertension secondary to endocrine disorders: Secondary | ICD-10-CM | POA: Diagnosis not present

## 2022-12-09 DIAGNOSIS — M25562 Pain in left knee: Secondary | ICD-10-CM | POA: Diagnosis not present

## 2022-12-13 ENCOUNTER — Ambulatory Visit
Admission: RE | Admit: 2022-12-13 | Discharge: 2022-12-13 | Disposition: A | Discharge status: Home / Self Care | Payer: BC Managed Care – PPO | Source: Ambulatory Visit | Attending: Family Medicine | Admitting: Family Medicine

## 2022-12-13 DIAGNOSIS — Z1231 Encounter for screening mammogram for malignant neoplasm of breast: Secondary | ICD-10-CM | POA: Diagnosis not present

## 2022-12-15 ENCOUNTER — Other Ambulatory Visit: Payer: Self-pay | Admitting: Family Medicine

## 2022-12-15 DIAGNOSIS — R928 Other abnormal and inconclusive findings on diagnostic imaging of breast: Secondary | ICD-10-CM

## 2022-12-28 ENCOUNTER — Other Ambulatory Visit: Payer: Self-pay | Admitting: Family Medicine

## 2022-12-28 ENCOUNTER — Ambulatory Visit
Admission: RE | Admit: 2022-12-28 | Discharge: 2022-12-28 | Disposition: A | Discharge status: Home / Self Care | Payer: BC Managed Care – PPO | Source: Ambulatory Visit | Attending: Family Medicine | Admitting: Family Medicine

## 2022-12-28 DIAGNOSIS — N6323 Unspecified lump in the left breast, lower outer quadrant: Secondary | ICD-10-CM | POA: Diagnosis not present

## 2022-12-28 DIAGNOSIS — R928 Other abnormal and inconclusive findings on diagnostic imaging of breast: Secondary | ICD-10-CM

## 2022-12-28 DIAGNOSIS — N6320 Unspecified lump in the left breast, unspecified quadrant: Secondary | ICD-10-CM

## 2023-01-04 ENCOUNTER — Ambulatory Visit
Admission: RE | Admit: 2023-01-04 | Discharge: 2023-01-04 | Disposition: A | Discharge status: Home / Self Care | Payer: BC Managed Care – PPO | Source: Ambulatory Visit | Attending: Family Medicine | Admitting: Family Medicine

## 2023-01-04 DIAGNOSIS — N6320 Unspecified lump in the left breast, unspecified quadrant: Secondary | ICD-10-CM

## 2023-01-04 DIAGNOSIS — N6012 Diffuse cystic mastopathy of left breast: Secondary | ICD-10-CM | POA: Diagnosis not present

## 2023-01-04 DIAGNOSIS — N6323 Unspecified lump in the left breast, lower outer quadrant: Secondary | ICD-10-CM | POA: Diagnosis not present

## 2023-01-04 HISTORY — PX: BREAST BIOPSY: SHX20

## 2023-01-11 ENCOUNTER — Encounter (HOSPITAL_COMMUNITY): Payer: Self-pay | Admitting: *Deleted

## 2023-01-20 ENCOUNTER — Telehealth: Payer: Self-pay

## 2023-01-20 NOTE — Telephone Encounter (Signed)
Left VM for patient to return call to schedule colonoscopy at the hospital.

## 2023-03-16 ENCOUNTER — Other Ambulatory Visit: Payer: Self-pay

## 2023-03-16 ENCOUNTER — Observation Stay (HOSPITAL_COMMUNITY)
Admission: EM | Admit: 2023-03-16 | Discharge: 2023-03-18 | Disposition: A | Discharge status: Home / Self Care | Payer: BC Managed Care – PPO | Attending: Emergency Medicine | Admitting: Emergency Medicine

## 2023-03-16 DIAGNOSIS — D500 Iron deficiency anemia secondary to blood loss (chronic): Principal | ICD-10-CM | POA: Diagnosis present

## 2023-03-16 DIAGNOSIS — Z6841 Body Mass Index (BMI) 40.0 and over, adult: Secondary | ICD-10-CM | POA: Insufficient documentation

## 2023-03-16 DIAGNOSIS — Z794 Long term (current) use of insulin: Secondary | ICD-10-CM | POA: Insufficient documentation

## 2023-03-16 DIAGNOSIS — Z79899 Other long term (current) drug therapy: Secondary | ICD-10-CM | POA: Diagnosis not present

## 2023-03-16 DIAGNOSIS — D62 Acute posthemorrhagic anemia: Secondary | ICD-10-CM | POA: Diagnosis present

## 2023-03-16 DIAGNOSIS — R5382 Chronic fatigue, unspecified: Secondary | ICD-10-CM | POA: Diagnosis not present

## 2023-03-16 DIAGNOSIS — Z13220 Encounter for screening for lipoid disorders: Secondary | ICD-10-CM | POA: Diagnosis not present

## 2023-03-16 DIAGNOSIS — D649 Anemia, unspecified: Principal | ICD-10-CM

## 2023-03-16 DIAGNOSIS — K9189 Other postprocedural complications and disorders of digestive system: Secondary | ICD-10-CM

## 2023-03-16 DIAGNOSIS — Z91199 Patient's noncompliance with other medical treatment and regimen due to unspecified reason: Secondary | ICD-10-CM | POA: Insufficient documentation

## 2023-03-16 DIAGNOSIS — K922 Gastrointestinal hemorrhage, unspecified: Secondary | ICD-10-CM | POA: Diagnosis not present

## 2023-03-16 DIAGNOSIS — E1169 Type 2 diabetes mellitus with other specified complication: Secondary | ICD-10-CM | POA: Diagnosis not present

## 2023-03-16 DIAGNOSIS — I10 Essential (primary) hypertension: Secondary | ICD-10-CM | POA: Diagnosis not present

## 2023-03-16 DIAGNOSIS — R002 Palpitations: Secondary | ICD-10-CM

## 2023-03-16 DIAGNOSIS — E669 Obesity, unspecified: Secondary | ICD-10-CM | POA: Diagnosis present

## 2023-03-16 DIAGNOSIS — E1140 Type 2 diabetes mellitus with diabetic neuropathy, unspecified: Secondary | ICD-10-CM | POA: Diagnosis not present

## 2023-03-16 DIAGNOSIS — M47816 Spondylosis without myelopathy or radiculopathy, lumbar region: Secondary | ICD-10-CM | POA: Diagnosis not present

## 2023-03-16 DIAGNOSIS — Z9884 Bariatric surgery status: Secondary | ICD-10-CM

## 2023-03-16 DIAGNOSIS — M439 Deforming dorsopathy, unspecified: Secondary | ICD-10-CM | POA: Diagnosis not present

## 2023-03-16 DIAGNOSIS — M5442 Lumbago with sciatica, left side: Secondary | ICD-10-CM | POA: Diagnosis not present

## 2023-03-16 DIAGNOSIS — E785 Hyperlipidemia, unspecified: Secondary | ICD-10-CM | POA: Diagnosis present

## 2023-03-16 DIAGNOSIS — E119 Type 2 diabetes mellitus without complications: Secondary | ICD-10-CM

## 2023-03-16 DIAGNOSIS — G8929 Other chronic pain: Secondary | ICD-10-CM | POA: Diagnosis not present

## 2023-03-16 LAB — COMPREHENSIVE METABOLIC PANEL WITH GFR
ALT: 14 U/L (ref 0–44)
AST: 19 U/L (ref 15–41)
Albumin: 3.4 g/dL — ABNORMAL LOW (ref 3.5–5.0)
Alkaline Phosphatase: 67 U/L (ref 38–126)
Anion gap: 9 (ref 5–15)
BUN: 17 mg/dL (ref 8–23)
CO2: 21 mmol/L — ABNORMAL LOW (ref 22–32)
Calcium: 8.8 mg/dL — ABNORMAL LOW (ref 8.9–10.3)
Chloride: 105 mmol/L (ref 98–111)
Creatinine, Ser: 0.76 mg/dL (ref 0.44–1.00)
GFR, Estimated: >60 mL/min (ref >60)
Glucose, Bld: 88 mg/dL (ref 70–99)
Potassium: 3.9 mmol/L (ref 3.5–5.1)
Sodium: 135 mmol/L (ref 135–145)
Total Bilirubin: 1.2 mg/dL (ref 0.3–1.2)
Total Protein: 8.4 g/dL — ABNORMAL HIGH (ref 6.5–8.1)

## 2023-03-16 LAB — CBC
HCT: 25.4 % — ABNORMAL LOW (ref 36.0–46.0)
Hemoglobin: 6.3 g/dL — CL (ref 12.0–15.0)
MCH: 15.6 pg — ABNORMAL LOW (ref 26.0–34.0)
MCHC: 24.8 g/dL — ABNORMAL LOW (ref 30.0–36.0)
MCV: 63 fL — ABNORMAL LOW (ref 80.0–100.0)
Platelets: 285 10*3/uL (ref 150–400)
RBC: 4.03 MIL/uL (ref 3.87–5.11)
RDW: 19.3 % — ABNORMAL HIGH (ref 11.5–15.5)
WBC: 7 10*3/uL (ref 4.0–10.5)
nRBC: 0 % (ref 0.0–0.2)

## 2023-03-16 LAB — TYPE AND SCREEN
ABO/RH(D): AB NEG
Antibody Screen: NEGATIVE

## 2023-03-16 NOTE — ED Provider Triage Note (Signed)
Emergency Medicine Provider Triage Evaluation Note  Valerie Carter , a 62 y.o. female  was evaluated in triage.  Pt was at an appointment earlier and told she had a low hemoglobin in the sixes.  Was told to come to the ER.  Denies any black stools, blood in her stools, vaginal bleeding.  Denies any shortness of breath or dizziness.  States sometimes she is more winded when walking to and from the car.  Says she refuses blood products due to religious reasons.  Review of Systems  Positive: As above Negative: As above  Physical Exam  BP (!) 192/106 (BP Location: Left Arm)   Pulse (!) 113   Temp 98.8 F (37.1 C) (Oral)   Resp (!) 21   Ht 5\' 5"  (1.651 m)   Wt (!) 140.2 kg   SpO2 95%   BMI 51.42 kg/m  Gen:   Awake, no distress   Resp:  Normal effort  MSK:   Moves extremities without difficulty    Medical Decision Making  Medically screening exam initiated at 8:07 PM.  Appropriate orders placed.  Valerie Carter was informed that the remainder of the evaluation will be completed by another provider, this initial triage assessment does not replace that evaluation, and the importance of remaining in the ED until their evaluation is complete.     Valerie Merles, PA-C 03/16/23 2008

## 2023-03-16 NOTE — ED Triage Notes (Signed)
Pt was called by PCP due to abnormal labs. Pt reports she was told her hgb was 6. Sts she refuses blood d/t religous reasons. No dizziness/lightheaded. Endorses palpitations. No heavy periods. No dark stools. No blood thinner.

## 2023-03-17 ENCOUNTER — Other Ambulatory Visit: Payer: Self-pay | Admitting: Hematology and Oncology

## 2023-03-17 ENCOUNTER — Encounter (HOSPITAL_COMMUNITY): Payer: Self-pay | Admitting: Internal Medicine

## 2023-03-17 DIAGNOSIS — K922 Gastrointestinal hemorrhage, unspecified: Secondary | ICD-10-CM | POA: Diagnosis not present

## 2023-03-17 DIAGNOSIS — D62 Acute posthemorrhagic anemia: Secondary | ICD-10-CM | POA: Diagnosis present

## 2023-03-17 DIAGNOSIS — Z794 Long term (current) use of insulin: Secondary | ICD-10-CM | POA: Diagnosis not present

## 2023-03-17 DIAGNOSIS — D500 Iron deficiency anemia secondary to blood loss (chronic): Secondary | ICD-10-CM | POA: Diagnosis not present

## 2023-03-17 DIAGNOSIS — D649 Anemia, unspecified: Secondary | ICD-10-CM | POA: Diagnosis not present

## 2023-03-17 DIAGNOSIS — Z79899 Other long term (current) drug therapy: Secondary | ICD-10-CM | POA: Diagnosis not present

## 2023-03-17 DIAGNOSIS — D509 Iron deficiency anemia, unspecified: Secondary | ICD-10-CM | POA: Diagnosis not present

## 2023-03-17 DIAGNOSIS — K9189 Other postprocedural complications and disorders of digestive system: Secondary | ICD-10-CM

## 2023-03-17 DIAGNOSIS — R195 Other fecal abnormalities: Secondary | ICD-10-CM | POA: Diagnosis not present

## 2023-03-17 DIAGNOSIS — E1169 Type 2 diabetes mellitus with other specified complication: Secondary | ICD-10-CM | POA: Diagnosis not present

## 2023-03-17 DIAGNOSIS — R5383 Other fatigue: Secondary | ICD-10-CM | POA: Diagnosis not present

## 2023-03-17 DIAGNOSIS — I10 Essential (primary) hypertension: Secondary | ICD-10-CM | POA: Diagnosis not present

## 2023-03-17 DIAGNOSIS — Z91199 Patient's noncompliance with other medical treatment and regimen due to unspecified reason: Secondary | ICD-10-CM | POA: Diagnosis not present

## 2023-03-17 DIAGNOSIS — Z6841 Body Mass Index (BMI) 40.0 and over, adult: Secondary | ICD-10-CM | POA: Diagnosis not present

## 2023-03-17 LAB — COMPREHENSIVE METABOLIC PANEL
ALT: 12 U/L (ref 0–44)
AST: 16 U/L (ref 15–41)
Albumin: 3.1 g/dL — ABNORMAL LOW (ref 3.5–5.0)
Alkaline Phosphatase: 60 U/L (ref 38–126)
Anion gap: 6 (ref 5–15)
BUN: 14 mg/dL (ref 8–23)
CO2: 21 mmol/L — ABNORMAL LOW (ref 22–32)
Calcium: 8.5 mg/dL — ABNORMAL LOW (ref 8.9–10.3)
Chloride: 110 mmol/L (ref 98–111)
Creatinine, Ser: 0.43 mg/dL — ABNORMAL LOW (ref 0.44–1.00)
GFR, Estimated: >60 mL/min (ref >60)
Glucose, Bld: 92 mg/dL (ref 70–99)
Potassium: 3.6 mmol/L (ref 3.5–5.1)
Sodium: 137 mmol/L (ref 135–145)
Total Bilirubin: 1.1 mg/dL (ref 0.3–1.2)
Total Protein: 7.6 g/dL (ref 6.5–8.1)

## 2023-03-17 LAB — CBC WITH DIFFERENTIAL/PLATELET
Abs Immature Granulocytes: 0.02 10*3/uL (ref 0.00–0.07)
Basophils Absolute: 0 10*3/uL (ref 0.0–0.1)
Basophils Relative: 1 %
Eosinophils Absolute: 0.3 10*3/uL (ref 0.0–0.5)
Eosinophils Relative: 4 %
HCT: 22.5 % — ABNORMAL LOW (ref 36.0–46.0)
Hemoglobin: 5.6 g/dL — CL (ref 12.0–15.0)
Immature Granulocytes: 0 %
Lymphocytes Relative: 32 %
Lymphs Abs: 2.3 10*3/uL (ref 0.7–4.0)
MCH: 15.7 pg — ABNORMAL LOW (ref 26.0–34.0)
MCHC: 24.9 g/dL — ABNORMAL LOW (ref 30.0–36.0)
MCV: 63 fL — ABNORMAL LOW (ref 80.0–100.0)
Monocytes Absolute: 0.9 10*3/uL (ref 0.1–1.0)
Monocytes Relative: 12 %
Neutro Abs: 3.8 10*3/uL (ref 1.7–7.7)
Neutrophils Relative %: 51 %
Platelets: 259 10*3/uL (ref 150–400)
RBC: 3.57 MIL/uL — ABNORMAL LOW (ref 3.87–5.11)
RDW: 19.1 % — ABNORMAL HIGH (ref 11.5–15.5)
WBC: 7.4 10*3/uL (ref 4.0–10.5)
nRBC: 0 % (ref 0.0–0.2)

## 2023-03-17 LAB — RETICULOCYTES
Immature Retic Fract: 20.8 % — ABNORMAL HIGH (ref 2.3–15.9)
RBC.: 3.73 MIL/uL — ABNORMAL LOW (ref 3.87–5.11)
Retic Count, Absolute: 72.4 10*3/uL (ref 19.0–186.0)
Retic Ct Pct: 1.9 % (ref 0.4–3.1)

## 2023-03-17 LAB — HEMOGLOBIN A1C
Hgb A1c MFr Bld: 5.5 % (ref 4.8–5.6)
Mean Plasma Glucose: 111.15 mg/dL

## 2023-03-17 LAB — POC OCCULT BLOOD, ED
Fecal Occult Bld: NEGATIVE
Fecal Occult Bld: POSITIVE — AB

## 2023-03-17 LAB — IRON AND TIBC
Iron: 12 ug/dL — ABNORMAL LOW (ref 28–170)
Saturation Ratios: 3 % — ABNORMAL LOW (ref 10.4–31.8)
TIBC: 382 ug/dL (ref 250–450)
UIBC: 370 ug/dL

## 2023-03-17 LAB — FERRITIN: Ferritin: 1 ng/mL — ABNORMAL LOW (ref 11–307)

## 2023-03-17 LAB — VITAMIN B12: Vitamin B-12: 1654 pg/mL — ABNORMAL HIGH (ref 180–914)

## 2023-03-17 LAB — GLUCOSE, CAPILLARY
Glucose-Capillary: 76 mg/dL (ref 70–99)
Glucose-Capillary: 107 mg/dL — ABNORMAL HIGH (ref 70–99)
Glucose-Capillary: 100 mg/dL — ABNORMAL HIGH (ref 70–99)

## 2023-03-17 LAB — MAGNESIUM: Magnesium: 1.8 mg/dL (ref 1.7–2.4)

## 2023-03-17 LAB — PROTIME-INR
INR: 1.2 (ref 0.8–1.2)
Prothrombin Time: 15.1 seconds (ref 11.4–15.2)

## 2023-03-17 LAB — FOLATE: Folate: 9.4 ng/mL (ref >5.9)

## 2023-03-17 MED ORDER — LORAZEPAM 1 MG PO TABS: 1.0000 mg | ORAL_TABLET | ORAL | Status: DC | PRN

## 2023-03-17 MED ORDER — SODIUM CHLORIDE 0.9 % IV SOLN
100.0000 mg | Freq: Once | INTRAVENOUS | Status: AC
  Administered 2023-03-17: 100 mg via INTRAVENOUS
  Filled 2023-03-17: qty 5

## 2023-03-17 MED ORDER — ONDANSETRON HCL 4 MG/2ML IJ SOLN: 4.0000 mg | Freq: Four times a day (QID) | INTRAMUSCULAR | Status: DC | PRN

## 2023-03-17 MED ORDER — ACETAMINOPHEN 650 MG RE SUPP: 650.0000 mg | Freq: Four times a day (QID) | RECTAL | Status: DC | PRN

## 2023-03-17 MED ORDER — FOLIC ACID 1 MG PO TABS
1.0000 mg | ORAL_TABLET | Freq: Every day | ORAL | Status: DC
  Administered 2023-03-17 – 2023-03-18 (×2): 1 mg via ORAL
  Filled 2023-03-17 (×2): qty 1

## 2023-03-17 MED ORDER — LORAZEPAM 0.5 MG PO TABS: 0.5000 mg | ORAL_TABLET | Freq: Four times a day (QID) | ORAL | Status: DC | PRN

## 2023-03-17 MED ORDER — SODIUM CHLORIDE 0.9 % IV BOLUS
1000.0000 mL | Freq: Once | INTRAVENOUS | Status: AC
  Administered 2023-03-17: 1000 mL via INTRAVENOUS

## 2023-03-17 MED ORDER — IRON SUCROSE 500 MG IVPB - SIMPLE MED: 500.0000 mg | INTRAVENOUS | Status: DC

## 2023-03-17 MED ORDER — THIAMINE HCL 100 MG/ML IJ SOLN: 100.0000 mg | Freq: Every day | INTRAMUSCULAR | Status: DC

## 2023-03-17 MED ORDER — SODIUM CHLORIDE 0.9% FLUSH
3.0000 mL | Freq: Two times a day (BID) | INTRAVENOUS | Status: DC
  Administered 2023-03-17 – 2023-03-18 (×3): 3 mL via INTRAVENOUS

## 2023-03-17 MED ORDER — THIAMINE MONONITRATE 100 MG PO TABS
100.0000 mg | ORAL_TABLET | Freq: Every day | ORAL | Status: DC
  Administered 2023-03-17 – 2023-03-18 (×2): 100 mg via ORAL
  Filled 2023-03-17 (×2): qty 1

## 2023-03-17 MED ORDER — INSULIN ASPART 100 UNIT/ML IJ SOLN
0.0000 [IU] | Freq: Three times a day (TID) | INTRAMUSCULAR | Status: DC
  Filled 2023-03-17: qty 0.09

## 2023-03-17 MED ORDER — PANTOPRAZOLE SODIUM 40 MG PO TBEC
40.0000 mg | DELAYED_RELEASE_TABLET | Freq: Every day | ORAL | Status: DC
  Administered 2023-03-17 – 2023-03-18 (×2): 40 mg via ORAL
  Filled 2023-03-17 (×2): qty 1

## 2023-03-17 MED ORDER — POLYETHYLENE GLYCOL 3350 17 G PO PACK: 17.0000 g | PACK | Freq: Every day | ORAL | Status: DC | PRN

## 2023-03-17 MED ORDER — ADULT MULTIVITAMIN W/MINERALS CH
1.0000 | ORAL_TABLET | Freq: Every day | ORAL | Status: DC
  Administered 2023-03-17 – 2023-03-18 (×2): 1 via ORAL
  Filled 2023-03-17 (×2): qty 1

## 2023-03-17 MED ORDER — SENNA 8.6 MG PO TABS
1.0000 | ORAL_TABLET | Freq: Two times a day (BID) | ORAL | Status: DC
  Administered 2023-03-17 – 2023-03-18 (×3): 8.6 mg via ORAL
  Filled 2023-03-17 (×3): qty 1

## 2023-03-17 MED ORDER — ACETAMINOPHEN 325 MG PO TABS: 650.0000 mg | ORAL_TABLET | Freq: Four times a day (QID) | ORAL | Status: DC | PRN

## 2023-03-17 NOTE — H&P (Addendum)
History and Physical    Valerie Carter KKX:[id number] DOB: 1960/09/13 DOA: 03/16/2023  PCP: Erick Alley, DO   I have briefly reviewed patients previous medical reports in [facility].  Patient coming from: Home  Chief Complaint: Low hemoglobin.  Exertional dyspnea and palpitations.  HPI: Valerie Carter is a 62 year old married female, Jehovah's Witness (does not take blood transfusions), works as a Designer, industrial/product, independent, menopausal, medical history significant for s/p laparoscopic gastric sleeve resection 2018 (lost to follow-up), noncompliant with recommended multivitamins thereafter, type II DM, HTN, HLD, morbid obesity, presented to the ED on 03/17/2023 after being directed by her PCP due to low blood counts.  She states that she established care with new PCP and had her first visit yesterday when screening labs were drawn and was later advised to come to the ED due to hemoglobin in the 6 g range.  She reports approximately 2 months history of progressive weakness, dyspnea and palpitations with minimal exertion such as even going from her house to her car that is parked outside.  She would have to sit down for a few minutes in the car prior to recovering.  She denies history of chest pain, dizziness, lightheadedness or passing out.  She reports no.  Since her gastric sleeve surgery.  However since starting [name] approximately 2 years ago, has noted spotting for a day or 2 after she takes the [place] shot.  No heavy menstrual bleeding.  She also reports intermittent low-volume bright red bleeding per rectum only after constipation.  Reportedly gets constipated about 3-4 times per month and uses MiraLAX with water regularly.  Denies dyspepsia, abdominal pain, nausea or vomiting.  Appetite is overall decreased since initiating [name].  Denies pica.  Reports intermittent nosebleeds which she correlates to blood pressures being low after taking antihypertensives.  This seems  like low volume as well.  She had it every other day for about 2 weeks, approximately 2 weeks ago but none since.  States that she had a Cologuard done approximately a year ago which was negative but has never had a EGD or colonoscopy.  Patient is not on antiplatelets or anticoagulants.  Not compliant with most of her meds.  ED Course: Initially hypertensive to 192/106 and blood pressures have since normalized.  Other vital signs stable.  Lab work significant for hemoglobin on arrival 6.3, now 5.6.  MCV 63.  Anemia panel significant for iron 12, saturation ratio 3, ferritin 1.  Folate 9.4, B12: 1654.  CMP unremarkable.  As per EDP DRE, normal colored stools, initial FOBT appeared to be negative but subsequent 1 was positive.  Review of Systems:  All other systems reviewed and apart from HPI, are negative.  Reports 3 days history of dull mild left flank pain.  No urinary symptoms or fever.  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   Diabetes mellitus without complication (HCC)    type 2   DJD (degenerative joint disease)    both knees right worse than left   Headache    Hyperlipidemia    Hypertension    Intramural leiomyoma of uterus    Joint pain    Knee pain    Lactose intolerance    Lower extremity edema    Morbid obesity with BMI of 50.0-59.9, adult (HCC)    Osteoarthritis    Vitamin D  deficiency     Past Surgical History:  Procedure Laterality Date   BREAST BIOPSY Left 01/04/2023   [place] LT BREAST BX W LOC DEV 1ST LESION IMG BX SPEC US GUIDE 01/04/2023 [facility]   KNEE SURGERY Right 1999   torn cartlidge repair   LAPAROSCOPIC GASTRIC SLEEVE RESECTION N/A 06/07/2017   Procedure: LAPAROSCOPIC GASTRIC SLEEVE RESECTION, UPPER ENDOSCOPY;  Surgeon: Sheliah Hatch, De Blanch, MD;  Location: [facility];  Service: General;  Laterality: N/A;    Social History  reports that she has never smoked. She has  never used smokeless tobacco. She reports current alcohol use of about 14.0 standard drinks of alcohol per week. She reports that she does not use drugs.  No Known Allergies  Family History  Problem Relation Age of Onset   Diabetes Sister    Hypertension Sister    Diabetes Maternal Aunt    Breast cancer Maternal Aunt 29   Hypertension Mother    Diabetes Father      Prior to Admission medications   Medication Sig Start Date End Date Taking? Authorizing Provider  Acetaminophen (TYLENOL ARTHRITIS PAIN PO) Take 1 tablet by mouth every 12 (twelve) hours.   Yes [provider]  Ibuprofen (MOTRIN IB PO) Take 2 tablets by mouth every 6 (six) hours as needed (Pain).   Yes [provider]  lisinopril (ZESTRIL) 5 MG tablet TAKE 1 TABLET BY MOUTH AT BEDTIME 08/26/21  Yes Shirlean Mylar, MD  rosuvastatin (CRESTOR) 20 MG tablet Take 1 tablet (20 mg total) by mouth daily. Patient not taking: Reported on 03/17/2023 10/20/22   Sabino Dick, DO  tirzepatide [facility]) 10 MG/0.5ML Pen Inject 10 mg into the skin once a week. 09/22/21  Yes Helane Rima, DO  Blood Pressure KIT Check your blood pressure 2-3 times a week 06/02/21   Dana Allan, MD  cyclobenzaprine (FLEXERIL) 10 MG tablet Take 1 tablet (10 mg total) by mouth 3 (three) times daily as needed for muscle spasms. Patient not taking: Reported on 10/18/2022 06/25/21   Dione Booze, MD  DULoxetine (CYMBALTA) 30 MG capsule Take 1 capsule (30 mg total) by mouth daily. Patient not taking: Reported on 03/17/2023 10/18/22   Sabino Dick, DO  gabapentin (NEURONTIN) 100 MG capsule Take 1 capsule (100 mg total) by mouth at bedtime. Patient not taking: Reported on 10/18/2022 09/18/21   Nestor Ramp, MD  HYDROcodone-acetaminophen [facility]) 10-325 MG tablet Take one tablet up to 4 times a day as directed for knee pain Patient not taking: Reported on 10/18/2022 01/22/22   Nestor Ramp, MD  metoprolol tartrate (LOPRESSOR) 25 MG tablet Take 1  tablet by mouth twice daily Patient not taking: Reported on 03/17/2023 01/06/22   Erick Alley, DO  Vitamin D, Ergocalciferol, (DRISDOL) 1.25 MG (50000 UNIT) CAPS capsule Take 1 capsule (50,000 Units total) by mouth every 7 (seven) days. Patient not taking: Reported on 10/18/2022 08/20/21   Helane Rima, DO    Physical Exam: Vitals:   03/17/23 0400 03/17/23 0600 03/17/23 0727 03/17/23 0730  BP: 134/87 (!) 114/56  (!) 129/52  Pulse: (!) 105 (!) 110  92  Resp: 16 (!) 23  16  Temp:   98 F (36.7 C)   TempSrc:   Oral   SpO2: 98% 99%  100%  Weight:      Height:          Constitutional: Pleasant middle-age female, moderately built and obese lying comfortably propped up in bed without distress. Eyes: PERTLA, lids and conjunctivae  normal ENMT: Mucous membranes are moist. Posterior pharynx clear of any exudate or lesions. Normal dentition.  Neck: supple, no masses, no thyromegaly Respiratory: Clear to auscultation without wheezing, rhonchi or crackles. No increased work of breathing. Cardiovascular: S1 & S2 heard, regular rate and rhythm. No JVD, murmurs, rubs or clicks. No pedal edema.  Telemetry personally reviewed at bedside: Sinus rhythm. Abdomen: Non distended. Non tender. Soft. No organomegaly or masses appreciated. No clinical Ascites. Normal bowel sounds heard. Musculoskeletal: no clubbing / cyanosis. No joint deformity upper and lower extremities. Good ROM, no contractures. Normal muscle tone.  Skin: no rashes, lesions, ulcers. No induration Neurologic: CN 2-12 grossly intact. Sensation intact, DTR normal. Strength 5/5 in all 4 limbs.  Psychiatric: Normal judgment and insight. Alert and oriented x 3. Normal mood.     Labs on Admission: I have personally reviewed following labs and imaging studies  CBC: Recent Labs  Lab 03/16/23 2012 03/17/23 0446  WBC 7.0 7.4  NEUTROABS  --  3.8  HGB 6.3* 5.6*  HCT 25.4* 22.5*  MCV 63.0* 63.0*  PLT 285 259    Basic Metabolic  Panel: Recent Labs  Lab 03/16/23 2012 03/17/23 0446  NA 135 137  K 3.9 3.6  CL 105 110  CO2 21* 21*  GLUCOSE 88 92  BUN 17 14  CREATININE 0.76 0.43*  CALCIUM 8.8* 8.5*  MG  --  1.8    Liver Function Tests: Recent Labs  Lab 03/16/23 2012 03/17/23 0446  AST 19 16  ALT 14 12  ALKPHOS 67 60  BILITOT 1.2 1.1  PROT 8.4* 7.6  ALBUMIN 3.4* 3.1*    Urine analysis:    Component Value Date/Time   COLORURINE COLORLESS (A) 03/25/2021 0732   APPEARANCEUR CLEAR 03/25/2021 0732   LABSPEC 1.006 03/25/2021 0732   PHURINE 7.0 03/25/2021 0732   GLUCOSEU NEGATIVE 03/25/2021 0732   HGBUR NEGATIVE 03/25/2021 0732   HGBUR small 12/04/2008 0950   BILIRUBINUR NEGATIVE 03/25/2021 0732   BILIRUBINUR negative 03/16/2018 1009   BILIRUBINUR NEG 08/14/2015 1055   KETONESUR NEGATIVE 03/25/2021 0732   PROTEINUR 30 (A) 03/25/2021 0732   UROBILINOGEN 0.2 03/16/2018 1009   UROBILINOGEN 1.0 12/04/2008 0950   NITRITE NEGATIVE 03/25/2021 0732   LEUKOCYTESUR NEGATIVE 03/25/2021 0732     Radiological Exams on Admission: No results found.   Assessment/Plan Principal Problem:   Acute blood loss anemia Active Problems:   S/P laparoscopic sleeve gastrectomy   Hyperlipidemia LDL goal <70   Obesity   HYPERTENSION, BENIGN ESSENTIAL   Diabetes mellitus (HCC)   Iron deficiency anemia due to chronic blood loss   Symptomatic anemia     Iron deficiency anemia/symptomatic anemia Could be multifactorial due to sleeve gastrectomy and poor absorption, slow GI blood loss versus other etiologies. GI consulted for consideration for EGD and colonoscopy. IV iron and eventually at discharge consider oral iron and if hemoglobin does not stay up and stable, may need to get periodic IV iron transfusions through hematologist.   Outpatient hematology consultation and follow-up.  Will discuss with hematologist on-call regarding echo administration. No blood transfusions due to patient's preference/religious  beliefs.  Discussed in detail with patient, spouse and patient's sister at bedside regarding risks and benefits of blood transfusion including dangerous life-threatening problems that could arise if her hemoglobin drops further and significantly.  They verbalized understanding. Follow CBC in a.m. Clear liquid diet pending GI consultation P.o. Protonix 40 Mg daily although low index of suspicion for upper GI bleed Will also  consult dietitian regarding iron rich foods.  Type II DM in obese Missed [name] for a week.  Hold while hospitalized SSI while here and titrate as needed  Essential hypertension Presented with slight hypertension which has since improved. Noncompliant with antihypertensives at home. If blood pressure start to consistently rise, consider resuming home dose of lisinopril and monitoring.  Hyperlipidemia Noncompliant with statins  Alcohol use disorder Patient volunteers to less than what she actually consumes according to spouse at bedside who feels that she drinks anywhere between 3-5 beers daily, 12 ounces each. Moderation and eventual cessation counseled. CIWA protocol.  Body mass index is 51.42 kg/m.  Morbid obesity Lifestyle modifications and outpatient follow-up.  S/p laparoscopic sleeve gastrectomy Lost to follow-up with surgeon.  Recommended making an appointment for recommendations  Medical noncompliance Extensively counseled patient and spouse at bedside.    DVT prophylaxis: No chemical prophylaxis given degree of anemia.  SCDs. Code Status: Full Family Communication: Spouse and patient's sister at bedside Disposition Plan:   Patient is from: Home  Anticipated DC to: Home  Anticipated DC date: 9/21  Anticipated DC barriers: None   Consults called: GI/Dr. Loreta Ave Admission status: Observation, medical bed  Severity of Illness: The appropriate patient status for this patient is OBSERVATION. Observation status is judged to be reasonable and  necessary in order to provide the required intensity of service to ensure the patient's safety. The patient's presenting symptoms, physical exam findings, and initial radiographic and laboratory data in the context of their medical condition is felt to place them at decreased risk for further clinical deterioration. Furthermore, it is anticipated that the patient will be medically stable for discharge from the hospital within 2 midnights of admission.     Marcellus Scott MD FACP, [facility], [facility], [facility]   [facility] Phys[facility] Advisor [place]    To contact the attending provider between 7A-7P or the covering provider during after hours 7P-7A, please log into the web site [email or web address] and access using universal [place] password for that web site. If you do not have the password, please call the hospital operator.  03/17/2023, 8:34 AM

## 2023-03-17 NOTE — Progress Notes (Signed)
MEDICATION RELATED CONSULT NOTE - INITIAL   Pharmacy Consult for IV Iron Indication: symptomatic anemia  No Known Allergies  Patient Measurements: Height: 5\' 5"  (165.1 cm) Weight: (!) 140.2 kg (309 lb) IBW/kg (Calculated) : 57  Vital Signs: Temp: 99.3 F (37.4 C) (09/19 0947) Temp Source: Oral (09/19 0947) BP: 143/85 (09/19 0947) Pulse Rate: 87 (09/19 0947) Intake/Output from previous day: 09/18 0701 - 09/19 0700 In: 1000 [IV Piggyback:1000] Out: -  Intake/Output from this shift: No intake/output data recorded.  Labs: Recent Labs    03/16/23 2012 03/17/23 0446  WBC 7.0 7.4  HGB 6.3* 5.6*  HCT 25.4* 22.5*  PLT 285 259  CREATININE 0.76 0.43*  MG  --  1.8  ALBUMIN 3.4* 3.1*  PROT 8.4* 7.6  AST 19 16  ALT 14 12  ALKPHOS 67 60  BILITOT 1.2 1.1   Estimated Creatinine Clearance: 103.9 mL/min (A) (by C-G formula based on SCr of 0.43 mg/dL (L)).   Microbiology: No results found for this or any previous visit (from the past 720 hour(s)).  Medical History: 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   Diabetes mellitus without complication (HCC)    type 2   DJD (degenerative joint disease)    both knees right worse than left   Headache    Hyperlipidemia    Hypertension    Intramural leiomyoma of uterus    Joint pain    Knee pain    Lactose intolerance    Lower extremity edema    Morbid obesity with BMI of 50.0-59.9, adult (HCC)    Osteoarthritis    Vitamin D deficiency     Assessment: Active Problem(s):  Low hemoglobin.  Exertional dyspnea and palpitations.   PMH:  laparoscopic gastric sleeve resection 2018 (lost to follow-up), noncompliant with recommended multivitamins thereafter, DM, HTN, HLD, morbid obesity, ACD, back pain, carpal tunnel, DJD, HAs, joint pains, lactose interance, LE edema, morbid obesity, OA, Vit D def,   AC/Heme:  Jehovah's Witness (no blood  products),  Iron 12, Ferritin 1, Sats 3 Hgb 5.6  IV Iron [place] equation 2653mg  iron defecit   Goal of Therapy:  Iron replacement to average Hgb 12  Plan:  Venofer 100mg  IV x 1 to start from order set. Will scheduled Venofer 500mg  IV q24h x 2d, then consider supplementing the rest (ex 500mg  IV weekly x 3) as outpatient to prevent acute iron overload.   Rice Walsh S. Merilynn Finland, PharmD, BCPS Clinical Staff Pharmacist [email or web address] Merilynn Finland, Yanette [place]i Stillinger 03/17/2023,10:45 AM

## 2023-03-17 NOTE — ED Notes (Signed)
ED TO INPATIENT HANDOFF REPORT  Name/Age/Gender Precious Bard Olivarez 62 y.o. female  Code Status    Code Status Orders  (From admission, onward)           Start     Ordered   03/17/23 0829  Full code  Continuous       Question:  By:  Answer:  Consent: discussion documented in EHR   03/17/23 0830           Code Status History     Date Active Date Inactive Code Status Order ID Comments User Context   03/17/2023 0214 03/17/2023 0831 Full Code [id number]  Angie Fava, DO ED   06/07/2017 1346 06/08/2017 1955 Full Code [id number]  Kinsinger, De Blanch, MD Inpatient       Home/SNF/Other Home  Chief Complaint Acute blood loss anemia [D62] Iron deficiency anemia due to chronic blood loss [D50.0]  Level of Care/Admitting Diagnosis ED Disposition     ED Disposition  Admit   Condition  --   Comment  Hospital Area: [facility] [[id number]]  Level of Care: Med-Surg [16]  May place patient in observation at [facility] or [facility] if equivalent level of care is available:: No  Covid Evaluation: Asymptomatic - no recent exposure (last 10 days) testing not required  Diagnosis: Iron deficiency anemia due to chronic blood loss [782956]  Admitting Physician: Elease Etienne [[id number]]  Attending Physician: Marcellus Scott D [[id number]]          Medical History 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   Diabetes mellitus without complication (HCC)    type 2   DJD (degenerative joint disease)    both knees right worse than left   Headache    Hyperlipidemia    Hypertension    Intramural leiomyoma of uterus    Joint pain    Knee pain    Lactose intolerance    Lower extremity edema    Morbid obesity with BMI of 50.0-59.9, adult (HCC)    Osteoarthritis    Vitamin D deficiency     Allergies No Known Allergies  IV Location/Drains/Wounds Patient  Lines/Drains/Airways Status     Active Line/Drains/Airways     Name Placement date Placement time Site Days   Peripheral IV 03/17/23 22 G Posterior;Right Hand 03/17/23  0025  Hand  less than 1   Peripheral IV 03/17/23 20 G 1.88" Anterior;Proximal;Right Forearm 03/17/23  0119  Forearm  less than 1   Incision - 6 Ports Abdomen Right;Lateral Right;Medial Left;Umbilicus Mid;Upper 06/07/17  0908  -- 2109            Labs/Imaging Results for orders placed or performed during the hospital encounter of 03/16/23 (from the past 48 hour(s))  Comprehensive metabolic panel     Status: Abnormal   Collection Time: 03/16/23  8:12 PM  Result Value Ref Range   Sodium 135 135 - 145 mmol/L   Potassium 3.9 3.5 - 5.1 mmol/L   Chloride 105 98 - 111 mmol/L   CO2 21 (L) 22 - 32 mmol/L   Glucose, Bld 88 70 - 99 mg/dL    Comment: Glucose reference range applies only to samples taken after fasting for at least 8 hours.   BUN 17 8 - 23 mg/dL   Creatinine, Ser 2.13 0.44 - 1.00 mg/dL   Calcium  8.8 (L) 8.9 - 10.3 mg/dL   Total Protein 8.4 (H) 6.5 - 8.1 g/dL   Albumin 3.4 (L) 3.5 - 5.0 g/dL   AST 19 15 - 41 U/L   ALT 14 0 - 44 U/L   Alkaline Phosphatase 67 38 - 126 U/L   Total Bilirubin 1.2 0.3 - 1.2 mg/dL   GFR, Estimated >04 >54 mL/min    Comment: (NOTE) Calculated using the CKD-EPI Creatinine Equation (2021)    Anion gap 9 5 - 15    Comment: Performed at [facility], [address] [place], [place] [address]  CBC     Status: Abnormal   Collection Time: 03/16/23  8:12 PM  Result Value Ref Range   WBC 7.0 4.0 - 10.5 K/uL   RBC 4.03 3.87 - 5.11 MIL/uL   Hemoglobin 6.3 (LL) 12.0 - 15.0 g/dL    Comment: Reticulocyte Hemoglobin testing may be clinically indicated, consider ordering this additional test [id number] THIS CRITICAL RESULT HAS VERIFIED AND BEEN CALLED TO MELINDA B, RN BY REGINA MANGUM ON 09 18 2024 AT 2101, AND HAS BEEN READ BACK.     HCT 25.4 (L) 36.0 - 46.0 %   MCV  63.0 (L) 80.0 - 100.0 fL   MCH 15.6 (L) 26.0 - 34.0 pg   MCHC 24.8 (L) 30.0 - 36.0 g/dL   RDW 62.1 (H) 30.8 - 65.7 %   Platelets 285 150 - 400 K/uL   nRBC 0.0 0.0 - 0.2 %    Comment: Performed at [facility], [address], [place], [place] [address]  Type and screen [facility]     Status: None   Collection Time: 03/16/23  8:12 PM  Result Value Ref Range   ABO/RH(D) AB NEG    Antibody Screen NEG    Sample Expiration      03/19/2023,2359 Performed at [facility], [address], [place], [place] [address]   POC occult blood, ED     Status: None   Collection Time: 03/17/23  1:40 AM  Result Value Ref Range   Fecal Occult Bld NEGATIVE NEGATIVE  POC occult blood, ED     Status: Abnormal   Collection Time: 03/17/23  1:51 AM  Result Value Ref Range   Fecal Occult Bld POSITIVE (A) NEGATIVE  Iron and TIBC     Status: Abnormal   Collection Time: 03/17/23  2:40 AM  Result Value Ref Range   Iron 12 (L) 28 - 170 ug/dL   TIBC 413 244 - 010 ug/dL   Saturation Ratios 3 (L) 10.4 - 31.8 %   UIBC 370 ug/dL    Comment: Performed at [facility], [address], [place], [place] [address]  Ferritin     Status: Abnormal   Collection Time: 03/17/23  2:40 AM  Result Value Ref Range   Ferritin 1 (L) 11 - 307 ng/mL    Comment: Performed at [facility], [address] [place], [place] [address]  Protime-INR     Status: None   Collection Time: 03/17/23  2:40 AM  Result Value Ref Range   Prothrombin Time 15.1 11.4 - 15.2 seconds   INR 1.2 0.8 - 1.2    Comment: (NOTE) INR goal varies based on device and disease states. Performed at [facility], [address], [place], [place] [address]   Vitamin B12     Status: Abnormal   Collection Time: 03/17/23  2:40  AM  Result Value Ref Range   Vitamin B-12 1,654 (H) 180 - 914 pg/mL    Comment: RESULT CONFIRMED BY MANUAL  DILUTION (NOTE) This assay is not validated for testing neonatal or myeloproliferative syndrome specimens for Vitamin B12 levels. Performed at [facility], [address] [place], [place] [address]   Folate     Status: None   Collection Time: 03/17/23  2:40 AM  Result Value Ref Range   Folate 9.4 >5.9 ng/mL    Comment: Performed at [facility], [address] [place], [place] [address]  CBC with Differential/Platelet     Status: Abnormal   Collection Time: 03/17/23  4:46 AM  Result Value Ref Range   WBC 7.4 4.0 - 10.5 K/uL   RBC 3.57 (L) 3.87 - 5.11 MIL/uL   Hemoglobin 5.6 (LL) 12.0 - 15.0 g/dL    Comment: Reticulocyte Hemoglobin testing may be clinically indicated, consider ordering this additional test [id number] THIS CRITICAL RESULT HAS VERIFIED AND BEEN CALLED TO RIVERS, T. RN BY JOLANDE MECIAL ON 09 19 2024 AT 0600, AND HAS BEEN READ BACK. REPEATED TO VERIFY    HCT 22.5 (L) 36.0 - 46.0 %   MCV 63.0 (L) 80.0 - 100.0 fL   MCH 15.7 (L) 26.0 - 34.0 pg   MCHC 24.9 (L) 30.0 - 36.0 g/dL   RDW 32.9 (H) 51.8 - 84.1 %   Platelets 259 150 - 400 K/uL   nRBC 0.0 0.0 - 0.2 %   Neutrophils Relative % 51 %   Neutro Abs 3.8 1.7 - 7.7 K/uL   Lymphocytes Relative 32 %   Lymphs Abs 2.3 0.7 - 4.0 K/uL   Monocytes Relative 12 %   Monocytes Absolute 0.9 0.1 - 1.0 K/uL   Eosinophils Relative 4 %   Eosinophils Absolute 0.3 0.0 - 0.5 K/uL   Basophils Relative 1 %   Basophils Absolute 0.0 0.0 - 0.1 K/uL   Immature Granulocytes 0 %   Abs Immature Granulocytes 0.02 0.00 - 0.07 K/uL   Dimorphism PRESENT    Polychromasia PRESENT     Comment: Performed at [facility], [address], [place], [place] [address]  Comprehensive metabolic panel     Status: Abnormal   Collection Time: 03/17/23  4:46 AM  Result Value Ref Range   Sodium 137 135 - 145 mmol/L   Potassium 3.6 3.5 - 5.1 mmol/L   Chloride 110 98 - 111 mmol/L   CO2 21 (L) 22 -  32 mmol/L   Glucose, Bld 92 70 - 99 mg/dL    Comment: Glucose reference range applies only to samples taken after fasting for at least 8 hours.   BUN 14 8 - 23 mg/dL   Creatinine, Ser 0.16 (L) 0.44 - 1.00 mg/dL   Calcium 8.5 (L) 8.9 - 10.3 mg/dL   Total Protein 7.6 6.5 - 8.1 g/dL   Albumin 3.1 (L) 3.5 - 5.0 g/dL   AST 16 15 - 41 U/L   ALT 12 0 - 44 U/L   Alkaline Phosphatase 60 38 - 126 U/L   Total Bilirubin 1.1 0.3 - 1.2 mg/dL   GFR, Estimated >01 >09 mL/min    Comment: (NOTE) Calculated using the CKD-EPI Creatinine Equation (2021)    Anion gap 6 5 - 15    Comment: Performed at [facility], [address], [place], [place] [address]  Magnesium     Status: None   Collection Time: 03/17/23  4:46 AM  Result Value Ref Range   Magnesium 1.8 1.7 - 2.4 mg/dL    Comment: Performed at [facility], [address], [place], [place] [address]  Reticulocytes     Status: Abnormal   Collection Time: 03/17/23  4:46 AM  Result Value Ref Range   Retic Ct Pct 1.9 0.4 - 3.1 %   RBC. 3.73 (L) 3.87 - 5.11 MIL/uL   Retic Count, Absolute 72.4 19.0 - 186.0 K/uL   Immature Retic Fract 20.8 (H) 2.3 - 15.9 %    Comment: Performed at [facility], [address] [place], [place] [address]   No results found.  Pending Labs Unresulted Labs (From admission, onward)     Start     Ordered   03/17/23 0853  Hemoglobin A1c  Once,   R       Comments: To assess prior glycemic control    03/17/23 0852   Signed and Held  CBC  Tomorrow morning,   R        Signed and Held   Signed and Held  Basic metabolic panel  Tomorrow morning,   R        Signed and Held            Vitals/Pain Today's Vitals   03/17/23 0400 03/17/23 0600 03/17/23 0727 03/17/23 0730  BP: 134/87 (!) 114/56  (!) 129/52  Pulse: (!) 105 (!) 110  92  Resp: 16 (!) 23  16  Temp:   98 F (36.7 C)   TempSrc:   Oral   SpO2: 98% 99%  100%  Weight:      Height:      PainSc:         Isolation Precautions No active isolations  Medications Medications  acetaminophen (TYLENOL) tablet 650 mg (has no administration in time range)    Or  acetaminophen (TYLENOL) suppository 650 mg (has no administration in time range)  ondansetron (ZOFRAN) injection 4 mg (has no administration in time range)  insulin aspart (novoLOG) injection 0-9 Units (has no administration in time range)  sodium chloride 0.9 % bolus 1,000 mL (0 mLs Intravenous Stopped 03/17/23 0511)    Mobility walks

## 2023-03-17 NOTE — Consult Note (Signed)
Reason for Consult: Anemia, heme positive stool Referring Physician: [facility]  Anaissa Diane Boonstra HPI: This is a 62 year old femalwith with a PMH of gastric sleep 2018, HTN, DM, hyperlipidemia, and morbid obesity admitted for a finding of an anemia.  She recently established with a new PCP and she had blood work that showed her HGB was 6 g/dL.  She was advised to present to the ER for further evaluation.  Over the past two months she experienced DOE, weakness, and palpitations.  These symptoms progressed to the point that minimal exertion brought about these symptoms.  The patient denies any issues with hematemesis, hematochezia, or melena, but she does report some recent epistaxis.  A Cologuard was performed one year ago and it was negative.  Greggory Keen was started for her DM 2 years ago.  The patient is a Scientist, product/process development.  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   Diabetes mellitus without complication (HCC)    type 2   DJD (degenerative joint disease)    both knees right worse than left   Headache    Hyperlipidemia    Hypertension    Intramural leiomyoma of uterus    Joint pain    Knee pain    Lactose intolerance    Lower extremity edema    Morbid obesity with BMI of 50.0-59.9, adult (HCC)    Osteoarthritis    Vitamin D deficiency     Past Surgical History:  Procedure Laterality Date   BREAST BIOPSY Left 01/04/2023   [place] LT BREAST BX W LOC DEV 1ST LESION IMG BX SPEC US GUIDE 01/04/2023 [facility]   KNEE SURGERY Right 1999   torn cartlidge repair   LAPAROSCOPIC GASTRIC SLEEVE RESECTION N/A 06/07/2017   Procedure: LAPAROSCOPIC GASTRIC SLEEVE RESECTION, UPPER ENDOSCOPY;  Surgeon: Sheliah Hatch, De Blanch, MD;  Location: [facility];  Service: General;  Laterality: N/A;    Family History  Problem Relation Age of Onset   Diabetes Sister    Hypertension Sister    Diabetes Maternal Aunt     Breast cancer Maternal Aunt 29   Hypertension Mother    Diabetes Father     Social History:  reports that she has never smoked. She has never used smokeless tobacco. She reports current alcohol use of about 14.0 standard drinks of alcohol per week. She reports that she does not use drugs.  Allergies: No Known Allergies  Medications: Scheduled:  folic acid  1 mg Oral Daily   insulin aspart  0-9 Units Subcutaneous TID WC   multivitamin with minerals  1 tablet Oral Daily   pantoprazole  40 mg Oral Daily   senna  1 tablet Oral BID   sodium chloride flush  3 mL Intravenous Q12H   thiamine  100 mg Oral Daily   Or   thiamine  100 mg Intravenous Daily   Continuous:  [START ON 03/18/2023] iron sucrose      Results for orders placed or performed during the hospital encounter of 03/16/23 (from the past 24 hour(s))  Comprehensive metabolic panel     Status: Abnormal   Collection Time: 03/16/23  8:12 PM  Result Value Ref Range   Sodium 135 135 - 145 mmol/L   Potassium 3.9 3.5 - 5.1 mmol/L   Chloride 105 98 - 111 mmol/L   CO2 21 (L) 22 - 32  mmol/L   Glucose, Bld 88 70 - 99 mg/dL   BUN 17 8 - 23 mg/dL   Creatinine, Ser 4.33 0.44 - 1.00 mg/dL   Calcium 8.8 (L) 8.9 - 10.3 mg/dL   Total Protein 8.4 (H) 6.5 - 8.1 g/dL   Albumin 3.4 (L) 3.5 - 5.0 g/dL   AST 19 15 - 41 U/L   ALT 14 0 - 44 U/L   Alkaline Phosphatase 67 38 - 126 U/L   Total Bilirubin 1.2 0.3 - 1.2 mg/dL   GFR, Estimated >29 >51 mL/min   Anion gap 9 5 - 15  CBC     Status: Abnormal   Collection Time: 03/16/23  8:12 PM  Result Value Ref Range   WBC 7.0 4.0 - 10.5 K/uL   RBC 4.03 3.87 - 5.11 MIL/uL   Hemoglobin 6.3 (LL) 12.0 - 15.0 g/dL   HCT 88.4 (L) 16.6 - 06.3 %   MCV 63.0 (L) 80.0 - 100.0 fL   MCH 15.6 (L) 26.0 - 34.0 pg   MCHC 24.8 (L) 30.0 - 36.0 g/dL   RDW 01.6 (H) 01.0 - 93.2 %   Platelets 285 150 - 400 K/uL   nRBC 0.0 0.0 - 0.2 %  [facility]     Status: None   Collection  Time: 03/16/23  8:12 PM  Result Value Ref Range   ABO/RH(D) AB NEG    Antibody Screen NEG    Sample Expiration      03/19/2023,2359 Performed at [facility], [address], [place], [place] [address]   POC occult blood, ED     Status: None   Collection Time: 03/17/23  1:40 AM  Result Value Ref Range   Fecal Occult Bld NEGATIVE NEGATIVE  POC occult blood, ED     Status: Abnormal   Collection Time: 03/17/23  1:51 AM  Result Value Ref Range   Fecal Occult Bld POSITIVE (A) NEGATIVE  Iron and TIBC     Status: Abnormal   Collection Time: 03/17/23  2:40 AM  Result Value Ref Range   Iron 12 (L) 28 - 170 ug/dL   TIBC 220 254 - 270 ug/dL   Saturation Ratios 3 (L) 10.4 - 31.8 %   UIBC 370 ug/dL  Ferritin     Status: Abnormal   Collection Time: 03/17/23  2:40 AM  Result Value Ref Range   Ferritin 1 (L) 11 - 307 ng/mL  Protime-INR     Status: None   Collection Time: 03/17/23  2:40 AM  Result Value Ref Range   Prothrombin Time 15.1 11.4 - 15.2 seconds   INR 1.2 0.8 - 1.2  Vitamin B12     Status: Abnormal   Collection Time: 03/17/23  2:40 AM  Result Value Ref Range   Vitamin B-12 1,654 (H) 180 - 914 pg/mL  Folate     Status: None   Collection Time: 03/17/23  2:40 AM  Result Value Ref Range   Folate 9.4 >5.9 ng/mL  CBC with Differential/Platelet     Status: Abnormal   Collection Time: 03/17/23  4:46 AM  Result Value Ref Range   WBC 7.4 4.0 - 10.5 K/uL   RBC 3.57 (L) 3.87 - 5.11 MIL/uL   Hemoglobin 5.6 (LL) 12.0 - 15.0 g/dL   HCT 62.3 (L) 76.2 - 83.1 %   MCV 63.0 (L) 80.0 - 100.0 fL   MCH 15.7 (L) 26.0 - 34.0 pg   MCHC 24.9 (L)  30.0 - 36.0 g/dL   RDW 69.6 (H) 29.5 - 28.4 %   Platelets 259 150 - 400 K/uL   nRBC 0.0 0.0 - 0.2 %   Neutrophils Relative % 51 %   Neutro Abs 3.8 1.7 - 7.7 K/uL   Lymphocytes Relative 32 %   Lymphs Abs 2.3 0.7 - 4.0 K/uL   Monocytes Relative 12 %   Monocytes Absolute 0.9 0.1 - 1.0 K/uL   Eosinophils Relative 4 %   Eosinophils  Absolute 0.3 0.0 - 0.5 K/uL   Basophils Relative 1 %   Basophils Absolute 0.0 0.0 - 0.1 K/uL   Immature Granulocytes 0 %   Abs Immature Granulocytes 0.02 0.00 - 0.07 K/uL   Dimorphism PRESENT    Polychromasia PRESENT   Comprehensive metabolic panel     Status: Abnormal   Collection Time: 03/17/23  4:46 AM  Result Value Ref Range   Sodium 137 135 - 145 mmol/L   Potassium 3.6 3.5 - 5.1 mmol/L   Chloride 110 98 - 111 mmol/L   CO2 21 (L) 22 - 32 mmol/L   Glucose, Bld 92 70 - 99 mg/dL   BUN 14 8 - 23 mg/dL   Creatinine, Ser 1.32 (L) 0.44 - 1.00 mg/dL   Calcium 8.5 (L) 8.9 - 10.3 mg/dL   Total Protein 7.6 6.5 - 8.1 g/dL   Albumin 3.1 (L) 3.5 - 5.0 g/dL   AST 16 15 - 41 U/L   ALT 12 0 - 44 U/L   Alkaline Phosphatase 60 38 - 126 U/L   Total Bilirubin 1.1 0.3 - 1.2 mg/dL   GFR, Estimated >44 >01 mL/min   Anion gap 6 5 - 15  Magnesium     Status: None   Collection Time: 03/17/23  4:46 AM  Result Value Ref Range   Magnesium 1.8 1.7 - 2.4 mg/dL  Reticulocytes     Status: Abnormal   Collection Time: 03/17/23  4:46 AM  Result Value Ref Range   Retic Ct Pct 1.9 0.4 - 3.1 %   RBC. 3.73 (L) 3.87 - 5.11 MIL/uL   Retic Count, Absolute 72.4 19.0 - 186.0 K/uL   Immature Retic Fract 20.8 (H) 2.3 - 15.9 %  Hemoglobin A1c     Status: None   Collection Time: 03/17/23  5:00 AM  Result Value Ref Range   Hgb A1c MFr Bld 5.5 4.8 - 5.6 %   Mean Plasma Glucose 111.15 mg/dL  Glucose, capillary     Status: None   Collection Time: 03/17/23 10:03 AM  Result Value Ref Range   Glucose-Capillary 76 70 - 99 mg/dL  Glucose, capillary     Status: Abnormal   Collection Time: 03/17/23  4:07 PM  Result Value Ref Range   Glucose-Capillary 107 (H) 70 - 99 mg/dL     No results found.  ROS:  As stated above in the HPI otherwise negative.  Blood pressure 121/69, pulse 82, temperature 99 F (37.2 C), resp. rate 18, height 5\' 5"  (1.651 m), weight (!) 140.2 kg, SpO2 100%.    PE: Gen: NAD, Alert and  Oriented HEENT:  [place]/AT, EOMI Neck: Supple, no LAD Lungs: CTA Bilaterally CV: RRR without M/G/R ABD: Soft, NTND, +BS Ext: No C/C/E  Assessment/Plan: 1) Severe and symptomatic anemia. 2) Heme positive stool. 3) S/p sleeve gastrectomy.   The patient's HGB is too low to perform an EGD safely.  She would benefit from the EGD, but anesthesia will not approve of performing an endoscopic  procedure.  Unless her clinical status become life-threatening endoscopic evaluation is not possible.  The thought is that she may have an esophagitis.  Sleeve gastrectomy is prone to esophagitis and she started having problems after the sleeve.  However, she learned to avoid eating before lying down.  With this maneuver she managed her GERD symptoms.  [name] does cause a gastroparesis, but she does not report a worsening of her GERD with the medication.    Plan: 1) PPI every day. 2) Agree with iron infusion. 3) Upon discharge she can follow up in the office. 4) Signing off.  Call with any questions.  Mercadez Heitman D 03/17/2023, 4:52 PM

## 2023-03-17 NOTE — Consult Note (Signed)
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  She has received intravenous iron infusion.  The patient has declined blood transfusion due to religious reasons Unfortunately, it takes 7 to 10 days for effective erythropoiesis so I do not anticipate her blood count will improve to a safe level prior to discharge I anticipate potential other nutritional deficiencies such as thiamine and copper deficiencies EPO level has been drawn She will only qualify for erythropoietin stimulating agent if her serum erythropoietin level is less than 500 I agree with plan for intravenous iron today and tomorrow prior to discharge  I will set up outpatient follow-up with further intravenous iron infusion in the outpatient clinic She would benefit from outpatient GI evaluation to rule out GI blood loss  History of vitamin D deficiencies I will order a vitamin D level tomorrow due to potential risk of severe deficiency contributing to her joint pain The patient is advised to avoid NSAID  Discharge planning Unknown I will see her again tomorrow for follow-up  All questions were answered. The patient knows to call the clinic with any problems, questions or concerns.  The total time spent in the appointment was 80 minutes encounter with patients including review of chart and various tests results, discussions about plan of care and coordination of care plan  Artis Delay, MD 9/19/20242:37 PM   CHIEF COMPLAINTS/PURPOSE OF CONSULTATION:  Anemia  HISTORY OF PRESENTING ILLNESS:  Valerie Carter 62 y.o. female is here because of anemia  She was found to have abnormal CBC from recent blood work The patient have history of laparoscopic gastric sleeve resection in  2018. The patient was advised to take chronic vitamin supplementation but she stopped taking them approximately 3 years ago.  She had regained a lot of weight and then was placed on [name] for the last 2 years and she has lost 50 pounds. Most recently, she noted to have progressive symptoms of anemia with shortness of breath on minimal exertion, pre-syncopal episodes, and palpitations. She had not noticed any recent bleeding such as hematuria or hematochezia.  She had spontaneous epistaxis 2 weeks ago but that has resolved.  She denies abnormal postmenopausal bleeding She is not on antiplatelets agents.  She takes ibuprofen on a regular basis for chronic joint pain and back pain. She has not have colonoscopy done.  She has chronic constipation secondary to [facility] which she take MiraLAX on a regular basis. She never donated blood or received blood transfusion Due to religious reason, she has declined transfusion.  Her blood count has dropped since admission to 5.6 with low MCV.  Iron studies confirm severe iron deficiency anemia.  Serum vitamin B12 and folate were normal.  She does not have chronic kidney disease.  Family is wondering whether she would qualify for erythropoietin stimulating agents  MEDICAL HISTORY:  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   Diabetes mellitus without complication (HCC)    type 2   DJD (degenerative joint disease)    both knees right worse than left   Headache    Hyperlipidemia    Hypertension    Intramural leiomyoma  of uterus    Joint pain    Knee pain    Lactose intolerance    Lower extremity edema    Morbid obesity with BMI of 50.0-59.9, adult (HCC)    Osteoarthritis    Vitamin D deficiency     SURGICAL HISTORY: Past Surgical History:  Procedure Laterality Date   BREAST BIOPSY Left 01/04/2023   [place] LT BREAST BX W LOC DEV 1ST LESION IMG BX SPEC US GUIDE  01/04/2023 [facility] MAMMOGRAPHY   KNEE SURGERY Right 1999   torn cartlidge repair   LAPAROSCOPIC GASTRIC SLEEVE RESECTION N/A 06/07/2017   Procedure: LAPAROSCOPIC GASTRIC SLEEVE RESECTION, UPPER ENDOSCOPY;  Surgeon: Sheliah Hatch, De Blanch, MD;  Location: [facility];  Service: General;  Laterality: N/A;    SOCIAL HISTORY: Social History   Socioeconomic History   Marital status: Single    Spouse name: Not on file   Number of children: 1   Years of education: Not on file   Highest education level: Associate degree: occupational, Scientist, product/process development, or vocational program  Occupational History   Occupation: school bus driver  Tobacco Use   Smoking status: Never   Smokeless tobacco: Never  Vaping Use   Vaping status: Never Used  Substance and Sexual Activity   Alcohol use: Yes    Alcohol/week: 14.0 standard drinks of alcohol    Types: 14 Cans of beer per week   Drug use: No   Sexual activity: Never  Other Topics Concern   Not on file  Social History Narrative   Not on file   Social Determinants of Health   Financial Resource Strain: Low Risk  (10/18/2022)   Overall Financial Resource Strain (CARDIA)    Difficulty of Paying Living Expenses: Not hard at all  Food Insecurity: No Food Insecurity (03/17/2023)   Hunger Vital Sign    Worried About Running Out of Food in the Last Year: Never true    Ran Out of Food in the Last Year: Never true  Transportation Needs: No Transportation Needs (03/17/2023)   PRAPARE - Administrator, Civil Service (Medical): No    Lack of Transportation (Non-Medical): No  Physical Activity: Unknown (10/18/2022)   Exercise Vital Sign    Days of Exercise per Week: 0 days    Minutes of Exercise per Session: Not on file  Stress: Stress Concern Present (10/18/2022)   [employer] of Occupational Health - Occupational Stress Questionnaire    Feeling of Stress : To some extent  Social Connections: Unknown (10/18/2022)   Social Connection and Isolation Panel  [NHANES]    Frequency of Communication with Friends and Family: Not on file    Frequency of Social Gatherings with Friends and Family: Not on file    Attends Religious Services: Not on file    Active Member of Clubs or Organizations: Yes    Attends Banker Meetings: Not on file    Marital Status: Separated  Intimate Partner Violence: Not At Risk (03/17/2023)   Humiliation, Afraid, Rape, and Kick questionnaire    Fear of Current or Ex-Partner: No    Emotionally Abused: No    Physically Abused: No    Sexually Abused: No    FAMILY HISTORY: Family History  Problem Relation Age of Onset   Diabetes Sister    Hypertension Sister    Diabetes Maternal Aunt    Breast cancer Maternal Aunt 29   Hypertension Mother    Diabetes Father     ALLERGIES:  has No Known Allergies.  MEDICATIONS:  Current Facility-Administered Medications  Medication Dose Route Frequency Provider Last Rate Last Admin   acetaminophen (TYLENOL) tablet 650 mg  650 mg Oral Q6H PRN Hongalgi, Maximino Greenland, MD       Or   acetaminophen (TYLENOL) suppository 650 mg  650 mg Rectal Q6H PRN Hongalgi, Maximino Greenland, MD       folic acid (FOLVITE) tablet 1 mg  1 mg Oral Daily Hongalgi, Anand D, MD   1 mg at 03/17/23 1043   insulin aspart (novoLOG) injection 0-9 Units  0-9 Units Subcutaneous TID WC Hongalgi, Maximino Greenland, MD       [START ON 03/18/2023] iron sucrose (VENOFER) 500 mg in sodium chloride 0.9 % 250 mL IVPB  500 mg Intravenous Q24H Robertson, Crystal S, RPH       LORazepam (ATIVAN) tablet 1-4 mg  1-4 mg Oral Q1H PRN Hongalgi, Maximino Greenland, MD       Or   LORazepam (ATIVAN) tablet 0.5 mg  0.5 mg Oral Q6H PRN Hongalgi, Maximino Greenland, MD       multivitamin with minerals tablet 1 tablet  1 tablet Oral Daily Elease Etienne, MD   1 tablet at 03/17/23 1043   ondansetron (ZOFRAN) injection 4 mg  4 mg Intravenous Q6H PRN Hongalgi, Anand D, MD       pantoprazole (PROTONIX) EC tablet 40 mg  40 mg Oral Daily Hongalgi, Theadora Rama D, MD   40 mg at  03/17/23 1043   polyethylene glycol (MIRALAX / GLYCOLAX) packet 17 g  17 g Oral Daily PRN Hongalgi, Maximino Greenland, MD       senna (SENOKOT) tablet 8.6 mg  1 tablet Oral BID Marcellus Scott D, MD   8.6 mg at 03/17/23 1043   sodium chloride flush (NS) 0.9 % injection 3 mL  3 mL Intravenous Q12H Hongalgi, Theadora Rama D, MD   3 mL at 03/17/23 1043   thiamine (VITAMIN B1) tablet 100 mg  100 mg Oral Daily Marcellus Scott D, MD   100 mg at 03/17/23 1043   Or   thiamine (VITAMIN B1) injection 100 mg  100 mg Intravenous Daily Hongalgi, Maximino Greenland, MD        REVIEW OF SYSTEMS:   All other systems were reviewed with the patient and are negative.  PHYSICAL EXAMINATION: ECOG PERFORMANCE STATUS: 1 - Symptomatic but completely ambulatory  Vitals:   03/17/23 0947 03/17/23 1332  BP: (!) 143/85 121/69  Pulse: 87 82  Resp: 16 18  Temp: 99.3 F (37.4 C) 99 F (37.2 C)  SpO2: 100% 100%   Filed Weights   03/16/23 1912  Weight: (!) 309 lb (140.2 kg)    GENERAL:alert, no distress and comfortable SKIN: skin color, texture, turgor are normal, no rashes or significant lesions EYES: normal, conjunctiva are pale and non-injected, sclera clear OROPHARYNX:no exudate, no erythema and lips, buccal mucosa, and tongue normal  NECK: supple, thyroid normal size, non-tender, without nodularity LYMPH:  no palpable lymphadenopathy in the cervical, axillary or inguinal LUNGS: clear to auscultation and percussion with normal breathing effort HEART: regular rate & rhythm and no murmurs and no lower extremity edema ABDOMEN:abdomen soft, non-tender and normal bowel sounds Musculoskeletal:no cyanosis of digits and no clubbing  PSYCH: alert & oriented x 3 with fluent speech NEURO: no focal motor/sensory deficits

## 2023-03-17 NOTE — Progress Notes (Signed)
  Carryover admission to the Day Admitter.  I discussed this case with the EDP, Dr. Nicanor Alcon.  Per these discussions:   This is a 62 year old female who is Jehovah's Witness, status post bariatric surgery, who is being admitted for acute blood loss anemia in the setting of suspected gastrointestinal bleed after presenting for evaluation of acute anemia identified on routine outpatient labs.   The patient recently established with [facility] as her PCP, prompting screening labs that were performed this week, which were notable for hemoglobin of 6.3 relative to the patient's baseline hemoglobin of around 12, with most recent prior hemoglobin noted to be 11.9 in June 2024.  The patient was instructed by her new PCP to present to the emergency department for further evaluation and management of these findings.  Patient confirms that she is Jehovah's Witness and is status post bariatric surgery.  She has not noted any recent melena or hematochezia, and denies any recent vaginal bleeding.  She underwent Cologuard testing 1 year ago, which was negative.  She conveys that she has never previously undergone either colonoscopy or endoscopy.  Not on any blood thinners as an outpatient, no known history of underlying liver disease.  Denies any associated shortness of breath or chest pain.  Systolic blood pressures have been in the 140s to 150s mmHg. however, with movement, the patient demonstrates development of sinus tachycardia with heart rates into the 140s.   Labs in the ED this evening include CBC, which shows hemoglobin of 6.3 associated with microcytic/hypochromic findings as well as elevated RDW, platelet count 285.  DRE performed by EDP shows brown-colored stool that was found to be fecal occult blood positive.  EDP has contacted on-call GI, Dr. Loreta Ave, requesting formal consult in the AM, and has added on iron/TIBC studies.   I have placed an order for observation for further evaluation management of the  above.  I have placed some additional preliminary admit orders via the adult multi-morbid admission order set. I have also ordered n.p.o., and have added on a ferritin level in addition to the aforementioned iron studies.  Of also ordered INR, B12, folic acid, and reticulocyte count, in addition to ordering morning labs that include CMP, CBC, and magnesium level.      Newton Pigg, DO Hospitalist

## 2023-03-17 NOTE — ED Provider Notes (Signed)
[place] EMERGENCY DEPARTMENT AT [facility] Provider Note   CSN: [id number] Arrival date & time: 03/16/23  1817     History  Chief Complaint  Patient presents with   Low Hemoglobin     Valerie Carter is a 62 y.o. female.  The history is provided by the patient.  Illness Quality:  Patient established care with new doctor who ran labs and patient was called and hemoglobin was critically low.  Does have tachycardia with movement and ambulation.  No melena or BRBPR Severity:  Severe Timing:  Constant Progression:  Unchanged Chronicity:  New Context:  Last hemoglobin in June was in 11s Relieved by:  Nothing Worsened by:  Nothing Ineffective treatments:  None Associated symptoms: no diarrhea, no fever, no vomiting and no wheezing       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   Diabetes mellitus without complication (HCC)    type 2   DJD (degenerative joint disease)    both knees right worse than left   Headache    Hyperlipidemia    Hypertension    Intramural leiomyoma of uterus    Joint pain    Knee pain    Lactose intolerance    Lower extremity edema    Morbid obesity with BMI of 50.0-59.9, adult (HCC)    Osteoarthritis    Vitamin D deficiency      Home Medications Prior to Admission medications   Medication Sig Start Date End Date Taking? Authorizing Provider  Acetaminophen (TYLENOL ARTHRITIS PAIN PO) Take 1 tablet by mouth every 12 (twelve) hours.   Yes [provider]  Ibuprofen (MOTRIN IB PO) Take 2 tablets by mouth every 6 (six) hours as needed (Pain).   Yes [provider]  lisinopril (ZESTRIL) 5 MG tablet TAKE 1 TABLET BY MOUTH AT BEDTIME 08/26/21  Yes Shirlean Mylar, MD  rosuvastatin (CRESTOR) 20 MG tablet Take 1 tablet (20 mg total) by mouth daily. Patient not taking: Reported on 03/17/2023 10/20/22   Sabino Dick, DO   tirzepatide [facility]) 10 MG/0.5ML Pen Inject 10 mg into the skin once a week. 09/22/21  Yes Helane Rima, DO  Blood Pressure KIT Check your blood pressure 2-3 times a week 06/02/21   Dana Allan, MD  cyclobenzaprine (FLEXERIL) 10 MG tablet Take 1 tablet (10 mg total) by mouth 3 (three) times daily as needed for muscle spasms. Patient not taking: Reported on 10/18/2022 06/25/21   Dione Booze, MD  DULoxetine (CYMBALTA) 30 MG capsule Take 1 capsule (30 mg total) by mouth daily. Patient not taking: Reported on 03/17/2023 10/18/22   Sabino Dick, DO  gabapentin (NEURONTIN) 100 MG capsule Take 1 capsule (100 mg total) by mouth at bedtime. Patient not taking: Reported on 10/18/2022 09/18/21   Nestor Ramp, MD  HYDROcodone-acetaminophen [facility]) 10-325 MG tablet Take one tablet up to 4 times a day as directed for knee pain Patient not taking: Reported on 10/18/2022 01/22/22   Nestor Ramp, MD  metoprolol tartrate (LOPRESSOR) 25 MG tablet Take 1 tablet by mouth twice daily Patient not taking: Reported on 03/17/2023 01/06/22   Erick Alley, DO  Vitamin D, Ergocalciferol, (DRISDOL) 1.25 MG (50000 UNIT) CAPS capsule Take 1 capsule (50,000 Units total) by mouth every 7 (seven) days. Patient not taking: Reported on 10/18/2022 08/20/21   Helane Rima, DO  Allergies    Patient has no known allergies.    Review of Systems   Review of Systems  Constitutional:  Negative for fever.  HENT:  Negative for facial swelling.   Respiratory:  Negative for wheezing and stridor.   Gastrointestinal:  Negative for anal bleeding, blood in stool, diarrhea and vomiting.  Genitourinary:  Negative for vaginal bleeding.    Physical Exam Updated Vital Signs BP (!) 157/87   Pulse 88   Temp (!) 97.5 F (36.4 C) (Oral)   Resp 17   Ht 5\' 5"  (1.651 m)   Wt (!) 140.2 kg   SpO2 100%   BMI 51.42 kg/m  Physical Exam Vitals and nursing note reviewed. Exam conducted with a chaperone present.  Constitutional:       General: She is not in acute distress.    Appearance: Normal appearance. She is well-developed.  HENT:     Head: Normocephalic and atraumatic.     Nose: Nose normal.  Eyes:     Pupils: Pupils are equal, round, and reactive to light.  Cardiovascular:     Rate and Rhythm: Regular rhythm. Tachycardia present.     Pulses: Normal pulses.     Heart sounds: Normal heart sounds.  Pulmonary:     Effort: Pulmonary effort is normal. No respiratory distress.     Breath sounds: Normal breath sounds.  Abdominal:     General: Bowel sounds are normal. There is no distension.     Palpations: Abdomen is soft.     Tenderness: There is no abdominal tenderness. There is no guarding or rebound.  Genitourinary:    Rectum: Guaiac result positive.  Musculoskeletal:        General: Normal range of motion.     Cervical back: Neck supple.  Skin:    General: Skin is dry.     Capillary Refill: Capillary refill takes less than 2 seconds.     Findings: No erythema or rash.  Neurological:     General: No focal deficit present.     Mental Status: She is alert and oriented to person, place, and time.     Deep Tendon Reflexes: Reflexes normal.  Psychiatric:        Mood and Affect: Mood normal.        Behavior: Behavior normal.     ED Results / Procedures / Treatments   Labs (all labs ordered are listed, but only abnormal results are displayed) Results for orders placed or performed during the hospital encounter of 03/16/23  Comprehensive metabolic panel  Result Value Ref Range   Sodium 135 135 - 145 mmol/L   Potassium 3.9 3.5 - 5.1 mmol/L   Chloride 105 98 - 111 mmol/L   CO2 21 (L) 22 - 32 mmol/L   Glucose, Bld 88 70 - 99 mg/dL   BUN 17 8 - 23 mg/dL   Creatinine, Ser 4.01 0.44 - 1.00 mg/dL   Calcium 8.8 (L) 8.9 - 10.3 mg/dL   Total Protein 8.4 (H) 6.5 - 8.1 g/dL   Albumin 3.4 (L) 3.5 - 5.0 g/dL   AST 19 15 - 41 U/L   ALT 14 0 - 44 U/L   Alkaline Phosphatase 67 38 - 126 U/L   Total Bilirubin 1.2  0.3 - 1.2 mg/dL   GFR, Estimated >02 >72 mL/min   Anion gap 9 5 - 15  CBC  Result Value Ref Range   WBC 7.0 4.0 - 10.5 K/uL   RBC 4.03 3.87 - 5.11  MIL/uL   Hemoglobin 6.3 (LL) 12.0 - 15.0 g/dL   HCT 78.2 (L) 95.6 - 21.3 %   MCV 63.0 (L) 80.0 - 100.0 fL   MCH 15.6 (L) 26.0 - 34.0 pg   MCHC 24.8 (L) 30.0 - 36.0 g/dL   RDW 08.6 (H) 57.8 - 46.9 %   Platelets 285 150 - 400 K/uL   nRBC 0.0 0.0 - 0.2 %  Protime-INR  Result Value Ref Range   Prothrombin Time 15.1 11.4 - 15.2 seconds   INR 1.2 0.8 - 1.2  POC occult blood, ED  Result Value Ref Range   Fecal Occult Bld NEGATIVE NEGATIVE  POC occult blood, ED  Result Value Ref Range   Fecal Occult Bld POSITIVE (A) NEGATIVE  Type and screen [facility]  Result Value Ref Range   ABO/RH(D) AB NEG    Antibody Screen NEG    Sample Expiration      03/19/2023,2359 Performed at [facility], [address], [place], [place] [address]    No results found.   Radiology No results found.  Procedures Procedures    Medications Ordered in ED Medications  acetaminophen (TYLENOL) tablet 650 mg (has no administration in time range)    Or  acetaminophen (TYLENOL) suppository 650 mg (has no administration in time range)  ondansetron (ZOFRAN) injection 4 mg (has no administration in time range)  sodium chloride 0.9 % bolus 1,000 mL (1,000 mLs Intravenous New Bag/Given 03/17/23 0145)    ED Course/ Medical Decision Making/ A&P                                 Medical Decision Making Patient with low hemoglobin and tachycardia   Amount and/or Complexity of Data Reviewed External Data Reviewed: labs and notes.    Details: Previous notes reviewed as well as labs  Labs: ordered.    Details: Hemoccult positive.  Normal sodium 135, normal potassium 3.9, normal creatinine 0.76, normal LFTs.  White count normal 7, hemoglobin very low 6.3, normal platelet count  Discussion of management or test interpretation  with external provider(s): Dr. Loreta Ave was consulted via secure chat   Risk Decision regarding hospitalization. 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  Rx / DC Orders ED Discharge Orders     None         Zarah Carbon, MD 03/17/23 8413

## 2023-03-18 ENCOUNTER — Other Ambulatory Visit: Payer: Self-pay | Admitting: Hematology and Oncology

## 2023-03-18 DIAGNOSIS — D500 Iron deficiency anemia secondary to blood loss (chronic): Secondary | ICD-10-CM | POA: Diagnosis not present

## 2023-03-18 DIAGNOSIS — D649 Anemia, unspecified: Secondary | ICD-10-CM | POA: Diagnosis not present

## 2023-03-18 DIAGNOSIS — D62 Acute posthemorrhagic anemia: Secondary | ICD-10-CM | POA: Diagnosis not present

## 2023-03-18 DIAGNOSIS — D509 Iron deficiency anemia, unspecified: Secondary | ICD-10-CM | POA: Diagnosis not present

## 2023-03-18 DIAGNOSIS — R5383 Other fatigue: Secondary | ICD-10-CM | POA: Diagnosis not present

## 2023-03-18 DIAGNOSIS — Z6841 Body Mass Index (BMI) 40.0 and over, adult: Secondary | ICD-10-CM | POA: Diagnosis not present

## 2023-03-18 DIAGNOSIS — Z794 Long term (current) use of insulin: Secondary | ICD-10-CM | POA: Diagnosis not present

## 2023-03-18 DIAGNOSIS — Z79899 Other long term (current) drug therapy: Secondary | ICD-10-CM | POA: Diagnosis not present

## 2023-03-18 DIAGNOSIS — R195 Other fecal abnormalities: Secondary | ICD-10-CM | POA: Diagnosis not present

## 2023-03-18 DIAGNOSIS — I10 Essential (primary) hypertension: Secondary | ICD-10-CM | POA: Diagnosis not present

## 2023-03-18 DIAGNOSIS — E1169 Type 2 diabetes mellitus with other specified complication: Secondary | ICD-10-CM | POA: Diagnosis not present

## 2023-03-18 DIAGNOSIS — K922 Gastrointestinal hemorrhage, unspecified: Secondary | ICD-10-CM | POA: Diagnosis not present

## 2023-03-18 DIAGNOSIS — Z91199 Patient's noncompliance with other medical treatment and regimen due to unspecified reason: Secondary | ICD-10-CM | POA: Diagnosis not present

## 2023-03-18 LAB — CBC
HCT: 23.6 % — ABNORMAL LOW (ref 36.0–46.0)
Hemoglobin: 5.8 g/dL — CL (ref 12.0–15.0)
MCH: 15.3 pg — ABNORMAL LOW (ref 26.0–34.0)
MCHC: 24.6 g/dL — ABNORMAL LOW (ref 30.0–36.0)
MCV: 62.4 fL — ABNORMAL LOW (ref 80.0–100.0)
Platelets: 260 10*3/uL (ref 150–400)
RBC: 3.78 MIL/uL — ABNORMAL LOW (ref 3.87–5.11)
RDW: 19.2 % — ABNORMAL HIGH (ref 11.5–15.5)
WBC: 5.9 10*3/uL (ref 4.0–10.5)
nRBC: 0 % (ref 0.0–0.2)

## 2023-03-18 LAB — BASIC METABOLIC PANEL
Anion gap: 6 (ref 5–15)
BUN: 9 mg/dL (ref 8–23)
CO2: 22 mmol/L (ref 22–32)
Calcium: 8.7 mg/dL — ABNORMAL LOW (ref 8.9–10.3)
Chloride: 108 mmol/L (ref 98–111)
Creatinine, Ser: 0.65 mg/dL (ref 0.44–1.00)
GFR, Estimated: >60 mL/min (ref >60)
Glucose, Bld: 89 mg/dL (ref 70–99)
Potassium: 3.5 mmol/L (ref 3.5–5.1)
Sodium: 136 mmol/L (ref 135–145)

## 2023-03-18 LAB — VITAMIN D 25 HYDROXY (VIT D DEFICIENCY, FRACTURES): Vit D, 25-Hydroxy: 51.36 ng/mL (ref 30–100)

## 2023-03-18 LAB — GLUCOSE, CAPILLARY
Glucose-Capillary: 86 mg/dL (ref 70–99)
Glucose-Capillary: 62 mg/dL — ABNORMAL LOW (ref 70–99)
Glucose-Capillary: 98 mg/dL (ref 70–99)

## 2023-03-18 LAB — ERYTHROPOIETIN: Erythropoietin: 719.4 m[IU]/mL — ABNORMAL HIGH (ref 2.6–18.5)

## 2023-03-18 MED ORDER — PANTOPRAZOLE SODIUM 40 MG PO TBEC: 40.0000 mg | DELAYED_RELEASE_TABLET | Freq: Every day | ORAL | 1 refills | Status: DC

## 2023-03-18 MED ORDER — VITAMIN B-1 100 MG PO TABS: 100.0000 mg | ORAL_TABLET | Freq: Every day | ORAL | 1 refills | Status: AC

## 2023-03-18 MED ORDER — IRON SUCROSE 500 MG IVPB - SIMPLE MED
500.0000 mg | INTRAVENOUS | Status: DC
  Administered 2023-03-18: 500 mg via INTRAVENOUS
  Filled 2023-03-18: qty 500

## 2023-03-18 MED ORDER — ADULT MULTIVITAMIN W/MINERALS CH: 1.0000 | ORAL_TABLET | Freq: Every day | ORAL | Status: AC

## 2023-03-18 MED ORDER — SENNA 8.6 MG PO TABS: 1.0000 | ORAL_TABLET | Freq: Two times a day (BID) | ORAL | 1 refills | Status: AC

## 2023-03-18 MED ORDER — POLYETHYLENE GLYCOL 3350 17 G PO PACK: 17.0000 g | PACK | Freq: Every day | ORAL | 0 refills | Status: DC | PRN

## 2023-03-18 NOTE — Discharge Instructions (Signed)

## 2023-03-18 NOTE — TOC Transition Note (Signed)
Transition of Care [facility]) - CM/SW Discharge Note   Patient Details  Name: Valerie Carter MRN: [id number] Date of Birth: 08-Mar-1961  [facility] of [facility] CM/SW Contact:  Amada Jupiter, LCSW Phone Number: 03/18/2023, 9:24 AM   Clinical Narrative:     Received TOC referral to address SA concerns.  Met with pt and explained reason for referral - concern of ETOH intake - she quickly states, "I do not have a problem".  She declines any resource information and very firmly states she does not have any SA concerns.  TOC signing off.  Final next level of care: Home/Self Care Barriers to Discharge: No Barriers Identified   Patient Goals and CMS Choice      Discharge Placement                         Discharge Plan and Services Additional resources added to the After Visit Summary for                  DME Arranged: N/A DME Agency: NA                  Social Determinants of Health (SDOH) Interventions SDOH Screenings   Food Insecurity: No Food Insecurity (03/17/2023)  Housing: Low Risk  (03/17/2023)  Transportation Needs: No Transportation Needs (03/17/2023)  Utilities: Not At Risk (03/17/2023)  Alcohol Screen: Low Risk  (10/18/2022)  Depression (PHQ2-9): Medium Risk (10/18/2022)  Financial Resource Strain: Low Risk  (10/18/2022)  Physical Activity: Unknown (10/18/2022)  Social Connections: Unknown (10/18/2022)  Stress: Stress Concern Present (10/18/2022)  Tobacco Use: Low Risk  (11/26/2022)     Readmission Risk Interventions     No data to display

## 2023-03-18 NOTE — Discharge Summary (Signed)
Physician Discharge Summary  Valerie Carter ZOX:[id number] DOB: 10/23/1960  PCP: Noni Saupe, MD  Admitted from: Home Discharged to: Home  Admit date: 03/16/2023 Discharge date: 03/18/2023  Recommendations for Outpatient Follow-up:    Follow-up Information     Noni Saupe, MD. Schedule an appointment as soon as possible for a visit in 1 week(s).   Specialty: Family Medicine Why: To be seen with repeat labs (CBC & BMP). Contact information: [address] [place] [address] [phone]         Artis Delay, MD Follow up.   Specialty: Hematology and Oncology Why: MD will arrange follow-up to see her in the office in approximately 2 weeks. Contact information: [address] [place] [address] [phone]         Jeani Hawking, MD. Schedule an appointment as soon as possible for a visit.   Specialty: Gastroenterology Contact information: [address] [place] [address] [phone]                  Home Health: None    Equipment/Devices: None    Discharge Condition: Improved and stable.   Code Status: Full Code Diet recommendation:  Discharge Diet Orders (From admission, onward)     Start     Ordered   03/18/23 0000  Diet - low sodium heart healthy        03/18/23 1448   03/18/23 0000  Diet Carb Modified        03/18/23 1448             Discharge Diagnoses:  Principal Problem:   Acute blood loss anemia Active Problems:   S/P laparoscopic sleeve gastrectomy   Hyperlipidemia LDL goal <70   Obesity   HYPERTENSION, BENIGN ESSENTIAL   Diabetes mellitus (HCC)   Iron deficiency anemia due to chronic blood loss   Symptomatic anemia   Brief Summary: Valerie Carter is a 62 year old married female, Jehovah's Witness (does not take blood transfusions), works as a Designer, industrial/product, independent, menopausal, medical history significant for s/p laparoscopic gastric  sleeve resection 2018 (lost to follow-up), noncompliant with recommended multivitamins thereafter, type II DM, HTN, HLD, morbid obesity, presented to the ED on 03/17/2023 after being directed by her PCP due to low blood counts.  She states that she established care with new PCP and had her first visit yesterday when screening labs were drawn and was later advised to come to the ED due to hemoglobin in the 6 g range.  She reports approximately 2 months history of progressive weakness, dyspnea and palpitations with minimal exertion such as even going from her house to her car that is parked outside.  She would have to sit down for a few minutes in the car prior to recovering.  She denies history of chest pain, dizziness, lightheadedness or passing out.  She reports no.  Since her gastric sleeve surgery.  However since starting [name] approximately 2 years ago, has noted spotting for a day or 2 after she takes the Boles shot.  No heavy menstrual bleeding.  She also reports intermittent low-volume bright red bleeding per rectum only after constipation.  Reportedly gets constipated about 3-4 times per month and uses MiraLAX with water regularly.  Denies dyspepsia, abdominal pain, nausea or vomiting.  Appetite is overall decreased since initiating [name].  Denies pica.  Reports intermittent nosebleeds which she correlates to blood pressures being low after taking antihypertensives.  This seems like low  volume as well.  She had it every other day for about 2 weeks, approximately 2 weeks ago but none since.  States that she had a Cologuard done approximately a year ago which was negative but has never had a EGD or colonoscopy.  Patient is not on antiplatelets or anticoagulants.  Not compliant with most of her meds.   ED Course: Initially hypertensive to 192/106 and blood pressures have since normalized.  Other vital signs stable.  Lab work significant for hemoglobin on arrival 6.3, now 5.6.  MCV 63.  Anemia panel  significant for iron 12, saturation ratio 3, ferritin 1.  Folate 9.4, B12: 1654.  CMP unremarkable.  As per EDP DRE, normal colored stools, initial FOBT appeared to be negative but subsequent 1 was positive.  Assessment/Plan  Iron deficiency anemia/symptomatic anemia Could be multifactorial due to sleeve gastrectomy and poor absorption, slow GI blood loss versus other etiologies. GI consulted for consideration for EGD and colonoscopy.  Dr. Elnoria Howard saw her on 9/19 and indicated that her hemoglobin was too low to perform an EGD safely.  Even though she would benefit from an EGD, anesthesia will not approve of performing an endoscopy procedure.  He feels that she may have an esophagitis.  He stated that sleeve gastrectomy is prone to esophagitis and that she started having problems after the sleeve.  However she learned to avoid eating before laying down.  With this maneuver she managed her GERD symptoms.  [name] also causes gastroparesis but she has not reported worsening of GERD symptoms.  He recommended PPI every day. She received Venofer 100 mg IV yesterday followed by 500 mg IV today. No blood transfusions due to patient's preference/religious beliefs.  Discussed in detail with patient, spouse and patient's sister at bedside regarding risks and benefits of blood transfusion including dangerous life-threatening problems that could arise if her hemoglobin drops further and significantly.  They verbalized understanding. Hemoglobin remained stable 5.6> 5.8. Hematology was consulted and as per Dr. 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  She indicated that it takes 7 to 10 days for effective erythropoiesis and does not anticipate her blood count to improve before that.  She also anticipates potential other nutritional deficiencies such as thiamine and copper deficiencies-labs have been sent and pending and she will address them during outpatient  follow-up.  Erythropoietin 719.4 > 500, indicates that she does not qualify for or need erythropoietin stimulating agent. I discussed with her and she recommended against oral iron supplements and will arrange follow-up in the office in 2 weeks with repeat labs and ongoing IV iron transfusions.  She also indicated that patient should be off work for approximately 2 weeks until follow-up (works as a Designer, industrial/product and would not want her to have syncope or near syncope.  I have also advised patient in the presence of her spouse that she should not drive any other motor vehicles either until she sees the hematologist and they verbalized understanding).   Type II DM in obese Missed [name] for a week.  Held while hospitalized SSI while here and titrate as needed.  CBGs were tightly controlled.  Had a hypoglycemic range CBG of 62 which should be verified to normal prior to discharge. A1c 5.5 on 9/19 indicates tight control.  Defer to PCP regarding adjustment of dose of [name].   Essential hypertension Presented with slight hypertension which has since improved. Noncompliant with antihypertensives at home. Recommend continuing scheduled lisinopril at bedtime rather  than as needed.  Reportedly does not take metoprolol any more.   Hyperlipidemia Noncompliant with statins.  Resume at discharge.   Alcohol use disorder Patient volunteers to less than what she actually consumes according to spouse at bedside who feels that she drinks anywhere between 3-5 beers daily, 12 ounces each. Moderation and eventual cessation counseled. CIWA protocol.  No overt alcohol withdrawal.   Body mass index is 51.42 kg/m.  Morbid obesity Lifestyle modifications and outpatient follow-up.   S/p laparoscopic sleeve gastrectomy Lost to follow-up with surgeon.  Recommended making an appointment for recommendations   Medical noncompliance Extensively counseled patient and spouse at bedside.   Consultations: GI/Dr.  Eln[facility]/Dr. Bertis Ruddy  Procedures: None   Discharge Instructions  Discharge Instructions     Call MD for:  difficulty breathing, headache or visual disturbances   Complete by: As directed    Call MD for:  extreme fatigue   Complete by: As directed    Call MD for:  persistant dizziness or light-headedness   Complete by: As directed    Diet - low sodium heart healthy   Complete by: As directed    Diet Carb Modified   Complete by: As directed    Increase activity slowly   Complete by: As directed         Medication List     STOP taking these medications    cyclobenzaprine 10 MG tablet Commonly known as: FLEXERIL   DULoxetine 30 MG capsule Commonly known as: Cymbalta   gabapentin 100 MG capsule Commonly known as: Neurontin   HYDROcodone-acetaminophen 10-325 MG tablet Commonly known as: NORCO   metoprolol tartrate 25 MG tablet Commonly known as: LOPRESSOR   MOTRIN IB PO   Vitamin D (Ergocalciferol) 1.25 MG (50000 UNIT) Caps capsule Commonly known as: DRISDOL       TAKE these medications    Blood Pressure Kit Check your blood pressure 2-3 times a week   lisinopril 5 MG tablet Commonly known as: ZESTRIL TAKE 1 TABLET BY MOUTH AT BEDTIME   multivitamin with minerals Tabs tablet Take 1 tablet by mouth daily. Start taking on: March 19, 2023   pantoprazole 40 MG tablet Commonly known as: PROTONIX Take 1 tablet (40 mg total) by mouth daily. Start taking on: March 19, 2023   polyethylene glycol 17 g packet Commonly known as: MIRALAX / GLYCOLAX Take 17 g by mouth daily as needed for mild constipation.   rosuvastatin 20 MG tablet Commonly known as: Crestor Take 1 tablet (20 mg total) by mouth daily.   senna 8.6 MG Tabs tablet Commonly known as: SENOKOT Take 1 tablet (8.6 mg total) by mouth 2 (two) times daily.   thiamine 100 MG tablet Commonly known as: Vitamin B-1 Take 1 tablet (100 mg total) by mouth daily. Start taking  on: March 19, 2023   tirzepatide 10 MG/0.5ML Pen Commonly known as: [name] Inject 10 mg into the skin once a week.   TYLENOL ARTHRITIS PAIN PO Take 1 tablet by mouth every 12 (twelve) hours.       No Known Allergies    Procedures/Studies: No results found.    Subjective: Seen this morning along with spouse at bedside.  No new complaints.  Specifically denies dizziness, lightheadedness, feeling like passing out or passing out ever.  No chest pain, palpitations or dyspnea.  Has ambulated in the room and to the bathroom without dyspnea here.  Discharge Exam:  Vitals:   03/18/23 0981 03/18/23 1914 03/18/23 7829 03/18/23  1342  BP: (!) 152/82 (!) 180/111 (!) 140/90 (!) 156/94  Pulse: 81 83  83  Resp: 17 16  18   Temp: 98.2 F (36.8 C) 98.6 F (37 C)  98.5 F (36.9 C)  TempSrc: Oral   Oral  SpO2: 100% 100%  100%  Weight:      Height:        General: Middle-age female, moderately built and obese sitting up comfortably in bed without distress.  Mucosa pale but moist. Cardiovascular: S1 & S2 heard, RRR, S1/S2 +. No murmurs, rubs, gallops or clicks. No JVD or pedal edema. Respiratory: Clear to auscultation without wheezing, rhonchi or crackles. No increased work of breathing. Abdominal:  Non distended, non tender & soft. No organomegaly or masses appreciated. Normal bowel sounds heard. CNS: Alert and oriented. No focal deficits. Extremities: no edema, no cyanosis    The results of significant diagnostics from this hospitalization (including imaging, microbiology, ancillary and laboratory) are listed below for reference.     Microbiology: No results found for this or any previous visit (from the past 240 hour(s)).   Labs: CBC: Recent Labs  Lab 03/16/23 2012 03/17/23 0446 03/18/23 0354  WBC 7.0 7.4 5.9  NEUTROABS  --  3.8  --   HGB 6.3* 5.6* 5.8*  HCT 25.4* 22.5* 23.6*  MCV 63.0* 63.0* 62.4*  PLT 285 259 260    Basic Metabolic Panel: Recent Labs  Lab  03/16/23 2012 03/17/23 0446 03/18/23 0354  NA 135 137 136  K 3.9 3.6 3.5  CL 105 110 108  CO2 21* 21* 22  GLUCOSE 88 92 89  BUN 17 14 9   CREATININE 0.76 0.43* 0.65  CALCIUM 8.8* 8.5* 8.7*  MG  --  1.8  --     Liver Function Tests: Recent Labs  Lab 03/16/23 2012 03/17/23 0446  AST 19 16  ALT 14 12  ALKPHOS 67 60  BILITOT 1.2 1.1  PROT 8.4* 7.6  ALBUMIN 3.4* 3.1*    CBG: Recent Labs  Lab 03/17/23 1003 03/17/23 1607 03/17/23 2139 03/18/23 0811 03/18/23 1138  GLUCAP 76 107* 100* 86 62*    Hgb A1c Recent Labs    03/17/23 0500  HGBA1C 5.5     Anemia work up Recent Labs    03/17/23 0240 03/17/23 0446  VITAMINB12 1,654*  --   FOLATE 9.4  --   FERRITIN 1*  --   TIBC 382  --   IRON 12*  --   RETICCTPCT  --  1.9    Discussed in detail with patient's spouse at bedside.  Updated care and answered all questions.  Time coordinating discharge: 25 minutes  SIGNED:  Marcellus Scott, MD,  FACP, [address] [facility], [facility], [facility]   [facility] [place]     To contact the attending provider between 7A-7P or the covering provider during after hours 7P-7A, please log into the web site [email or web address] and access using universal  password for that web site. If you do not have the password, please call the hospital operator.

## 2023-03-18 NOTE — Progress Notes (Signed)
Patient's Hgb is 5.8. Informed Anthoney Harada.

## 2023-03-18 NOTE — Progress Notes (Signed)
Valerie Carter   DOB:[birth year]   ZO#:[id number]    ASSESSMENT & PLAN:  Severe anemia This is likely due to severe malabsorption secondary to history of gastric sleeve resection.  She has received intravenous iron infusion.  The patient has declined blood transfusion due to religious reasons Unfortunately, it takes 7 to 10 days for effective erythropoiesis so I do not anticipate her blood count will improve to a safe level prior to discharge I anticipate potential other nutritional deficiencies such as thiamine and copper deficiencies, results are pending EPO level is high and hence the patient does not qualify or need erythropoietin stimulating agent I agree with plan for one more dose of intravenous iron today prior to discharge   I will set up outpatient follow-up with further intravenous iron infusion in the outpatient clinic She would benefit from outpatient GI evaluation to rule out GI blood loss   History of vitamin D deficiencies Vitamin D level is pending The patient is advised to avoid NSAID   Discharge planning She can be discharged today.  I will set up outpatient follow-up in 2 weeks    All questions were answered. The patient knows to call the clinic with any problems, questions or concerns.   The total time spent in the appointment was 40 minutes encounter with patients including review of chart and various tests results, discussions about plan of care and coordination of care plan  Artis Delay, MD 03/18/2023 7:15 AM  Subjective:  Patient is seen this morning.  She feels about the same.  We discussed results of her blood work and future follow-up  Objective:  Vitals:   03/18/23 0136 03/18/23 0445  BP: (!) 136/59 (!) 152/82  Pulse: 95 81  Resp: 16 17  Temp: 97.7 F (36.5 C) 98.2 F (36.8 C)  SpO2: 100% 100%    No intake or output data in the 24 hours ending 03/18/23 0715  GENERAL:alert, no distress and comfortable    Labs:  Recent Labs    03/16/23 2012  03/17/23 0446 03/18/23 0354  NA 135 137 136  K 3.9 3.6 3.5  CL 105 110 108  CO2 21* 21* 22  GLUCOSE 88 92 89  BUN 17 14 9   CREATININE 0.76 0.43* 0.65  CALCIUM 8.8* 8.5* 8.7*  GFRNONAA >60 >60 >60  PROT 8.4* 7.6  --   ALBUMIN 3.4* 3.1*  --   AST 19 16  --   ALT 14 12  --   ALKPHOS 67 60  --   BILITOT 1.2 1.1  --     Studies:  No results found.

## 2023-03-21 ENCOUNTER — Other Ambulatory Visit (HOSPITAL_COMMUNITY): Payer: Self-pay

## 2023-03-21 ENCOUNTER — Telehealth (HOSPITAL_COMMUNITY): Payer: Self-pay | Admitting: Pharmacy Technician

## 2023-03-21 ENCOUNTER — Encounter: Payer: Self-pay | Admitting: Hematology and Oncology

## 2023-03-21 NOTE — Telephone Encounter (Signed)
Pharmacy Patient Advocate Encounter   Received notification from Fax that prior authorization for Pantoprazole Sodium 40MG  dr tablets is required/requested.   Insurance verification completed.   The patient is insured through [facility] .   Per test claim: PA required; PA submitted to [employer] via CoverMyMeds Key/confirmation #/EOC [facility] Status is pending

## 2023-03-21 NOTE — Telephone Encounter (Signed)
Pharmacy Patient Advocate Encounter  Received notification from [facility] that Prior Authorization for Pantoprazole Sodium 40MG  dr tablets  has been APPROVED from 03/21/2023 to 03/20/2024. Ran test claim, Copay is $2.26. This test claim was processed through [facility]- copay amounts may vary at other pharmacies due to pharmacy/plan contracts, or as the patient moves through the different stages of their insurance plan.   PA #/Case ID/Reference #: [id number]

## 2023-03-22 ENCOUNTER — Encounter: Payer: Self-pay | Admitting: Gastroenterology

## 2023-03-22 ENCOUNTER — Telehealth: Payer: Self-pay

## 2023-03-22 LAB — COPPER, SERUM: Copper: 135 ug/dL (ref 80–158)

## 2023-03-22 NOTE — Telephone Encounter (Signed)
Called patient to schedule Screening Colonoscopy. Patient stated that she had been in the hospital recently for a Hemoglobin of 5.8. Patient stated she will not do blood transfusion due to religious beliefs. Patient saw Dr.Hung in the hospital and preferred to follow up with him.

## 2023-03-23 LAB — VITAMIN B1: Vitamin B1 (Thiamine): 118.4 nmol/L (ref 66.5–200.0)

## 2023-03-28 DIAGNOSIS — E119 Type 2 diabetes mellitus without complications: Secondary | ICD-10-CM | POA: Diagnosis not present

## 2023-03-28 DIAGNOSIS — E7800 Pure hypercholesterolemia, unspecified: Secondary | ICD-10-CM | POA: Diagnosis not present

## 2023-03-28 DIAGNOSIS — D509 Iron deficiency anemia, unspecified: Secondary | ICD-10-CM | POA: Diagnosis not present

## 2023-03-29 DIAGNOSIS — E1169 Type 2 diabetes mellitus with other specified complication: Secondary | ICD-10-CM | POA: Diagnosis not present

## 2023-03-29 DIAGNOSIS — K5900 Constipation, unspecified: Secondary | ICD-10-CM | POA: Diagnosis not present

## 2023-03-29 DIAGNOSIS — I152 Hypertension secondary to endocrine disorders: Secondary | ICD-10-CM | POA: Diagnosis not present

## 2023-03-29 DIAGNOSIS — Z9189 Other specified personal risk factors, not elsewhere classified: Secondary | ICD-10-CM | POA: Diagnosis not present

## 2023-03-30 ENCOUNTER — Encounter: Payer: Self-pay | Admitting: Family Medicine

## 2023-03-30 DIAGNOSIS — D509 Iron deficiency anemia, unspecified: Secondary | ICD-10-CM | POA: Diagnosis not present

## 2023-04-01 ENCOUNTER — Encounter: Payer: Self-pay | Admitting: Hematology and Oncology

## 2023-04-01 ENCOUNTER — Inpatient Hospital Stay (HOSPITAL_BASED_OUTPATIENT_CLINIC_OR_DEPARTMENT_OTHER): Payer: BC Managed Care – PPO | Admitting: Hematology and Oncology

## 2023-04-01 ENCOUNTER — Inpatient Hospital Stay: Payer: BC Managed Care – PPO | Attending: Hematology and Oncology

## 2023-04-01 ENCOUNTER — Inpatient Hospital Stay: Payer: BC Managed Care – PPO

## 2023-04-01 VITALS — BP 123/75 | HR 89 | Temp 98.2°F | Resp 16

## 2023-04-01 VITALS — BP 156/63 | HR 97 | Temp 98.7°F | Resp 18 | Ht 65.0 in | Wt 294.2 lb

## 2023-04-01 DIAGNOSIS — R5381 Other malaise: Secondary | ICD-10-CM | POA: Insufficient documentation

## 2023-04-01 DIAGNOSIS — D509 Iron deficiency anemia, unspecified: Secondary | ICD-10-CM | POA: Diagnosis not present

## 2023-04-01 DIAGNOSIS — D500 Iron deficiency anemia secondary to blood loss (chronic): Secondary | ICD-10-CM

## 2023-04-01 DIAGNOSIS — Z9884 Bariatric surgery status: Secondary | ICD-10-CM | POA: Insufficient documentation

## 2023-04-01 LAB — CBC WITH DIFFERENTIAL/PLATELET
Abs Immature Granulocytes: 0.02 10*3/uL (ref 0.00–0.07)
Basophils Absolute: 0.1 10*3/uL (ref 0.0–0.1)
Basophils Relative: 1 %
Eosinophils Absolute: 0.3 10*3/uL (ref 0.0–0.5)
Eosinophils Relative: 5 %
HCT: 31.7 % — ABNORMAL LOW (ref 36.0–46.0)
Hemoglobin: 8.2 g/dL — ABNORMAL LOW (ref 12.0–15.0)
Immature Granulocytes: 0 %
Lymphocytes Relative: 22 %
Lymphs Abs: 1.4 10*3/uL (ref 0.7–4.0)
MCH: 17.2 pg — ABNORMAL LOW (ref 26.0–34.0)
MCHC: 25.9 g/dL — ABNORMAL LOW (ref 30.0–36.0)
MCV: 66.6 fL — ABNORMAL LOW (ref 80.0–100.0)
Monocytes Absolute: 0.7 10*3/uL (ref 0.1–1.0)
Monocytes Relative: 10 %
Neutro Abs: 4 10*3/uL (ref 1.7–7.7)
Neutrophils Relative %: 62 %
Platelets: 400 10*3/uL (ref 150–400)
RBC: 4.76 MIL/uL (ref 3.87–5.11)
RDW: 26.1 % — ABNORMAL HIGH (ref 11.5–15.5)
WBC: 6.4 10*3/uL (ref 4.0–10.5)
nRBC: 0 % (ref 0.0–0.2)

## 2023-04-01 LAB — FERRITIN: Ferritin: 10 ng/mL — ABNORMAL LOW (ref 11–307)

## 2023-04-01 MED ORDER — SODIUM CHLORIDE 0.9 % IV SOLN
510.0000 mg | Freq: Once | INTRAVENOUS | Status: AC
  Administered 2023-04-01: 510 mg via INTRAVENOUS
  Filled 2023-04-01: qty 17

## 2023-04-01 MED ORDER — SODIUM CHLORIDE 0.9 % IV SOLN: Freq: Once | INTRAVENOUS | Status: AC

## 2023-04-01 NOTE — Progress Notes (Signed)
[facility] OFFICE PROGRESS NOTE  Noni Saupe, MD  ASSESSMENT & PLAN:  Iron deficiency anemia due to chronic blood loss The most likely cause of her anemia is due to chronic blood loss/malabsorption syndrome. We discussed some of the risks, benefits, and alternatives of intravenous iron infusions. The patient is symptomatic from anemia and the iron level is critically low. She tolerated oral iron supplement poorly and desires to achieved higher levels of iron faster for adequate hematopoesis. Some of the side-effects to be expected including risks of infusion reactions, phlebitis, headaches, nausea and fatigue.  The patient is willing to proceed. Patient education material was dispensed.  Goal is to keep ferritin level greater than 50 and resolution of anemia I recommend minimum 2 doses of intravenous iron Feraheme I plan to see her again next month for further follow-up   S/P laparoscopic sleeve gastrectomy I doubt she has any meaningful absorption due to her gastric bypass surgery For now, I recommend the patient not to take oral iron supplement and we will wait until she completed intravenous iron infusion before trying oral supplement again  Physical debility Her hemoglobin is up to a safe range She can return back to work next week  No orders of the defined types were placed in this encounter.   The total time spent in the appointment was 30 minutes encounter with patients including review of chart and various tests results, discussions about plan of care and coordination of care plan   All questions were answered. The patient knows to call the clinic with any problems, questions or concerns. No barriers to learning was detected.    Artis Delay, MD 10/4/20241:12 PM  INTERVAL HISTORY: Valerie Carter 62 y.o. female returns for hospital follow-up She was last seen in the hospital approximately 2 weeks ago.  The patient had received intravenous iron  infusion. The patient denies any recent signs or symptoms of bleeding such as spontaneous epistaxis, hematuria or hematochezia. No recent fainting episode.  She is on medical leave due to severe anemia  SUMMARY OF HEMATOLOGIC HISTORY: See my detailed dictation from March 17, 2023 for further details.  The patient was originally seen in the hospital as a consult due to severe anemia  She was found to have abnormal CBC from recent blood work The patient have history of laparoscopic gastric sleeve resection in 2018. The patient was advised to take chronic vitamin supplementation but she stopped taking them approximately 3 years ago.  She had regained a lot of weight and then was placed on [name] for the last 2 years and she has lost 50 pounds. Most recently, she noted to have progressive symptoms of anemia with shortness of breath on minimal exertion, pre-syncopal episodes, and palpitations. She had not noticed any recent bleeding such as hematuria or hematochezia.  She had spontaneous epistaxis 2 weeks ago but that has resolved.  She denies abnormal postmenopausal bleeding She is not on antiplatelets agents.  She takes ibuprofen on a regular basis for chronic joint pain and back pain. She has not have colonoscopy done.  She has chronic constipation secondary to [facility] which she take MiraLAX on a regular basis. She never donated blood or received blood transfusion Due to religious reason, she has declined transfusion.  Her blood count has dropped since admission to 5.6 with low MCV.  Iron studies confirm severe iron deficiency anemia.  Serum vitamin B12 and folate were normal.  She does not have chronic kidney disease.  Family is wondering whether she would qualify for erythropoietin stimulating agents  I have reviewed the past medical history, past surgical history, social history and family history with the patient and they are unchanged from previous note.  ALLERGIES:  has No Known  Allergies.  MEDICATIONS:  Current Outpatient Medications  Medication Sig Dispense Refill   rosuvastatin (CRESTOR) 20 MG tablet Take 1 tablet (20 mg total) by mouth daily. (Patient not taking: Reported on 03/17/2023) 90 tablet 3   Acetaminophen (TYLENOL ARTHRITIS PAIN PO) Take 1 tablet by mouth every 12 (twelve) hours.     Blood Pressure KIT Check your blood pressure 2-3 times a week 1 kit 0   lisinopril (ZESTRIL) 5 MG tablet TAKE 1 TABLET BY MOUTH AT BEDTIME 30 tablet 3   Multiple Vitamin (MULTIVITAMIN WITH MINERALS) TABS tablet Take 1 tablet by mouth daily.     pantoprazole (PROTONIX) 40 MG tablet Take 1 tablet (40 mg total) by mouth daily. 30 tablet 1   polyethylene glycol (MIRALAX / GLYCOLAX) 17 g packet Take 17 g by mouth daily as needed for mild constipation. 30 each 0   senna (SENOKOT) 8.6 MG TABS tablet Take 1 tablet (8.6 mg total) by mouth 2 (two) times daily. 60 tablet 1   thiamine (VITAMIN B-1) 100 MG tablet Take 1 tablet (100 mg total) by mouth daily. 30 tablet 1   tirzepatide ([name]) 10 MG/0.5ML Pen Inject 10 mg into the skin once a week. 6 mL 0   No current facility-administered medications for this visit.     REVIEW OF SYSTEMS:   Constitutional: Denies fevers, chills or night sweats Eyes: Denies blurriness of vision Ears, nose, mouth, throat, and face: Denies mucositis or sore throat Respiratory: Denies cough, dyspnea or wheezes Cardiovascular: Denies palpitation, chest discomfort or lower extremity swelling Gastrointestinal:  Denies nausea, heartburn or change in bowel habits Skin: Denies abnormal skin rashes Lymphatics: Denies new lymphadenopathy or easy bruising Neurological:Denies numbness, tingling or new weaknesses Behavioral/Psych: Mood is stable, no new changes  All other systems were reviewed with the patient and are negative.  PHYSICAL EXAMINATION: ECOG PERFORMANCE STATUS: 1 - Symptomatic but completely ambulatory  Vitals:   04/01/23 1256  BP: (!) 156/63   Pulse: 97  Resp: 18  Temp: 98.7 F (37.1 C)  SpO2: 100%   Filed Weights   04/01/23 1256  Weight: 294 lb 3.2 oz (133.4 kg)    GENERAL:alert, no distress and comfortable LABORATORY DATA:  I have reviewed the data as listed     Component Value Date/Time   NA 136 03/18/2023 0354   NA 140 12/14/2021 1656   K 3.5 03/18/2023 0354   CL 108 03/18/2023 0354   CO2 22 03/18/2023 0354   GLUCOSE 89 03/18/2023 0354   BUN 9 03/18/2023 0354   BUN 14 12/14/2021 1656   CREATININE 0.65 03/18/2023 0354   CREATININE 1.03 07/23/2016 1108   CALCIUM 8.7 (L) 03/18/2023 0354   PROT 7.6 03/17/2023 0446   PROT 7.9 12/14/2021 1656   ALBUMIN 3.1 (L) 03/17/2023 0446   ALBUMIN 4.0 12/14/2021 1656   AST 16 03/17/2023 0446   ALT 12 03/17/2023 0446   ALKPHOS 60 03/17/2023 0446   BILITOT 1.1 03/17/2023 0446   BILITOT 0.7 12/14/2021 1656   GFRNONAA >60 03/18/2023 0354   GFRNONAA 61 07/23/2016 1108   GFRAA 113 05/15/2018 1123   GFRAA 71 07/23/2016 1108    No results found for: "SPEP", "UPEP"  Lab Results  Component Value Date  WBC 5.9 03/18/2023   NEUTROABS 3.8 03/17/2023   HGB 5.8 (LL) 03/18/2023   HCT 23.6 (L) 03/18/2023   MCV 62.4 (L) 03/18/2023   PLT 260 03/18/2023      Chemistry      Component Value Date/Time   NA 136 03/18/2023 0354   NA 140 12/14/2021 1656   K 3.5 03/18/2023 0354   CL 108 03/18/2023 0354   CO2 22 03/18/2023 0354   BUN 9 03/18/2023 0354   BUN 14 12/14/2021 1656   CREATININE 0.65 03/18/2023 0354   CREATININE 1.03 07/23/2016 1108      Component Value Date/Time   CALCIUM 8.7 (L) 03/18/2023 0354   ALKPHOS 60 03/17/2023 0446   AST 16 03/17/2023 0446   ALT 12 03/17/2023 0446   BILITOT 1.1 03/17/2023 0446   BILITOT 0.7 12/14/2021 1656

## 2023-04-01 NOTE — Assessment & Plan Note (Signed)
The most likely cause of her anemia is due to chronic blood loss/malabsorption syndrome. We discussed some of the risks, benefits, and alternatives of intravenous iron infusions. The patient is symptomatic from anemia and the iron level is critically low. She tolerated oral iron supplement poorly and desires to achieved higher levels of iron faster for adequate hematopoesis. Some of the side-effects to be expected including risks of infusion reactions, phlebitis, headaches, nausea and fatigue.  The patient is willing to proceed. Patient education material was dispensed.  Goal is to keep ferritin level greater than 50 and resolution of anemia I recommend minimum 2 doses of intravenous iron Feraheme I plan to see her again next month for further follow-up

## 2023-04-01 NOTE — Patient Instructions (Signed)
Iron Sucrose Injection What is this medication? IRON SUCROSE (EYE ern SOO krose) treats low levels of iron (iron deficiency anemia) in people with kidney disease. Iron is a mineral that plays an important role in making red blood cells, which carry oxygen from your lungs to the rest of your body. This medicine may be used for other purposes; ask your health care provider or pharmacist if you have questions. COMMON BRAND NAME(S): Venofer What should I tell my care team before I take this medication? They need to know if you have any of these conditions: Anemia not caused by low iron levels Heart disease High levels of iron in the blood Kidney disease Liver disease An unusual or allergic reaction to iron, other medications, foods, dyes, or preservatives Pregnant or trying to get pregnant Breastfeeding How should I use this medication? This medication is for infusion into a vein. It is given in a hospital or clinic setting. Talk to your care team about the use of this medication in children. While this medication may be prescribed for children as young as 2 years for selected conditions, precautions do apply. Overdosage: If you think you have taken too much of this medicine contact a poison control center or emergency room at once. NOTE: This medicine is only for you. Do not share this medicine with others. What if I miss a dose? Keep appointments for follow-up doses. It is important not to miss your dose. Call your care team if you are unable to keep an appointment. What may interact with this medication? Do not take this medication with any of the following: Deferoxamine Dimercaprol Other iron products This medication may also interact with the following: Chloramphenicol Deferasirox This list may not describe all possible interactions. Give your health care provider a list of all the medicines, herbs, non-prescription drugs, or dietary supplements you use. Also tell them if you smoke,  drink alcohol, or use illegal drugs. Some items may interact with your medicine. What should I watch for while using this medication? Visit your care team regularly. Tell your care team if your symptoms do not start to get better or if they get worse. You may need blood work done while you are taking this medication. You may need to follow a special diet. Talk to your care team. Foods that contain iron include: whole grains/cereals, dried fruits, beans, or peas, leafy green vegetables, and organ meats (liver, kidney). What side effects may I notice from receiving this medication? Side effects that you should report to your care team as soon as possible: Allergic reactions--skin rash, itching, hives, swelling of the face, lips, tongue, or throat Low blood pressure--dizziness, feeling faint or lightheaded, blurry vision Shortness of breath Side effects that usually do not require medical attention (report to your care team if they continue or are bothersome): Flushing Headache Joint pain Muscle pain Nausea Pain, redness, or irritation at injection site This list may not describe all possible side effects. Call your doctor for medical advice about side effects. You may report side effects to FDA at 1-800-FDA-1088. Where should I keep my medication? This medication is given in a hospital or clinic. It will not be stored at home. NOTE: This sheet is a summary. It may not cover all possible information. If you have questions about this medicine, talk to your doctor, pharmacist, or health care provider.  2024 Elsevier/Gold Standard (2022-11-19 00:00:00)

## 2023-04-01 NOTE — Assessment & Plan Note (Signed)
Her hemoglobin is up to a safe range She can return back to work next week

## 2023-04-01 NOTE — Assessment & Plan Note (Signed)
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

## 2023-04-08 ENCOUNTER — Ambulatory Visit: Payer: BC Managed Care – PPO

## 2023-04-09 ENCOUNTER — Inpatient Hospital Stay: Payer: BC Managed Care – PPO

## 2023-04-09 VITALS — BP 140/87 | HR 86 | Temp 98.2°F | Resp 18

## 2023-04-09 DIAGNOSIS — D509 Iron deficiency anemia, unspecified: Secondary | ICD-10-CM | POA: Diagnosis not present

## 2023-04-09 DIAGNOSIS — D500 Iron deficiency anemia secondary to blood loss (chronic): Secondary | ICD-10-CM

## 2023-04-09 DIAGNOSIS — Z9884 Bariatric surgery status: Secondary | ICD-10-CM | POA: Diagnosis not present

## 2023-04-09 MED ORDER — SODIUM CHLORIDE 0.9 % IV SOLN: Freq: Once | INTRAVENOUS | Status: AC

## 2023-04-09 MED ORDER — SODIUM CHLORIDE 0.9 % IV SOLN
510.0000 mg | Freq: Once | INTRAVENOUS | Status: AC
  Administered 2023-04-09: 510 mg via INTRAVENOUS
  Filled 2023-04-09: qty 510

## 2023-04-11 ENCOUNTER — Ambulatory Visit: Payer: BC Managed Care – PPO | Admitting: Gastroenterology

## 2023-04-12 ENCOUNTER — Ambulatory Visit: Payer: BC Managed Care – PPO

## 2023-04-18 ENCOUNTER — Encounter: Payer: Self-pay | Admitting: Hematology and Oncology

## 2023-04-21 ENCOUNTER — Ambulatory Visit: Payer: BC Managed Care – PPO | Admitting: Physical Therapy

## 2023-05-05 ENCOUNTER — Ambulatory Visit: Payer: BC Managed Care – PPO | Attending: Family Medicine | Admitting: Physical Therapy

## 2023-05-09 DIAGNOSIS — I152 Hypertension secondary to endocrine disorders: Secondary | ICD-10-CM | POA: Diagnosis not present

## 2023-05-09 DIAGNOSIS — M5442 Lumbago with sciatica, left side: Secondary | ICD-10-CM | POA: Diagnosis not present

## 2023-05-09 DIAGNOSIS — D500 Iron deficiency anemia secondary to blood loss (chronic): Secondary | ICD-10-CM | POA: Diagnosis not present

## 2023-05-09 DIAGNOSIS — Z1389 Encounter for screening for other disorder: Secondary | ICD-10-CM | POA: Diagnosis not present

## 2023-05-09 DIAGNOSIS — E1169 Type 2 diabetes mellitus with other specified complication: Secondary | ICD-10-CM | POA: Diagnosis not present

## 2023-05-17 ENCOUNTER — Encounter: Payer: Self-pay | Admitting: Hematology and Oncology

## 2023-05-17 ENCOUNTER — Inpatient Hospital Stay: Payer: BC Managed Care – PPO | Attending: Hematology and Oncology

## 2023-05-17 ENCOUNTER — Inpatient Hospital Stay: Payer: BC Managed Care – PPO | Admitting: Hematology and Oncology

## 2023-05-20 ENCOUNTER — Ambulatory Visit (INDEPENDENT_AMBULATORY_CARE_PROVIDER_SITE_OTHER): Payer: BC Managed Care – PPO | Admitting: Family Medicine

## 2023-05-20 VITALS — BP 138/81 | Ht 64.5 in | Wt 291.0 lb

## 2023-05-20 DIAGNOSIS — M170 Bilateral primary osteoarthritis of knee: Secondary | ICD-10-CM | POA: Diagnosis not present

## 2023-05-20 DIAGNOSIS — D62 Acute posthemorrhagic anemia: Secondary | ICD-10-CM

## 2023-05-20 DIAGNOSIS — E66813 Obesity, class 3: Secondary | ICD-10-CM | POA: Diagnosis not present

## 2023-05-20 DIAGNOSIS — Z6841 Body Mass Index (BMI) 40.0 and over, adult: Secondary | ICD-10-CM

## 2023-05-20 MED ORDER — METHYLPREDNISOLONE ACETATE 40 MG/ML IJ SUSP
40.0000 mg | Freq: Once | INTRAMUSCULAR | Status: AC
Start: 2023-05-20 — End: 2023-05-20
  Administered 2023-05-20: 40 mg via INTRA_ARTICULAR

## 2023-05-20 NOTE — Patient Instructions (Addendum)
You might consider a second opinion for a GI work up. Several of my patients have liked Dr. Stan Head  and Dr. Starleen Arms to see you!  Today you received an injection with corticosteroid. This injection is usually done in response to pain and inflammation. There is some "numbing" medicine also in the shot so the injected area may be numb and feel really good for the next couple of hours. The numbing medicine usually wears off in 2-3 hours though, and then your pain level will be right back where it was before the injection.   The actually benefit from the steroid injection is usually noticed in 2-7 days. You may actually experience a small (as in 10%) INCREASE in pain in the first 24 hours---that is common.   Things to watch out for that you should contact us or a health care provider urgently would include: 1. Unusual (as in more than 10%) increase in pain 2. New fever > 101.5 3. New swelling or redness of the injected area.  4. Streaking of red lines around the area injected.

## 2023-05-22 ENCOUNTER — Encounter: Payer: Self-pay | Admitting: Family Medicine

## 2023-05-22 NOTE — Assessment & Plan Note (Signed)
>>  ASSESSMENT AND PLAN FOR ARTHRITIS OF KNEE, DEGENERATIVE WRITTEN ON 05/22/2023 11:28 AM BY Flor Whitacre L, MD  She is unable to take oral pain meds other than acetaminophen . She has tried tramadol  in the past and said it gorked her totally out and she was found herself sleeping in a chair on her porch with her mouth open, snoring; this really frightened her and she would be very hesitant to try again. Opioids are not an option currently due to job restrictions.  Will try CSI on her  left knee today.  I will do intra-articular as well as a separate injection into the area of tenderness on lateral knee which I believe is likely a pseudo bursa formation over her large osteophyte previously seen on XRay. Could also consider genicular nerve block or ablation in future, especially if TKR continues to be in distant future.

## 2023-05-22 NOTE — Assessment & Plan Note (Addendum)
By her report she was close to 400 pounds at one point and she has now successfully lost down to 291. Overall successful loss of > 100 pounds. Congratulated and discussed. She is doing well on current weight management plan and tolerating Tirzepatide.

## 2023-05-22 NOTE — Assessment & Plan Note (Addendum)
She is unable to take oral pain meds other than acetaminophen. She has tried tramadol in the past and said it "gorked" her totally out and she was found herself sleeping in a chair on her porch with her mouth open, snoring; this really frightened her and she would be very hesitant to try again. Opioids are not an option currently due to job restrictions.  Will try CSI on her  left knee today.  I will do intra-articular as well as a separate injection into the area of tenderness on lateral knee which I believe is likely a pseudo bursa formation over her large osteophyte previously seen on XRay. Could also consider genicular nerve block or ablation in future, especially if TKR continues to be in distant future.

## 2023-05-22 NOTE — Progress Notes (Signed)
Valerie Carter - 62 y.o. female MRN [id number]  Date of birth: [birth year]    SUBJECTIVE:      Chief Complaint:/ HPI:   Worsening left knee pain. 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 She now has new symptom of area on left knee that is sharp painon extension. Very specific spot. Has not had this before.It is also tender to touch. Concerns her  OBESITY:  Has successfully lost pounds from her all time high and is actively engaged in weight management program now. Her current goal is 270 for consideration of TKR. She is determined, although feels pressure of slowness of remaining pounds.  Recent hospitalized for GI bleed and symptomatic anemia. She is having some issues with her current GI doctor and feels frustrated. Considering second opinion and wants to know if I have recommendations. She has had 4 iron infusions.  PERTINENT  PMH / PSH: I have reviewed the patient's medications, allergies, past medical and surgical history, smoking status.  Pertinent findings that relate to today's visit / issues include: Recent GI bleed S/p iron infusion x 4 Reviewed left knee x ray from 02/2021 with her. She has large psteophytes at lateral joint line, one on distal femue and one on proximal tibia (almost kissing).  OBJECTIVE: BP 138/81   Ht 5' 4.5" (1.638 m)   Wt 291 lb (132 kg)   BMI 49.18 kg/m   Physical Exam:  Vital signs are reviewed. WD NAD Walks with halting gait, cane. Has to rely on cane as accessory support to rise from a chair and even this is difficult. KNEES: Bilaterally external changes of chronic synovitis with boggy hypertrophied synovium on palpation. No erythema or unusual warmth. Lacks full extension on left by 20 degrees and 10 degrees on right. Bilateral crepitus under patella . Lateral joint line tenderness on both knees, much worse on  left.  - Additionally she has area over lateral joint  line in soft tissue about 1 cm proximally that is TTP and recreates her new pain symptoms. Palpation reveals bony feeling nodule in this area. Small, one half CM, not mobile.Deep in the soft tissue (?pseudo bursa sac vs osteophyte formation?)   PROCEDURE: INJECTIONS: Patient was given informed consent, signed copy in the chart. Appropriate time out was taken. Area prepped and draped in usual sterile fashion.   Intra-articular knee joint:  Ethyl chloride was  used for local anesthesia. A 21 gauge 1 1/2 inch needle was used.. 1 cc of methylprednisolone 40 mg/ml plus  3 cc of 1% lidocaine without epinephrine was injected into the left knee joint using a(n) medial anterior approach.   Psuedo bursa injection lateral left knee soft tissue:  .Ethyl chloride was  used for local anesthesia. A 21 gauge 1 1/2 inch needle was used.. 1 cc of methylprednisolone 40 mg/ml plus  1 cc of 1% lidocaine without epinephrine was injected into the area of mass/pseudo bursal lateral knee musculature using a(n) perpendicular approach.   The patient tolerated the procedures well. There were no complications. Post procedure instructions were given.   ASSESSMENT & PLAN:  See problem based charting & AVS for pt instructions. Obesity By her report she was close to 400 pounds at one point and she has now successfully lost down to 291. Overall successful loss of > 100 pounds. Congratulated and discussed. She is doing  well on current weight management plan and tolerating Tirzepatide.  Acute blood loss anemia Discussed options for second opinion of GI consultant. I agree with her that she definitely needs follow up and likely colonoscopy / EGD sooner rather than later as she has no known source of blood loss. She has responded to iron infusions. Encouraged her to aggressively follow up these issues, even as she feels she has hit a road block in her medical  care.  Arthritis of knee, degenerative She is unable to take oral pain meds other than acetaminophen. She has tried tramadol in the past and said it "gorked" her totally out and she was found herself sleeping in a chair on her porch with her mouth open, snoring; this really frightened her and she would be very hesitant to try again. Opioids are not an option currently due to job restrictions.  Will try CSI on her  left knee today.  I will do intra-articular as well as a separate injection into the area of tenderness on lateral knee which I believe is likely a pseudo bursa formation over her large osteophyte previously seen on XRay. Could also consider genicular nerve block or ablation in future, especially if TKR continues to be in distant future.

## 2023-05-22 NOTE — Assessment & Plan Note (Signed)
Discussed options for second opinion of GI consultant. I agree with her that she definitely needs follow up and likely colonoscopy / EGD sooner rather than later as she has no known source of blood loss. She has responded to iron infusions. Encouraged her to aggressively follow up these issues, even as she feels she has hit a road block in her medical care.

## 2023-06-07 ENCOUNTER — Inpatient Hospital Stay (HOSPITAL_BASED_OUTPATIENT_CLINIC_OR_DEPARTMENT_OTHER): Payer: BC Managed Care – PPO | Admitting: Hematology and Oncology

## 2023-06-07 ENCOUNTER — Inpatient Hospital Stay: Payer: BC Managed Care – PPO | Attending: Hematology and Oncology

## 2023-06-07 ENCOUNTER — Encounter: Payer: Self-pay | Admitting: Hematology and Oncology

## 2023-06-07 VITALS — BP 136/64 | HR 84 | Temp 97.5°F | Resp 18 | Ht 64.5 in | Wt 292.8 lb

## 2023-06-07 DIAGNOSIS — D500 Iron deficiency anemia secondary to blood loss (chronic): Secondary | ICD-10-CM

## 2023-06-07 LAB — CBC WITH DIFFERENTIAL/PLATELET
Abs Immature Granulocytes: 0.02 10*3/uL (ref 0.00–0.07)
Basophils Absolute: 0 10*3/uL (ref 0.0–0.1)
Basophils Relative: 1 %
Eosinophils Absolute: 0.2 10*3/uL (ref 0.0–0.5)
Eosinophils Relative: 3 %
HCT: 35.1 % — ABNORMAL LOW (ref 36.0–46.0)
Hemoglobin: 10.8 g/dL — ABNORMAL LOW (ref 12.0–15.0)
Immature Granulocytes: 0 %
Lymphocytes Relative: 27 %
Lymphs Abs: 2 10*3/uL (ref 0.7–4.0)
MCH: 24 pg — ABNORMAL LOW (ref 26.0–34.0)
MCHC: 30.8 g/dL (ref 30.0–36.0)
MCV: 78 fL — ABNORMAL LOW (ref 80.0–100.0)
Monocytes Absolute: 0.6 10*3/uL (ref 0.1–1.0)
Monocytes Relative: 8 %
Neutro Abs: 4.4 10*3/uL (ref 1.7–7.7)
Neutrophils Relative %: 61 %
Platelets: 313 10*3/uL (ref 150–400)
RBC: 4.5 MIL/uL (ref 3.87–5.11)
RDW: 18.6 % — ABNORMAL HIGH (ref 11.5–15.5)
WBC: 7.2 10*3/uL (ref 4.0–10.5)
nRBC: 0 % (ref 0.0–0.2)

## 2023-06-07 LAB — FERRITIN: Ferritin: 4 ng/mL — ABNORMAL LOW (ref 11–307)

## 2023-06-07 NOTE — Progress Notes (Signed)
[facility] OFFICE PROGRESS NOTE  Noni Saupe, MD  ASSESSMENT & PLAN:  Iron deficiency anemia due to chronic blood loss She has dramatic progression of her hemoglobin with recent intravenous iron infusion but she has persistent anemia I recommend 2 more doses of intravenous iron and plan to see her back in 3 months for further follow-up She will continue her oral vitamin supplements  No orders of the defined types were placed in this encounter.   The total time spent in the appointment was 25 minutes encounter with patients including review of chart and various tests results, discussions about plan of care and coordination of care plan   All questions were answered. The patient knows to call the clinic with any problems, questions or concerns. No barriers to learning was detected.    Artis Delay, MD 12/10/202411:23 AM  INTERVAL HISTORY: Valerie Carter 62 y.o. 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  The patient was originally seen in the hospital as a consult due to severe anemia  She was found to have abnormal CBC from recent blood work The patient have history of laparoscopic gastric sleeve resection in 2018. The patient was advised to take chronic vitamin supplementation but she stopped taking them approximately 3 years ago.  She had regained a lot of weight and then was placed on [name] for the last 2 years and she has lost 50 pounds. Most recently, she noted to have progressive symptoms of anemia with shortness of breath on minimal exertion, pre-syncopal episodes, and palpitations. She had not noticed any recent bleeding  such as hematuria or hematochezia.  She had spontaneous epistaxis 2 weeks ago but that has resolved.  She denies abnormal postmenopausal bleeding She is not on antiplatelets agents.  She takes ibuprofen on a regular basis for chronic joint pain and back pain. She has not have colonoscopy done.  She has chronic constipation secondary to [facility] which she take MiraLAX on a regular basis. She never donated blood or received blood transfusion Due to religious reason, she has declined transfusion.  Her blood count has dropped since admission to 5.6 with low MCV.  Iron studies confirm severe iron deficiency anemia.  Serum vitamin B12 and folate were normal.  She does not have chronic kidney disease.  Family is wondering whether she would qualify for erythropoietin stimulating agents She received 2 doses of intravenous iron in October 2024  I have reviewed the past medical history, past surgical history, social history and family history with the patient and they are unchanged from previous note.  ALLERGIES:  has No Known Allergies.  MEDICATIONS:  Current Outpatient Medications  Medication Sig Dispense Refill   Acetaminophen (TYLENOL ARTHRITIS PAIN PO) Take 1 tablet by mouth every 12 (twelve) hours.     Blood Pressure KIT Check your blood pressure 2-3 times a week 1 kit 0   lisinopril (ZESTRIL) 5 MG tablet TAKE 1 TABLET BY MOUTH AT BEDTIME 30 tablet 3   Multiple Vitamin (MULTIVITAMIN WITH MINERALS) TABS tablet Take 1 tablet by mouth daily.     polyethylene glycol (MIRALAX / GLYCOLAX) 17 g packet Take 17 g by mouth daily as needed for mild constipation. 30  each 0   senna (SENOKOT) 8.6 MG TABS tablet Take 1 tablet (8.6 mg total) by mouth 2 (two) times daily. 60 tablet 1   thiamine (VITAMIN B-1) 100 MG tablet Take 1 tablet (100 mg total) by mouth daily. 30 tablet 1   tirzepatide ([name]) 10 MG/0.5ML Pen Inject 10 mg into the skin once a week. 6 mL 0   tiZANidine (ZANAFLEX) 4 MG tablet Take 4 mg by  mouth at bedtime as needed.     No current facility-administered medications for this visit.     REVIEW OF SYSTEMS:   Constitutional: Denies fevers, chills or night sweats Eyes: Denies blurriness of vision Ears, nose, mouth, throat, and face: Denies mucositis or sore throat Respiratory: Denies cough, dyspnea or wheezes Cardiovascular: Denies palpitation, chest discomfort or lower extremity swelling Gastrointestinal:  Denies nausea, heartburn or change in bowel habits Skin: Denies abnormal skin rashes Lymphatics: Denies new lymphadenopathy or easy bruising Neurological:Denies numbness, tingling or new weaknesses Behavioral/Psych: Mood is stable, no new changes  All other systems were reviewed with the patient and are negative.  PHYSICAL EXAMINATION: ECOG PERFORMANCE STATUS: 0 - Asymptomatic  Vitals:   06/07/23 1105  BP: 136/64  Pulse: 84  Resp: 18  Temp: (!) 97.5 F (36.4 C)  SpO2: 99%   Filed Weights   06/07/23 1105  Weight: 292 lb 12.8 oz (132.8 kg)    GENERAL:alert, no distress and comfortable   LABORATORY DATA:  I have reviewed the data as listed     Component Value Date/Time   NA 136 03/18/2023 0354   NA 140 12/14/2021 1656   K 3.5 03/18/2023 0354   CL 108 03/18/2023 0354   CO2 22 03/18/2023 0354   GLUCOSE 89 03/18/2023 0354   BUN 9 03/18/2023 0354   BUN 14 12/14/2021 1656   CREATININE 0.65 03/18/2023 0354   CREATININE 1.03 07/23/2016 1108   CALCIUM 8.7 (L) 03/18/2023 0354   PROT 7.6 03/17/2023 0446   PROT 7.9 12/14/2021 1656   ALBUMIN 3.1 (L) 03/17/2023 0446   ALBUMIN 4.0 12/14/2021 1656   AST 16 03/17/2023 0446   ALT 12 03/17/2023 0446   ALKPHOS 60 03/17/2023 0446   BILITOT 1.1 03/17/2023 0446   BILITOT 0.7 12/14/2021 1656   GFRNONAA >60 03/18/2023 0354   GFRNONAA 61 07/23/2016 1108   GFRAA 113 05/15/2018 1123   GFRAA 71 07/23/2016 1108    No results found for: "SPEP", "UPEP"  Lab Results  Component Value Date   WBC 7.2 06/07/2023    NEUTROABS 4.4 06/07/2023   HGB 10.8 (L) 06/07/2023   HCT 35.1 (L) 06/07/2023   MCV 78.0 (L) 06/07/2023   PLT 313 06/07/2023      Chemistry      Component Value Date/Time   NA 136 03/18/2023 0354   NA 140 12/14/2021 1656   K 3.5 03/18/2023 0354   CL 108 03/18/2023 0354   CO2 22 03/18/2023 0354   BUN 9 03/18/2023 0354   BUN 14 12/14/2021 1656   CREATININE 0.65 03/18/2023 0354   CREATININE 1.03 07/23/2016 1108      Component Value Date/Time   CALCIUM 8.7 (L) 03/18/2023 0354   ALKPHOS 60 03/17/2023 0446   AST 16 03/17/2023 0446   ALT 12 03/17/2023 0446   BILITOT 1.1 03/17/2023 0446   BILITOT 0.7 12/14/2021 1656

## 2023-06-07 NOTE — Assessment & Plan Note (Signed)
She has dramatic progression of her hemoglobin with recent intravenous iron infusion but she has persistent anemia I recommend 2 more doses of intravenous iron and plan to see her back in 3 months for further follow-up She will continue her oral vitamin supplements

## 2023-06-25 ENCOUNTER — Inpatient Hospital Stay: Payer: BC Managed Care – PPO

## 2023-06-25 VITALS — BP 97/72 | HR 81 | Temp 97.8°F | Resp 18

## 2023-06-25 DIAGNOSIS — D500 Iron deficiency anemia secondary to blood loss (chronic): Secondary | ICD-10-CM

## 2023-06-25 MED ORDER — SODIUM CHLORIDE 0.9 % IV SOLN
510.0000 mg | Freq: Once | INTRAVENOUS | Status: AC
  Administered 2023-06-25: 510 mg via INTRAVENOUS
  Filled 2023-06-25: qty 510

## 2023-07-02 ENCOUNTER — Inpatient Hospital Stay: Payer: BC Managed Care – PPO | Attending: Hematology and Oncology

## 2023-07-02 VITALS — BP 139/90 | HR 74 | Temp 97.9°F | Resp 16 | Ht 64.5 in

## 2023-07-02 DIAGNOSIS — D500 Iron deficiency anemia secondary to blood loss (chronic): Secondary | ICD-10-CM | POA: Diagnosis not present

## 2023-07-02 MED ORDER — SODIUM CHLORIDE 0.9 % IV SOLN
510.0000 mg | Freq: Once | INTRAVENOUS | Status: AC
  Administered 2023-07-02: 510 mg via INTRAVENOUS
  Filled 2023-07-02: qty 510

## 2023-07-02 NOTE — Patient Instructions (Signed)
 Ferumoxytol Injection What is this medication? FERUMOXYTOL (FER ue MOX i tol) treats low levels of iron in your body (iron deficiency anemia). Iron is a mineral that plays an important role in making red blood cells, which carry oxygen from your lungs to the rest of your body. This medicine may be used for other purposes; ask your health care provider or pharmacist if you have questions. COMMON BRAND NAME(S): Feraheme What should I tell my care team before I take this medication? They need to know if you have any of these conditions: Anemia not caused by low iron levels High levels of iron in the blood Magnetic resonance imaging (MRI) test scheduled An unusual or allergic reaction to iron, other medications, foods, dyes, or preservatives Pregnant or trying to get pregnant Breastfeeding How should I use this medication? This medication is injected into a vein. It is given by your care team in a hospital or clinic setting. Talk to your care team the use of this medication in children. Special care may be needed. Overdosage: If you think you have taken too much of this medicine contact a poison control center or emergency room at once. NOTE: This medicine is only for you. Do not share this medicine with others. What if I miss a dose? It is important not to miss your dose. Call your care team if you are unable to keep an appointment. What may interact with this medication? Other iron products This list may not describe all possible interactions. Give your health care provider a list of all the medicines, herbs, non-prescription drugs, or dietary supplements you use. Also tell them if you smoke, drink alcohol, or use illegal drugs. Some items may interact with your medicine. What should I watch for while using this medication? Visit your care team regularly. Tell your care team if your symptoms do not start to get better or if they get worse. You may need blood work done while you are taking this  medication. You may need to follow a special diet. Talk to your care team. Foods that contain iron include: whole grains/cereals, dried fruits, beans, or peas, leafy green vegetables, and organ meats (liver, kidney). What side effects may I notice from receiving this medication? Side effects that you should report to your care team as soon as possible: Allergic reactions--skin rash, itching, hives, swelling of the face, lips, tongue, or throat Low blood pressure--dizziness, feeling faint or lightheaded, blurry vision Shortness of breath Side effects that usually do not require medical attention (report to your care team if they continue or are bothersome): Flushing Headache Joint pain Muscle pain Nausea Pain, redness, or irritation at injection site This list may not describe all possible side effects. Call your doctor for medical advice about side effects. You may report side effects to FDA at 1-800-FDA-1088. Where should I keep my medication? This medication is given in a hospital or clinic. It will not be stored at home. NOTE: This sheet is a summary. It may not cover all possible information. If you have questions about this medicine, talk to your doctor, pharmacist, or health care provider.  2024 Elsevier/Gold Standard (2022-11-19 00:00:00)

## 2023-07-25 DIAGNOSIS — M25562 Pain in left knee: Secondary | ICD-10-CM | POA: Diagnosis not present

## 2023-07-25 DIAGNOSIS — D500 Iron deficiency anemia secondary to blood loss (chronic): Secondary | ICD-10-CM | POA: Diagnosis not present

## 2023-07-25 DIAGNOSIS — E1169 Type 2 diabetes mellitus with other specified complication: Secondary | ICD-10-CM | POA: Diagnosis not present

## 2023-09-08 ENCOUNTER — Encounter: Payer: Self-pay | Admitting: Hematology and Oncology

## 2023-09-08 ENCOUNTER — Inpatient Hospital Stay (HOSPITAL_BASED_OUTPATIENT_CLINIC_OR_DEPARTMENT_OTHER): Payer: BC Managed Care – PPO | Admitting: Hematology and Oncology

## 2023-09-08 ENCOUNTER — Inpatient Hospital Stay: Payer: BC Managed Care – PPO | Attending: Hematology and Oncology

## 2023-09-08 VITALS — BP 128/57 | HR 80 | Temp 97.4°F | Resp 18 | Ht 64.5 in | Wt 306.0 lb

## 2023-09-08 DIAGNOSIS — D500 Iron deficiency anemia secondary to blood loss (chronic): Secondary | ICD-10-CM

## 2023-09-08 DIAGNOSIS — M170 Bilateral primary osteoarthritis of knee: Secondary | ICD-10-CM | POA: Diagnosis not present

## 2023-09-08 DIAGNOSIS — K5909 Other constipation: Secondary | ICD-10-CM | POA: Insufficient documentation

## 2023-09-08 DIAGNOSIS — M25561 Pain in right knee: Secondary | ICD-10-CM

## 2023-09-08 DIAGNOSIS — E66813 Obesity, class 3: Secondary | ICD-10-CM | POA: Diagnosis not present

## 2023-09-08 DIAGNOSIS — M25562 Pain in left knee: Secondary | ICD-10-CM | POA: Diagnosis not present

## 2023-09-08 LAB — CBC WITH DIFFERENTIAL/PLATELET
Abs Immature Granulocytes: 0.01 10*3/uL (ref 0.00–0.07)
Basophils Absolute: 0 10*3/uL (ref 0.0–0.1)
Basophils Relative: 1 %
Eosinophils Absolute: 0.2 10*3/uL (ref 0.0–0.5)
Eosinophils Relative: 5 %
HCT: 39.4 % (ref 36.0–46.0)
Hemoglobin: 12.5 g/dL (ref 12.0–15.0)
Immature Granulocytes: 0 %
Lymphocytes Relative: 37 %
Lymphs Abs: 2 10*3/uL (ref 0.7–4.0)
MCH: 25.9 pg — ABNORMAL LOW (ref 26.0–34.0)
MCHC: 31.7 g/dL (ref 30.0–36.0)
MCV: 81.7 fL (ref 80.0–100.0)
Monocytes Absolute: 0.5 10*3/uL (ref 0.1–1.0)
Monocytes Relative: 10 %
Neutro Abs: 2.6 10*3/uL (ref 1.7–7.7)
Neutrophils Relative %: 47 %
Platelets: 238 10*3/uL (ref 150–400)
RBC: 4.82 MIL/uL (ref 3.87–5.11)
RDW: 15.4 % (ref 11.5–15.5)
WBC: 5.3 10*3/uL (ref 4.0–10.5)
nRBC: 0 % (ref 0.0–0.2)

## 2023-09-08 LAB — FERRITIN: Ferritin: 4 ng/mL — ABNORMAL LOW (ref 11–307)

## 2023-09-08 NOTE — Assessment & Plan Note (Addendum)
 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

## 2023-09-08 NOTE — Assessment & Plan Note (Addendum)
 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

## 2023-09-08 NOTE — Progress Notes (Signed)
   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   INTERVAL HISTORY: Patient returns for recurrent anemia Symptoms of anemia included fatigue We reviewed CBC result.  Ferritin level is pending The patient denies any recent signs or symptoms of bleeding such as spontaneous epistaxis, hematuria or hematochezia.   SUMMARY OF HEMATOLOGIC HISTORY:  See my detailed dictation from March 17, 2023 for further details.  The patient was originally seen in the hospital as a consult due to severe anemia  She was found to have abnormal CBC from recent blood work The patient have history of laparoscopic gastric sleeve resection in 2018. The patient was advised to take chronic vitamin supplementation but she stopped taking them approximately 3 years ago.  She had regained a lot of weight and then was placed on [name] for the last 2 years and she has lost 50 pounds. Most recently, she noted to have progressive symptoms of anemia with shortness of breath on minimal exertion, pre-syncopal episodes, and palpitations. She had not noticed any recent bleeding such as hematuria or hematochezia.  She had spontaneous epistaxis 2 weeks ago but that has  resolved.  She denies abnormal postmenopausal bleeding She is not on antiplatelets agents.  She takes ibuprofen on a regular basis for chronic joint pain and back pain. She has not have colonoscopy done.  She has chronic constipation secondary to [facility] which she take MiraLAX on a regular basis. She never donated blood or received blood transfusion Due to religious reason, she has declined transfusion.  Her blood count has dropped since admission to 5.6 with low MCV.  Iron studies confirm severe iron deficiency anemia.  Serum vitamin B12 and folate were normal.  She does not have chronic kidney disease.  Family is wondering whether she would qualify for erythropoietin stimulating agents She received 4 doses of intravenous iron in 2024  Vitals:   09/08/23 1058  BP: (!) 128/57  Pulse: 80  Resp: 18  Temp: (!) 97.4 F (36.3 C)  SpO2: 99%

## 2023-09-29 ENCOUNTER — Encounter: Payer: Self-pay | Admitting: Hematology and Oncology

## 2023-09-29 DIAGNOSIS — E1169 Type 2 diabetes mellitus with other specified complication: Secondary | ICD-10-CM | POA: Diagnosis not present

## 2023-09-29 DIAGNOSIS — G8929 Other chronic pain: Secondary | ICD-10-CM | POA: Diagnosis not present

## 2023-09-29 DIAGNOSIS — D500 Iron deficiency anemia secondary to blood loss (chronic): Secondary | ICD-10-CM | POA: Diagnosis not present

## 2023-09-29 DIAGNOSIS — I10 Essential (primary) hypertension: Secondary | ICD-10-CM | POA: Diagnosis not present

## 2023-10-13 DIAGNOSIS — G894 Chronic pain syndrome: Secondary | ICD-10-CM | POA: Diagnosis not present

## 2023-10-13 DIAGNOSIS — Z1330 Encounter for screening examination for mental health and behavioral disorders, unspecified: Secondary | ICD-10-CM | POA: Diagnosis not present

## 2023-10-13 DIAGNOSIS — M170 Bilateral primary osteoarthritis of knee: Secondary | ICD-10-CM | POA: Diagnosis not present

## 2023-10-24 ENCOUNTER — Other Ambulatory Visit: Payer: Self-pay | Admitting: Family Medicine

## 2023-10-24 DIAGNOSIS — D508 Other iron deficiency anemias: Secondary | ICD-10-CM | POA: Diagnosis not present

## 2023-10-24 DIAGNOSIS — R748 Abnormal levels of other serum enzymes: Secondary | ICD-10-CM | POA: Diagnosis not present

## 2023-10-24 DIAGNOSIS — N939 Abnormal uterine and vaginal bleeding, unspecified: Secondary | ICD-10-CM | POA: Diagnosis not present

## 2023-10-24 DIAGNOSIS — E1169 Type 2 diabetes mellitus with other specified complication: Secondary | ICD-10-CM | POA: Diagnosis not present

## 2023-10-28 ENCOUNTER — Ambulatory Visit
Admission: RE | Admit: 2023-10-28 | Discharge: 2023-10-28 | Disposition: A | Discharge status: Home / Self Care | Source: Ambulatory Visit | Attending: Family Medicine | Admitting: Family Medicine

## 2023-10-28 DIAGNOSIS — R945 Abnormal results of liver function studies: Secondary | ICD-10-CM | POA: Diagnosis not present

## 2023-10-28 DIAGNOSIS — R748 Abnormal levels of other serum enzymes: Secondary | ICD-10-CM

## 2023-10-31 ENCOUNTER — Other Ambulatory Visit: Payer: Self-pay

## 2023-10-31 ENCOUNTER — Encounter (HOSPITAL_COMMUNITY): Payer: Self-pay

## 2023-10-31 ENCOUNTER — Inpatient Hospital Stay (HOSPITAL_COMMUNITY)
Admission: EM | Admit: 2023-10-31 | Discharge: 2023-11-04 | DRG: 101 | Disposition: A | Discharge status: Home Health | Attending: Internal Medicine | Admitting: Internal Medicine

## 2023-10-31 ENCOUNTER — Observation Stay (HOSPITAL_COMMUNITY)

## 2023-10-31 ENCOUNTER — Emergency Department (HOSPITAL_COMMUNITY)

## 2023-10-31 DIAGNOSIS — R000 Tachycardia, unspecified: Secondary | ICD-10-CM | POA: Diagnosis not present

## 2023-10-31 DIAGNOSIS — R569 Unspecified convulsions: Principal | ICD-10-CM

## 2023-10-31 DIAGNOSIS — R0689 Other abnormalities of breathing: Secondary | ICD-10-CM | POA: Diagnosis not present

## 2023-10-31 DIAGNOSIS — K449 Diaphragmatic hernia without obstruction or gangrene: Secondary | ICD-10-CM | POA: Diagnosis not present

## 2023-10-31 DIAGNOSIS — Z8249 Family history of ischemic heart disease and other diseases of the circulatory system: Secondary | ICD-10-CM | POA: Diagnosis not present

## 2023-10-31 DIAGNOSIS — R9389 Abnormal findings on diagnostic imaging of other specified body structures: Secondary | ICD-10-CM | POA: Diagnosis present

## 2023-10-31 DIAGNOSIS — Z9884 Bariatric surgery status: Secondary | ICD-10-CM

## 2023-10-31 DIAGNOSIS — J0100 Acute maxillary sinusitis, unspecified: Secondary | ICD-10-CM | POA: Diagnosis not present

## 2023-10-31 DIAGNOSIS — E119 Type 2 diabetes mellitus without complications: Secondary | ICD-10-CM

## 2023-10-31 DIAGNOSIS — D509 Iron deficiency anemia, unspecified: Secondary | ICD-10-CM | POA: Diagnosis not present

## 2023-10-31 DIAGNOSIS — Z7985 Long-term (current) use of injectable non-insulin antidiabetic drugs: Secondary | ICD-10-CM

## 2023-10-31 DIAGNOSIS — E785 Hyperlipidemia, unspecified: Secondary | ICD-10-CM | POA: Diagnosis present

## 2023-10-31 DIAGNOSIS — A419 Sepsis, unspecified organism: Secondary | ICD-10-CM | POA: Diagnosis not present

## 2023-10-31 DIAGNOSIS — M4722 Other spondylosis with radiculopathy, cervical region: Secondary | ICD-10-CM | POA: Diagnosis not present

## 2023-10-31 DIAGNOSIS — R6510 Systemic inflammatory response syndrome (SIRS) of non-infectious origin without acute organ dysfunction: Secondary | ICD-10-CM | POA: Diagnosis not present

## 2023-10-31 DIAGNOSIS — J189 Pneumonia, unspecified organism: Secondary | ICD-10-CM | POA: Diagnosis not present

## 2023-10-31 DIAGNOSIS — E860 Dehydration: Secondary | ICD-10-CM | POA: Diagnosis not present

## 2023-10-31 DIAGNOSIS — Z8669 Personal history of other diseases of the nervous system and sense organs: Secondary | ICD-10-CM | POA: Diagnosis not present

## 2023-10-31 DIAGNOSIS — M170 Bilateral primary osteoarthritis of knee: Secondary | ICD-10-CM | POA: Diagnosis present

## 2023-10-31 DIAGNOSIS — M1711 Unilateral primary osteoarthritis, right knee: Secondary | ICD-10-CM | POA: Diagnosis not present

## 2023-10-31 DIAGNOSIS — R0902 Hypoxemia: Secondary | ICD-10-CM | POA: Diagnosis not present

## 2023-10-31 DIAGNOSIS — G8929 Other chronic pain: Secondary | ICD-10-CM | POA: Diagnosis present

## 2023-10-31 DIAGNOSIS — Z6838 Body mass index (BMI) 38.0-38.9, adult: Secondary | ICD-10-CM | POA: Diagnosis not present

## 2023-10-31 DIAGNOSIS — R001 Bradycardia, unspecified: Secondary | ICD-10-CM | POA: Diagnosis not present

## 2023-10-31 DIAGNOSIS — I4719 Other supraventricular tachycardia: Secondary | ICD-10-CM | POA: Diagnosis not present

## 2023-10-31 DIAGNOSIS — M5412 Radiculopathy, cervical region: Secondary | ICD-10-CM | POA: Diagnosis not present

## 2023-10-31 DIAGNOSIS — R1012 Left upper quadrant pain: Secondary | ICD-10-CM | POA: Diagnosis not present

## 2023-10-31 DIAGNOSIS — R5381 Other malaise: Secondary | ICD-10-CM | POA: Diagnosis not present

## 2023-10-31 DIAGNOSIS — Z79899 Other long term (current) drug therapy: Secondary | ICD-10-CM

## 2023-10-31 DIAGNOSIS — I2510 Atherosclerotic heart disease of native coronary artery without angina pectoris: Secondary | ICD-10-CM | POA: Diagnosis not present

## 2023-10-31 DIAGNOSIS — M85861 Other specified disorders of bone density and structure, right lower leg: Secondary | ICD-10-CM | POA: Diagnosis not present

## 2023-10-31 DIAGNOSIS — K429 Umbilical hernia without obstruction or gangrene: Secondary | ICD-10-CM | POA: Diagnosis not present

## 2023-10-31 DIAGNOSIS — I6782 Cerebral ischemia: Secondary | ICD-10-CM | POA: Diagnosis not present

## 2023-10-31 DIAGNOSIS — E66812 Obesity, class 2: Secondary | ICD-10-CM | POA: Diagnosis present

## 2023-10-31 DIAGNOSIS — R319 Hematuria, unspecified: Secondary | ICD-10-CM | POA: Diagnosis not present

## 2023-10-31 DIAGNOSIS — J9811 Atelectasis: Secondary | ICD-10-CM | POA: Diagnosis not present

## 2023-10-31 DIAGNOSIS — E876 Hypokalemia: Secondary | ICD-10-CM | POA: Diagnosis not present

## 2023-10-31 DIAGNOSIS — G40409 Other generalized epilepsy and epileptic syndromes, not intractable, without status epilepticus: Secondary | ICD-10-CM | POA: Diagnosis not present

## 2023-10-31 DIAGNOSIS — I10 Essential (primary) hypertension: Secondary | ICD-10-CM | POA: Diagnosis not present

## 2023-10-31 DIAGNOSIS — D500 Iron deficiency anemia secondary to blood loss (chronic): Secondary | ICD-10-CM | POA: Diagnosis not present

## 2023-10-31 DIAGNOSIS — I425 Other restrictive cardiomyopathy: Secondary | ICD-10-CM | POA: Diagnosis not present

## 2023-10-31 DIAGNOSIS — M25461 Effusion, right knee: Secondary | ICD-10-CM | POA: Diagnosis not present

## 2023-10-31 LAB — URINALYSIS, W/ REFLEX TO CULTURE (INFECTION SUSPECTED)
Bilirubin Urine: NEGATIVE
Glucose, UA: NEGATIVE mg/dL
Ketones, ur: 20 mg/dL — AB
Leukocytes,Ua: NEGATIVE
Nitrite: NEGATIVE
Protein, ur: NEGATIVE mg/dL
Specific Gravity, Urine: 1.01 (ref 1.005–1.030)
pH: 6 (ref 5.0–8.0)

## 2023-10-31 LAB — MAGNESIUM: Magnesium: 1.6 mg/dL — ABNORMAL LOW (ref 1.7–2.4)

## 2023-10-31 LAB — CBC WITH DIFFERENTIAL/PLATELET
Abs Immature Granulocytes: 0.08 10*3/uL — ABNORMAL HIGH (ref 0.00–0.07)
Basophils Absolute: 0 10*3/uL (ref 0.0–0.1)
Basophils Relative: 0 %
Eosinophils Absolute: 0 10*3/uL (ref 0.0–0.5)
Eosinophils Relative: 0 %
HCT: 42.2 % (ref 36.0–46.0)
Hemoglobin: 12.9 g/dL (ref 12.0–15.0)
Immature Granulocytes: 1 %
Lymphocytes Relative: 8 %
Lymphs Abs: 1.3 10*3/uL (ref 0.7–4.0)
MCH: 24.7 pg — ABNORMAL LOW (ref 26.0–34.0)
MCHC: 30.6 g/dL (ref 30.0–36.0)
MCV: 80.8 fL (ref 80.0–100.0)
Monocytes Absolute: 0.9 10*3/uL (ref 0.1–1.0)
Monocytes Relative: 5 %
Neutro Abs: 14.2 10*3/uL — ABNORMAL HIGH (ref 1.7–7.7)
Neutrophils Relative %: 86 %
Platelets: 253 10*3/uL (ref 150–400)
RBC: 5.22 MIL/uL — ABNORMAL HIGH (ref 3.87–5.11)
RDW: 14.8 % (ref 11.5–15.5)
WBC: 16.6 10*3/uL — ABNORMAL HIGH (ref 4.0–10.5)
nRBC: 0 % (ref 0.0–0.2)

## 2023-10-31 LAB — CBG MONITORING, ED: Glucose-Capillary: 114 mg/dL — ABNORMAL HIGH (ref 70–99)

## 2023-10-31 LAB — COMPREHENSIVE METABOLIC PANEL WITH GFR
ALT: 19 U/L (ref 0–44)
AST: 29 U/L (ref 15–41)
Albumin: 3.2 g/dL — ABNORMAL LOW (ref 3.5–5.0)
Alkaline Phosphatase: 87 U/L (ref 38–126)
Anion gap: 16 — ABNORMAL HIGH (ref 5–15)
BUN: 11 mg/dL (ref 8–23)
CO2: 18 mmol/L — ABNORMAL LOW (ref 22–32)
Calcium: 9.4 mg/dL (ref 8.9–10.3)
Chloride: 105 mmol/L (ref 98–111)
Creatinine, Ser: 0.76 mg/dL (ref 0.44–1.00)
GFR, Estimated: >60 mL/min (ref >60)
Glucose, Bld: 115 mg/dL — ABNORMAL HIGH (ref 70–99)
Potassium: 3.8 mmol/L (ref 3.5–5.1)
Sodium: 139 mmol/L (ref 135–145)
Total Bilirubin: 1.5 mg/dL — ABNORMAL HIGH (ref 0.0–1.2)
Total Protein: 7.7 g/dL (ref 6.5–8.1)

## 2023-10-31 LAB — I-STAT CG4 LACTIC ACID, ED
Lactic Acid, Venous: 3.7 mmol/L (ref 0.5–1.9)
Lactic Acid, Venous: 1.2 mmol/L (ref 0.5–1.9)

## 2023-10-31 LAB — BRAIN NATRIURETIC PEPTIDE: B Natriuretic Peptide: 87.5 pg/mL (ref 0.0–100.0)

## 2023-10-31 LAB — D-DIMER, QUANTITATIVE: D-Dimer, Quant: 3.95 ug{FEU}/mL — ABNORMAL HIGH (ref 0.00–0.50)

## 2023-10-31 MED ORDER — ACETAMINOPHEN 325 MG PO TABS
650.0000 mg | ORAL_TABLET | Freq: Four times a day (QID) | ORAL | Status: DC | PRN
  Administered 2023-11-03: 650 mg via ORAL
  Filled 2023-10-31: qty 2

## 2023-10-31 MED ORDER — LEVETIRACETAM IN NACL 1000 MG/100ML IV SOLN
1000.0000 mg | Freq: Once | INTRAVENOUS | Status: AC
  Administered 2023-10-31: 1000 mg via INTRAVENOUS
  Filled 2023-10-31: qty 100

## 2023-10-31 MED ORDER — SODIUM CHLORIDE 0.9% FLUSH
3.0000 mL | Freq: Two times a day (BID) | INTRAVENOUS | Status: DC
  Administered 2023-11-01 – 2023-11-04 (×7): 3 mL via INTRAVENOUS

## 2023-10-31 MED ORDER — SODIUM CHLORIDE 0.9 % IV BOLUS
1000.0000 mL | Freq: Once | INTRAVENOUS | Status: AC
  Administered 2023-10-31: 1000 mL via INTRAVENOUS

## 2023-10-31 MED ORDER — LEVETIRACETAM 500 MG/5ML IV SOLN: 2000.0000 mg | Freq: Once | INTRAVENOUS | Status: DC

## 2023-10-31 MED ORDER — IOHEXOL 350 MG/ML SOLN
85.0000 mL | Freq: Once | INTRAVENOUS | Status: AC | PRN
  Administered 2023-10-31: 85 mL via INTRAVENOUS

## 2023-10-31 MED ORDER — ENOXAPARIN SODIUM 40 MG/0.4ML IJ SOSY
40.0000 mg | PREFILLED_SYRINGE | INTRAMUSCULAR | Status: DC
  Administered 2023-11-01 – 2023-11-03 (×4): 40 mg via SUBCUTANEOUS
  Filled 2023-10-31 (×4): qty 0.4

## 2023-10-31 MED ORDER — LEVETIRACETAM 500 MG PO TABS
500.0000 mg | ORAL_TABLET | Freq: Two times a day (BID) | ORAL | Status: DC
  Administered 2023-11-01 – 2023-11-04 (×7): 500 mg via ORAL
  Filled 2023-10-31 (×7): qty 1

## 2023-10-31 MED ORDER — MAGNESIUM SULFATE 2 GM/50ML IV SOLN
2.0000 g | Freq: Once | INTRAVENOUS | Status: AC
  Administered 2023-10-31: 2 g via INTRAVENOUS
  Filled 2023-10-31: qty 50

## 2023-10-31 MED ORDER — PROCHLORPERAZINE EDISYLATE 10 MG/2ML IJ SOLN: 5.0000 mg | Freq: Four times a day (QID) | INTRAMUSCULAR | Status: DC | PRN

## 2023-10-31 MED ORDER — DILTIAZEM HCL-DEXTROSE 125-5 MG/125ML-% IV SOLN (PREMIX)
5.0000 mg/h | INTRAVENOUS | Status: DC
  Administered 2023-10-31: 5 mg/h via INTRAVENOUS
  Filled 2023-10-31: qty 125

## 2023-10-31 MED ORDER — ADENOSINE 6 MG/2ML IV SOLN
6.0000 mg | Freq: Once | INTRAVENOUS | Status: AC
  Administered 2023-10-31: 6 mg via INTRAVENOUS
  Filled 2023-10-31: qty 2

## 2023-10-31 MED ORDER — OXYCODONE HCL 5 MG PO TABS
5.0000 mg | ORAL_TABLET | ORAL | Status: DC | PRN
  Administered 2023-11-01 – 2023-11-03 (×8): 5 mg via ORAL
  Filled 2023-10-31 (×9): qty 1

## 2023-10-31 MED ORDER — SENNOSIDES-DOCUSATE SODIUM 8.6-50 MG PO TABS
1.0000 | ORAL_TABLET | Freq: Every evening | ORAL | Status: DC | PRN
  Administered 2023-11-03: 1 via ORAL
  Filled 2023-10-31: qty 1

## 2023-10-31 MED ORDER — LACTATED RINGERS IV SOLN
INTRAVENOUS | Status: AC
Start: 2023-10-31 — End: 2023-11-01

## 2023-10-31 MED ORDER — PIPERACILLIN-TAZOBACTAM 3.375 G IVPB 30 MIN
3.3750 g | Freq: Once | INTRAVENOUS | Status: AC
Start: 2023-10-31 — End: 2023-10-31
  Administered 2023-10-31: 3.375 g via INTRAVENOUS
  Filled 2023-10-31: qty 50

## 2023-10-31 MED ORDER — METOPROLOL TARTRATE 5 MG/5ML IV SOLN
5.0000 mg | Freq: Once | INTRAVENOUS | Status: AC
  Administered 2023-10-31: 5 mg via INTRAVENOUS
  Filled 2023-10-31: qty 5

## 2023-10-31 MED ORDER — DILTIAZEM HCL 25 MG/5ML IV SOLN
20.0000 mg | Freq: Once | INTRAVENOUS | Status: AC
  Administered 2023-10-31: 20 mg via INTRAVENOUS
  Filled 2023-10-31: qty 5

## 2023-10-31 MED ORDER — ACETAMINOPHEN 650 MG RE SUPP: 650.0000 mg | Freq: Four times a day (QID) | RECTAL | Status: DC | PRN

## 2023-10-31 NOTE — ED Provider Notes (Signed)
 [facility]GENC[facility] DEPARTMENT AT [facility] Provider Note   CSN: [id number] Arrival date & time: 10/31/23  1609     History  Chief Complaint  Patient presents with   Seizures    Pt BIB EMS. Pt had witnesses seizure by coworker, approx. 2 min. GCS 13, responds to verbal stimuli. Unknown history.    Valerie Carter is a 63 y.o. female.  With a history of hypertension, hyperlipidemia, diabetes and status post gastrectomy with sleeve who presents after reported seizure.  Patient works as a Designer, industrial/product and was noted to have a witnessed seizure seen by coworker today.  It lasted approximately 2 minutes.  She was confused transiently thereafter.  This did not occur while she was driving.  Here in the ED patient is unable to recall events earlier.  She has not had any recent fevers or illness.  Denies headaches neck pain chest pain shortness of breath and GI symptoms.  No prior history of seizures.  Social alcohol use.  No other drug use.  No recent medication changes   Seizures      Home Medications Prior to Admission medications   Medication Sig Start Date End Date Taking? Authorizing Provider  Acetaminophen  (TYLENOL  ARTHRITIS PAIN PO) Take 1 tablet by mouth every 12 (twelve) hours.    [provider]  Blood Pressure KIT Check your blood pressure 2-3 times a week 06/02/21   Valli Gaw, MD  lisinopril  (ZESTRIL ) 5 MG tablet TAKE 1 TABLET BY MOUTH AT BEDTIME 08/26/21   Mahoney, Caitlin, MD  Multiple Vitamin (MULTIVITAMIN WITH MINERALS) TABS tablet Take 1 tablet by mouth daily. 03/19/23   Hongalgi, Anand D, MD  polyethylene glycol (MIRALAX  / GLYCOLAX ) 17 g packet Take 17 g by mouth daily as needed for mild constipation. 03/18/23   Hongalgi, Anand D, MD  senna (SENOKOT) 8.6 MG TABS tablet Take 1 tablet (8.6 mg total) by mouth 2 (two) times daily. 03/18/23   Hongalgi, Anand D, MD  thiamine  (VITAMIN B-1) 100 MG tablet Take 1 tablet (100 mg total) by mouth daily. 03/19/23    Hongalgi, Anand D, MD  tirzepatide  ([name] ) 10 MG/0.5ML Pen Inject 10 mg into the skin once a week. 09/22/21   Jonn Nett, DO  tiZANidine (ZANAFLEX) 4 MG tablet Take 4 mg by mouth at bedtime as needed. 05/11/23   [provider]      Allergies    Patient has no known allergies.    Review of Systems   Review of Systems  Neurological:  Positive for seizures.    Physical Exam Updated Vital Signs BP (!) 97/53 (BP Location: Right Arm)   Pulse (!) 132   Temp 98 F (36.7 C) (Oral)   Resp (!) 24   Ht 5\' 8"  (1.727 m)   Wt 113.4 kg   SpO2 99%   BMI 38.01 kg/m  Physical Exam Vitals and nursing note reviewed.  HENT:     Head: Normocephalic and atraumatic.  Eyes:     Extraocular Movements: Extraocular movements intact.     Pupils: Pupils are equal, round, and reactive to light.  Cardiovascular:     Rate and Rhythm: Normal rate and regular rhythm.  Pulmonary:     Effort: Pulmonary effort is normal.     Breath sounds: Normal breath sounds.  Abdominal:     Palpations: Abdomen is soft.     Tenderness: There is no abdominal tenderness.  Skin:    General: Skin is warm and dry.  Neurological:     General: No focal deficit present.     Mental Status: She is alert and oriented to person, place, and time.     Sensory: No sensory deficit.     Motor: No weakness.     Coordination: Coordination normal.  Psychiatric:        Mood and Affect: Mood normal.     ED Results / Procedures / Treatments   Labs (all labs ordered are listed, but only abnormal results are displayed) Labs Reviewed  COMPREHENSIVE METABOLIC PANEL WITH GFR - Abnormal; Notable for the following components:      Result Value   CO2 18 (*)    Glucose, Bld 115 (*)    Albumin 3.2 (*)    Total Bilirubin 1.5 (*)    Anion gap 16 (*)    All other components within normal limits  CBC WITH DIFFERENTIAL/PLATELET - Abnormal; Notable for the following components:   WBC 16.6 (*)    RBC 5.22 (*)    MCH 24.7 (*)     Neutro Abs 14.2 (*)    Abs Immature Granulocytes 0.08 (*)    All other components within normal limits  MAGNESIUM - Abnormal; Notable for the following components:   Magnesium 1.6 (*)    All other components within normal limits  URINALYSIS, W/ REFLEX TO CULTURE (INFECTION SUSPECTED) - Abnormal; Notable for the following components:   APPearance HAZY (*)    Hgb urine dipstick MODERATE (*)    Ketones, ur 20 (*)    Bacteria, UA MANY (*)    All other components within normal limits  CBG MONITORING, ED - Abnormal; Notable for the following components:   Glucose-Capillary 114 (*)    All other components within normal limits  I-STAT CG4 LACTIC ACID, ED - Abnormal; Notable for the following components:   Lactic Acid, Venous 3.7 (*)    All other components within normal limits  TSH  T4, FREE  BRAIN NATRIURETIC PEPTIDE  I-STAT CG4 LACTIC ACID, ED    EKG None  Radiology DG Chest Portable 1 View Result Date: 10/31/2023 CLINICAL DATA:  Difficulty breathing EXAM: PORTABLE CHEST 1 VIEW COMPARISON:  05/12/2021 FINDINGS: Cardiac shadow is within normal limits. The lungs are well aerated bilaterally. No focal infiltrate or effusion is seen. No bony abnormality is noted. IMPRESSION: No acute abnormality seen. Electronically Signed   By: Violeta Grey M.D.   On: 10/31/2023 21:43   CT Head Wo Contrast Result Date: 10/31/2023 CLINICAL DATA:  Seizure, new onset, no history of trauma EXAM: CT HEAD WITHOUT CONTRAST TECHNIQUE: Contiguous axial images were obtained from the base of the skull through the vertex without intravenous contrast. RADIATION DOSE REDUCTION: This exam was performed according to the departmental dose-optimization program which includes automated exposure control, adjustment of the mA and/or kV according to patient size and/or use of iterative reconstruction technique. COMPARISON:  CT head 03/25/2021 FINDINGS: Brain: No intracranial hemorrhage, mass effect, or evidence of acute infarct.  No hydrocephalus. No extra-axial fluid collection. Vascular: No hyperdense vessel or unexpected calcification. Skull: No fracture or focal lesion. Sinuses/Orbits: Marked mucosal thickening in the left maxillary sinus with air-fluid level. Left greater than right mucosal thickening in the ethmoid air cells and frontal sinuses. No mastoid effusion. Other: None. IMPRESSION: 1. No acute intracranial abnormality. 2. Paranasal sinus mucosal thickening greatest in the left maxillary sinus with air-fluid level. Correlate for acute sinusitis. Electronically Signed   By: Rozell Cornet M.D.   On: 10/31/2023 20:05  CT ABDOMEN PELVIS W CONTRAST Result Date: 10/31/2023 CLINICAL DATA:  Sepsis, left upper quadrant pain, hematuria EXAM: CT ABDOMEN AND PELVIS WITH CONTRAST TECHNIQUE: Multidetector CT imaging of the abdomen and pelvis was performed using the standard protocol following bolus administration of intravenous contrast. RADIATION DOSE REDUCTION: This exam was performed according to the departmental dose-optimization program which includes automated exposure control, adjustment of the mA and/or kV according to patient size and/or use of iterative reconstruction technique. CONTRAST:  85mL OMNIPAQUE IOHEXOL 350 MG/ML SOLN COMPARISON:  None Available. FINDINGS: Lower chest: No acute abnormality Hepatobiliary: No focal hepatic abnormality. Gallbladder unremarkable. Pancreas: No focal abnormality or ductal dilatation. Spleen: No focal abnormality.  Normal size. Adrenals/Urinary Tract: No adrenal abnormality. No focal renal abnormality. No stones or hydronephrosis. Urinary bladder is unremarkable. Stomach/Bowel: Normal appendix. Prior gastric sleeve. Small hiatal hernia. No bowel obstruction or inflammatory process. Vascular/Lymphatic: Aortic atherosclerosis. No evidence of aneurysm or adenopathy. Reproductive: Endometrium thickened for post cholecystectomy state, 14 mm. Uterus appears diffusely enlarged. This may be related  to fibroids, but no well-defined fibroid visible by CT. No adnexal mass. Other: Trace free fluid in the cul-de-sac. No free air. Small umbilical hernia containing fat. Musculoskeletal: No acute bony abnormality. IMPRESSION: Thickened endometrium, 14 mm. Uterus appears diffusely enlarged without well-defined fibroid. Cannot exclude endometrial cancer and/or infiltrating process. Consider further evaluation with pelvic ultrasound or MRI. Prior gastric sleeve. Aortic atherosclerosis. Small umbilical hernia containing fat. Electronically Signed   By: Janeece Mechanic M.D.   On: 10/31/2023 20:05    Procedures .Critical Care  Performed by: Sallyanne Creamer, DO Authorized by: Sallyanne Creamer, DO   Critical care provider statement:    Critical care time (minutes):  33   Critical care time was exclusive of:  Separately billable procedures and treating other patients and teaching time   Critical care was necessary to treat or prevent imminent or life-threatening deterioration of the following conditions:  Cardiac failure   Critical care was time spent personally by me on the following activities:  Development of treatment plan with patient or surrogate, discussions with consultants, evaluation of patient's response to treatment, examination of patient, ordering and review of laboratory studies, ordering and review of radiographic studies, ordering and performing treatments and interventions, pulse oximetry, re-evaluation of patient's condition and review of old charts   I assumed direction of critical care for this patient from another provider in my specialty: no     Care discussed with: admitting provider   Comments:     Discussed with neurology, cardiology and admitting hospitalist     Medications Ordered in ED Medications  levETIRAcetam (KEPPRA) tablet 500 mg (has no administration in time range)  diltiazem (CARDIZEM) 125 mg in dextrose  5% 125 mL (1 mg/mL) infusion (5 mg/hr Intravenous New Bag/Given  10/31/23 2228)  sodium chloride  0.9 % bolus 1,000 mL (0 mLs Intravenous Stopped 10/31/23 1855)  levETIRAcetam (KEPPRA) IVPB 1000 mg/100 mL premix (0 mg Intravenous Stopped 10/31/23 1848)    Followed by  levETIRAcetam (KEPPRA) IVPB 1000 mg/100 mL premix (0 mg Intravenous Stopped 10/31/23 1848)  magnesium sulfate IVPB 2 g 50 mL (0 g Intravenous Stopped 10/31/23 2020)  diltiazem (CARDIZEM) injection 20 mg (20 mg Intravenous Given 10/31/23 1853)  sodium chloride  0.9 % bolus 1,000 mL (0 mLs Intravenous Stopped 10/31/23 2055)  piperacillin-tazobactam (ZOSYN) IVPB 3.375 g (0 g Intravenous Stopped 10/31/23 2026)  iohexol (OMNIPAQUE) 350 MG/ML injection 85 mL (85 mLs Intravenous Contrast Given 10/31/23 1942)  adenosine (ADENOCARD) 6 MG/2ML  injection 6 mg (6 mg Intravenous Given 10/31/23 2107)  metoprolol  tartrate (LOPRESSOR ) injection 5 mg (5 mg Intravenous Given 10/31/23 2156)    ED Course/ Medical Decision Making/ A&P Clinical Course as of 10/31/23 2231  Mon Oct 31, 2023  1922 Laboratory workup notable for leukocytosis of 16.6 with neutrophilic predominance.  Initial venous lactic elevated at 3.7.  Concerning for seizure activity versus infectious process in conjunction with leukocytosis.  Will repeat after IV fluids.  UA shows hematuria.  Patient does now mention left-sided abdominal pain.  Will obtain CT abdomen pelvis to look for acute infectious process.  Hypomagnesemia of 1.6.  Will replete magnesium  Discussed initial EKG with Dr.Mallipeddi (cardiology) who feels this is most likely sinus tach versus multifocal atrial tachycardia.  Agrees with plan for continued IV fluid bolus and antibiotics.  If heart rate remains elevated can trial 6 mg atropine to slow rate and get a better look at the rhythm [MP]  2105 CT head shows no acute intracranial abnormality question of acute sinusitis but this does not correlate clinically as she has not had much congestion or other symptoms at this time.  CT abdomen pelvis shows no  acute intra abdominal process but does show thickening of the endometrium which patient has had before going back to 2017.  I informed her of this finding and potential need for additional testing with MRI or ultrasound on a nonemergent basis  Discussed first-time seizure with Dr. Alecia Ames (neurology) who will evaluate patient in ED and plan for likely MRI and EEG during her admission. [MP]  2131 Patient remained tachycardic in the 130s after IV fluids and dose of diltiazem.  We trialed 6 mg atropine which slowed her to 110s.  Printed out a rhythm strip and discussed this with Dr. Sharyn Deforest (cardiology) who feels this is most likely multifocal atrial tachycardia and recommends trial of 5 mg IV metoprolol  given that she has had adequate volume resuscitation and has not been hypotensive at all.  Will try this and he will evaluate her in the ED [MP]  2229 Dr Sharyn Deforest has evaluated patient in the ED.  No significant change in heart rate after metoprolol .  Pressure did drop some but has remained stable.  Dr. Sharyn Deforest will trial another dose of Cardizem and continue to follow the patient.  She will be admitted to hospitalist service [MP]    Clinical Course User Index [MP] Sallyanne Creamer, DO                                 Medical Decision Making 63 year old female with history as above presenting after reported seizure.  No prior history of seizure.  No recurrence in seizure activity.  Syncope versus seizure.  Will obtain EKG laboratory workup and CT head given that this was her first reported seizure.  Will obtain lactic acid to see if this is elevated which would be consistent with a seizure.  Will keep on seizure precautions and give a dose of Keppra.  Once workup is complete we will reach out to neurology for further recommendation  Amount and/or Complexity of Data Reviewed Labs: ordered. Radiology: ordered.  Risk Prescription drug management. Decision regarding  hospitalization.           Final Clinical Impression(s) / ED Diagnoses Final diagnoses:  Seizure (HCC)  Multifocal atrial tachycardia (HCC)    Rx / DC Orders ED Discharge Orders     None  Sallyanne Creamer, DO 10/31/23 2231

## 2023-10-31 NOTE — H&P (Signed)
 History and Physical    Valerie Carter ZOX:[id number] DOB: Oct 31, 1960 DOA: 10/31/2023  PCP: [facility]   Patient coming from: Home   Chief Complaint: Witnessed seizure   HPI: Valerie Carter is a 63 y.o. female with medical history significant for hypertension, hyperlipidemia, type 2 diabetes mellitus, and 1 prior seizure-like episode who presents after a witnessed seizure.  Patient was in her usual condition today, working as a Midwife, and it brought the bus to a stop when she was observed to have a seizure lasting approximately 2 minutes.  She bit her tongue during the episode and was confused and lethargic immediately afterwards.  She denies headache, neck pain, chest pain, shortness of breath, fever, chills, or focal numbness or weakness.  She denies excessive alcohol use or illicit substance use.  ED Course: Upon arrival to the ED, patient is found to be afebrile and saturating well on room air with elevated RR, elevated HR, and SBP in the 90s.  Labs are most notable for normal creatinine, magnesium 1.6, WBC 16,600, and lactic acid 3.7.  Head CT is negative for acute findings.  No acute abnormalities noted on chest x-ray.  CT of the abdomen and pelvis reveals thickened endometrium.  Cardiology and neurology were consulted by the ED physician and the patient was given IV Keppra, adenosine, Lopressor , diltiazem, IV magnesium, 2 L NS, and was started on diltiazem infusion.  Review of Systems:  ROS limited by patient's clinical condition.  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   Diabetes mellitus without complication (HCC)    type 2   DJD (degenerative joint disease)    both knees right worse than left   Headache    Hyperlipidemia    Hypertension    Intramural leiomyoma of uterus    Joint pain    Knee pain    Lactose intolerance    Lower extremity edema     Morbid obesity with BMI of 50.0-59.9, adult (HCC)    Osteoarthritis    Vitamin D  deficiency     Past Surgical History:  Procedure Laterality Date   BREAST BIOPSY Left 01/04/2023   US  LT BREAST BX W LOC DEV 1ST LESION IMG BX SPEC US  GUIDE 01/04/2023 [facility]   KNEE SURGERY Right 1999   torn cartlidge repair   LAPAROSCOPIC GASTRIC SLEEVE RESECTION N/A 06/07/2017   Procedure: LAPAROSCOPIC GASTRIC SLEEVE RESECTION, UPPER ENDOSCOPY;  Surgeon: Dorrie Gaudier, Alphonso Aschoff, MD;  Location: [facility];  Service: General;  Laterality: N/A;    Social History:   reports that she has never smoked. She has never used smokeless tobacco. She reports current alcohol use of about 14.0 standard drinks of alcohol per week. She reports that she does not use drugs.  No Known Allergies  Family History  Problem Relation Age of Onset   Diabetes Sister    Hypertension Sister    Diabetes Maternal Aunt    Breast cancer Maternal Aunt 29   Hypertension Mother    Diabetes Father      Prior to Admission medications   Medication Sig Start Date End Date Taking? Authorizing Provider  acetaminophen  (ACETAMINOPHEN  8 HOUR) 650 MG CR tablet Take 1,300 mg by mouth every 12 (twelve) hours.   Yes [provider]  HYDROcodone -acetaminophen  (NORCO/VICODIN) 5-325 MG tablet Take 1 tablet by mouth every 8 (eight)  hours as needed. 10/24/23  Yes [provider]  lisinopril  (ZESTRIL ) 5 MG tablet TAKE 1 TABLET BY MOUTH AT BEDTIME Patient taking differently: Take 5 mg by mouth daily as needed (HBP). Take one tablet by mouth is >150 08/26/21  Yes Mahoney, Caitlin, MD  Menthol, Topical Analgesic, (BIOFREEZE ROLL-ON) 4 % GEL Apply 1 application  topically daily.   Yes [provider]  [name]  15 MG/0.5ML Pen SMARTSIG:15 Milligram(s) SUB-Q Once a Week 10/24/23  Yes [provider]  Multiple Vitamin (MULTIVITAMIN WITH MINERALS) TABS tablet Take 1 tablet by mouth daily. 03/19/23  Yes Hongalgi, Anand D, MD   polyethylene glycol (MIRALAX  / GLYCOLAX ) 17 g packet Take 17 g by mouth daily as needed for mild constipation. 03/18/23  Yes Hongalgi, Anand D, MD  senna (SENOKOT) 8.6 MG TABS tablet Take 1 tablet (8.6 mg total) by mouth 2 (two) times daily. Patient taking differently: Take 1 tablet by mouth daily. 03/18/23  Yes Hongalgi, Anand D, MD  thiamine  (VITAMIN B-1) 100 MG tablet Take 1 tablet (100 mg total) by mouth daily. 03/19/23  Yes Casey Clay, MD    Physical Exam: Vitals:   10/31/23 2030 10/31/23 2100 10/31/23 2145 10/31/23 2200  BP: (!) 152/80 (!) 145/90 111/76 (!) 97/53  Pulse: (!) 127 (!) 132 (!) 132 (!) 132  Resp: (!) 24 (!) 34 (!) 30 (!) 24  Temp:    98 F (36.7 C)  TempSrc:    Oral  SpO2: 99% 99% 100% 99%  Weight:      Height:        Constitutional: NAD, no pallor or diaphoresis  Eyes: PERTLA, lids and conjunctivae normal ENMT: Mucous membranes are moist. Posterior pharynx clear of any exudate or lesions.   Neck: supple, no masses  Respiratory: no wheezing, no crackles. No accessory muscle use.  Cardiovascular: S1 & S2 heard, regular rate and rhythm. No extremity edema.  Abdomen: No tenderness, soft. Bowel sounds active.  Musculoskeletal: no clubbing / cyanosis. No joint deformity upper and lower extremities.   Skin: no significant rashes, lesions, ulcers. Warm, dry, well-perfused. Neurologic: CN 2-12 grossly intact. Moving all extremities. Sleeping, wakes to voice and is oriented to person, place, and situation.  Psychiatric: Calm. Cooperative.    Labs and Imaging on Admission: I have personally reviewed following labs and imaging studies  CBC: Recent Labs  Lab 10/31/23 1659  WBC 16.6*  NEUTROABS 14.2*  HGB 12.9  HCT 42.2  MCV 80.8  PLT 253   Basic Metabolic Panel: Recent Labs  Lab 10/31/23 1659  NA 139  K 3.8  CL 105  CO2 18*  GLUCOSE 115*  BUN 11  CREATININE 0.76  CALCIUM  9.4  MG 1.6*   GFR: Estimated Creatinine Clearance: 96.3 mL/min (by C-G  formula based on SCr of 0.76 mg/dL). Liver Function Tests: Recent Labs  Lab 10/31/23 1659  AST 29  ALT 19  ALKPHOS 87  BILITOT 1.5*  PROT 7.7  ALBUMIN 3.2*   No results for input(s): "LIPASE", "AMYLASE" in the last 168 hours. No results for input(s): "AMMONIA" in the last 168 hours. Coagulation Profile: No results for input(s): "INR", "PROTIME" in the last 168 hours. Cardiac Enzymes: No results for input(s): "CKTOTAL", "CKMB", "CKMBINDEX", "TROPONINI" in the last 168 hours. BNP (last 3 results) No results for input(s): "PROBNP" in the last 8760 hours. HbA1C: No results for input(s): "HGBA1C" in the last 72 hours. CBG: Recent Labs  Lab 10/31/23 1715  GLUCAP 114*   Lipid Profile:  No results for input(s): "CHOL", "HDL", "LDLCALC", "TRIG", "CHOLHDL", "LDLDIRECT" in the last 72 hours. Thyroid Function Tests: No results for input(s): "TSH", "T4TOTAL", "FREET4", "T3FREE", "THYROIDAB" in the last 72 hours. Anemia Panel: No results for input(s): "VITAMINB12", "FOLATE", "FERRITIN", "TIBC", "IRON ", "RETICCTPCT" in the last 72 hours. Urine analysis:    Component Value Date/Time   COLORURINE YELLOW 10/31/2023 1659   APPEARANCEUR HAZY (A) 10/31/2023 1659   LABSPEC 1.010 10/31/2023 1659   PHURINE 6.0 10/31/2023 1659   GLUCOSEU NEGATIVE 10/31/2023 1659   HGBUR MODERATE (A) 10/31/2023 1659   HGBUR small 12/04/2008 0950   BILIRUBINUR NEGATIVE 10/31/2023 1659   BILIRUBINUR negative 03/16/2018 1009   BILIRUBINUR NEG 08/14/2015 1055   KETONESUR 20 (A) 10/31/2023 1659   PROTEINUR NEGATIVE 10/31/2023 1659   UROBILINOGEN 0.2 03/16/2018 1009   UROBILINOGEN 1.0 12/04/2008 0950   NITRITE NEGATIVE 10/31/2023 1659   LEUKOCYTESUR NEGATIVE 10/31/2023 1659   Sepsis Labs: @LABRCNTIP (procalcitonin:4,lacticidven:4) )No results found for this or any previous visit (from the past 240 hours).   Radiological Exams on Admission: DG Chest Portable 1 View Result Date: 10/31/2023 CLINICAL DATA:   Difficulty breathing EXAM: PORTABLE CHEST 1 VIEW COMPARISON:  05/12/2021 FINDINGS: Cardiac shadow is within normal limits. The lungs are well aerated bilaterally. No focal infiltrate or effusion is seen. No bony abnormality is noted. IMPRESSION: No acute abnormality seen. Electronically Signed   By: Violeta Grey M.D.   On: 10/31/2023 21:43   CT Head Wo Contrast Result Date: 10/31/2023 CLINICAL DATA:  Seizure, new onset, no history of trauma EXAM: CT HEAD WITHOUT CONTRAST TECHNIQUE: Contiguous axial images were obtained from the base of the skull through the vertex without intravenous contrast. RADIATION DOSE REDUCTION: This exam was performed according to the departmental dose-optimization program which includes automated exposure control, adjustment of the mA and/or kV according to patient size and/or use of iterative reconstruction technique. COMPARISON:  CT head 03/25/2021 FINDINGS: Brain: No intracranial hemorrhage, mass effect, or evidence of acute infarct. No hydrocephalus. No extra-axial fluid collection. Vascular: No hyperdense vessel or unexpected calcification. Skull: No fracture or focal lesion. Sinuses/Orbits: Marked mucosal thickening in the left maxillary sinus with air-fluid level. Left greater than right mucosal thickening in the ethmoid air cells and frontal sinuses. No mastoid effusion. Other: None. IMPRESSION: 1. No acute intracranial abnormality. 2. Paranasal sinus mucosal thickening greatest in the left maxillary sinus with air-fluid level. Correlate for acute sinusitis. Electronically Signed   By: Rozell Cornet M.D.   On: 10/31/2023 20:05   CT ABDOMEN PELVIS W CONTRAST Result Date: 10/31/2023 CLINICAL DATA:  Sepsis, left upper quadrant pain, hematuria EXAM: CT ABDOMEN AND PELVIS WITH CONTRAST TECHNIQUE: Multidetector CT imaging of the abdomen and pelvis was performed using the standard protocol following bolus administration of intravenous contrast. RADIATION DOSE REDUCTION: This exam was  performed according to the departmental dose-optimization program which includes automated exposure control, adjustment of the mA and/or kV according to patient size and/or use of iterative reconstruction technique. CONTRAST:  85mL OMNIPAQUE IOHEXOL 350 MG/ML SOLN COMPARISON:  None Available. FINDINGS: Lower chest: No acute abnormality Hepatobiliary: No focal hepatic abnormality. Gallbladder unremarkable. Pancreas: No focal abnormality or ductal dilatation. Spleen: No focal abnormality.  Normal size. Adrenals/Urinary Tract: No adrenal abnormality. No focal renal abnormality. No stones or hydronephrosis. Urinary bladder is unremarkable. Stomach/Bowel: Normal appendix. Prior gastric sleeve. Small hiatal hernia. No bowel obstruction or inflammatory process. Vascular/Lymphatic: Aortic atherosclerosis. No evidence of aneurysm or adenopathy. Reproductive: Endometrium thickened for post cholecystectomy state, 14 mm. Uterus  appears diffusely enlarged. This may be related to fibroids, but no well-defined fibroid visible by CT. No adnexal mass. Other: Trace free fluid in the cul-de-sac. No free air. Small umbilical hernia containing fat. Musculoskeletal: No acute bony abnormality. IMPRESSION: Thickened endometrium, 14 mm. Uterus appears diffusely enlarged without well-defined fibroid. Cannot exclude endometrial cancer and/or infiltrating process. Consider further evaluation with pelvic ultrasound or MRI. Prior gastric sleeve. Aortic atherosclerosis. Small umbilical hernia containing fat. Electronically Signed   By: Janeece Mechanic M.D.   On: 10/31/2023 20:05    EKG: Independently reviewed. Rate 140, PACs, RBBB, LAFB.   Assessment/Plan   1. Seizure  - Continue seizure precautions, check MRI brain and EEG, continue Keppra with 500 mg BID   2. Atrial tachycardia  - Continue diltiazem infusion with titration, check echocardiogram    3. Hypertension  - Hold lisinopril  while titrating rate-control medications   4. SIRS   - No apparent infectious process  - Monitor off of antibiotics    5. Endometrial thickening  - Noted on CT in ED  - Will need further imaging with MRI or US , can be done outpatient    DVT prophylaxis: Lovenox   Code Status: Full  Level of Care: Level of care: Progressive Family Communication: none present  Disposition Plan:  Patient is from: home  Anticipated d/c is to: Home  Anticipated d/c date is: 5/6 or 11/02/23  Patient currently: Pending MRI, EEG, echo, rate-control, stable BP  Consults called: Neurology, cardiology  Admission status: Observation     Walton Guppy, MD [facility]  10/31/2023, 11:12 PM

## 2023-10-31 NOTE — Consult Note (Signed)
 Referring Physician: Rafael Bun, DO  Valerie Carter is an 63 y.o. female.                       Chief Complaint: Atrial tachycardia  HPI: 63 years old black female with PMH of anemia, type 2 DM, HTN, HLD and DJD had witnessed seizure lasting for 2 minutes. Post seizures she has multifocal atrial tachycardia. Bolus of 20 mg. Diltiazem, 5 mg. IV metoprolol  and IV fluids has made no significant change in heart rate of 130's/min. Thyroid panel is pending. BNP is ordered. CXR is unremarkable. CT head is without acute intracranial abnormality with possible acute sinusitis. CT abdomen is concerning for endometrial infiltration/cancer. Echocardiogram is pending.  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   Diabetes mellitus without complication (HCC)    type 2   DJD (degenerative joint disease)    both knees right worse than left   Headache    Hyperlipidemia    Hypertension    Intramural leiomyoma of uterus    Joint pain    Knee pain    Lactose intolerance    Lower extremity edema    Morbid obesity with BMI of 50.0-59.9, adult (HCC)    Osteoarthritis    Vitamin D  deficiency       Past Surgical History:  Procedure Laterality Date   BREAST BIOPSY Left 01/04/2023   US  LT BREAST BX W LOC DEV 1ST LESION IMG BX SPEC US  GUIDE 01/04/2023 [facility]   KNEE SURGERY Right 1999   torn cartlidge repair   LAPAROSCOPIC GASTRIC SLEEVE RESECTION N/A 06/07/2017   Procedure: LAPAROSCOPIC GASTRIC SLEEVE RESECTION, UPPER ENDOSCOPY;  Surgeon: Dorrie Gaudier, Alphonso Aschoff, MD;  Location: [facility];  Service: General;  Laterality: N/A;    Family History  Problem Relation Age of Onset   Diabetes Sister    Hypertension Sister    Diabetes Maternal Aunt    Breast cancer Maternal Aunt 29   Hypertension Mother    Diabetes Father    Social History:  reports that she has never smoked. She has never used smokeless  tobacco. She reports current alcohol use of about 14.0 standard drinks of alcohol per week. She reports that she does not use drugs.  Allergies: No Known Allergies  (Not in a hospital admission)   Results for orders placed or performed during the hospital encounter of 10/31/23 (from the past 48 hours)  Comprehensive metabolic panel     Status: Abnormal   Collection Time: 10/31/23  4:59 PM  Result Value Ref Range   Sodium 139 135 - 145 mmol/L   Potassium 3.8 3.5 - 5.1 mmol/L   Chloride 105 98 - 111 mmol/L   CO2 18 (L) 22 - 32 mmol/L   Glucose, Bld 115 (H) 70 - 99 mg/dL    Comment: Glucose reference range applies only to samples taken after fasting for at least 8 hours.   BUN 11 8 - 23 mg/dL   Creatinine, Ser 5.28 0.44 - 1.00 mg/dL   Calcium  9.4 8.9 - 10.3 mg/dL   Total Protein 7.7 6.5 - 8.1 g/dL   Albumin 3.2 (L) 3.5 - 5.0 g/dL   AST 29 15 - 41 U/L   ALT 19 0 - 44 U/L   Alkaline Phosphatase 87 38 - 126 U/L   Total Bilirubin 1.5 (H)  0.0 - 1.2 mg/dL   GFR, Estimated >16 >10 mL/min    Comment: (NOTE) Calculated using the CKD-EPI Creatinine Equation (2021)    Anion gap 16 (H) 5 - 15    Comment: Performed at [facility], [address], [place], [place] [address]  CBC with Differential     Status: Abnormal   Collection Time: 10/31/23  4:59 PM  Result Value Ref Range   WBC 16.6 (H) 4.0 - 10.5 K/uL   RBC 5.22 (H) 3.87 - 5.11 MIL/uL   Hemoglobin 12.9 12.0 - 15.0 g/dL   HCT 40.9 81.1 - 91.4 %   MCV 80.8 80.0 - 100.0 fL   MCH 24.7 (L) 26.0 - 34.0 pg   MCHC 30.6 30.0 - 36.0 g/dL   RDW 78.2 95.6 - 21.3 %   Platelets 253 150 - 400 K/uL   nRBC 0.0 0.0 - 0.2 %   Neutrophils Relative % 86 %   Neutro Abs 14.2 (H) 1.7 - 7.7 K/uL   Lymphocytes Relative 8 %   Lymphs Abs 1.3 0.7 - 4.0 K/uL   Monocytes Relative 5 %   Monocytes Absolute 0.9 0.1 - 1.0 K/uL   Eosinophils Relative 0 %   Eosinophils Absolute 0.0 0.0 - 0.5 K/uL   Basophils Relative 0 %   Basophils Absolute 0.0 0.0 -  0.1 K/uL   Immature Granulocytes 1 %   Abs Immature Granulocytes 0.08 (H) 0.00 - 0.07 K/uL    Comment: Performed at [facility], [address] [place], [place] [address]  Magnesium     Status: Abnormal   Collection Time: 10/31/23  4:59 PM  Result Value Ref Range   Magnesium 1.6 (L) 1.7 - 2.4 mg/dL    Comment: Performed at [facility], [address] [place], [place] [address]  Urinalysis, w/ Reflex to Culture (Infection Suspected) -Urine, Clean Catch     Status: Abnormal   Collection Time: 10/31/23  4:59 PM  Result Value Ref Range   Specimen Source URINE, CLEAN CATCH    Color, Urine YELLOW YELLOW   APPearance HAZY (A) CLEAR   Specific Gravity, Urine 1.010 1.005 - 1.030   pH 6.0 5.0 - 8.0   Glucose, UA NEGATIVE NEGATIVE mg/dL   Hgb urine dipstick MODERATE (A) NEGATIVE   Bilirubin Urine NEGATIVE NEGATIVE   Ketones, ur 20 (A) NEGATIVE mg/dL   Protein, ur NEGATIVE NEGATIVE mg/dL   Nitrite NEGATIVE NEGATIVE   Leukocytes,Ua NEGATIVE NEGATIVE   RBC / HPF 21-50 0 - 5 RBC/hpf   WBC, UA 0-5 0 - 5 WBC/hpf    Comment:        Reflex urine culture not performed if WBC <=10, OR if Squamous epithelial cells >5. If Squamous epithelial cells >5 suggest recollection.    Bacteria, UA MANY (A) NONE SEEN   Squamous Epithelial / HPF 0-5 0 - 5 /HPF   Mucus PRESENT     Comment: Performed at [facility], [address] [place], [place] [address]  CBG monitoring, ED     Status: Abnormal   Collection Time: 10/31/23  5:15 PM  Result Value Ref Range   Glucose-Capillary 114 (H) 70 - 99 mg/dL    Comment: Glucose reference range applies only to samples taken after fasting for at least 8 hours.  I-Stat Lactic Acid     Status: Abnormal   Collection Time: 10/31/23  5:26 PM  Result Value Ref Range   Lactic Acid, Venous 3.7 (  HH) 0.5 - 1.9 mmol/L   Comment NOTIFIED PHYSICIAN   I-Stat Lactic Acid     Status: None   Collection Time: 10/31/23  9:56 PM  Result Value Ref Range   Lactic  Acid, Venous 1.2 0.5 - 1.9 mmol/L   DG Chest Portable 1 View Result Date: 10/31/2023 CLINICAL DATA:  Difficulty breathing EXAM: PORTABLE CHEST 1 VIEW COMPARISON:  05/12/2021 FINDINGS: Cardiac shadow is within normal limits. The lungs are well aerated bilaterally. No focal infiltrate or effusion is seen. No bony abnormality is noted. IMPRESSION: No acute abnormality seen. Electronically Signed   By: Violeta Grey M.D.   On: 10/31/2023 21:43   CT Head Wo Contrast Result Date: 10/31/2023 CLINICAL DATA:  Seizure, new onset, no history of trauma EXAM: CT HEAD WITHOUT CONTRAST TECHNIQUE: Contiguous axial images were obtained from the base of the skull through the vertex without intravenous contrast. RADIATION DOSE REDUCTION: This exam was performed according to the departmental dose-optimization program which includes automated exposure control, adjustment of the mA and/or kV according to patient size and/or use of iterative reconstruction technique. COMPARISON:  CT head 03/25/2021 FINDINGS: Brain: No intracranial hemorrhage, mass effect, or evidence of acute infarct. No hydrocephalus. No extra-axial fluid collection. Vascular: No hyperdense vessel or unexpected calcification. Skull: No fracture or focal lesion. Sinuses/Orbits: Marked mucosal thickening in the left maxillary sinus with air-fluid level. Left greater than right mucosal thickening in the ethmoid air cells and frontal sinuses. No mastoid effusion. Other: None. IMPRESSION: 1. No acute intracranial abnormality. 2. Paranasal sinus mucosal thickening greatest in the left maxillary sinus with air-fluid level. Correlate for acute sinusitis. Electronically Signed   By: Rozell Cornet M.D.   On: 10/31/2023 20:05   CT ABDOMEN PELVIS W CONTRAST Result Date: 10/31/2023 CLINICAL DATA:  Sepsis, left upper quadrant pain, hematuria EXAM: CT ABDOMEN AND PELVIS WITH CONTRAST TECHNIQUE: Multidetector CT imaging of the abdomen and pelvis was performed using the standard  protocol following bolus administration of intravenous contrast. RADIATION DOSE REDUCTION: This exam was performed according to the departmental dose-optimization program which includes automated exposure control, adjustment of the mA and/or kV according to patient size and/or use of iterative reconstruction technique. CONTRAST:  85mL OMNIPAQUE IOHEXOL 350 MG/ML SOLN COMPARISON:  None Available. FINDINGS: Lower chest: No acute abnormality Hepatobiliary: No focal hepatic abnormality. Gallbladder unremarkable. Pancreas: No focal abnormality or ductal dilatation. Spleen: No focal abnormality.  Normal size. Adrenals/Urinary Tract: No adrenal abnormality. No focal renal abnormality. No stones or hydronephrosis. Urinary bladder is unremarkable. Stomach/Bowel: Normal appendix. Prior gastric sleeve. Small hiatal hernia. No bowel obstruction or inflammatory process. Vascular/Lymphatic: Aortic atherosclerosis. No evidence of aneurysm or adenopathy. Reproductive: Endometrium thickened for post cholecystectomy state, 14 mm. Uterus appears diffusely enlarged. This may be related to fibroids, but no well-defined fibroid visible by CT. No adnexal mass. Other: Trace free fluid in the cul-de-sac. No free air. Small umbilical hernia containing fat. Musculoskeletal: No acute bony abnormality. IMPRESSION: Thickened endometrium, 14 mm. Uterus appears diffusely enlarged without well-defined fibroid. Cannot exclude endometrial cancer and/or infiltrating process. Consider further evaluation with pelvic ultrasound or MRI. Prior gastric sleeve. Aortic atherosclerosis. Small umbilical hernia containing fat. Electronically Signed   By: Janeece Mechanic M.D.   On: 10/31/2023 20:05    Review Of Systems Constitutional: No fever, chills, weight loss or gain. Eyes: No vision change, wears glasses. No discharge or pain. Ears: No hearing loss, No tinnitus. Respiratory: No asthma, COPD, pneumonias. No shortness of breath. No  hemoptysis. Cardiovascular: No chest  pain, positive palpitation, leg edema. Gastrointestinal: No nausea, vomiting, diarrhea, constipation. No GI bleed. No hepatitis. Genitourinary: No dysuria, positive hematuria, no kidney stone. No incontinance. Neurological: No headache, stroke, positive seizures.  Psychiatry: No psych facility admission for anxiety, depression, suicide. No detox. Skin: No rash. Musculoskeletal: Positive joint pain, no fibromyalgia. No neck pain, back pain. Lymphadenopathy: No lymphadenopathy. Hematology: No anemia or easy bruising.   Blood pressure (!) 97/53, pulse (!) 132, temperature 98 F (36.7 C), temperature source Oral, resp. rate (!) 24, height 5\' 8"  (1.727 m), weight 113.4 kg, SpO2 99%. Body mass index is 38.01 kg/m. General appearance: alert, cooperative, appears stated age and no distress Head: Normocephalic, atraumatic. Eyes: Brown/Blue/Hazel eyes, pink conjunctiva, corneas clear. PERRL, EOM's intact. Neck: No adenopathy, no carotid bruit, no JVD, supple, symmetrical, trachea midline and thyroid not enlarged. Resp: Clear to auscultation bilaterally. Cardio: Regular rate and rhythm, S1, S2 normal, II/VI systolic murmur, no click, rub or gallop GI: Soft, non-tender; bowel sounds normal; no organomegaly. Extremities: No edema, cyanosis or clubbing. Skin: Warm and dry.  Neurologic: Alert and oriented X 3, normal strength. Normal coordination and gait.  Assessment/Plan Multifocal atrial tachycardia Seizure disorder Type 2 DM HTN HLD Type 2 DM  Plan: Start diltiazem drip. Echocardiogram when stable. Awaiting MRI of brain. Add oral beta-blocker or amiodarone if needed.   Time spent: Review of old records, Lab, x-rays, EKG, other cardiac tests, examination, discussion with patient/Doctor/Nurse over 70 minutes.  Darrold Emms, MD  10/31/2023, 10:17 PM

## 2023-10-31 NOTE — ED Notes (Signed)
Lab called to add on T4 and TSH.
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
## 2023-10-31 NOTE — ED Triage Notes (Signed)
 Pt BIB EMS. Pt had witnesses seizure by coworker, approx. 2 min. GCS 13, responds to verbal stimuli. Unknown history.

## 2023-11-01 ENCOUNTER — Inpatient Hospital Stay (HOSPITAL_COMMUNITY)

## 2023-11-01 ENCOUNTER — Observation Stay (HOSPITAL_COMMUNITY)

## 2023-11-01 DIAGNOSIS — Z7985 Long-term (current) use of injectable non-insulin antidiabetic drugs: Secondary | ICD-10-CM | POA: Diagnosis not present

## 2023-11-01 DIAGNOSIS — I4719 Other supraventricular tachycardia: Secondary | ICD-10-CM | POA: Diagnosis present

## 2023-11-01 DIAGNOSIS — Z8249 Family history of ischemic heart disease and other diseases of the circulatory system: Secondary | ICD-10-CM | POA: Diagnosis not present

## 2023-11-01 DIAGNOSIS — I6782 Cerebral ischemia: Secondary | ICD-10-CM | POA: Diagnosis not present

## 2023-11-01 DIAGNOSIS — Z6838 Body mass index (BMI) 38.0-38.9, adult: Secondary | ICD-10-CM | POA: Diagnosis not present

## 2023-11-01 DIAGNOSIS — E66812 Obesity, class 2: Secondary | ICD-10-CM | POA: Diagnosis present

## 2023-11-01 DIAGNOSIS — E785 Hyperlipidemia, unspecified: Secondary | ICD-10-CM | POA: Diagnosis present

## 2023-11-01 DIAGNOSIS — M4722 Other spondylosis with radiculopathy, cervical region: Secondary | ICD-10-CM | POA: Diagnosis present

## 2023-11-01 DIAGNOSIS — R569 Unspecified convulsions: Secondary | ICD-10-CM | POA: Diagnosis not present

## 2023-11-01 DIAGNOSIS — D509 Iron deficiency anemia, unspecified: Secondary | ICD-10-CM | POA: Diagnosis present

## 2023-11-01 DIAGNOSIS — Z79899 Other long term (current) drug therapy: Secondary | ICD-10-CM | POA: Diagnosis not present

## 2023-11-01 DIAGNOSIS — R6510 Systemic inflammatory response syndrome (SIRS) of non-infectious origin without acute organ dysfunction: Secondary | ICD-10-CM | POA: Diagnosis present

## 2023-11-01 DIAGNOSIS — Z9884 Bariatric surgery status: Secondary | ICD-10-CM | POA: Diagnosis not present

## 2023-11-01 DIAGNOSIS — R9389 Abnormal findings on diagnostic imaging of other specified body structures: Secondary | ICD-10-CM | POA: Diagnosis present

## 2023-11-01 DIAGNOSIS — E119 Type 2 diabetes mellitus without complications: Secondary | ICD-10-CM | POA: Diagnosis present

## 2023-11-01 DIAGNOSIS — G8929 Other chronic pain: Secondary | ICD-10-CM | POA: Diagnosis present

## 2023-11-01 DIAGNOSIS — G40409 Other generalized epilepsy and epileptic syndromes, not intractable, without status epilepticus: Secondary | ICD-10-CM | POA: Diagnosis present

## 2023-11-01 DIAGNOSIS — R319 Hematuria, unspecified: Secondary | ICD-10-CM | POA: Diagnosis present

## 2023-11-01 DIAGNOSIS — M170 Bilateral primary osteoarthritis of knee: Secondary | ICD-10-CM | POA: Diagnosis present

## 2023-11-01 DIAGNOSIS — E860 Dehydration: Secondary | ICD-10-CM | POA: Diagnosis present

## 2023-11-01 DIAGNOSIS — I10 Essential (primary) hypertension: Secondary | ICD-10-CM | POA: Diagnosis present

## 2023-11-01 DIAGNOSIS — E876 Hypokalemia: Secondary | ICD-10-CM | POA: Diagnosis present

## 2023-11-01 LAB — BASIC METABOLIC PANEL WITH GFR
Anion gap: 9 (ref 5–15)
BUN: 9 mg/dL (ref 8–23)
CO2: 21 mmol/L — ABNORMAL LOW (ref 22–32)
Calcium: 8.4 mg/dL — ABNORMAL LOW (ref 8.9–10.3)
Chloride: 111 mmol/L (ref 98–111)
Creatinine, Ser: 0.78 mg/dL (ref 0.44–1.00)
GFR, Estimated: >60 mL/min (ref >60)
Glucose, Bld: 112 mg/dL — ABNORMAL HIGH (ref 70–99)
Potassium: 3.3 mmol/L — ABNORMAL LOW (ref 3.5–5.1)
Sodium: 141 mmol/L (ref 135–145)

## 2023-11-01 LAB — CBC
HCT: 34.4 % — ABNORMAL LOW (ref 36.0–46.0)
Hemoglobin: 10.6 g/dL — ABNORMAL LOW (ref 12.0–15.0)
MCH: 25 pg — ABNORMAL LOW (ref 26.0–34.0)
MCHC: 30.8 g/dL (ref 30.0–36.0)
MCV: 81.1 fL (ref 80.0–100.0)
Platelets: 250 10*3/uL (ref 150–400)
RBC: 4.24 MIL/uL (ref 3.87–5.11)
RDW: 15 % (ref 11.5–15.5)
WBC: 17.7 10*3/uL — ABNORMAL HIGH (ref 4.0–10.5)
nRBC: 0 % (ref 0.0–0.2)

## 2023-11-01 LAB — PHOSPHORUS: Phosphorus: 2.9 mg/dL (ref 2.5–4.6)

## 2023-11-01 LAB — GLUCOSE, CAPILLARY: Glucose-Capillary: 134 mg/dL — ABNORMAL HIGH (ref 70–99)

## 2023-11-01 LAB — MAGNESIUM: Magnesium: 1.8 mg/dL (ref 1.7–2.4)

## 2023-11-01 LAB — T4, FREE: Free T4: 1.25 ng/dL — ABNORMAL HIGH (ref 0.61–1.12)

## 2023-11-01 LAB — HIV ANTIBODY (ROUTINE TESTING W REFLEX): HIV Screen 4th Generation wRfx: NONREACTIVE

## 2023-11-01 LAB — TSH: TSH: 0.623 u[IU]/mL (ref 0.350–4.500)

## 2023-11-01 MED ORDER — IOHEXOL 350 MG/ML SOLN
75.0000 mL | Freq: Once | INTRAVENOUS | Status: AC | PRN
  Administered 2023-11-01: 75 mL via INTRAVENOUS

## 2023-11-01 MED ORDER — MAGNESIUM SULFATE 2 GM/50ML IV SOLN
2.0000 g | Freq: Once | INTRAVENOUS | Status: AC
Start: 2023-11-01 — End: 2023-11-01
  Administered 2023-11-01: 2 g via INTRAVENOUS
  Filled 2023-11-01: qty 50

## 2023-11-01 MED ORDER — DILTIAZEM HCL 30 MG PO TABS
60.0000 mg | ORAL_TABLET | Freq: Three times a day (TID) | ORAL | Status: DC
  Administered 2023-11-01 – 2023-11-03 (×5): 60 mg via ORAL
  Filled 2023-11-01 (×5): qty 2

## 2023-11-01 MED ORDER — METOPROLOL TARTRATE 12.5 MG HALF TABLET: 12.5000 mg | ORAL_TABLET | Freq: Two times a day (BID) | ORAL | Status: DC

## 2023-11-01 MED ORDER — LACTATED RINGERS IV SOLN
INTRAVENOUS | Status: AC
Start: 2023-11-01 — End: 2023-11-01

## 2023-11-01 MED ORDER — METOPROLOL TARTRATE 25 MG PO TABS
25.0000 mg | ORAL_TABLET | Freq: Two times a day (BID) | ORAL | Status: DC
  Administered 2023-11-01: 25 mg via ORAL
  Filled 2023-11-01: qty 1

## 2023-11-01 MED ORDER — POTASSIUM CHLORIDE CRYS ER 10 MEQ PO TBCR
20.0000 meq | EXTENDED_RELEASE_TABLET | Freq: Three times a day (TID) | ORAL | Status: DC
Start: 2023-11-01 — End: 2023-11-02
  Administered 2023-11-01 – 2023-11-02 (×4): 20 meq via ORAL
  Filled 2023-11-01 (×4): qty 2

## 2023-11-01 MED ORDER — LABETALOL HCL 200 MG PO TABS
100.0000 mg | ORAL_TABLET | Freq: Three times a day (TID) | ORAL | Status: DC
  Administered 2023-11-01: 100 mg via ORAL
  Filled 2023-11-01 (×2): qty 1

## 2023-11-01 MED ORDER — METOPROLOL TARTRATE 5 MG/5ML IV SOLN
5.0000 mg | Freq: Once | INTRAVENOUS | Status: AC
Start: 2023-11-01 — End: 2023-11-01
  Administered 2023-11-01: 5 mg via INTRAVENOUS
  Filled 2023-11-01: qty 5

## 2023-11-01 NOTE — Consult Note (Signed)
 Ref: [facility]   Subjective:  Awake. VS improving. HR in 100-120 as sinus tachycardia with occasional APCs. Mild hypokalemia. TSH low normal and T4 minimally elevated. Good LV systolic function on echocardiogram. Patient reports past episodes of palpitations but not treated. MRI brain negative of acute intracranial process but positive for acute sinusitis.  Objective:  Vital Signs in the last 24 hours: Temp:  [98 F (36.7 C)-99.3 F (37.4 C)] 98.1 F (36.7 C) (05/06 0853) Pulse Rate:  [110-132] 117 (05/06 0845) Cardiac Rhythm: Sinus tachycardia (05/05 2240) Resp:  [19-34] 23 (05/06 0845) BP: (90-161)/(53-143) 113/75 (05/06 0845) SpO2:  [97 %-100 %] 100 % (05/06 0845) Weight:  [113.4 kg] 113.4 kg (05/05 1616)  Physical Exam: BP Readings from Last 1 Encounters:  11/01/23 113/75     Wt Readings from Last 1 Encounters:  10/31/23 113.4 kg    Weight change:  Body mass index is 38.01 kg/m. HEENT: [place]/AT, Eyes-Brown, Conjunctiva-Pale pink, Sclera-Non-icteric Neck: No JVD, No bruit, Trachea midline. Lungs:  Clear, Bilateral. Cardiac:  Regular rhythm, normal S1 and S2, no S3. II/VI systolic murmur. Abdomen:  Soft, non-tender. BS present. Extremities:  No edema present. No cyanosis. No clubbing. CNS: AxOx3, Cranial nerves grossly intact, moves all 4 extremities.  Skin: Warm and dry.   Intake/Output from previous day: 05/05 0701 - 05/06 0700 In: 1106.4 [I.V.:8.4; IV Piggyback:1098.1] Out: -     Lab Results: BMET    Component Value Date/Time   NA 141 11/01/2023 0444   NA 139 10/31/2023 1659   NA 136 03/18/2023 0354   NA 140 12/14/2021 1656   NA 142 06/02/2021 1336   NA 141 05/18/2021 1206   K 3.3 (L) 11/01/2023 0444   K 3.8 10/31/2023 1659   K 3.5 03/18/2023 0354   CL 111 11/01/2023 0444   CL 105 10/31/2023 1659   CL 108 03/18/2023 0354   CO2 21 (L) 11/01/2023 0444   CO2 18 (L) 10/31/2023 1659   CO2 22 03/18/2023 0354   GLUCOSE 112 (H)  11/01/2023 0444   GLUCOSE 115 (H) 10/31/2023 1659   GLUCOSE 89 03/18/2023 0354   BUN 9 11/01/2023 0444   BUN 11 10/31/2023 1659   BUN 9 03/18/2023 0354   BUN 14 12/14/2021 1656   BUN 19 06/02/2021 1336   BUN 15 05/18/2021 1206   CREATININE 0.78 11/01/2023 0444   CREATININE 0.76 10/31/2023 1659   CREATININE 0.65 03/18/2023 0354   CREATININE 1.03 07/23/2016 1108   CREATININE 0.77 02/06/2015 0929   CREATININE 0.79 05/06/2014 1114   CALCIUM  8.4 (L) 11/01/2023 0444   CALCIUM  9.4 10/31/2023 1659   CALCIUM  8.7 (L) 03/18/2023 0354   GFRNONAA >60 11/01/2023 0444   GFRNONAA >60 10/31/2023 1659   GFRNONAA >60 03/18/2023 0354   GFRNONAA 61 07/23/2016 1108   GFRAA 113 05/15/2018 1123   GFRAA >60 03/09/2018 0710   GFRAA 108 02/10/2018 0939   GFRAA 71 07/23/2016 1108   CBC    Component Value Date/Time   WBC 17.7 (H) 11/01/2023 0444   RBC 4.24 11/01/2023 0444   HGB 10.6 (L) 11/01/2023 0444   HGB 11.9 12/14/2021 1656   HCT 34.4 (L) 11/01/2023 0444   HCT 38.6 12/14/2021 1656   PLT 250 11/01/2023 0444   PLT 287 12/14/2021 1656   MCV 81.1 11/01/2023 0444   MCV 76 (L) 12/14/2021 1656   MCH 25.0 (L) 11/01/2023 0444   MCHC 30.8 11/01/2023 0444   RDW 15.0 11/01/2023 0444  RDW 14.6 12/14/2021 1656   LYMPHSABS 1.3 10/31/2023 1659   LYMPHSABS 1.9 05/18/2021 1206   MONOABS 0.9 10/31/2023 1659   EOSABS 0.0 10/31/2023 1659   EOSABS 0.2 05/18/2021 1206   BASOSABS 0.0 10/31/2023 1659   BASOSABS 0.0 05/18/2021 1206   HEPATIC Function Panel Recent Labs    03/16/23 2012 03/17/23 0446 10/31/23 1659  PROT 8.4* 7.6 7.7  ALBUMIN 3.4* 3.1* 3.2*  AST 19 16 29   ALT 14 12 19   ALKPHOS 67 60 87   HEMOGLOBIN A1C Lab Results  Component Value Date   MPG 111.15 03/17/2023   CARDIAC ENZYMES Lab Results  Component Value Date   CKTOTAL 177 01/20/2008   CKMB 3.3 01/20/2008   BNP No results for input(s): "PROBNP" in the last 8760 hours. TSH Recent Labs    11/01/23 0444  TSH 0.623    CHOLESTEROL No results for input(s): "CHOL" in the last 8760 hours.  Scheduled Meds:  enoxaparin  (LOVENOX ) injection  40 mg Subcutaneous Q24H   levETIRAcetam  500 mg Oral BID   potassium chloride  20 mEq Oral TID   sodium chloride  flush  3 mL Intravenous Q12H   Continuous Infusions:  diltiazem (CARDIZEM) infusion 10 mg/hr (11/01/23 0027)   lactated ringers      magnesium sulfate bolus IVPB 2 g (11/01/23 0918)   PRN Meds:.acetaminophen  **OR** acetaminophen , oxyCODONE , prochlorperazine , senna-docusate  Assessment/Plan: Sinus tachycardia, probably due to catecholamine surge post seizures Seizure disorder Type 2 DM Hypokalemia HTN HLD  Plan: Add oral metoprolol  for HR control.   LOS: 0 days   Time spent including chart review, lab review, examination, discussion with patient/Nurse/Tech/Family : 30 min   Valerie Bone  MD  11/01/2023, 9:56 AM

## 2023-11-01 NOTE — Progress Notes (Signed)
 Brief Neuro note:  Reviewed MRI Brain and rEEG report and is negative for seizures or clear epileptiform lesion. Neurology will signoff. Continue Keppra 500mg  BID.  Please see full consult note by Dr. Alecia Ames from yesterday for further details. 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  Plan discussed with Dr. Nichole Barker over secure chat. We will signoff. Please feel free to contact us  with any questions or concerns.  Ashyr Hedgepath [facility]

## 2023-11-01 NOTE — ED Notes (Signed)
 MD messaged regarding current HR and permission for admin of labetelol.

## 2023-11-01 NOTE — Progress Notes (Signed)
 Patient admitted to 3W15.  Patient alert and orientec x 4 on room air.   Patient resting comfortable in bed with menu.

## 2023-11-01 NOTE — Progress Notes (Signed)
 EEG complete - results pending

## 2023-11-01 NOTE — ED Notes (Signed)
 MD notified of patient's conversion to SR. New EKG uploaded in chart.

## 2023-11-01 NOTE — Procedures (Signed)
 Patient Name: Valerie Carter  MRN: [id number]  Epilepsy Attending: [name]  Referring Physician/Provider: Augustin Leber, MD  Date: 11/01/2023  Duration: 25.16 mins  Patient history: 63 y.o. female with a history of a single previous episode highly concerning for seizure who presents with a seizure witnessed by coworker. EEG to evaluate for seizure  Level of alertness: Awake, asleep  AEDs during EEG study: LEV  Technical aspects: This EEG study was done with scalp electrodes positioned according to the 10-20 International system of electrode placement. Electrical activity was reviewed with band pass filter of 1-70Hz , sensitivity of 7 uV/mm, display speed of 63mm/sec with a 60Hz  notched filter applied as appropriate. EEG data were recorded continuously and digitally stored.  Video monitoring was available and reviewed as appropriate.  Description: The posterior dominant rhythm consists of 8 Hz activity of moderate voltage (25-35 uV) seen predominantly in posterior head regions, symmetric and reactive to eye opening and eye closing. Sleep was characterized by vertex waves, sleep spindles (12 to 14 Hz), maximal frontocentral region. Physiologic photic driving was not seen during photic stimulation.  Hyperventilation was not performed.     IMPRESSION: This study is within normal limits. No seizures or epileptiform discharges were seen throughout the recording.  A normal interictal EEG does not exclude the diagnosis of epilepsy.  Valerie Carter

## 2023-11-01 NOTE — Progress Notes (Signed)
 Patient not available for EEG, due to being at imaging.  Will check back for availability as schedule allows.

## 2023-11-01 NOTE — Progress Notes (Signed)
 [facility]                                                                               Valerie Carter, is a 63 y.o. female, DOB - Sep 28, 1960, ZOX:[id number] Admit date - 10/31/2023    Outpatient Primary MD for the patient is [facility]  LOS - 0  days    Brief summary   Valerie Carter is a 63 y.o. female with medical history significant for hypertension, hyperlipidemia, type 2 diabetes mellitus, and 1 prior seizure-like episode who presents after a witnessed seizure.  She was found to be in atrial tachycardia.  Cardiology and neurology were consulted by the ED physician and the patient was given IV Keppra, adenosine, Lopressor , diltiazem, IV magnesium, 2 L NS, and was started on diltiazem infusion.   Assessment & Plan    Assessment and Plan:     Seizures MRI brain without contrast.  Spot EEG is negative.  Started on Keppra with 500 mg BID.  Patient has a commercial DL and drives a bus. She is aware of the stricter requirement for reinstatement after a seizure.  She should reach out to [facility] to discuss the requirements for reinstatement of CDL in the setting of a seizure.   Atrial tachycardia with some dizziness.  Echocardiogram ordered Cardiology consulted.  Currently on low dose of metoprolol  12.5 mg BID.    Hypomagnesemia Replaced. Repeat in am.    Hypertension Well controlled.    Type 2 Diabetes mellitus:  Last A1c is 5.5% Recheck A1c. Diet controlled .     Hypokalemia:  Replaced . Repeat in am.    Leukocytosis  Unclear etiology.  UA shows many bacteria. Neg leukocytes and nitrites.  Recheck cbc in am.     Elevated d dimer Check ct angio of the chest.    Bilateral shooting pain in the neck and shoulder going on for many months.  Will get CT cervical spine.   Endometrial thickening  Noted on CT in ED  Will need further imaging with MRI or US , can be done outpatient   Estimated body mass index is 38.01  kg/m as calculated from the following:   Height as of this encounter: 5\' 8"  (1.727 m).   Weight as of this encounter: 113.4 kg.  Code Status: full code.  DVT Prophylaxis:  enoxaparin  (LOVENOX ) injection 40 mg Start: 10/31/23 2315   Level of Care: Level of care: Progressive Family Communication: none at bedside.   Disposition Plan:     Remains inpatient appropriate:  pending clinical improvement.   Procedures:  Echocardiogram.   Consultants:   Cardiology Neurology.   Antimicrobials:   Anti-infectives (From admission, onward)    Start     Dose/Rate Route Frequency Ordered Stop   10/31/23 1930  piperacillin-tazobactam (ZOSYN) IVPB 3.375 g        3.375 g 100 mL/hr over 30 Minutes Intravenous  Once 10/31/23 1925 10/31/23 2026        Medications  Scheduled Meds:  enoxaparin  (LOVENOX ) injection  40 mg Subcutaneous Q24H   levETIRAcetam  500 mg Oral BID   metoprolol  tartrate  12.5 mg Oral BID   potassium  chloride  20 mEq Oral TID   sodium chloride  flush  3 mL Intravenous Q12H   Continuous Infusions:  lactated ringers  75 mL/hr at 11/01/23 1032   PRN Meds:.acetaminophen  **OR** acetaminophen , oxyCODONE , prochlorperazine , senna-docusate    Subjective:   Valerie Carter was seen and examined today.   Bilateral shooting pain from shoulder down to the arm.   Objective:   Vitals:   11/01/23 1240 11/01/23 1300 11/01/23 1512 11/01/23 1600  BP: 105/75 95/65 108/65 129/84  Pulse: (!) 111 (!) 109 85 (!) 114  Resp: 18 18 20 18   Temp: 98.6 F (37 C) 98.8 F (37.1 C) 99.5 F (37.5 C) 99.8 F (37.7 C)  TempSrc: Oral Oral Oral Oral  SpO2: 100% 99% 100% 99%  Weight:      Height:        Intake/Output Summary (Last 24 hours) at 11/01/2023 1628 Last data filed at 11/01/2023 1600 Gross per 24 hour  Intake 1445.91 ml  Output --  Net 1445.91 ml   Filed Weights   10/31/23 1616  Weight: 113.4 kg     Exam General: Alert and oriented x 3, NAD Cardiovascular: S1 S2  auscultated, no murmurs, tachycardic. Respiratory: Clear to auscultation bilaterally, no wheezing, rales or rhonchi Gastrointestinal: Soft, nontender, nondistended, + bowel sounds Ext: no pedal edema bilaterally Neuro: AAOx3, Strength 5/5 upper and lower extremities bilaterally Skin: No rashes Psych: Normal affect and demeanor, alert and oriented x3    Data Reviewed:  I have personally reviewed following labs and imaging studies   CBC Lab Results  Component Value Date   WBC 17.7 (H) 11/01/2023   RBC 4.24 11/01/2023   HGB 10.6 (L) 11/01/2023   HCT 34.4 (L) 11/01/2023   MCV 81.1 11/01/2023   MCH 25.0 (L) 11/01/2023   PLT 250 11/01/2023   MCHC 30.8 11/01/2023   RDW 15.0 11/01/2023   LYMPHSABS 1.3 10/31/2023   MONOABS 0.9 10/31/2023   EOSABS 0.0 10/31/2023   BASOSABS 0.0 10/31/2023     Last metabolic panel Lab Results  Component Value Date   NA 141 11/01/2023   K 3.3 (L) 11/01/2023   CL 111 11/01/2023   CO2 21 (L) 11/01/2023   BUN 9 11/01/2023   CREATININE 0.78 11/01/2023   GLUCOSE 112 (H) 11/01/2023   GFRNONAA >60 11/01/2023   GFRAA 113 05/15/2018   CALCIUM  8.4 (L) 11/01/2023   PHOS 2.9 11/01/2023   PROT 7.7 10/31/2023   ALBUMIN 3.2 (L) 10/31/2023   LABGLOB 3.9 12/14/2021   AGRATIO 1.0 (L) 12/14/2021   BILITOT 1.5 (H) 10/31/2023   ALKPHOS 87 10/31/2023   AST 29 10/31/2023   ALT 19 10/31/2023   ANIONGAP 9 11/01/2023    CBG (last 3)  Recent Labs    10/31/23 1715  GLUCAP 114*      Coagulation Profile: No results for input(s): "INR", "PROTIME" in the last 168 hours.   Radiology Studies: EEG adult Result Date: 11/01/2023 Valerie Lack, MD     11/01/2023  8:40 AM Patient Name: Valerie Carter MRN: [id number] Epilepsy Attending: Arleene Carter Referring Physician/Provider: Augustin Leber, MD Date: 11/01/2023 Duration: 25.16 mins Patient history: 63 y.o. female with a history of a single previous episode highly concerning for seizure who presents  with a seizure witnessed by coworker. EEG to evaluate for seizure Level of alertness: Awake, asleep AEDs during EEG study: LEV Technical aspects: This EEG study was done with scalp electrodes positioned according to the 10-20 International system of electrode placement.  Electrical activity was reviewed with band pass filter of 1-70Hz , sensitivity of 7 uV/mm, display speed of 21mm/sec with a 60Hz  notched filter applied as appropriate. EEG data were recorded continuously and digitally stored.  Video monitoring was available and reviewed as appropriate. Description: The posterior dominant rhythm consists of 8 Hz activity of moderate voltage (25-35 uV) seen predominantly in posterior head regions, symmetric and reactive to eye opening and eye closing. Sleep was characterized by vertex waves, sleep spindles (12 to 14 Hz), maximal frontocentral region. Physiologic photic driving was not seen during photic stimulation.  Hyperventilation was not performed.   IMPRESSION: This study is within normal limits. No seizures or epileptiform discharges were seen throughout the recording. A normal interictal EEG does not exclude the diagnosis of epilepsy. Valerie Carter   MR BRAIN WO CONTRAST Result Date: 11/01/2023 CLINICAL DATA:  Seizure, new-onset, no history of trauma EXAM: MRI HEAD WITHOUT CONTRAST TECHNIQUE: Multiplanar, multiecho pulse sequences of the brain and surrounding structures were obtained without intravenous contrast. COMPARISON:  CT head Oct 31, 2023. FINDINGS: Brain: No acute infarction, hemorrhage, hydrocephalus, extra-axial collection or mass lesion. Mild to moderate scattered T2/FLAIR hyperintensities in the white matter, which are nonspecific but compatible with chronic microvascular ischemic change. The hippocampi are symmetric and within normal limits. Vascular: Major arterial flow voids are maintained at the skull base. Skull and upper cervical spine: Normal marrow signal. Sinuses/Orbits: Near-total  opacification of the left maxillary sinus. No acute orbital findings. Other: No mastoid effusions. IMPRESSION: 1. No evidence of acute intracranial abnormality. 2. Mild to moderate chronic microvascular ischemic change. 3. Near-total opacification of the left maxillary sinus. Electronically Signed   By: Stevenson Elbe M.D.   On: 11/01/2023 01:31   DG Chest Portable 1 View Result Date: 10/31/2023 CLINICAL DATA:  Difficulty breathing EXAM: PORTABLE CHEST 1 VIEW COMPARISON:  05/12/2021 FINDINGS: Cardiac shadow is within normal limits. The lungs are well aerated bilaterally. No focal infiltrate or effusion is seen. No bony abnormality is noted. IMPRESSION: No acute abnormality seen. Electronically Signed   By: Violeta Grey M.D.   On: 10/31/2023 21:43   CT Head Wo Contrast Result Date: 10/31/2023 CLINICAL DATA:  Seizure, new onset, no history of trauma EXAM: CT HEAD WITHOUT CONTRAST TECHNIQUE: Contiguous axial images were obtained from the base of the skull through the vertex without intravenous contrast. RADIATION DOSE REDUCTION: This exam was performed according to the departmental dose-optimization program which includes automated exposure control, adjustment of the mA and/or kV according to patient size and/or use of iterative reconstruction technique. COMPARISON:  CT head 03/25/2021 FINDINGS: Brain: No intracranial hemorrhage, mass effect, or evidence of acute infarct. No hydrocephalus. No extra-axial fluid collection. Vascular: No hyperdense vessel or unexpected calcification. Skull: No fracture or focal lesion. Sinuses/Orbits: Marked mucosal thickening in the left maxillary sinus with air-fluid level. Left greater than right mucosal thickening in the ethmoid air cells and frontal sinuses. No mastoid effusion. Other: None. IMPRESSION: 1. No acute intracranial abnormality. 2. Paranasal sinus mucosal thickening greatest in the left maxillary sinus with air-fluid level. Correlate for acute sinusitis.  Electronically Signed   By: Rozell Cornet M.D.   On: 10/31/2023 20:05   CT ABDOMEN PELVIS W CONTRAST Result Date: 10/31/2023 CLINICAL DATA:  Sepsis, left upper quadrant pain, hematuria EXAM: CT ABDOMEN AND PELVIS WITH CONTRAST TECHNIQUE: Multidetector CT imaging of the abdomen and pelvis was performed using the standard protocol following bolus administration of intravenous contrast. RADIATION DOSE REDUCTION: This exam was performed according to  the departmental dose-optimization program which includes automated exposure control, adjustment of the mA and/or kV according to patient size and/or use of iterative reconstruction technique. CONTRAST:  85mL OMNIPAQUE IOHEXOL 350 MG/ML SOLN COMPARISON:  None Available. FINDINGS: Lower chest: No acute abnormality Hepatobiliary: No focal hepatic abnormality. Gallbladder unremarkable. Pancreas: No focal abnormality or ductal dilatation. Spleen: No focal abnormality.  Normal size. Adrenals/Urinary Tract: No adrenal abnormality. No focal renal abnormality. No stones or hydronephrosis. Urinary bladder is unremarkable. Stomach/Bowel: Normal appendix. Prior gastric sleeve. Small hiatal hernia. No bowel obstruction or inflammatory process. Vascular/Lymphatic: Aortic atherosclerosis. No evidence of aneurysm or adenopathy. Reproductive: Endometrium thickened for post cholecystectomy state, 14 mm. Uterus appears diffusely enlarged. This may be related to fibroids, but no well-defined fibroid visible by CT. No adnexal mass. Other: Trace free fluid in the cul-de-sac. No free air. Small umbilical hernia containing fat. Musculoskeletal: No acute bony abnormality. IMPRESSION: Thickened endometrium, 14 mm. Uterus appears diffusely enlarged without well-defined fibroid. Cannot exclude endometrial cancer and/or infiltrating process. Consider further evaluation with pelvic ultrasound or MRI. Prior gastric sleeve. Aortic atherosclerosis. Small umbilical hernia containing fat. Electronically  Signed   By: Janeece Mechanic M.D.   On: 10/31/2023 20:05       Feliciana Horn M.D. [facility] 11/01/2023, 4:28 PM  Available via Epic secure chat 7am-7pm After 7 pm, please refer to night coverage provider listed on amion.

## 2023-11-01 NOTE — Plan of Care (Signed)

## 2023-11-02 ENCOUNTER — Inpatient Hospital Stay (HOSPITAL_COMMUNITY)

## 2023-11-02 DIAGNOSIS — R569 Unspecified convulsions: Secondary | ICD-10-CM | POA: Diagnosis not present

## 2023-11-02 LAB — BASIC METABOLIC PANEL WITH GFR
Anion gap: 8 (ref 5–15)
BUN: 11 mg/dL (ref 8–23)
CO2: 21 mmol/L — ABNORMAL LOW (ref 22–32)
Calcium: 8.7 mg/dL — ABNORMAL LOW (ref 8.9–10.3)
Chloride: 109 mmol/L (ref 98–111)
Creatinine, Ser: 1.08 mg/dL — ABNORMAL HIGH (ref 0.44–1.00)
GFR, Estimated: 58 mL/min — ABNORMAL LOW (ref >60)
Glucose, Bld: 120 mg/dL — ABNORMAL HIGH (ref 70–99)
Potassium: 4.3 mmol/L (ref 3.5–5.1)
Sodium: 138 mmol/L (ref 135–145)

## 2023-11-02 LAB — MAGNESIUM: Magnesium: 1.8 mg/dL (ref 1.7–2.4)

## 2023-11-02 LAB — CBC
HCT: 33.2 % — ABNORMAL LOW (ref 36.0–46.0)
Hemoglobin: 10.2 g/dL — ABNORMAL LOW (ref 12.0–15.0)
MCH: 24.7 pg — ABNORMAL LOW (ref 26.0–34.0)
MCHC: 30.7 g/dL (ref 30.0–36.0)
MCV: 80.4 fL (ref 80.0–100.0)
Platelets: 226 10*3/uL (ref 150–400)
RBC: 4.13 MIL/uL (ref 3.87–5.11)
RDW: 15.2 % (ref 11.5–15.5)
WBC: 11.5 10*3/uL — ABNORMAL HIGH (ref 4.0–10.5)
nRBC: 0 % (ref 0.0–0.2)

## 2023-11-02 LAB — ECHOCARDIOGRAM COMPLETE
Height: 68 in
S' Lateral: 3.2 cm
Weight: 4000 [oz_av]

## 2023-11-02 LAB — HEMOGLOBIN A1C
Hgb A1c MFr Bld: 5 % (ref 4.8–5.6)
Mean Plasma Glucose: 96.8 mg/dL

## 2023-11-02 MED ORDER — SODIUM CHLORIDE 0.9 % IV SOLN: INTRAVENOUS | Status: DC

## 2023-11-02 MED ORDER — POTASSIUM CHLORIDE CRYS ER 10 MEQ PO TBCR
10.0000 meq | EXTENDED_RELEASE_TABLET | Freq: Two times a day (BID) | ORAL | Status: DC
  Administered 2023-11-02: 10 meq via ORAL
  Filled 2023-11-02: qty 1

## 2023-11-02 MED ORDER — METOPROLOL TARTRATE 25 MG PO TABS
25.0000 mg | ORAL_TABLET | Freq: Four times a day (QID) | ORAL | Status: DC
  Administered 2023-11-02 (×4): 25 mg via ORAL
  Filled 2023-11-02 (×3): qty 1
  Filled 2023-11-02: qty 2

## 2023-11-02 NOTE — Plan of Care (Signed)
  Problem: Education: Goal: Knowledge of General Education information will improve Description Including pain rating scale, medication(s)/side effects and non-pharmacologic comfort measures Outcome: Progressing   Problem: Clinical Measurements: Goal: Respiratory complications will improve Outcome: Progressing Goal: Cardiovascular complication will be avoided Outcome: Progressing   Problem: Elimination: Goal: Will not experience complications related to bowel motility Outcome: Progressing Goal: Will not experience complications related to urinary retention Outcome: Progressing   Problem: Skin Integrity: Goal: Risk for impaired skin integrity will decrease Outcome: Progressing

## 2023-11-02 NOTE — Progress Notes (Signed)
 PROGRESS NOTE    Valerie Carter  AVW:[id number] DOB: 1960-12-30 DOA: 10/31/2023 PCP: [facility]    Brief Narrative:  63 year old with history of hypertension, hyperlipidemia, type 2 diabetes, prior possible seizure episode who presented to the emergency room with 1 episode of witnessed seizure by her husband at home.  Patient was apparently awake all night taking care of her aunt, she had also taken a dose of oxycodone  in the morning, she walked out of the bathroom and collapsed.  Husband noted tonic-clonic seizure.  Patient woke up with EMS.  Noted to have postictal confusion.  Lasted about 2 minutes.  In the emergency room hemodynamically stable.  Found to have atrial tachycardia.  Also has chronic neck pain and radiculopathy to the right arm.   Subjective: Patient seen in the morning rounds.  Denied any complaints.  Later on I was asked to see the patient because she was complaining of severe pain on her right arm radiating from the neck and also some numbness of the arms.  No recurrent events. Telemonitoring still with sinus tachycardia heart rate as high as 120.  Blood pressures are low normal. Case gust with cardiology at the bedside.   Assessment & Plan:   Seizure disorder: Witnessed tonic-clonic seizure. MRI brain, without any acute findings. Spot EEG, without any acute seizure. Seen by neurology.  Suggested Keppra 500 mg twice daily. Currently contraindication to driving.  All-time seizure precautions.  She will have follow-up with neurology as outpatient.  Atrial tachycardia with dizziness: TSH normal. Echocardiogram essentially normal.  Normal ejection fraction. Seen by cardiology, started on Cardizem 60 mg 3 times daily, metoprolol  25 mg 4 times daily today.  Monitoring heart rate.  Mobilize.  Check orthostatic symptoms.  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  No spinal compression.  Pain  medications.  Will send outpatient therapy on discharge. CT angiogram of the chest without evidence of PE.  Electrolytes: Adequate.  Replaced.  Essential hypertension: As above.  Currently not on any blood pressure medications.  Discontinued lisinopril  to make room for rate control medications including Cardizem and metoprolol .   Continue to mobilize today.  Heart rate controlled.  Possible home tomorrow.   DVT prophylaxis: enoxaparin  (LOVENOX ) injection 40 mg Start: 10/31/23 2315   Code Status: Full code Family Communication: Husband on the phone Disposition Plan: Status is: Inpatient Remains inpatient appropriate because: Heart rate control     Consultants:  Neurology Cardiology  Procedures:  None  Antimicrobials:  None     Objective: Vitals:   11/02/23 0848 11/02/23 0945 11/02/23 1125 11/02/23 1241  BP: (!) 88/58 (!) 88/60 96/69 94/68   Pulse: 94  (!) 109 94  Resp:   18 18  Temp: 99.3 F (37.4 C)  99.1 F (37.3 C)   TempSrc: Oral  Oral   SpO2: 97%  97%   Weight:      Height:        Intake/Output Summary (Last 24 hours) at 11/02/2023 1457 Last data filed at 11/01/2023 1600 Gross per 24 hour  Intake 236.78 ml  Output --  Net 236.78 ml   Filed Weights   10/31/23 1616  Weight: 113.4 kg    Examination:  General exam: Appears slightly anxious.  Comfortable at rest.  Has moderate pain on her right arm. Respiratory system: Clear to auscultation. Respiratory effort normal. Cardiovascular system: S1 & S2 heard, RRR.  Tachycardia.  No pedal edema. Gastrointestinal system: Abdomen is nondistended, soft  and nontender. No organomegaly or masses felt. Normal bowel sounds heard. Central nervous system: Alert and oriented. No focal neurological deficits. Extremities: Symmetric 5 x 5 power. Skin: No rashes, lesions or ulcers Psychiatry: Judgement and insight appear normal. Mood & affect appropriate.     Data Reviewed: I have personally reviewed following labs and  imaging studies  CBC: Recent Labs  Lab 10/31/23 1659 11/01/23 0444 11/02/23 0543  WBC 16.6* 17.7* 11.5*  NEUTROABS 14.2*  --   --   HGB 12.9 10.6* 10.2*  HCT 42.2 34.4* 33.2*  MCV 80.8 81.1 80.4  PLT 253 250 226   Basic Metabolic Panel: Recent Labs  Lab 10/31/23 1659 11/01/23 0444 11/02/23 0543  NA 139 141 138  K 3.8 3.3* 4.3  CL 105 111 109  CO2 18* 21* 21*  GLUCOSE 115* 112* 120*  BUN 11 9 11   CREATININE 0.76 0.78 1.08*  CALCIUM  9.4 8.4* 8.7*  MG 1.6* 1.8 1.8  PHOS  --  2.9  --    GFR: Estimated Creatinine Clearance: 71.4 mL/min (A) (by C-G formula based on SCr of 1.08 mg/dL (H)). Liver Function Tests: Recent Labs  Lab 10/31/23 1659  AST 29  ALT 19  ALKPHOS 87  BILITOT 1.5*  PROT 7.7  ALBUMIN 3.2*   No results for input(s): "LIPASE", "AMYLASE" in the last 168 hours. No results for input(s): "AMMONIA" in the last 168 hours. Coagulation Profile: No results for input(s): "INR", "PROTIME" in the last 168 hours. Cardiac Enzymes: No results for input(s): "CKTOTAL", "CKMB", "CKMBINDEX", "TROPONINI" in the last 168 hours. BNP (last 3 results) No results for input(s): "PROBNP" in the last 8760 hours. HbA1C: Recent Labs    11/02/23 0543  HGBA1C 5.0   CBG: Recent Labs  Lab 10/31/23 1715 11/01/23 1642  GLUCAP 114* 134*   Lipid Profile: No results for input(s): "CHOL", "HDL", "LDLCALC", "TRIG", "CHOLHDL", "LDLDIRECT" in the last 72 hours. Thyroid Function Tests: Recent Labs    11/01/23 0444  TSH 0.623  FREET4 1.25*   Anemia Panel: No results for input(s): "VITAMINB12", "FOLATE", "FERRITIN", "TIBC", "IRON ", "RETICCTPCT" in the last 72 hours. Sepsis Labs: Recent Labs  Lab 10/31/23 1726 10/31/23 2156  LATICACIDVEN 3.7* 1.2    No results found for this or any previous visit (from the past 240 hours).       Radiology Studies: ECHOCARDIOGRAM COMPLETE Result Date: 11/02/2023    ECHOCARDIOGRAM REPORT   Patient Name:   Valerie Carter [facility] Date  of Exam: 11/01/2023 Medical Rec #:  [id number]           Height:       68.0 in Accession #:    [id number]          Weight:       250.0 lb Date of Birth:  December 25, 1960           BSA:          2.247 m Patient Age:    62 years            BP:           114/89 mmHg Patient Gender: F                   HR:           110 bpm. Exam Location:  Inpatient Procedure: 2D Echo, Cardiac Doppler and Color Doppler (Both Spectral and Color            Flow Doppler were utilized during  procedure). Indications:     Abnormal ECG  History:         Patient has no prior history of Echocardiogram examinations.                  Risk Factors:Hypertension.  Sonographer:     Janette Medley Referring Phys:  [id number] TIMOTHY S OPYD Diagnosing Phys: Pasqual Bone MD IMPRESSIONS  1. Left ventricular ejection fraction, by estimation, is 55 to 60%. The left ventricle has normal function. The left ventricle has no regional wall motion abnormalities. Left ventricular diastolic parameters are consistent with Grade I diastolic dysfunction (impaired relaxation).  2. Right ventricular systolic function is normal. The right ventricular size is normal.  3. Left atrial size was moderately dilated.  4. Right atrial size was moderately dilated.  5. The mitral valve is degenerative. Trivial mitral valve regurgitation.  6. The aortic valve is tricuspid. Aortic valve regurgitation is trivial. Aortic valve sclerosis is present, with no evidence of aortic valve stenosis.  7. The inferior vena cava is normal in size with greater than 50% respiratory variability, suggesting right atrial pressure of 3 mmHg. FINDINGS  Left Ventricle: Left ventricular ejection fraction, by estimation, is 55 to 60%. The left ventricle has normal function. The left ventricle has no regional wall motion abnormalities. The left ventricular internal cavity size was normal in size. There is  borderline concentric left ventricular hypertrophy. Left ventricular diastolic parameters are consistent with  Grade I diastolic dysfunction (impaired relaxation). Right Ventricle: The right ventricular size is normal. No increase in right ventricular wall thickness. Right ventricular systolic function is normal. Left Atrium: Left atrial size was moderately dilated. Right Atrium: Right atrial size was moderately dilated. Pericardium: There is no evidence of pericardial effusion. Mitral Valve: The mitral valve is degenerative in appearance. There is moderate calcification of the mitral valve leaflet(s). Mild mitral annular calcification. Trivial mitral valve regurgitation. Tricuspid Valve: The tricuspid valve is normal in structure. Tricuspid valve regurgitation is trivial. Aortic Valve: The aortic valve is tricuspid. Aortic valve regurgitation is trivial. Aortic valve sclerosis is present, with no evidence of aortic valve stenosis. Pulmonic Valve: The pulmonic valve was normal in structure. Pulmonic valve regurgitation is not visualized. Aorta: The aortic root is normal in size and structure. There is minimal (Grade I) atheroma plaque involving the aortic root. Venous: The inferior vena cava is normal in size with greater than 50% respiratory variability, suggesting right atrial pressure of 3 mmHg. IAS/Shunts: The atrial septum is grossly normal.  LEFT VENTRICLE PLAX 2D LVIDd:         4.60 cm LVIDs:         3.20 cm LV PW:         1.00 cm LV IVS:        1.00 cm LVOT diam:     2.20 cm LV SV:         74 LV SV Index:   33 LVOT Area:     3.80 cm  RIGHT VENTRICLE             IVC RV S prime:     13.70 cm/s  IVC diam: 1.40 cm TAPSE (M-mode): 2.0 cm LEFT ATRIUM             Index        RIGHT ATRIUM           Index LA Vol (A2C):   85.3 ml 37.96 ml/m  RA Area:     23.90 cm LA  Vol (A4C):   66.8 ml 29.73 ml/m  RA Volume:   76.90 ml  34.22 ml/m LA Biplane Vol: 78.9 ml 35.11 ml/m  AORTIC VALVE LVOT Vmax:   118.00 cm/s LVOT Vmean:  80.500 cm/s LVOT VTI:    0.194 m  AORTA Ao Root diam: 2.50 cm  SHUNTS Systemic VTI:  0.19 m Systemic  Diam: 2.20 cm Pasqual Bone MD Electronically signed by Pasqual Bone MD Signature Date/Time: 11/02/2023/9:25:03 AM    Final    CT CERVICAL SPINE WO CONTRAST Result Date: 11/02/2023 CLINICAL DATA:  Cervical radiculopathy without red flag symptoms EXAM: CT CERVICAL SPINE WITHOUT CONTRAST TECHNIQUE: Multidetector CT imaging of the cervical spine was performed without intravenous contrast. Multiplanar CT image reconstructions were also generated. RADIATION DOSE REDUCTION: This exam was performed according to the departmental dose-optimization program which includes automated exposure control, adjustment of the mA and/or kV according to patient size and/or use of iterative reconstruction technique. COMPARISON:  None Available. FINDINGS: Alignment: Physiologic Skull base and vertebrae: No evidence of fracture or bone lesion. Soft tissues and spinal canal: No evidence of perispinal mass or inflammation. Disc levels: Atlantal dental degenerative spurring with inferior enthesophyte from the anterior arch of C1. Diffusely preserved disc height with mild ventral endplate spurring at C4-5 and C5-6. No significant facet spurring. No visible herniation. No bony impingement. Upper chest: Negative IMPRESSION: No acute finding. Mild degenerative spurring without bony impingement throughout the cervical spine. Electronically Signed   By: Ronnette Coke M.D.   On: 11/02/2023 06:21   CT Angio Chest Pulmonary Embolism (PE) W or WO Contrast Result Date: 11/01/2023 CLINICAL DATA:  Pulmonary embolism (PE) suspected, high prob. Difficulty breathing. EXAM: CT ANGIOGRAPHY CHEST WITH CONTRAST TECHNIQUE: Multidetector CT imaging of the chest was performed using the standard protocol during bolus administration of intravenous contrast. Multiplanar CT image reconstructions and MIPs were obtained to evaluate the vascular anatomy. RADIATION DOSE REDUCTION: This exam was performed according to the departmental dose-optimization program which  includes automated exposure control, adjustment of the mA and/or kV according to patient size and/or use of iterative reconstruction technique. CONTRAST:  75mL OMNIPAQUE IOHEXOL 350 MG/ML SOLN COMPARISON:  None Available. FINDINGS: Cardiovascular: No filling defects in the pulmonary arteries to suggest pulmonary emboli. Heart is normal size. Aorta is normal caliber. Few scattered calcifications in the left anterior descending coronary artery. Mediastinum/Nodes: No mediastinal, hilar, or axillary adenopathy. Trachea and esophagus are unremarkable. Thyroid unremarkable. Lungs/Pleura: Linear subsegmental atelectasis in the lung bases/lower lobes. No confluent opacities or effusions. Upper Abdomen: Small hiatal hernia.  No acute findings. Musculoskeletal: Chest wall soft tissues are unremarkable. No acute bony abnormality. Review of the MIP images confirms the above findings. IMPRESSION: No evidence of pulmonary embolus. Few scattered calcifications in the left anterior descending coronary artery. Bibasilar atelectasis. Electronically Signed   By: Janeece Mechanic M.D.   On: 11/01/2023 18:32   EEG adult Result Date: 11/01/2023 [name], MD     11/01/2023  8:40 AM Patient Name: Shellyann Veenstra MRN: [id number] Epilepsy Attending: [name] Referring Physician/Provider: Augustin Leber, MD Date: 11/01/2023 Duration: 25.16 mins Patient history: 63 y.o. female with a history of a single previous episode highly concerning for seizure who presents with a seizure witnessed by coworker. EEG to evaluate for seizure Level of alertness: Awake, asleep AEDs during EEG study: LEV Technical aspects: This EEG study was done with scalp electrodes positioned according to the 10-20 International system of electrode placement. Electrical activity was reviewed with band  pass filter of 1-70Hz , sensitivity of 7 uV/mm, display speed of 71mm/sec with a 60Hz  notched filter applied as appropriate. EEG data were recorded  continuously and digitally stored.  Video monitoring was available and reviewed as appropriate. Description: The posterior dominant rhythm consists of 8 Hz activity of moderate voltage (25-35 uV) seen predominantly in posterior head regions, symmetric and reactive to eye opening and eye closing. Sleep was characterized by vertex waves, sleep spindles (12 to 14 Hz), maximal frontocentral region. Physiologic photic driving was not seen during photic stimulation.  Hyperventilation was not performed.   IMPRESSION: This study is within normal limits. No seizures or epileptiform discharges were seen throughout the recording. A normal interictal EEG does not exclude the diagnosis of epilepsy. [name]   MR BRAIN WO CONTRAST Result Date: 11/01/2023 CLINICAL DATA:  Seizure, new-onset, no history of trauma EXAM: MRI HEAD WITHOUT CONTRAST TECHNIQUE: Multiplanar, multiecho pulse sequences of the brain and surrounding structures were obtained without intravenous contrast. COMPARISON:  CT head Oct 31, 2023. FINDINGS: Brain: No acute infarction, hemorrhage, hydrocephalus, extra-axial collection or mass lesion. Mild to moderate scattered T2/FLAIR hyperintensities in the white matter, which are nonspecific but compatible with chronic microvascular ischemic change. The hippocampi are symmetric and within normal limits. Vascular: Major arterial flow voids are maintained at the skull base. Skull and upper cervical spine: Normal marrow signal. Sinuses/Orbits: Near-total opacification of the left maxillary sinus. No acute orbital findings. Other: No mastoid effusions. IMPRESSION: 1. No evidence of acute intracranial abnormality. 2. Mild to moderate chronic microvascular ischemic change. 3. Near-total opacification of the left maxillary sinus. Electronically Signed   By: Stevenson Elbe M.D.   On: 11/01/2023 01:31   DG Chest Portable 1 View Result Date: 10/31/2023 CLINICAL DATA:  Difficulty breathing EXAM: PORTABLE CHEST 1  VIEW COMPARISON:  05/12/2021 FINDINGS: Cardiac shadow is within normal limits. The lungs are well aerated bilaterally. No focal infiltrate or effusion is seen. No bony abnormality is noted. IMPRESSION: No acute abnormality seen. Electronically Signed   By: Violeta Grey M.D.   On: 10/31/2023 21:43   CT Head Wo Contrast Result Date: 10/31/2023 CLINICAL DATA:  Seizure, new onset, no history of trauma EXAM: CT HEAD WITHOUT CONTRAST TECHNIQUE: Contiguous axial images were obtained from the base of the skull through the vertex without intravenous contrast. RADIATION DOSE REDUCTION: This exam was performed according to the departmental dose-optimization program which includes automated exposure control, adjustment of the mA and/or kV according to patient size and/or use of iterative reconstruction technique. COMPARISON:  CT head 03/25/2021 FINDINGS: Brain: No intracranial hemorrhage, mass effect, or evidence of acute infarct. No hydrocephalus. No extra-axial fluid collection. Vascular: No hyperdense vessel or unexpected calcification. Skull: No fracture or focal lesion. Sinuses/Orbits: Marked mucosal thickening in the left maxillary sinus with air-fluid level. Left greater than right mucosal thickening in the ethmoid air cells and frontal sinuses. No mastoid effusion. Other: None. IMPRESSION: 1. No acute intracranial abnormality. 2. Paranasal sinus mucosal thickening greatest in the left maxillary sinus with air-fluid level. Correlate for acute sinusitis. Electronically Signed   By: Rozell Cornet M.D.   On: 10/31/2023 20:05   CT ABDOMEN PELVIS W CONTRAST Result Date: 10/31/2023 CLINICAL DATA:  Sepsis, left upper quadrant pain, hematuria EXAM: CT ABDOMEN AND PELVIS WITH CONTRAST TECHNIQUE: Multidetector CT imaging of the abdomen and pelvis was performed using the standard protocol following bolus administration of intravenous contrast. RADIATION DOSE REDUCTION: This exam was performed according to the departmental  dose-optimization program which  includes automated exposure control, adjustment of the mA and/or kV according to patient size and/or use of iterative reconstruction technique. CONTRAST:  85mL OMNIPAQUE IOHEXOL 350 MG/ML SOLN COMPARISON:  None Available. FINDINGS: Lower chest: No acute abnormality Hepatobiliary: No focal hepatic abnormality. Gallbladder unremarkable. Pancreas: No focal abnormality or ductal dilatation. Spleen: No focal abnormality.  Normal size. Adrenals/Urinary Tract: No adrenal abnormality. No focal renal abnormality. No stones or hydronephrosis. Urinary bladder is unremarkable. Stomach/Bowel: Normal appendix. Prior gastric sleeve. Small hiatal hernia. No bowel obstruction or inflammatory process. Vascular/Lymphatic: Aortic atherosclerosis. No evidence of aneurysm or adenopathy. Reproductive: Endometrium thickened for post cholecystectomy state, 14 mm. Uterus appears diffusely enlarged. This may be related to fibroids, but no well-defined fibroid visible by CT. No adnexal mass. Other: Trace free fluid in the cul-de-sac. No free air. Small umbilical hernia containing fat. Musculoskeletal: No acute bony abnormality. IMPRESSION: Thickened endometrium, 14 mm. Uterus appears diffusely enlarged without well-defined fibroid. Cannot exclude endometrial cancer and/or infiltrating process. Consider further evaluation with pelvic ultrasound or MRI. Prior gastric sleeve. Aortic atherosclerosis. Small umbilical hernia containing fat. Electronically Signed   By: Janeece Mechanic M.D.   On: 10/31/2023 20:05        Scheduled Meds:  diltiazem  60 mg Oral Q8H   enoxaparin  (LOVENOX ) injection  40 mg Subcutaneous Q24H   levETIRAcetam  500 mg Oral BID   metoprolol  tartrate  25 mg Oral QID   potassium chloride  10 mEq Oral BID   sodium chloride  flush  3 mL Intravenous Q12H   Continuous Infusions:  sodium chloride  50 mL/hr at 11/02/23 0945     LOS: 1 day      Akemi Overholser, MD [facility]

## 2023-11-02 NOTE — Evaluation (Signed)
 Physical Therapy Evaluation Patient Details Name: Valerie Carter MRN: [id number] DOB: Oct 10, 1960 Today's Date: 11/02/2023  History of Present Illness  Pt is a 62yo female admitted s/p witnessed seizure. PMH:  HTN, HLD, DM II, and 1 prior seizure-like episode  Clinical Impression  Pt admitted with above. 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 Pt mobility currently greatly limited by R knee pain. Pt educated on position of LEs in bed and given ice for R knee. Pt with history of "bad knees." At this time patient unable to stand. I am hopeful once R knee pain is under control pt will be able to ambulate with minimally a rolling walker and she will be able to d/c home with support of spouse. Acute PT to cont to follow.       If plan is discharge home, recommend the following: A lot of help with walking and/or transfers;A little help with bathing/dressing/bathroom;Help with stairs or ramp for entrance;Assist for transportation   Can travel by private vehicle        Equipment Recommendations None recommended by PT  Recommendations for Other Services       Functional Status Assessment Patient has had a recent decline in their functional status and demonstrates the ability to make significant improvements in function in a reasonable and predictable amount of time.     Precautions / Restrictions Precautions Precautions: Fall Precaution/Restrictions Comments: seizures, obesity Restrictions Weight Bearing Restrictions Per Provider Order: No      Mobility  Bed Mobility Overal bed mobility: Needs Assistance Bed Mobility: Supine to Sit, Sit to Supine     Supine to sit: Supervision, HOB elevated, Used rails Sit to supine: Min assist, Used rails   General bed mobility comments: definite use of bed rails, HOB elevated when transferring to EOB, minA for LE management back into bed    Transfers Overall transfer level: Needs assistance Equipment used: Rolling  walker (2 wheels) Transfers: Sit to/from Stand             General transfer comment: attempted several times to stand however unable due to R knee pain, with assist x2 pt able to rock forward enough to scoot towards [facility]    Ambulation/Gait               General Gait Details: unable this date due to R knee pain  Stairs            Wheelchair Mobility     Tilt Bed    Modified Rankin (Stroke Patients Only)       Balance Overall balance assessment: Needs assistance Sitting-balance support: Feet supported, Bilateral upper extremity supported Sitting balance-Leahy Scale: Fair     Standing balance support: Bilateral upper extremity supported Standing balance-Leahy Scale: Zero Standing balance comment: unable due to pain                             Pertinent Vitals/Pain Pain Assessment Pain Assessment: Faces Faces Pain Scale: Hurts worst Pain Location: R knee - unable to stand, states "I have bad knees and  I knee replacements but it's not usually this bad" Pain Descriptors / Indicators: Sharp Pain Intervention(s): Monitored during session, Patient requesting pain meds-RN notified    Home Living Family/patient expects to be discharged to:: Private residence Living Arrangements: Spouse/significant other Available Help at Discharge: Family;Available PRN/intermittently (husband works) Type of Home: House Home Access: Stairs to enter Entrance  Stairs-Rails: Can reach both Entrance Stairs-Number of Steps: 2   Home Layout: One level Home Equipment: Agricultural consultant (2 wheels);Cane - single point      Prior Function Prior Level of Function : Independent/Modified Independent;Working/employed;Driving             Mobility Comments: used SPC, drove bus ADLs Comments: indep but scared to get in/out of tub due to falling     Extremity/Trunk Assessment   Upper Extremity Assessment Upper Extremity Assessment: Overall WFL for tasks assessed     Lower Extremity Assessment Lower Extremity Assessment: Generalized weakness (WBing limited by pain)    Cervical / Trunk Assessment Cervical / Trunk Assessment: Other exceptions Cervical / Trunk Exceptions: obesity  Communication   Communication Communication: No apparent difficulties    Cognition Arousal: Alert Behavior During Therapy: WFL for tasks assessed/performed   PT - Cognitive impairments: No apparent impairments                         Following commands: Intact       Cueing Cueing Techniques: Verbal cues     General Comments General comments (skin integrity, edema, etc.): VSS, pt given ice for R knee    Exercises     Assessment/Plan    PT Assessment Patient needs continued PT services  PT Problem List Decreased strength;Decreased activity tolerance;Decreased balance;Decreased mobility;Pain       PT Treatment Interventions DME instruction;Gait training;Stair training;Functional mobility training;Therapeutic activities;Therapeutic exercise;Balance training    PT Goals (Current goals can be found in the Care Plan section)  Acute Rehab PT Goals Patient Stated Goal: get better PT Goal Formulation: With patient Time For Goal Achievement: 11/16/23 Potential to Achieve Goals: Good    Frequency Min 2X/week     Co-evaluation               AM-PAC PT "6 Clicks" Mobility  Outcome Measure Help needed turning from your back to your side while in a flat bed without using bedrails?: A Little Help needed moving from lying on your back to sitting on the side of a flat bed without using bedrails?: A Little Help needed moving to and from a bed to a chair (including a wheelchair)?: Total Help needed standing up from a chair using your arms (e.g., wheelchair or bedside chair)?: Total Help needed to walk in hospital room?: Total Help needed climbing 3-5 steps with a railing? : Total 6 Click Score: 10    End of Session Equipment Utilized During  Treatment: Gait belt Activity Tolerance: Patient limited by pain Patient left: in bed;with call bell/phone within reach;with bed alarm set;with nursing/sitter in room Nurse Communication: Mobility status (R knee pain) PT Visit Diagnosis: Unsteadiness on feet (R26.81);Difficulty in walking, not elsewhere classified (R26.2)    Time: 1610-9604 PT Time Calculation (min) (ACUTE ONLY): 29 min   Charges:   PT Evaluation $PT Eval Low Complexity: 1 Low PT Treatments $Therapeutic Activity: 8-22 mins PT General Charges $$ ACUTE PT VISIT: 1 Visit         Renaee Caro, PT, DPT Acute Rehabilitation Services Secure chat preferred Office #: [phone]   Jenna Moan 11/02/2023, 2:15 PM

## 2023-11-02 NOTE — Progress Notes (Signed)
 Patient complained of pain and numbness in her right arm, she stated she was having intermittent numbness before as well but it was not that bad, MD  saw the patient at bedside. Patient stated she felt little better after sitting on the side of the bed, Gave her PRN Oxycodone  for pain, will continue to monitor

## 2023-11-02 NOTE — TOC Initial Note (Signed)
 Transition of Care [facility]) - Initial/Assessment Note    Patient Details  Name: Valerie Carter MRN: [id number] Date of Birth: [birth year]  Transition of Care [facility]) CM/SW Contact:    Jonathan Neighbor, RN Phone Number: 11/02/2023, 3:16 PM  Clinical Narrative:                  Pt is from home with her spouse. Spouse works day shift. Daughter can check on her after d/c.  Pt manages her own medications and denies any issues.  Pt drives self or family can transport.   TOC following.  Expected Discharge Plan: Home/Self Care Barriers to Discharge: Continued Medical Work up   Patient Goals and CMS Choice            Expected Discharge Plan and Services   Discharge Planning Services: CM Consult   Living arrangements for the past 2 months: Single Family Home                                      Prior Living Arrangements/Services Living arrangements for the past 2 months: Single Family Home Lives with:: Spouse Patient language and need for interpreter reviewed:: Yes Do you feel safe going back to the place where you live?: Yes        Care giver support system in place?: Yes (comment) Current home services: DME (cane/ walker) Criminal Activity/Legal Involvement Pertinent to Current Situation/Hospitalization: No - Comment as needed  Activities of Daily Living   ADL Screening (condition at time of admission) Independently performs ADLs?: Yes (appropriate for developmental age) Is the patient deaf or have difficulty hearing?: No Does the patient have difficulty seeing, even when wearing glasses/contacts?: No Does the patient have difficulty concentrating, remembering, or making decisions?: No  Permission Sought/Granted                  Emotional Assessment Appearance:: Appears stated age Attitude/Demeanor/Rapport: Engaged Affect (typically observed): Accepting Orientation: : Oriented to Self, Oriented to Place, Oriented to  Time, Oriented to Situation    Psych Involvement: No (comment)  Admission diagnosis:  Multifocal atrial tachycardia (HCC) [I47.19] Seizure [facility]) [R56.9] Patient Active Problem List   Diagnosis Date Noted   Seizure (HCC) 10/31/2023   Multifocal atrial tachycardia (HCC) 10/31/2023   SIRS (systemic inflammatory response syndrome) (HCC) 10/31/2023   Thickened endometrium 10/31/2023   Physical debility 04/01/2023   Acute blood loss anemia 03/17/2023   Iron  deficiency anemia due to chronic blood loss 03/17/2023   Symptomatic anemia 03/17/2023   Neck pain 04/16/2022   Patient left without being seen 12/22/2021   Palpitations 12/16/2021   Rotator cuff syndrome 05/08/2021   Diabetes mellitus (HCC) 04/10/2021   Rotator cuff impingement syndrome of left shoulder 01/07/2019   Constipation 03/16/2018   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 [place] [address] Phone: [phone] Fax: [phone]  Social Drivers of Health (SDOH) Social History: SDOH Screenings   Food Insecurity: No Food Insecurity (11/01/2023)  Housing: Low Risk  (11/01/2023)  Transportation Needs: No Transportation Needs (11/01/2023)  Utilities: Not At Risk (11/01/2023)  Alcohol Screen: Low Risk  (10/18/2022)  Depression (PHQ2-9): Medium Risk (10/18/2022)  Financial Resource Strain: Low Risk  (10/13/2023)   Received from [facility]  Physical Activity: Unknown (10/18/2022)  Social  Connections: Unknown (03/17/2023)   Received from [facility]  Stress: Stress Concern Present (10/18/2022)  Tobacco Use: Low Risk  (10/31/2023)   SDOH Interventions:     Readmission Risk Interventions     No data to display

## 2023-11-02 NOTE — Consult Note (Signed)
 Ref: [facility]   Subjective:  Awake. Low BP. HR in 90-110/min. Patient feels better. She needs to ambulate with cane or walker. Hypokalemia is corrected.  Objective:  Vital Signs in the last 24 hours: Temp:  [98.6 F (37 C)-99.8 F (37.7 C)] 99.3 F (37.4 C) (05/07 0848) Pulse Rate:  [85-120] 94 (05/07 0848) Cardiac Rhythm: Sinus tachycardia (05/07 0704) Resp:  [18-20] 18 (05/07 0420) BP: (88-129)/(58-84) 88/58 (05/07 0848) SpO2:  [96 %-100 %] 97 % (05/07 0848)  Physical Exam: BP Readings from Last 1 Encounters:  11/02/23 (!) 88/58     Wt Readings from Last 1 Encounters:  10/31/23 113.4 kg    Weight change:  Body mass index is 38.01 kg/m. HEENT: [place]/AT, Eyes-Brown, Conjunctiva-Pale pink, Sclera-Non-icteric Neck: No JVD, No bruit, Trachea midline. Lungs:  Clear, Bilateral. Cardiac:  Regular rhythm, normal S1 and S2, no S3. II/VI systolic murmur. Abdomen:  Soft, non-tender. BS present. Extremities:  No edema present. No cyanosis. No clubbing. CNS: AxOx3, Cranial nerves grossly intact, moves all 4 extremities.  Skin: Warm and dry.   Intake/Output from previous day: 05/06 0701 - 05/07 0700 In: 339.5 [P.O.:120; I.V.:219.5] Out: -     Lab Results: BMET    Component Value Date/Time   NA 138 11/02/2023 0543   NA 141 11/01/2023 0444   NA 139 10/31/2023 1659   NA 140 12/14/2021 1656   NA 142 06/02/2021 1336   NA 141 05/18/2021 1206   K 4.3 11/02/2023 0543   K 3.3 (L) 11/01/2023 0444   K 3.8 10/31/2023 1659   CL 109 11/02/2023 0543   CL 111 11/01/2023 0444   CL 105 10/31/2023 1659   CO2 21 (L) 11/02/2023 0543   CO2 21 (L) 11/01/2023 0444   CO2 18 (L) 10/31/2023 1659   GLUCOSE 120 (H) 11/02/2023 0543   GLUCOSE 112 (H) 11/01/2023 0444   GLUCOSE 115 (H) 10/31/2023 1659   BUN 11 11/02/2023 0543   BUN 9 11/01/2023 0444   BUN 11 10/31/2023 1659   BUN 14 12/14/2021 1656   BUN 19 06/02/2021 1336   BUN 15 05/18/2021 1206   CREATININE 1.08  (H) 11/02/2023 0543   CREATININE 0.78 11/01/2023 0444   CREATININE 0.76 10/31/2023 1659   CREATININE 1.03 07/23/2016 1108   CREATININE 0.77 02/06/2015 0929   CREATININE 0.79 05/06/2014 1114   CALCIUM  8.7 (L) 11/02/2023 0543   CALCIUM  8.4 (L) 11/01/2023 0444   CALCIUM  9.4 10/31/2023 1659   GFRNONAA 58 (L) 11/02/2023 0543   GFRNONAA >60 11/01/2023 0444   GFRNONAA >60 10/31/2023 1659   GFRNONAA 61 07/23/2016 1108   GFRAA 113 05/15/2018 1123   GFRAA >60 03/09/2018 0710   GFRAA 108 02/10/2018 0939   GFRAA 71 07/23/2016 1108   CBC    Component Value Date/Time   WBC 11.5 (H) 11/02/2023 0543   RBC 4.13 11/02/2023 0543   HGB 10.2 (L) 11/02/2023 0543   HGB 11.9 12/14/2021 1656   HCT 33.2 (L) 11/02/2023 0543   HCT 38.6 12/14/2021 1656   PLT 226 11/02/2023 0543   PLT 287 12/14/2021 1656   MCV 80.4 11/02/2023 0543   MCV 76 (L) 12/14/2021 1656   MCH 24.7 (L) 11/02/2023 0543   MCHC 30.7 11/02/2023 0543   RDW 15.2 11/02/2023 0543   RDW 14.6 12/14/2021 1656   LYMPHSABS 1.3 10/31/2023 1659   LYMPHSABS 1.9 05/18/2021 1206   MONOABS 0.9 10/31/2023 1659   EOSABS 0.0 10/31/2023 1659   EOSABS 0.2  05/18/2021 1206   BASOSABS 0.0 10/31/2023 1659   BASOSABS 0.0 05/18/2021 1206   HEPATIC Function Panel Recent Labs    03/16/23 2012 03/17/23 0446 10/31/23 1659  PROT 8.4* 7.6 7.7  ALBUMIN 3.4* 3.1* 3.2*  AST 19 16 29   ALT 14 12 19   ALKPHOS 67 60 87   HEMOGLOBIN A1C Lab Results  Component Value Date   MPG 96.8 11/02/2023   CARDIAC ENZYMES Lab Results  Component Value Date   CKTOTAL 177 01/20/2008   CKMB 3.3 01/20/2008   BNP No results for input(s): "PROBNP" in the last 8760 hours. TSH Recent Labs    11/01/23 0444  TSH 0.623   CHOLESTEROL No results for input(s): "CHOL" in the last 8760 hours.  Scheduled Meds:  diltiazem  60 mg Oral Q8H   enoxaparin  (LOVENOX ) injection  40 mg Subcutaneous Q24H   levETIRAcetam  500 mg Oral BID   metoprolol  tartrate  25 mg Oral QID    potassium chloride  10 mEq Oral BID   sodium chloride  flush  3 mL Intravenous Q12H   Continuous Infusions:  sodium chloride      PRN Meds:.acetaminophen  **OR** acetaminophen , oxyCODONE , prochlorperazine , senna-docusate  Assessment/Plan: Sinus tachycardia, improving Seizure disorder Acute sinusitis Hypokalemia, resolved HTN HLD  Plan: IV fluid for apparent dehydration Adjust metoprolol  dose for improved HR control.   LOS: 1 day   Time spent including chart review, lab review, examination, discussion with patient/Doctor/Family/Nurse : 30 min   Pasqual Bone  MD  11/02/2023, 9:33 AM

## 2023-11-03 ENCOUNTER — Inpatient Hospital Stay (HOSPITAL_COMMUNITY)

## 2023-11-03 DIAGNOSIS — R569 Unspecified convulsions: Secondary | ICD-10-CM | POA: Diagnosis not present

## 2023-11-03 LAB — CBC
HCT: 33.4 % — ABNORMAL LOW (ref 36.0–46.0)
Hemoglobin: 10.2 g/dL — ABNORMAL LOW (ref 12.0–15.0)
MCH: 24.8 pg — ABNORMAL LOW (ref 26.0–34.0)
MCHC: 30.5 g/dL (ref 30.0–36.0)
MCV: 81.3 fL (ref 80.0–100.0)
Platelets: 243 10*3/uL (ref 150–400)
RBC: 4.11 MIL/uL (ref 3.87–5.11)
RDW: 14.8 % (ref 11.5–15.5)
WBC: 11.5 10*3/uL — ABNORMAL HIGH (ref 4.0–10.5)
nRBC: 0 % (ref 0.0–0.2)

## 2023-11-03 LAB — BASIC METABOLIC PANEL WITH GFR
Anion gap: 10 (ref 5–15)
BUN: 10 mg/dL (ref 8–23)
CO2: 19 mmol/L — ABNORMAL LOW (ref 22–32)
Calcium: 8.6 mg/dL — ABNORMAL LOW (ref 8.9–10.3)
Chloride: 105 mmol/L (ref 98–111)
Creatinine, Ser: 0.78 mg/dL (ref 0.44–1.00)
GFR, Estimated: >60 mL/min (ref >60)
Glucose, Bld: 107 mg/dL — ABNORMAL HIGH (ref 70–99)
Potassium: 4.3 mmol/L (ref 3.5–5.1)
Sodium: 134 mmol/L — ABNORMAL LOW (ref 135–145)

## 2023-11-03 MED ORDER — METOPROLOL TARTRATE 50 MG PO TABS
50.0000 mg | ORAL_TABLET | Freq: Two times a day (BID) | ORAL | Status: DC
  Administered 2023-11-03 – 2023-11-04 (×3): 50 mg via ORAL
  Filled 2023-11-03 (×3): qty 1

## 2023-11-03 MED ORDER — POTASSIUM CHLORIDE CRYS ER 10 MEQ PO TBCR
10.0000 meq | EXTENDED_RELEASE_TABLET | Freq: Every day | ORAL | Status: DC
  Administered 2023-11-03 – 2023-11-04 (×2): 10 meq via ORAL
  Filled 2023-11-03 (×2): qty 1

## 2023-11-03 MED ORDER — CYCLOBENZAPRINE HCL 10 MG PO TABS
5.0000 mg | ORAL_TABLET | Freq: Three times a day (TID) | ORAL | Status: DC
  Administered 2023-11-03 – 2023-11-04 (×3): 5 mg via ORAL
  Filled 2023-11-03 (×3): qty 1

## 2023-11-03 MED ORDER — LIDOCAINE 5 % EX PTCH
1.0000 | MEDICATED_PATCH | CUTANEOUS | Status: DC
  Administered 2023-11-03: 1 via TRANSDERMAL
  Filled 2023-11-03 (×2): qty 1

## 2023-11-03 MED ORDER — DILTIAZEM HCL ER COATED BEADS 180 MG PO CP24
180.0000 mg | ORAL_CAPSULE | Freq: Every day | ORAL | Status: DC
  Administered 2023-11-03 – 2023-11-04 (×2): 180 mg via ORAL
  Filled 2023-11-03 (×2): qty 1

## 2023-11-03 NOTE — Evaluation (Signed)
 Occupational Therapy Evaluation Patient Details Name: Valerie Carter MRN: [id number] DOB: 1961/04/14 Today's Date: 11/03/2023   History of Present Illness   Pt is a 63yo female admitted s/p witnessed seizure. PMH:  HTN, HLD, DM II, and 1 prior seizure-like episode     Clinical Impressions PTA pt reports being Mod I with mobility using SPC and having assist with some ADLs from her spouse but able to bathe herself, drives buses. Pt currently limited by R knee pain, unable to tolerate WB in it and needing min A to steady balance. Pain meds did not seem to help her pain, ice applied at this time. Pt also needing total A to setup A for ADLs, unable to transfer at this time. Pt would benefit from continued acute skilled OT services to address listed deficits and help transition to next level of care. Patient will benefit from continued inpatient follow up therapy, <3 hours/day      If plan is discharge home, recommend the following:   A lot of help with bathing/dressing/bathroom;A lot of help with walking and/or transfers;Assistance with cooking/housework;Assist for transportation;Help with stairs or ramp for entrance     Functional Status Assessment   Patient has had a recent decline in their functional status and demonstrates the ability to make significant improvements in function in a reasonable and predictable amount of time.     Equipment Recommendations   None recommended by OT (defer, not able to transfer so not safe to DC home at this time)     Recommendations for Other Services         Precautions/Restrictions   Precautions Precautions: Fall Precaution/Restrictions Comments: seizures Restrictions Weight Bearing Restrictions Per Provider Order: No     Mobility Bed Mobility   Bed Mobility: Sit to Supine       Sit to supine: Mod assist   General bed mobility comments: Pt sitting EOB on arrival, Mod A to assist BLEs with return to supine.     Transfers Overall transfer level: Needs assistance Equipment used: Rolling walker (2 wheels) Transfers: Sit to/from Stand Sit to Stand: From elevated surface, Min assist           General transfer comment: bed elevated to height to resemble bed at home. initial STS was min A + RW. Second attempt was more pain limiting and pt needing max A+2 but did not achieve full upright standing secondary to intolerance.      Balance Overall balance assessment: Needs assistance Sitting-balance support: Feet supported, Bilateral upper extremity supported Sitting balance-Leahy Scale: Good     Standing balance support: Bilateral upper extremity supported Standing balance-Leahy Scale: Poor Standing balance comment: unable due to pain, stood and performed weight shifts with minA                           ADL either performed or assessed with clinical judgement   ADL Overall ADL's : Needs assistance/impaired Eating/Feeding: Independent;Sitting   Grooming: Sitting;Oral care;Set up;Wash/dry face   Upper Body Bathing: Sitting;Set up Upper Body Bathing Details (indicate cue type and reason): unable to perform in standing at this time. pain in RLE with WB Lower Body Bathing: Sitting/lateral leans;Maximal assistance   Upper Body Dressing : Sitting;Set up   Lower Body Dressing: Sit to/from stand;Sitting/lateral leans;Total assistance     Toilet Transfer Details (indicate cue type and reason): unable to transfer, limited by RLE pain  Vision   Vision Assessment?: No apparent visual deficits     Perception         Praxis         Pertinent Vitals/Pain Pain Assessment Pain Assessment: Faces Faces Pain Scale: Hurts worst Pain Location: R knee with WB Pain Descriptors / Indicators: Sharp, Shooting Pain Intervention(s): Monitored during session, Limited activity within patient's tolerance, Premedicated before session, Ice applied (oxy 1hr prior to session)      Extremity/Trunk Assessment Upper Extremity Assessment Upper Extremity Assessment: Overall WFL for tasks assessed   Lower Extremity Assessment Lower Extremity Assessment: Generalized weakness;RLE deficits/detail RLE Deficits / Details: WBing limited by pain   Cervical / Trunk Assessment Cervical / Trunk Assessment: Other exceptions Cervical / Trunk Exceptions: obesity   Communication Communication Communication: No apparent difficulties   Cognition Arousal: Alert Behavior During Therapy: WFL for tasks assessed/performed Cognition: Cognition impaired     Awareness: Intellectual awareness intact, Online awareness impaired       OT - Cognition Comments: Very determined to make it home for her grandon's birthday and puts lots of efforts to persevere through pain despite it placing herself in more pain or creating risk of fall                 Following commands: Intact       Cueing  General Comments   Cueing Techniques: Verbal cues  Pt reports her R knee pain is worse than normal, messaged MD to potentially procure imaging.   Exercises     Shoulder Instructions      Home Living Family/patient expects to be discharged to:: Private residence Living Arrangements: Spouse/significant other Available Help at Discharge: Family;Available PRN/intermittently (husband works) Type of Home: House Home Access: Stairs to enter Secretary/administrator of Steps: 2 Entrance Stairs-Rails: Can reach both Home Layout: One level     Bathroom Shower/Tub: Chief Strategy Officer: Standard     Home Equipment: Agricultural consultant (2 wheels);Cane - single point          Prior Functioning/Environment Prior Level of Function : Independent/Modified Independent;Working/employed;Driving             Mobility Comments: used SPC, drove bus ADLs Comments: indep but scared to get in/out of tub due to falling    OT Problem List: Impaired balance (sitting and/or  standing);Decreased activity tolerance;Pain   OT Treatment/Interventions: Self-care/ADL training;Visual/perceptual remediation/compensation;Therapeutic exercise;Balance training;Patient/family education;Therapeutic activities;DME and/or AE instruction      OT Goals(Current goals can be found in the care plan section)   Acute Rehab OT Goals Patient Stated Goal: To get better OT Goal Formulation: With patient Time For Goal Achievement: 11/17/23 Potential to Achieve Goals: Good ADL Goals Pt Will Perform Grooming: standing;with supervision Pt Will Perform Lower Body Dressing: with set-up;sit to/from stand Pt Will Transfer to Toilet: bedside commode;stand pivot transfer;with contact guard assist Pt Will Perform Tub/Shower Transfer: Tub transfer;with supervision Additional ADL Goal #2: Pt will tolerate >2 mins static standing with Supervision as a precursor to OOB ADLs   OT Frequency:  Min 2X/week    Co-evaluation              AM-PAC OT "6 Clicks" Daily Activity     Outcome Measure Help from another person eating meals?: None Help from another person taking care of personal grooming?: A Little Help from another person toileting, which includes using toliet, bedpan, or urinal?: Total Help from another person bathing (including washing, rinsing, drying)?: A Lot Help from another person  to put on and taking off regular upper body clothing?: A Lot Help from another person to put on and taking off regular lower body clothing?: Total 6 Click Score: 13   End of Session Equipment Utilized During Treatment: Gait belt;Rolling walker (2 wheels) Nurse Communication: Mobility status  Activity Tolerance: Patient limited by pain Patient left: in bed;with call bell/phone within reach;with bed alarm set;with family/visitor present  OT Visit Diagnosis: Pain;Unsteadiness on feet (R26.81);Other abnormalities of gait and mobility (R26.89) Pain - Right/Left: Right Pain - part of body: Knee                 Time: 1478-2956 OT Time Calculation (min): 30 min Charges:  OT General Charges $OT Visit: 1 Visit OT Evaluation $OT Eval Low Complexity: 1 Low OT Treatments $Therapeutic Activity: 8-22 mins  11/03/2023  AB, OTR/L  Acute Rehabilitation Services  Office: [phone]   Jorene New 11/03/2023, 5:32 PM

## 2023-11-03 NOTE — Consult Note (Signed)
 Ref: [facility]   Subjective:  Awake. VS stable with HR in 90-110/min. No significant ambulation per patient. Dehydration improved with hydration. Potassium 4.3 meq.  Objective:  Vital Signs in the last 24 hours: Temp:  [98.8 F (37.1 C)-99.8 F (37.7 C)] 98.8 F (37.1 C) (05/08 0733) Pulse Rate:  [90-110] 110 (05/08 0733) Cardiac Rhythm: Sinus tachycardia (05/08 0743) Resp:  [16-18] 18 (05/08 0331) BP: (88-128)/(60-87) 125/81 (05/08 0733) SpO2:  [97 %-100 %] 97 % (05/08 0733)  Physical Exam: BP Readings from Last 1 Encounters:  11/03/23 125/81     Wt Readings from Last 1 Encounters:  10/31/23 113.4 kg    Weight change:  Body mass index is 38.01 kg/m. HEENT: [name], Eyes-Brown, Conjunctiva-Pale pink, Sclera-Non-icteric Neck: No JVD, No bruit, Trachea midline. Lungs:  Clear, Bilateral. Cardiac:  Regular rhythm, normal S1 and S2, no S3. II/VI systolic murmur. Abdomen:  Soft, non-tender. BS present. Extremities:  Trace edema present. No cyanosis. No clubbing. CNS: AxOx3, Cranial nerves grossly intact, moves all 4 extremities.  Skin: Warm and dry.   Intake/Output from previous day: 05/07 0701 - 05/08 0700 In: 300.2 [I.V.:300.2] Out: 1300 [Urine:1300]    Lab Results: BMET    Component Value Date/Time   NA 134 (L) 11/03/2023 0718   NA 138 11/02/2023 0543   NA 141 11/01/2023 0444   NA 140 12/14/2021 1656   NA 142 06/02/2021 1336   NA 141 05/18/2021 1206   K 4.3 11/03/2023 0718   K 4.3 11/02/2023 0543   K 3.3 (L) 11/01/2023 0444   CL 105 11/03/2023 0718   CL 109 11/02/2023 0543   CL 111 11/01/2023 0444   CO2 19 (L) 11/03/2023 0718   CO2 21 (L) 11/02/2023 0543   CO2 21 (L) 11/01/2023 0444   GLUCOSE 107 (H) 11/03/2023 0718   GLUCOSE 120 (H) 11/02/2023 0543   GLUCOSE 112 (H) 11/01/2023 0444   BUN 10 11/03/2023 0718   BUN 11 11/02/2023 0543   BUN 9 11/01/2023 0444   BUN 14 12/14/2021 1656   BUN 19 06/02/2021 1336   BUN 15 05/18/2021  1206   CREATININE 0.78 11/03/2023 0718   CREATININE 1.08 (H) 11/02/2023 0543   CREATININE 0.78 11/01/2023 0444   CREATININE 1.03 07/23/2016 1108   CREATININE 0.77 02/06/2015 0929   CREATININE 0.79 05/06/2014 1114   CALCIUM  8.6 (L) 11/03/2023 0718   CALCIUM  8.7 (L) 11/02/2023 0543   CALCIUM  8.4 (L) 11/01/2023 0444   GFRNONAA >60 11/03/2023 0718   GFRNONAA 58 (L) 11/02/2023 0543   GFRNONAA >60 11/01/2023 0444   GFRNONAA 61 07/23/2016 1108   GFRAA 113 05/15/2018 1123   GFRAA >60 03/09/2018 0710   GFRAA 108 02/10/2018 0939   GFRAA 71 07/23/2016 1108   CBC    Component Value Date/Time   WBC 11.5 (H) 11/03/2023 0718   RBC 4.11 11/03/2023 0718   HGB 10.2 (L) 11/03/2023 0718   HGB 11.9 12/14/2021 1656   HCT 33.4 (L) 11/03/2023 0718   HCT 38.6 12/14/2021 1656   PLT 243 11/03/2023 0718   PLT 287 12/14/2021 1656   MCV 81.3 11/03/2023 0718   MCV 76 (L) 12/14/2021 1656   MCH 24.8 (L) 11/03/2023 0718   MCHC 30.5 11/03/2023 0718   RDW 14.8 11/03/2023 0718   RDW 14.6 12/14/2021 1656   LYMPHSABS 1.3 10/31/2023 1659   LYMPHSABS 1.9 05/18/2021 1206   MONOABS 0.9 10/31/2023 1659   EOSABS 0.0 10/31/2023 1659   EOSABS 0.2 05/18/2021  1206   BASOSABS 0.0 10/31/2023 1659   BASOSABS 0.0 05/18/2021 1206   HEPATIC Function Panel Recent Labs    03/16/23 2012 03/17/23 0446 10/31/23 1659  PROT 8.4* 7.6 7.7  ALBUMIN 3.4* 3.1* 3.2*  AST 19 16 29   ALT 14 12 19   ALKPHOS 67 60 87   HEMOGLOBIN A1C Lab Results  Component Value Date   MPG 96.8 11/02/2023   CARDIAC ENZYMES Lab Results  Component Value Date   CKTOTAL 177 01/20/2008   CKMB 3.3 01/20/2008   BNP No results for input(s): "PROBNP" in the last 8760 hours. TSH Recent Labs    11/01/23 0444  TSH 0.623   CHOLESTEROL No results for input(s): "CHOL" in the last 8760 hours.  Scheduled Meds:  diltiazem  180 mg Oral Daily   enoxaparin  (LOVENOX ) injection  40 mg Subcutaneous Q24H   levETIRAcetam  500 mg Oral BID   metoprolol   tartrate  50 mg Oral BID   potassium chloride  10 mEq Oral Daily   sodium chloride  flush  3 mL Intravenous Q12H   Continuous Infusions:  sodium chloride  50 mL/hr at 11/03/23 0626   PRN Meds:.acetaminophen  **OR** acetaminophen , oxyCODONE , prochlorperazine , senna-docusate  Assessment/Plan: Sinus tachycardia Seizure disorder Acute sinusitis HTN HLD Obesity  Plan: Increase activity. Metoprolol  and diltiazem dosing simplified. F/U in 1 week.   LOS: 2 days   Time spent including chart review, lab review, examination, discussion with patient/Nurse/Doctor : 30 min   Valerie Bone  MD  11/03/2023, 9:09 AM

## 2023-11-03 NOTE — Plan of Care (Signed)
  Problem: Education: Goal: Knowledge of General Education information will improve Description: Including pain rating scale, medication(s)/side effects and non-pharmacologic comfort measures Outcome: Progressing   Problem: Health Behavior/Discharge Planning: Goal: Ability to manage health-related needs will improve Outcome: Progressing   Problem: Clinical Measurements: Goal: Ability to maintain clinical measurements within normal limits will improve Outcome: Progressing Goal: Will remain free from infection Outcome: Progressing Goal: Diagnostic test results will improve Outcome: Progressing Goal: Respiratory complications will improve Outcome: Progressing Goal: Cardiovascular complication will be avoided Outcome: Progressing   Problem: Activity: Goal: Risk for activity intolerance will decrease Outcome: Not Progressing Note: R/t pain   Problem: Nutrition: Goal: Adequate nutrition will be maintained Outcome: Progressing   Problem: Coping: Goal: Level of anxiety will decrease Outcome: Progressing   Problem: Elimination: Goal: Will not experience complications related to bowel motility Outcome: Progressing Goal: Will not experience complications related to urinary retention Outcome: Progressing   Problem: Pain Managment: Goal: General experience of comfort will improve and/or be controlled Outcome: Not Progressing Note: Medications changed   Problem: Safety: Goal: Ability to remain free from injury will improve Outcome: Progressing   Problem: Skin Integrity: Goal: Risk for impaired skin integrity will decrease Outcome: Progressing

## 2023-11-03 NOTE — Progress Notes (Signed)
 PROGRESS NOTE    Valerie Carter  WUJ:[id number] DOB: 09-02-1960 DOA: 10/31/2023 PCP: [facility]    Brief Narrative:  63 year old with history of hypertension, hyperlipidemia, type 2 diabetes, prior possible seizure episode who presented to the emergency room with 1 episode of witnessed seizure by her husband at home.  Patient was apparently awake all night taking care of her aunt, she had also taken a dose of oxycodone  in the morning, she walked out of the bathroom and collapsed.  Husband noted tonic-clonic seizure.  Patient woke up with EMS.  Noted to have postictal confusion.  Lasted about 2 minutes.  In the emergency room hemodynamically stable.  Found to have atrial tachycardia.  Also has chronic neck pain and radiculopathy to the right arm. Patient improved but could not walk due to severe right knee pain and stiffness.    Subjective:  Patient seen and examined.  Blood pressures better.  Heart rate is better.  Patient was attempted to mobilize, she had severe stiffness and pain on her right knee.  Neck pain is better today.  Reexamined in the afternoon, patient could not walk to be discharged.  No orthostatic symptoms present. 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  Followed by sports medicine. No more seizure episodes or dizziness.   Assessment & Plan:   Seizure disorder: Witnessed tonic-clonic seizure. MRI brain, without any acute findings. Spot EEG, without any acute seizure. Seen by neurology.  Suggested Keppra 500 mg twice daily. Currently contraindication to driving.  All-time seizure precautions.  She will have follow-up with neurology as outpatient.  Atrial tachycardia with dizziness: TSH normal. Echocardiogram essentially normal.  Normal ejection fraction. Heart rate optimally controlled with Cardizem 60 mg 3 times daily and metoprolol  25 mg 4 times daily.   Cardiology recommended to change medications to long-acting medicine. Will have outpatient follow-up.  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  No spinal compression.  Pain medications.  Will send outpatient therapy on discharge. CT angiogram of the chest without evidence of PE.  Ambulatory dysfunction/bilateral severe osteoarthritis of the knees: Patient with significant pain and debility.  More pain and stiffness of the right knee. X-ray of the right knee. Continue oxycodone  every 4 hours as needed. Start Flexeril  5 mg 3 times daily. Lidocaine  patch to the right knee. Gradual weightbearing and mobility. Outpatient follow-up with sports medicine/orthopedics.  Electrolytes: Adequate.  Replaced.  Essential hypertension: As above.  Currently not on any blood pressure medications.  Discontinued lisinopril  to make room for rate control medications including Cardizem and metoprolol .   Continue to mobilize today.  Heart rate controlled, however unable to walk safely.  Discharge when safely working.   DVT prophylaxis: enoxaparin  (LOVENOX ) injection 40 mg Start: 10/31/23 2315   Code Status: Full code Family Communication: Husband at the bedside. Disposition Plan: Status is: Inpatient Remains inpatient appropriate because: Heart rate control.  Unable to mobilize.     Consultants:  Neurology Cardiology  Procedures:  None  Antimicrobials:  None     Objective: Vitals:   11/03/23 0331 11/03/23 0628 11/03/23 0733 11/03/23 1206  BP: 117/80 121/79 125/81 119/67  Pulse:   (!) 110 (!) 101  Resp: 18     Temp: 99.1 F (37.3 C)  98.8 F (37.1 C) 98.4 F (36.9 C)  TempSrc: Oral  Oral Oral  SpO2: 100%  97% 99%  Weight:  Height:        Intake/Output Summary (Last 24 hours) at 11/03/2023 1415 Last data filed at 11/03/2023 1258 Gross per 24 hour  Intake 1331.18 ml  Output 1600 ml  Net -268.82 ml   Filed Weights   10/31/23  1616  Weight: 113.4 kg    Examination:  General exam: Comfortable at rest.  Apprehensive on mobility. Respiratory system: Clear to auscultation. Respiratory effort normal. Cardiovascular system: S1 & S2 heard, RRR.  Tachycardia.  No pedal edema. Gastrointestinal system: Abdomen is nondistended, soft and nontender. No organomegaly or masses felt. Normal bowel sounds heard. Central nervous system: Alert and oriented. No focal neurological deficits. Extremities: Symmetric 5 x 5 power. Skin: No rashes, lesions or ulcers Psychiatry: Judgement and insight appear normal. Mood & affect appropriate.  Restricted mobility and flexion of both knees, right more than left.  No obvious swelling or deformity, no obvious erythema or induration.  Patellar tap Negative.    Data Reviewed: I have personally reviewed following labs and imaging studies  CBC: Recent Labs  Lab 10/31/23 1659 11/01/23 0444 11/02/23 0543 11/03/23 0718  WBC 16.6* 17.7* 11.5* 11.5*  NEUTROABS 14.2*  --   --   --   HGB 12.9 10.6* 10.2* 10.2*  HCT 42.2 34.4* 33.2* 33.4*  MCV 80.8 81.1 80.4 81.3  PLT 253 250 226 243   Basic Metabolic Panel: Recent Labs  Lab 10/31/23 1659 11/01/23 0444 11/02/23 0543 11/03/23 0718  NA 139 141 138 134*  K 3.8 3.3* 4.3 4.3  CL 105 111 109 105  CO2 18* 21* 21* 19*  GLUCOSE 115* 112* 120* 107*  BUN 11 9 11 10   CREATININE 0.76 0.78 1.08* 0.78  CALCIUM  9.4 8.4* 8.7* 8.6*  MG 1.6* 1.8 1.8  --   PHOS  --  2.9  --   --    GFR: Estimated Creatinine Clearance: 96.3 mL/min (by C-G formula based on SCr of 0.78 mg/dL). Liver Function Tests: Recent Labs  Lab 10/31/23 1659  AST 29  ALT 19  ALKPHOS 87  BILITOT 1.5*  PROT 7.7  ALBUMIN 3.2*   No results for input(s): "LIPASE", "AMYLASE" in the last 168 hours. No results for input(s): "AMMONIA" in the last 168 hours. Coagulation Profile: No results for input(s): "INR", "PROTIME" in the last 168 hours. Cardiac Enzymes: No results for  input(s): "CKTOTAL", "CKMB", "CKMBINDEX", "TROPONINI" in the last 168 hours. BNP (last 3 results) No results for input(s): "PROBNP" in the last 8760 hours. HbA1C: Recent Labs    11/02/23 0543  HGBA1C 5.0   CBG: Recent Labs  Lab 10/31/23 1715 11/01/23 1642  GLUCAP 114* 134*   Lipid Profile: No results for input(s): "CHOL", "HDL", "LDLCALC", "TRIG", "CHOLHDL", "LDLDIRECT" in the last 72 hours. Thyroid Function Tests: Recent Labs    11/01/23 0444  TSH 0.623  FREET4 1.25*   Anemia Panel: No results for input(s): "VITAMINB12", "FOLATE", "FERRITIN", "TIBC", "IRON ", "RETICCTPCT" in the last 72 hours. Sepsis Labs: Recent Labs  Lab 10/31/23 1726 10/31/23 2156  LATICACIDVEN 3.7* 1.2    No results found for this or any previous visit (from the past 240 hours).       Radiology Studies: CT CERVICAL SPINE WO CONTRAST Result Date: 11/02/2023 CLINICAL DATA:  Cervical radiculopathy without red flag symptoms EXAM: CT CERVICAL SPINE WITHOUT CONTRAST TECHNIQUE: Multidetector CT imaging of the cervical spine was performed without intravenous contrast. Multiplanar CT image reconstructions were also generated. RADIATION DOSE REDUCTION: This exam was performed according to the departmental  dose-optimization program which includes automated exposure control, adjustment of the mA and/or kV according to patient size and/or use of iterative reconstruction technique. COMPARISON:  None Available. FINDINGS: Alignment: Physiologic Skull base and vertebrae: No evidence of fracture or bone lesion. Soft tissues and spinal canal: No evidence of perispinal mass or inflammation. Disc levels: Atlantal dental degenerative spurring with inferior enthesophyte from the anterior arch of C1. Diffusely preserved disc height with mild ventral endplate spurring at C4-5 and C5-6. No significant facet spurring. No visible herniation. No bony impingement. Upper chest: Negative IMPRESSION: No acute finding. Mild degenerative  spurring without bony impingement throughout the cervical spine. Electronically Signed   By: Ronnette Coke M.D.   On: 11/02/2023 06:21   CT Angio Chest Pulmonary Embolism (PE) W or WO Contrast Result Date: 11/01/2023 CLINICAL DATA:  Pulmonary embolism (PE) suspected, high prob. Difficulty breathing. EXAM: CT ANGIOGRAPHY CHEST WITH CONTRAST TECHNIQUE: Multidetector CT imaging of the chest was performed using the standard protocol during bolus administration of intravenous contrast. Multiplanar CT image reconstructions and MIPs were obtained to evaluate the vascular anatomy. RADIATION DOSE REDUCTION: This exam was performed according to the departmental dose-optimization program which includes automated exposure control, adjustment of the mA and/or kV according to patient size and/or use of iterative reconstruction technique. CONTRAST:  75mL OMNIPAQUE IOHEXOL 350 MG/ML SOLN COMPARISON:  None Available. FINDINGS: Cardiovascular: No filling defects in the pulmonary arteries to suggest pulmonary emboli. Heart is normal size. Aorta is normal caliber. Few scattered calcifications in the left anterior descending coronary artery. Mediastinum/Nodes: No mediastinal, hilar, or axillary adenopathy. Trachea and esophagus are unremarkable. Thyroid unremarkable. Lungs/Pleura: Linear subsegmental atelectasis in the lung bases/lower lobes. No confluent opacities or effusions. Upper Abdomen: Small hiatal hernia.  No acute findings. Musculoskeletal: Chest wall soft tissues are unremarkable. No acute bony abnormality. Review of the MIP images confirms the above findings. IMPRESSION: No evidence of pulmonary embolus. Few scattered calcifications in the left anterior descending coronary artery. Bibasilar atelectasis. Electronically Signed   By: Janeece Mechanic M.D.   On: 11/01/2023 18:32        Scheduled Meds:  cyclobenzaprine   5 mg Oral TID   diltiazem  180 mg Oral Daily   enoxaparin  (LOVENOX ) injection  40 mg Subcutaneous  Q24H   levETIRAcetam  500 mg Oral BID   lidocaine   1 patch Transdermal Q24H   metoprolol  tartrate  50 mg Oral BID   potassium chloride  10 mEq Oral Daily   sodium chloride  flush  3 mL Intravenous Q12H   Continuous Infusions:     LOS: 2 days      Vada Garibaldi, MD [facility]

## 2023-11-03 NOTE — Progress Notes (Signed)
 Physical Therapy Treatment Patient Details Name: Valerie Carter MRN: [id number] DOB: 02-18-1961 Today's Date: 11/03/2023   History of Present Illness Pt is a 62yo female admitted s/p witnessed seizure. PMH:  HTN, HLD, DM II, and 1 prior seizure-like episode    PT Comments  Pt is presenting significantly below baseline level of functioning. Currently pt is unable to stand for longer than 3 seconds due to significant pain in the RLE with Min A and heavy use of RW. Pt is Supervision to Min A for bed mobility and unable to progress gait due to R knee pain. Attempted manual ROM, AA/ROM and A/ROM with distraction and gentle grade II mobs for pain modulation with pt reporting decreased pain in sitting but increased again significantly with any weight bearing. RN and MD notified. Due to pt current functional status, home set up and available assistance at home recommending skilled physical therapy services < 3 hours/day in order to address strength, balance and functional mobility to decrease risk for falls, injury, immobility, skin break down and re-hospitalization.      If plan is discharge home, recommend the following: A lot of help with walking and/or transfers;A little help with bathing/dressing/bathroom;Help with stairs or ramp for entrance;Assist for transportation   Can travel by private vehicle     No  Equipment Recommendations  None recommended by PT       Precautions / Restrictions Precautions Precautions: Fall Precaution/Restrictions Comments: seizures Restrictions Weight Bearing Restrictions Per Provider Order: No     Mobility  Bed Mobility Overal bed mobility: Needs Assistance Bed Mobility: Supine to Sit, Sit to Supine     Supine to sit: Supervision, HOB elevated, Used rails Sit to supine: Min assist   General bed mobility comments: heavy use of bed rails, HOB elevated when transferring to EOB, minA for LE management back into bed    Transfers Overall transfer  level: Needs assistance Equipment used: Rolling walker (2 wheels) Transfers: Sit to/from Stand Sit to Stand: From elevated surface, Min assist           General transfer comment: pt stood 2x from EOB with significantly flexed posture and increased time with EOB elevated to ~30 inches    Ambulation/Gait     General Gait Details: unable this date due to R knee pain      Balance Overall balance assessment: Needs assistance Sitting-balance support: Feet supported, Bilateral upper extremity supported Sitting balance-Leahy Scale: Good     Standing balance support: Bilateral upper extremity supported Standing balance-Leahy Scale: Zero Standing balance comment: unable due to pain      Communication Communication Communication: No apparent difficulties  Cognition Arousal: Alert Behavior During Therapy: WFL for tasks assessed/performed   PT - Cognitive impairments: No apparent impairments   Following commands: Intact      Cueing Cueing Techniques: Verbal cues     General Comments General comments (skin integrity, edema, etc.): Pt reports she has R knee pain normally but she can get around.      Pertinent Vitals/Pain Pain Assessment Pain Assessment: Faces Faces Pain Scale: Hurts worst Pain Location: R knee with movement Pain Descriptors / Indicators: Sharp Pain Intervention(s): Monitored during session, Limited activity within patient's tolerance     PT Goals (current goals can now be found in the care plan section) Acute Rehab PT Goals Patient Stated Goal: get better PT Goal Formulation: With patient Time For Goal Achievement: 11/16/23 Potential to Achieve Goals: Good Progress towards PT goals: Progressing toward goals  Frequency    Min 2X/week      PT Plan  Continue with current POC        AM-PAC PT "6 Clicks" Mobility   Outcome Measure  Help needed turning from your back to your side while in a flat bed without using bedrails?: A Little Help  needed moving from lying on your back to sitting on the side of a flat bed without using bedrails?: A Little Help needed moving to and from a bed to a chair (including a wheelchair)?: Total Help needed standing up from a chair using your arms (e.g., wheelchair or bedside chair)?: Total Help needed to walk in hospital room?: Total Help needed climbing 3-5 steps with a railing? : Total 6 Click Score: 10    End of Session Equipment Utilized During Treatment: Gait belt Activity Tolerance: Patient limited by pain Patient left: in bed;with call bell/phone within reach;with bed alarm set Nurse Communication: Mobility status PT Visit Diagnosis: Unsteadiness on feet (R26.81);Difficulty in walking, not elsewhere classified (R26.2)     Time: 6578-4696 PT Time Calculation (min) (ACUTE ONLY): 25 min  Charges:    $Therapeutic Activity: 23-37 mins PT General Charges $$ ACUTE PT VISIT: 1 Visit                     Sloan Duncans, DPT, CLT  Acute Rehabilitation Services Office: [phone] (Secure chat preferred)    Jenice Mitts 11/03/2023, 11:38 AM

## 2023-11-04 DIAGNOSIS — R569 Unspecified convulsions: Secondary | ICD-10-CM | POA: Diagnosis not present

## 2023-11-04 LAB — BASIC METABOLIC PANEL WITH GFR
Anion gap: 14 (ref 5–15)
BUN: 10 mg/dL (ref 8–23)
CO2: 16 mmol/L — ABNORMAL LOW (ref 22–32)
Calcium: 9.1 mg/dL (ref 8.9–10.3)
Chloride: 108 mmol/L (ref 98–111)
Creatinine, Ser: 0.78 mg/dL (ref 0.44–1.00)
GFR, Estimated: >60 mL/min (ref >60)
Glucose, Bld: 76 mg/dL (ref 70–99)
Potassium: 4.5 mmol/L (ref 3.5–5.1)
Sodium: 138 mmol/L (ref 135–145)

## 2023-11-04 MED ORDER — DILTIAZEM HCL ER COATED BEADS 180 MG PO CP24: 180.0000 mg | ORAL_CAPSULE | Freq: Every day | ORAL | 2 refills | Status: DC

## 2023-11-04 MED ORDER — FERROUS SULFATE 325 (65 FE) MG PO TABS: 325.0000 mg | ORAL_TABLET | Freq: Every day | ORAL | 3 refills | Status: DC

## 2023-11-04 MED ORDER — POTASSIUM CHLORIDE CRYS ER 10 MEQ PO TBCR: 10.0000 meq | EXTENDED_RELEASE_TABLET | Freq: Every day | ORAL | 0 refills | Status: DC

## 2023-11-04 MED ORDER — METOPROLOL TARTRATE 50 MG PO TABS: 50.0000 mg | ORAL_TABLET | Freq: Two times a day (BID) | ORAL | 2 refills | Status: AC

## 2023-11-04 MED ORDER — FERROUS SULFATE 325 (65 FE) MG PO TABS
325.0000 mg | ORAL_TABLET | Freq: Every day | ORAL | Status: DC
  Administered 2023-11-04: 325 mg via ORAL
  Filled 2023-11-04: qty 1

## 2023-11-04 MED ORDER — ASCORBIC ACID 250 MG PO TABS: 250.0000 mg | ORAL_TABLET | Freq: Every day | ORAL | 3 refills | Status: DC

## 2023-11-04 MED ORDER — HYDROCODONE-ACETAMINOPHEN 5-325 MG PO TABS: 1.0000 | ORAL_TABLET | ORAL | 0 refills | Status: AC | PRN

## 2023-11-04 MED ORDER — DICLOFENAC SODIUM 3 % EX GEL: 1.0000 | Freq: Two times a day (BID) | CUTANEOUS | 2 refills | Status: AC

## 2023-11-04 MED ORDER — VITAMIN C 500 MG PO TABS
250.0000 mg | ORAL_TABLET | Freq: Every day | ORAL | Status: DC
  Administered 2023-11-04: 250 mg via ORAL
  Filled 2023-11-04: qty 1

## 2023-11-04 MED ORDER — CYCLOBENZAPRINE HCL 5 MG PO TABS: 5.0000 mg | ORAL_TABLET | Freq: Three times a day (TID) | ORAL | 0 refills | Status: DC

## 2023-11-04 MED ORDER — LEVETIRACETAM 500 MG PO TABS: 500.0000 mg | ORAL_TABLET | Freq: Two times a day (BID) | ORAL | 0 refills | Status: DC

## 2023-11-04 NOTE — TOC Transition Note (Signed)
 Transition of Care [facility]) - Discharge Note   Patient Details  Name: Valerie Carter MRN: [id number] Date of Birth: [birth year]  [facility]) CM/SW Contact:  Jonathan Neighbor, RN Phone Number: 11/04/2023, 10:47 AM   Clinical Narrative:     Recommendations are for SNF but pt prefers to d/c home with her spouse.  Home health arranged with [facility]. Information on the AVS. Gasper Karst will contact her for the first home visit.  Wheelchair for home ordered through [employer] and will be delivered to the room. Spouse to provide transportation home.  Final next level of care: Home w Home Health Services Barriers to Discharge: No Barriers Identified   Patient Goals and CMS Choice   CMS Medicare.gov Compare Post Acute Care list provided to:: Patient Choice offered to / list presented to : Patient      Discharge Placement                       Discharge Plan and Services Additional resources added to the After Visit Summary for     Discharge Planning Services: CM Consult            DME Arranged: Wheelchair manual DME Agency: [employer] Date DME Agency Contacted: 11/04/23   Representative spoke with at DME Agency: Zack HH Arranged: PT, OT HH Agency: [facility] Date [facility] Agency Contacted: 11/04/23   Representative spoke with at [facility] Agency: Randel Buss  Social Drivers of Health (SDOH) Interventions SDOH Screenings   Food Insecurity: No Food Insecurity (11/01/2023)  Housing: Low Risk  (11/01/2023)  Transportation Needs: No Transportation Needs (11/01/2023)  Utilities: Not At Risk (11/01/2023)  Alcohol Screen: Low Risk  (10/18/2022)  Depression (PHQ2-9): Medium Risk (10/18/2022)  Financial Resource Strain: Low Risk  (10/13/2023)   Received from [facility]  Physical Activity: Unknown (10/18/2022)  Social Connections: Unknown (03/17/2023)   Received from [facility]  Stress: Stress Concern Present (10/18/2022)  Tobacco Use: Low Risk  (10/31/2023)     Readmission  Risk Interventions     No data to display

## 2023-11-04 NOTE — Plan of Care (Signed)

## 2023-11-04 NOTE — Discharge Summary (Signed)
 Physician Discharge Summary  Valerie Carter ZOX:[id number] DOB: 23-Jan-1961 DOA: 10/31/2023  PCP: [facility]  Admit date: 10/31/2023 Discharge date: 11/04/2023  Admitted From: Home Disposition: Home with home health  Recommendations for Outpatient Follow-up:  Follow up with PCP in 1-2 weeks Please obtain BMP/CBC in one week Neurology follow-up, referral sent  Home Health: PT/OT Equipment/Devices: Wheelchair  Discharge Condition: Fair CODE STATUS: Full code Diet recommendation: Low-salt diet  Discharge summary: 63 year old with history of hypertension, hyperlipidemia, type 2 diabetes, prior possible seizure episode who presented to the emergency room with 1 episode of witnessed seizure by her husband at home.  Patient was apparently awake all night taking care of her aunt, she had also taken a dose of oxycodone  in the morning, she walked out of the bathroom and collapsed.  Husband noted tonic-clonic seizure.  Patient woke up with EMS.  Noted to have postictal confusion.  Lasted about 2 minutes.  In the emergency room hemodynamically stable.  Found to have atrial tachycardia.  Also has chronic neck pain and radiculopathy to the right arm.  Patient with severe osteoarthritis both knees, difficulty with mobilities.  Going home today with wheelchair and outpatient follow-up.  Seizure disorder: Witnessed tonic-clonic seizure. MRI brain, without any acute findings. Spot EEG, without any acute seizure. Seen by neurology.  Suggested Keppra  500 mg twice daily.  Prescribed. Currently contraindication to driving.  All-time seizure precautions.  She will have follow-up with neurology as outpatient.  Referral sent.   Atrial tachycardia with dizziness: TSH normal. Echocardiogram essentially normal.  Normal ejection fraction. Heart rate optimally controlled with Cardizem  CD 180 mg daily, metoprolol  50 mg twice daily.  Cardiology will schedule outpatient follow-up.    Cervical  radiculopathy: CT scan of the cervical spine with severe osteoarthritis changes.  No spinal compression.  Pain medications.  Will send outpatient therapy on discharge. CT angiogram of the chest without evidence of PE.   Ambulatory dysfunction/bilateral severe osteoarthritis of the knees: Patient with significant pain and debility.  More pain and stiffness of the right knee. X-ray of the right knee with severe osteoarthritic changes. Patient with discharged home with Hydrocodone  5 mg every 4 hours as needed Flexeril  5 mg 3 times daily Diclofenac  locally. She does follow-up with pain in the sports clinic and she will follow-up. Will benefit with working with PT OT at home as well as wheelchair because of significant mobility problems.   Essential hypertension: As above.  Currently not on any blood pressure medications.  Discontinued lisinopril  to make room for rate control medications including Cardizem  and metoprolol .  Medically stable.  Significant mobility issue due to osteoarthritis of the both knees.  Pain management.  Safety precautions.  Fall precautions.  Home with family support system and outpatient follow-up.  Discharge Diagnoses:  Principal Problem:   Seizure [facility]) Active Problems:   HYPERTENSION, BENIGN ESSENTIAL   Diabetes mellitus (HCC)   Multifocal atrial tachycardia (HCC)   SIRS (systemic inflammatory response syndrome) (HCC)   Thickened endometrium    Discharge Instructions  Discharge Instructions     Diet - low sodium heart healthy   Complete by: As directed    Increase activity slowly   Complete by: As directed       Allergies as of 11/04/2023   No Known Allergies      Medication List     STOP taking these medications    lisinopril  5 MG tablet Commonly known as: ZESTRIL        TAKE these  medications    Acetaminophen  8 Hour 650 MG CR tablet Generic drug: acetaminophen  Take 1,300 mg by mouth every 12 (twelve) hours.   Biofreeze Roll-On 4 %  Gel Generic drug: Menthol (Topical Analgesic) Apply 1 application  topically daily.   cyclobenzaprine  5 MG tablet Commonly known as: FLEXERIL  Take 1 tablet (5 mg total) by mouth 3 (three) times daily.   Diclofenac  Sodium 3 % Gel Apply 1 Application topically 2 (two) times daily. Apply to painful areas   diltiazem  180 MG 24 hr capsule Commonly known as: CARDIZEM  CD Take 1 capsule (180 mg total) by mouth daily. Start taking on: Nov 05, 2023   HYDROcodone -acetaminophen  5-325 MG tablet Commonly known as: NORCO/VICODIN Take 1 tablet by mouth every 4 (four) hours as needed for up to 7 days. What changed: when to take this   levETIRAcetam  500 MG tablet Commonly known as: KEPPRA  Take 1 tablet (500 mg total) by mouth 2 (two) times daily.   metoprolol  tartrate 50 MG tablet Commonly known as: LOPRESSOR  Take 1 tablet (50 mg total) by mouth 2 (two) times daily.   [name]  15 MG/0.5ML Pen Generic drug: tirzepatide  SMARTSIG:15 Milligram(s) SUB-Q Once a Week   multivitamin with minerals Tabs tablet Take 1 tablet by mouth daily.   polyethylene glycol 17 g packet Commonly known as: MIRALAX  / GLYCOLAX  Take 17 g by mouth daily as needed for mild constipation.   potassium chloride  10 MEQ tablet Commonly known as: KLOR-CON  M Take 1 tablet (10 mEq total) by mouth daily. Start taking on: Nov 05, 2023   senna 8.6 MG Tabs tablet Commonly known as: SENOKOT Take 1 tablet (8.6 mg total) by mouth 2 (two) times daily. What changed: when to take this   thiamine  100 MG tablet Commonly known as: Vitamin B-1 Take 1 tablet (100 mg total) by mouth daily.               Durable Medical Equipment  (From admission, onward)           Start     Ordered   11/04/23 1044  For home use only DME standard manual wheelchair with seat cushion  Once       Comments: Patient suffers from knee effusion which impairs their ability to perform daily activities like bathing, dressing, and grooming in the  home.  A walker will not resolve issue with performing activities of daily living. A wheelchair will allow patient to safely perform daily activities. Patient can safely propel the wheelchair in the home or has a caregiver who can provide assistance. Length of need 6 months . Accessories: elevating leg rests (ELRs), wheel locks, extensions and anti-tippers.   11/04/23 1044            Follow-up Information     Pasqual Bone, MD Follow up in 1 week(s).   Specialty: Cardiology Contact information: [address] [place] [address] [phone]                No Known Allergies  Consultations: Neurology   Procedures/Studies: DG Knee 1-2 Views Right Result Date: 11/03/2023 CLINICAL DATA:  Osteoarthritis. EXAM: RIGHT KNEE - 2 VIEW COMPARISON:  None Available. FINDINGS: Osteopenia. Osteophyte formation seen of all 3 compartments greatest of the patellofemoral joint. There is some associated joint space loss. Small joint effusion. No fracture or dislocation. IMPRESSION: Tracking metal degenerative changes identified greatest of the patellofemoral joint. Joint effusion. Osteopenia. Electronically Signed   By: Adrianna Horde M.D.   On: 11/03/2023  14:31   ECHOCARDIOGRAM COMPLETE Result Date: 11/02/2023    ECHOCARDIOGRAM REPORT   Patient Name:   Valerie Carter [facility] Date of Exam: 11/01/2023 Medical Rec #:  [id number]           Height:       68.0 in Accession #:    [id number]          Weight:       250.0 lb Date of Birth:  August 11, 1960           BSA:          2.247 m Patient Age:    62 years            BP:           114/89 mmHg Patient Gender: F                   HR:           110 bpm. Exam Location:  Inpatient Procedure: 2D Echo, Cardiac Doppler and Color Doppler (Both Spectral and Color            Flow Doppler were utilized during procedure). Indications:     Abnormal ECG  History:         Patient has no prior history of Echocardiogram examinations.                  Risk Factors:Hypertension.   Sonographer:     Janette Medley Referring Phys:  [id number] TIMOTHY S OPYD Diagnosing Phys: Pasqual Bone MD IMPRESSIONS  1. Left ventricular ejection fraction, by estimation, is 55 to 60%. The left ventricle has normal function. The left ventricle has no regional wall motion abnormalities. Left ventricular diastolic parameters are consistent with Grade I diastolic dysfunction (impaired relaxation).  2. Right ventricular systolic function is normal. The right ventricular size is normal.  3. Left atrial size was moderately dilated.  4. Right atrial size was moderately dilated.  5. The mitral valve is degenerative. Trivial mitral valve regurgitation.  6. The aortic valve is tricuspid. Aortic valve regurgitation is trivial. Aortic valve sclerosis is present, with no evidence of aortic valve stenosis.  7. The inferior vena cava is normal in size with greater than 50% respiratory variability, suggesting right atrial pressure of 3 mmHg. FINDINGS  Left Ventricle: Left ventricular ejection fraction, by estimation, is 55 to 60%. The left ventricle has normal function. The left ventricle has no regional wall motion abnormalities. The left ventricular internal cavity size was normal in size. There is  borderline concentric left ventricular hypertrophy. Left ventricular diastolic parameters are consistent with Grade I diastolic dysfunction (impaired relaxation). Right Ventricle: The right ventricular size is normal. No increase in right ventricular wall thickness. Right ventricular systolic function is normal. Left Atrium: Left atrial size was moderately dilated. Right Atrium: Right atrial size was moderately dilated. Pericardium: There is no evidence of pericardial effusion. Mitral Valve: The mitral valve is degenerative in appearance. There is moderate calcification of the mitral valve leaflet(s). Mild mitral annular calcification. Trivial mitral valve regurgitation. Tricuspid Valve: The tricuspid valve is normal in structure.  Tricuspid valve regurgitation is trivial. Aortic Valve: The aortic valve is tricuspid. Aortic valve regurgitation is trivial. Aortic valve sclerosis is present, with no evidence of aortic valve stenosis. Pulmonic Valve: The pulmonic valve was normal in structure. Pulmonic valve regurgitation is not visualized. Aorta: The aortic root is normal in size and structure. There is minimal (Grade I) atheroma plaque involving the aortic root. Venous:  The inferior vena cava is normal in size with greater than 50% respiratory variability, suggesting right atrial pressure of 3 mmHg. IAS/Shunts: The atrial septum is grossly normal.  LEFT VENTRICLE PLAX 2D LVIDd:         4.60 cm LVIDs:         3.20 cm LV PW:         1.00 cm LV IVS:        1.00 cm LVOT diam:     2.20 cm LV SV:         74 LV SV Index:   33 LVOT Area:     3.80 cm  RIGHT VENTRICLE             IVC RV S prime:     13.70 cm/s  IVC diam: 1.40 cm TAPSE (M-mode): 2.0 cm LEFT ATRIUM             Index        RIGHT ATRIUM           Index LA Vol (A2C):   85.3 ml 37.96 ml/m  RA Area:     23.90 cm LA Vol (A4C):   66.8 ml 29.73 ml/m  RA Volume:   76.90 ml  34.22 ml/m LA Biplane Vol: 78.9 ml 35.11 ml/m  AORTIC VALVE LVOT Vmax:   118.00 cm/s LVOT Vmean:  80.500 cm/s LVOT VTI:    0.194 m  AORTA Ao Root diam: 2.50 cm  SHUNTS Systemic VTI:  0.19 m Systemic Diam: 2.20 cm Pasqual Bone MD Electronically signed by Pasqual Bone MD Signature Date/Time: 11/02/2023/9:25:03 AM    Final    CT CERVICAL SPINE WO CONTRAST Result Date: 11/02/2023 CLINICAL DATA:  Cervical radiculopathy without red flag symptoms EXAM: CT CERVICAL SPINE WITHOUT CONTRAST TECHNIQUE: Multidetector CT imaging of the cervical spine was performed without intravenous contrast. Multiplanar CT image reconstructions were also generated. RADIATION DOSE REDUCTION: This exam was performed according to the departmental dose-optimization program which includes automated exposure control, adjustment of the mA and/or kV  according to patient size and/or use of iterative reconstruction technique. COMPARISON:  None Available. FINDINGS: Alignment: Physiologic Skull base and vertebrae: No evidence of fracture or bone lesion. Soft tissues and spinal canal: No evidence of perispinal mass or inflammation. Disc levels: Atlantal dental degenerative spurring with inferior enthesophyte from the anterior arch of C1. Diffusely preserved disc height with mild ventral endplate spurring at C4-5 and C5-6. No significant facet spurring. No visible herniation. No bony impingement. Upper chest: Negative IMPRESSION: No acute finding. Mild degenerative spurring without bony impingement throughout the cervical spine. Electronically Signed   By: Ronnette Coke M.D.   On: 11/02/2023 06:21   CT Angio Chest Pulmonary Embolism (PE) W or WO Contrast Result Date: 11/01/2023 CLINICAL DATA:  Pulmonary embolism (PE) suspected, high prob. Difficulty breathing. EXAM: CT ANGIOGRAPHY CHEST WITH CONTRAST TECHNIQUE: Multidetector CT imaging of the chest was performed using the standard protocol during bolus administration of intravenous contrast. Multiplanar CT image reconstructions and MIPs were obtained to evaluate the vascular anatomy. RADIATION DOSE REDUCTION: This exam was performed according to the departmental dose-optimization program which includes automated exposure control, adjustment of the mA and/or kV according to patient size and/or use of iterative reconstruction technique. CONTRAST:  75mL OMNIPAQUE  IOHEXOL  350 MG/ML SOLN COMPARISON:  None Available. FINDINGS: Cardiovascular: No filling defects in the pulmonary arteries to suggest pulmonary emboli. Heart is normal size. Aorta is normal caliber. Few scattered calcifications in the left anterior descending coronary  artery. Mediastinum/Nodes: No mediastinal, hilar, or axillary adenopathy. Trachea and esophagus are unremarkable. Thyroid unremarkable. Lungs/Pleura: Linear subsegmental atelectasis in the  lung bases/lower lobes. No confluent opacities or effusions. Upper Abdomen: Small hiatal hernia.  No acute findings. Musculoskeletal: Chest wall soft tissues are unremarkable. No acute bony abnormality. Review of the MIP images confirms the above findings. IMPRESSION: No evidence of pulmonary embolus. Few scattered calcifications in the left anterior descending coronary artery. Bibasilar atelectasis. Electronically Signed   By: Janeece Mechanic M.D.   On: 11/01/2023 18:32   EEG adult Result Date: 11/01/2023 [name], MD     11/01/2023  8:40 AM Patient Name: Valerie Carter MRN: [id number] Epilepsy Attending: [name] Referring Physician/Provider: Augustin Leber, MD Date: 11/01/2023 Duration: 25.16 mins Patient history: 63 y.o. female with a history of a single previous episode highly concerning for seizure who presents with a seizure witnessed by coworker. EEG to evaluate for seizure Level of alertness: Awake, asleep AEDs during EEG study: LEV Technical aspects: This EEG study was done with scalp electrodes positioned according to the 10-20 International system of electrode placement. Electrical activity was reviewed with band pass filter of 1-70Hz , sensitivity of 7 uV/mm, display speed of 26mm/sec with a 60Hz  notched filter applied as appropriate. EEG data were recorded continuously and digitally stored.  Video monitoring was available and reviewed as appropriate. Description: The posterior dominant rhythm consists of 8 Hz activity of moderate voltage (25-35 uV) seen predominantly in posterior head regions, symmetric and reactive to eye opening and eye closing. Sleep was characterized by vertex waves, sleep spindles (12 to 14 Hz), maximal frontocentral region. Physiologic photic driving was not seen during photic stimulation.  Hyperventilation was not performed.   IMPRESSION: This study is within normal limits. No seizures or epileptiform discharges were seen throughout the recording. A normal  interictal EEG does not exclude the diagnosis of epilepsy. [name]   MR BRAIN WO CONTRAST Result Date: 11/01/2023 CLINICAL DATA:  Seizure, new-onset, no history of trauma EXAM: MRI HEAD WITHOUT CONTRAST TECHNIQUE: Multiplanar, multiecho pulse sequences of the brain and surrounding structures were obtained without intravenous contrast. COMPARISON:  CT head Oct 31, 2023. FINDINGS: Brain: No acute infarction, hemorrhage, hydrocephalus, extra-axial collection or mass lesion. Mild to moderate scattered T2/FLAIR hyperintensities in the white matter, which are nonspecific but compatible with chronic microvascular ischemic change. The hippocampi are symmetric and within normal limits. Vascular: Major arterial flow voids are maintained at the skull base. Skull and upper cervical spine: Normal marrow signal. Sinuses/Orbits: Near-total opacification of the left maxillary sinus. No acute orbital findings. Other: No mastoid effusions. IMPRESSION: 1. No evidence of acute intracranial abnormality. 2. Mild to moderate chronic microvascular ischemic change. 3. Near-total opacification of the left maxillary sinus. Electronically Signed   By: Stevenson Elbe M.D.   On: 11/01/2023 01:31   DG Chest Portable 1 View Result Date: 10/31/2023 CLINICAL DATA:  Difficulty breathing EXAM: PORTABLE CHEST 1 VIEW COMPARISON:  05/12/2021 FINDINGS: Cardiac shadow is within normal limits. The lungs are well aerated bilaterally. No focal infiltrate or effusion is seen. No bony abnormality is noted. IMPRESSION: No acute abnormality seen. Electronically Signed   By: Violeta Grey M.D.   On: 10/31/2023 21:43   CT Head Wo Contrast Result Date: 10/31/2023 CLINICAL DATA:  Seizure, new onset, no history of trauma EXAM: CT HEAD WITHOUT CONTRAST TECHNIQUE: Contiguous axial images were obtained from the base of the skull through the vertex without intravenous contrast. RADIATION DOSE REDUCTION:  This exam was performed according to the departmental  dose-optimization program which includes automated exposure control, adjustment of the mA and/or kV according to patient size and/or use of iterative reconstruction technique. COMPARISON:  CT head 03/25/2021 FINDINGS: Brain: No intracranial hemorrhage, mass effect, or evidence of acute infarct. No hydrocephalus. No extra-axial fluid collection. Vascular: No hyperdense vessel or unexpected calcification. Skull: No fracture or focal lesion. Sinuses/Orbits: Marked mucosal thickening in the left maxillary sinus with air-fluid level. Left greater than right mucosal thickening in the ethmoid air cells and frontal sinuses. No mastoid effusion. Other: None. IMPRESSION: 1. No acute intracranial abnormality. 2. Paranasal sinus mucosal thickening greatest in the left maxillary sinus with air-fluid level. Correlate for acute sinusitis. Electronically Signed   By: Rozell Cornet M.D.   On: 10/31/2023 20:05   CT ABDOMEN PELVIS W CONTRAST Result Date: 10/31/2023 CLINICAL DATA:  Sepsis, left upper quadrant pain, hematuria EXAM: CT ABDOMEN AND PELVIS WITH CONTRAST TECHNIQUE: Multidetector CT imaging of the abdomen and pelvis was performed using the standard protocol following bolus administration of intravenous contrast. RADIATION DOSE REDUCTION: This exam was performed according to the departmental dose-optimization program which includes automated exposure control, adjustment of the mA and/or kV according to patient size and/or use of iterative reconstruction technique. CONTRAST:  85mL OMNIPAQUE  IOHEXOL  350 MG/ML SOLN COMPARISON:  None Available. FINDINGS: Lower chest: No acute abnormality Hepatobiliary: No focal hepatic abnormality. Gallbladder unremarkable. Pancreas: No focal abnormality or ductal dilatation. Spleen: No focal abnormality.  Normal size. Adrenals/Urinary Tract: No adrenal abnormality. No focal renal abnormality. No stones or hydronephrosis. Urinary bladder is unremarkable. Stomach/Bowel: Normal appendix. Prior  gastric sleeve. Small hiatal hernia. No bowel obstruction or inflammatory process. Vascular/Lymphatic: Aortic atherosclerosis. No evidence of aneurysm or adenopathy. Reproductive: Endometrium thickened for post cholecystectomy state, 14 mm. Uterus appears diffusely enlarged. This may be related to fibroids, but no well-defined fibroid visible by CT. No adnexal mass. Other: Trace free fluid in the cul-de-sac. No free air. Small umbilical hernia containing fat. Musculoskeletal: No acute bony abnormality. IMPRESSION: Thickened endometrium, 14 mm. Uterus appears diffusely enlarged without well-defined fibroid. Cannot exclude endometrial cancer and/or infiltrating process. Consider further evaluation with pelvic ultrasound or MRI. Prior gastric sleeve. Aortic atherosclerosis. Small umbilical hernia containing fat. Electronically Signed   By: Janeece Mechanic M.D.   On: 10/31/2023 20:05   US  Abdomen Limited RUQ (LIVER/GB) Result Date: 10/28/2023 CLINICAL DATA:  Abnormal liver function test. EXAM: ULTRASOUND ABDOMEN LIMITED RIGHT UPPER QUADRANT COMPARISON:  None Available. FINDINGS: Gallbladder: No gallstones or wall thickening visualized. No sonographic Murphy sign noted by sonographer. Common bile duct: Diameter: 4.8 mm Liver: No focal lesion identified. Within normal limits in parenchymal echogenicity. Portal vein is patent on color Doppler imaging with normal direction of blood flow towards the liver. Other: None. IMPRESSION: Normal right upper quadrant ultrasound. Electronically Signed   By: Anna Barnes M.D.   On: 10/28/2023 11:12   (Echo, Carotid, EGD, Colonoscopy, ERCP)    Subjective: Patient seen and examined.  No overnight events.  Pain is controlled however next to possible to walk on right knee.  Wants to go home.  She thinks she will be more comfortable at home.   Discharge Exam: Vitals:   11/04/23 0357 11/04/23 0744  BP: 125/80 130/72  Pulse: (!) 101 (!) 101  Resp: 18 16  Temp: 98.3 F (36.8 C)  98.4 F (36.9 C)  SpO2: 100% 98%   Vitals:   11/03/23 2232 11/04/23 0003 11/04/23 0357 11/04/23 0744  BP:  138/79 138/83 125/80 130/72  Pulse: (!) 102 (!) 102 (!) 101 (!) 101  Resp:  16 18 16   Temp:  98.2 F (36.8 C) 98.3 F (36.8 C) 98.4 F (36.9 C)  TempSrc:  Oral Oral Oral  SpO2:  98% 100% 98%  Weight:      Height:        General: Pt is alert, awake, not in acute distress Cardiovascular: RRR, S1/S2 +, no rubs, no gallops Respiratory: CTA bilaterally, no wheezing, no rhonchi Abdominal: Soft, NT, ND, bowel sounds + Extremities: no edema, no cyanosis Bilateral knees without any erythema, effusion or edema but restricted mobility due to pain.     The results of significant diagnostics from this hospitalization (including imaging, microbiology, ancillary and laboratory) are listed below for reference.     Microbiology: No results found for this or any previous visit (from the past 240 hours).   Labs: BNP (last 3 results) Recent Labs    10/31/23 2300  BNP 87.5   Basic Metabolic Panel: Recent Labs  Lab 10/31/23 1659 11/01/23 0444 11/02/23 0543 11/03/23 0718 11/04/23 0825  NA 139 141 138 134* 138  K 3.8 3.3* 4.3 4.3 4.5  CL 105 111 109 105 108  CO2 18* 21* 21* 19* 16*  GLUCOSE 115* 112* 120* 107* 76  BUN 11 9 11 10 10   CREATININE 0.76 0.78 1.08* 0.78 0.78  CALCIUM  9.4 8.4* 8.7* 8.6* 9.1  MG 1.6* 1.8 1.8  --   --   PHOS  --  2.9  --   --   --    Liver Function Tests: Recent Labs  Lab 10/31/23 1659  AST 29  ALT 19  ALKPHOS 87  BILITOT 1.5*  PROT 7.7  ALBUMIN 3.2*   No results for input(s): "LIPASE", "AMYLASE" in the last 168 hours. No results for input(s): "AMMONIA" in the last 168 hours. CBC: Recent Labs  Lab 10/31/23 1659 11/01/23 0444 11/02/23 0543 11/03/23 0718  WBC 16.6* 17.7* 11.5* 11.5*  NEUTROABS 14.2*  --   --   --   HGB 12.9 10.6* 10.2* 10.2*  HCT 42.2 34.4* 33.2* 33.4*  MCV 80.8 81.1 80.4 81.3  PLT 253 250 226 243   Cardiac  Enzymes: No results for input(s): "CKTOTAL", "CKMB", "CKMBINDEX", "TROPONINI" in the last 168 hours. BNP: Invalid input(s): "POCBNP" CBG: Recent Labs  Lab 10/31/23 1715 11/01/23 1642  GLUCAP 114* 134*   D-Dimer No results for input(s): "DDIMER" in the last 72 hours. Hgb A1c Recent Labs    11/02/23 0543  HGBA1C 5.0   Lipid Profile No results for input(s): "CHOL", "HDL", "LDLCALC", "TRIG", "CHOLHDL", "LDLDIRECT" in the last 72 hours. Thyroid function studies No results for input(s): "TSH", "T4TOTAL", "T3FREE", "THYROIDAB" in the last 72 hours.  Invalid input(s): "FREET3" Anemia work up No results for input(s): "VITAMINB12", "FOLATE", "FERRITIN", "TIBC", "IRON ", "RETICCTPCT" in the last 72 hours. Urinalysis    Component Value Date/Time   COLORURINE YELLOW 10/31/2023 1659   APPEARANCEUR HAZY (A) 10/31/2023 1659   LABSPEC 1.010 10/31/2023 1659   PHURINE 6.0 10/31/2023 1659   GLUCOSEU NEGATIVE 10/31/2023 1659   HGBUR MODERATE (A) 10/31/2023 1659   HGBUR small 12/04/2008 0950   BILIRUBINUR NEGATIVE 10/31/2023 1659   BILIRUBINUR negative 03/16/2018 1009   BILIRUBINUR NEG 08/14/2015 1055   KETONESUR 20 (A) 10/31/2023 1659   PROTEINUR NEGATIVE 10/31/2023 1659   UROBILINOGEN 0.2 03/16/2018 1009   UROBILINOGEN 1.0 12/04/2008 0950   NITRITE NEGATIVE 10/31/2023 1659   LEUKOCYTESUR NEGATIVE  10/31/2023 1659   Sepsis Labs Recent Labs  Lab 10/31/23 1659 11/01/23 0444 11/02/23 0543 11/03/23 0718  WBC 16.6* 17.7* 11.5* 11.5*   Microbiology No results found for this or any previous visit (from the past 240 hours).   Time coordinating discharge: 35 minutes  SIGNED:   Vada Garibaldi, MD  [facility] 11/04/2023, 10:44 AM

## 2023-11-04 NOTE — Progress Notes (Signed)
    Durable Medical Equipment  (From admission, onward)           Start     Ordered   11/04/23 1044  For home use only DME standard manual wheelchair with seat cushion  Once       Comments: Patient suffers from knee effusion which impairs their ability to perform daily activities like bathing, dressing, and grooming in the home.  A walker will not resolve issue with performing activities of daily living. A wheelchair will allow patient to safely perform daily activities. Patient can safely propel the wheelchair in the home or has a caregiver who can provide assistance. Length of need 6 months . Accessories: elevating leg rests (ELRs), wheel locks, extensions and anti-tippers.   11/04/23 1044

## 2023-11-04 NOTE — Consult Note (Signed)
 Ref: [facility]   Subjective:  No new complaint but did not walk this AM. HH arranged as patient refuses SNF. Monitor sinus rhythm with HR around 100/min. H/O iron  deficiency anemia in past.  Objective:  Vital Signs in the last 24 hours: Temp:  [98.2 F (36.8 C)-98.5 F (36.9 C)] 98.4 F (36.9 C) (05/09 0744) Pulse Rate:  [101-106] 101 (05/09 0744) Cardiac Rhythm: Sinus tachycardia (05/09 0730) Resp:  [14-18] 16 (05/09 0744) BP: (105-138)/(64-83) 130/72 (05/09 0744) SpO2:  [97 %-100 %] 98 % (05/09 0744)  Physical Exam: BP Readings from Last 1 Encounters:  11/04/23 130/72     Wt Readings from Last 1 Encounters:  10/31/23 113.4 kg    Weight change:  Body mass index is 38.01 kg/m. HEENT: Crane/AT, Eyes-Brown, Conjunctiva-Pink, Sclera-Non-icteric Neck: No JVD, No bruit, Trachea midline. Lungs:  Clear, Bilateral. Cardiac:  Regular rhythm, normal S1 and S2, no S3. II/VI systolic murmur. Abdomen:  Soft, non-tender. BS present. Extremities:  Trace edema present. No cyanosis. No clubbing. CNS: AxOx3, Cranial nerves grossly intact, moves all 4 extremities.  Skin: Warm and dry.   Intake/Output from previous day: 05/08 0701 - 05/09 0700 In: 1031 [I.V.:1031] Out: 950 [Urine:950]    Lab Results: BMET    Component Value Date/Time   NA 138 11/04/2023 0825   NA 134 (L) 11/03/2023 0718   NA 138 11/02/2023 0543   NA 140 12/14/2021 1656   NA 142 06/02/2021 1336   NA 141 05/18/2021 1206   K 4.5 11/04/2023 0825   K 4.3 11/03/2023 0718   K 4.3 11/02/2023 0543   CL 108 11/04/2023 0825   CL 105 11/03/2023 0718   CL 109 11/02/2023 0543   CO2 16 (L) 11/04/2023 0825   CO2 19 (L) 11/03/2023 0718   CO2 21 (L) 11/02/2023 0543   GLUCOSE 76 11/04/2023 0825   GLUCOSE 107 (H) 11/03/2023 0718   GLUCOSE 120 (H) 11/02/2023 0543   BUN 10 11/04/2023 0825   BUN 10 11/03/2023 0718   BUN 11 11/02/2023 0543   BUN 14 12/14/2021 1656   BUN 19 06/02/2021 1336   BUN 15  05/18/2021 1206   CREATININE 0.78 11/04/2023 0825   CREATININE 0.78 11/03/2023 0718   CREATININE 1.08 (H) 11/02/2023 0543   CREATININE 1.03 07/23/2016 1108   CREATININE 0.77 02/06/2015 0929   CREATININE 0.79 05/06/2014 1114   CALCIUM  9.1 11/04/2023 0825   CALCIUM  8.6 (L) 11/03/2023 0718   CALCIUM  8.7 (L) 11/02/2023 0543   GFRNONAA >60 11/04/2023 0825   GFRNONAA >60 11/03/2023 0718   GFRNONAA 58 (L) 11/02/2023 0543   GFRNONAA 61 07/23/2016 1108   GFRAA 113 05/15/2018 1123   GFRAA >60 03/09/2018 0710   GFRAA 108 02/10/2018 0939   GFRAA 71 07/23/2016 1108   CBC    Component Value Date/Time   WBC 11.5 (H) 11/03/2023 0718   RBC 4.11 11/03/2023 0718   HGB 10.2 (L) 11/03/2023 0718   HGB 11.9 12/14/2021 1656   HCT 33.4 (L) 11/03/2023 0718   HCT 38.6 12/14/2021 1656   PLT 243 11/03/2023 0718   PLT 287 12/14/2021 1656   MCV 81.3 11/03/2023 0718   MCV 76 (L) 12/14/2021 1656   MCH 24.8 (L) 11/03/2023 0718   MCHC 30.5 11/03/2023 0718   RDW 14.8 11/03/2023 0718   RDW 14.6 12/14/2021 1656   LYMPHSABS 1.3 10/31/2023 1659   LYMPHSABS 1.9 05/18/2021 1206   MONOABS 0.9 10/31/2023 1659   EOSABS 0.0 10/31/2023 1659  EOSABS 0.2 05/18/2021 1206   BASOSABS 0.0 10/31/2023 1659   BASOSABS 0.0 05/18/2021 1206   HEPATIC Function Panel Recent Labs    03/16/23 2012 03/17/23 0446 10/31/23 1659  PROT 8.4* 7.6 7.7  ALBUMIN 3.4* 3.1* 3.2*  AST 19 16 29   ALT 14 12 19   ALKPHOS 67 60 87   HEMOGLOBIN A1C Lab Results  Component Value Date   MPG 96.8 11/02/2023   CARDIAC ENZYMES Lab Results  Component Value Date   CKTOTAL 177 01/20/2008   CKMB 3.3 01/20/2008   BNP No results for input(s): "PROBNP" in the last 8760 hours. TSH Recent Labs    11/01/23 0444  TSH 0.623   CHOLESTEROL No results for input(s): "CHOL" in the last 8760 hours.  Scheduled Meds:  vitamin C  250 mg Oral Daily   cyclobenzaprine   5 mg Oral TID   diltiazem   180 mg Oral Daily   enoxaparin  (LOVENOX ) injection   40 mg Subcutaneous Q24H   ferrous sulfate   325 mg Oral Q breakfast   levETIRAcetam   500 mg Oral BID   lidocaine   1 patch Transdermal Q24H   metoprolol  tartrate  50 mg Oral BID   potassium chloride   10 mEq Oral Daily   sodium chloride  flush  3 mL Intravenous Q12H   Continuous Infusions: PRN Meds:.acetaminophen  **OR** acetaminophen , oxyCODONE , prochlorperazine , senna-docusate  Assessment/Plan: Sinus tachycardia Seizure disorder Acute sinusitis HTN HLD Obesity Iron  deficiency anemia  Plan: Add Ferrous sulfate  with vitamin C. F/U in 1-2 weeks.   LOS: 3 days   Time spent including chart review, lab review, examination, discussion with patient/Nurse : 30 min   Valerie Bone  MD  11/04/2023, 10:56 AM

## 2023-11-04 NOTE — Plan of Care (Signed)

## 2023-11-24 ENCOUNTER — Encounter: Payer: Self-pay | Admitting: Hematology and Oncology

## 2023-12-01 ENCOUNTER — Encounter: Payer: Self-pay | Admitting: Neurology

## 2023-12-01 ENCOUNTER — Ambulatory Visit: Admitting: Neurology

## 2023-12-01 ENCOUNTER — Encounter: Payer: Self-pay | Admitting: Hematology and Oncology

## 2023-12-01 VITALS — BP 139/64 | HR 66 | Resp 15 | Ht 65.0 in

## 2023-12-01 DIAGNOSIS — R569 Unspecified convulsions: Secondary | ICD-10-CM | POA: Diagnosis not present

## 2023-12-01 MED ORDER — LEVETIRACETAM 250 MG PO TABS: 250.0000 mg | ORAL_TABLET | Freq: Two times a day (BID) | ORAL | 3 refills | Status: AC

## 2023-12-01 NOTE — Progress Notes (Signed)
 [facility]  PATIENT: Valerie Carter DOB: 04-18-1961  REQUESTING CLINICIAN: Vada Garibaldi, MD HISTORY FROM: Patient and husband  REASON FOR VISIT: Seizure    HISTORICAL  CHIEF COMPLAINT:  Chief Complaint  Patient presents with   New Patient (Initial Visit)    Rm12, husband present, NP internal referral for Seizure: one sz in her life never started the keppra  that was prescribed    HISTORY OF PRESENT ILLNESS:  This is 63 year old woman past medical history of hypertension, hyperlipidemia, obesity who is presenting after a seizure on May 5.  Patient is a bus driver, states that morning she did not feel well but went to her usual ride.  After stopping the bus, she was feeling cold, asked the students to roll the window up in the next thing that she remembers is waking up in the hospital.  She was told by coworkers that she has a generalized convulsion, foaming at the mouth with tongue biting.  There was also report of confusion at the scene.  In the hospital, she did have an MRI brain with no acute abnormality and a normal routine EEG.   On further history patient had a similar event in September 2022 witnessed by her husband.  The husband tells me the patient fell backward, had generalized shaking, foaming at the mouth with tongue biting, there were no urinary incontinence.  She was taken to the hospital diagnosed with syncope versus seizure.   At discharge from the hospital she was recommended levetiracetam  500 mg twice daily but patient has not been taking the medication.  Currently she is not taking any medication for seizure.   She tells me she was started on 2 medications for blood pressure, prior to going to the hospital she was only one  lisinopril  but she was discharged on Cardizem  and Lopressor  because of rapid heart rate.  She tells me taking those 2 medications makes her dizzy, lightheaded therefore she only takes the Lopressor , actually half dose, 25 mg  instead of 50 mg.  She does not have a PCP to follow-up with.  Handedness: Right handed   Onset: First event September 2022, then last on 10/31/2023  Seizure Type: Generalized convulsion   Current frequency: Only 2 events   Any injuries from seizures: Tongue biting, right leg pain   Seizure risk factors: Aunt with seizure, concussion  Previous ASMs: None   Currenty ASMs: None   ASMs side effects: N/A  Brain Images: No acute findings   Previous EEGs: Normal routine    OTHER MEDICAL CONDITIONS: Hypertension, Obesity,   REVIEW OF SYSTEMS: Full 14 system review of systems performed and negative with exception of: As noted in the HPI   ALLERGIES: No Known Allergies  HOME MEDICATIONS: Outpatient Medications Prior to Visit  Medication Sig Dispense Refill   acetaminophen  (ACETAMINOPHEN  8 HOUR) 650 MG CR tablet Take 1,300 mg by mouth every 12 (twelve) hours.     cyclobenzaprine  (FLEXERIL ) 5 MG tablet Take 1 tablet (5 mg total) by mouth 3 (three) times daily. 30 tablet 0   Diclofenac  Sodium 3 % GEL Apply 1 Application topically 2 (two) times daily. Apply to painful areas 100 g 2   Menthol, Topical Analgesic, (BIOFREEZE ROLL-ON) 4 % GEL Apply 1 application  topically daily.     metoprolol  tartrate (LOPRESSOR ) 50 MG tablet Take 1 tablet (50 mg total) by mouth 2 (two) times daily. 60 tablet 2   Multiple Vitamin (MULTIVITAMIN WITH MINERALS) TABS tablet Take 1 tablet  by mouth daily.     senna (SENOKOT) 8.6 MG TABS tablet Take 1 tablet (8.6 mg total) by mouth 2 (two) times daily. (Patient taking differently: Take 1 tablet by mouth daily.) 60 tablet 1   thiamine  (VITAMIN B-1) 100 MG tablet Take 1 tablet (100 mg total) by mouth daily. 30 tablet 1   ascorbic acid  (VITAMIN C ) 250 MG tablet Take 1 tablet (250 mg total) by mouth daily. 90 tablet 3   diltiazem  (CARDIZEM  CD) 180 MG 24 hr capsule Take 1 capsule (180 mg total) by mouth daily. 30 capsule 2   ferrous sulfate  325 (65 FE) MG tablet Take  1 tablet (325 mg total) by mouth daily with breakfast. 90 tablet 3   levETIRAcetam  (KEPPRA ) 500 MG tablet Take 1 tablet (500 mg total) by mouth 2 (two) times daily. 180 tablet 0   [name]  15 MG/0.5ML Pen SMARTSIG:15 Milligram(s) SUB-Q Once a Week     polyethylene glycol (MIRALAX  / GLYCOLAX ) 17 g packet Take 17 g by mouth daily as needed for mild constipation. 30 each 0   potassium chloride  (KLOR-CON  M) 10 MEQ tablet Take 1 tablet (10 mEq total) by mouth daily. 90 tablet 0   No facility-administered medications prior to visit.    PAST MEDICAL HISTORY: 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   Diabetes mellitus without complication (HCC)    type 2   DJD (degenerative joint disease)    both knees right worse than left   Headache    Hyperlipidemia    Hypertension    Intramural leiomyoma of uterus    Joint pain    Knee pain    Lactose intolerance    Lower extremity edema    Morbid obesity with BMI of 50.0-59.9, adult (HCC)    Osteoarthritis    Vitamin D  deficiency     PAST SURGICAL HISTORY: Past Surgical History:  Procedure Laterality Date   BREAST BIOPSY Left 01/04/2023   US  LT BREAST BX W LOC DEV 1ST LESION IMG BX SPEC US  GUIDE 01/04/2023 [facility]   KNEE SURGERY Right 1999   torn cartlidge repair   LAPAROSCOPIC GASTRIC SLEEVE RESECTION N/A 06/07/2017   Procedure: LAPAROSCOPIC GASTRIC SLEEVE RESECTION, UPPER ENDOSCOPY;  Surgeon: Dorrie Gaudier, Alphonso Aschoff, MD;  Location: [facility];  Service: General;  Laterality: N/A;    FAMILY HISTORY: Family History  Problem Relation Age of Onset   Diabetes Sister    Hypertension Sister    Diabetes Maternal Aunt    Breast cancer Maternal Aunt 29   Hypertension Mother    Diabetes Father     SOCIAL HISTORY: Social History   Socioeconomic History   Marital status: Single    Spouse name: Not on file   Number of children: 1   Years of  education: Not on file   Highest education level: Associate degree: occupational, Scientist, product/process development, or vocational program  Occupational History   Occupation: school bus driver  Tobacco Use   Smoking status: Never   Smokeless tobacco: Never  Vaping Use   Vaping status: Never Used  Substance and Sexual Activity   Alcohol use: Yes    Alcohol/week: 14.0 standard drinks of alcohol    Types: 14 Cans of beer per week   Drug use: No   Sexual activity: Never  Other Topics Concern   Not on file  Social History  Narrative   Not on file   Social Drivers of Health   Financial Resource Strain: Low Risk  (10/13/2023)   Received from [facility]   Overall Financial Resource Strain (CARDIA)    Difficulty of Paying Living Expenses: Not hard at all  Food Insecurity: No Food Insecurity (11/01/2023)   Hunger Vital Sign    Worried About Running Out of Food in the Last Year: Never true    Ran Out of Food in the Last Year: Never true  Transportation Needs: No Transportation Needs (11/01/2023)   PRAPARE - Administrator, Civil Service (Medical): No    Lack of Transportation (Non-Medical): No  Physical Activity: Unknown (10/18/2022)   Exercise Vital Sign    Days of Exercise per Week: 0 days    Minutes of Exercise per Session: Not on file  Stress: Stress Concern Present (10/18/2022)   [employer] of Occupational Health - Occupational Stress Questionnaire    Feeling of Stress : To some extent  Social Connections: Unknown (03/17/2023)   [facility]   Social Network    Social Network: Not on file  Intimate Partner Violence: Not At Risk (11/01/2023)   Humiliation, Afraid, Rape, and Kick questionnaire    Fear of Current or Ex-Partner: No    Emotionally Abused: No    Physically Abused: No    Sexually Abused: No    PHYSICAL EXAM  GENERAL EXAM/CONSTITUTIONAL: Vitals:  Vitals:   12/01/23 1316  BP: 139/64  Pulse: 66  Resp: 15  SpO2: 97%  Height: 5\' 5"  (1.651 m)   Body  mass index is 41.6 kg/m. Wt Readings from Last 3 Encounters:  10/31/23 250 lb (113.4 kg)  09/08/23 (!) 306 lb (138.8 kg)  06/07/23 292 lb 12.8 oz (132.8 kg)   Patient is in no distress; well developed, nourished and groomed; neck is supple  MUSCULOSKELETAL: Gait, strength, tone, movements noted in Neurologic exam below  NEUROLOGIC: MENTAL STATUS:      No data to display         awake, alert, oriented to person, place and time recent and remote memory intact normal attention and concentration language fluent, comprehension intact, naming intact fund of knowledge appropriate  CRANIAL NERVE:  2nd, 3rd, 4th, 6th - Visual fields full to confrontation, extraocular muscles intact, no nystagmus 5th - facial sensation symmetric 7th - facial strength symmetric 8th - hearing intact 9th - palate elevates symmetrically, uvula midline 11th - shoulder shrug symmetric 12th - tongue protrusion midline  MOTOR:  normal bulk and tone, full strength in the BUE, BLE  SENSORY:  normal and symmetric to light touch  COORDINATION:  finger-nose-finger, fine finger movements normal  GAIT/STATION:  Deferred    DIAGNOSTIC DATA (LABS, IMAGING, TESTING) - I reviewed patient records, labs, notes, testing and imaging myself where available.  Lab Results  Component Value Date   WBC 11.5 (H) 11/03/2023   HGB 10.2 (L) 11/03/2023   HCT 33.4 (L) 11/03/2023   MCV 81.3 11/03/2023   PLT 243 11/03/2023      Component Value Date/Time   NA 138 11/04/2023 0825   NA 140 12/14/2021 1656   K 4.5 11/04/2023 0825   CL 108 11/04/2023 0825   CO2 16 (L) 11/04/2023 0825   GLUCOSE 76 11/04/2023 0825   BUN 10 11/04/2023 0825   BUN 14 12/14/2021 1656   CREATININE 0.78 11/04/2023 0825   CREATININE 1.03 07/23/2016 1108   CALCIUM  9.1 11/04/2023 0825   PROT 7.7  10/31/2023 1659   PROT 7.9 12/14/2021 1656   ALBUMIN 3.2 (L) 10/31/2023 1659   ALBUMIN 4.0 12/14/2021 1656   AST 29 10/31/2023 1659   ALT 19  10/31/2023 1659   ALKPHOS 87 10/31/2023 1659   BILITOT 1.5 (H) 10/31/2023 1659   BILITOT 0.7 12/14/2021 1656   GFRNONAA >60 11/04/2023 0825   GFRNONAA 61 07/23/2016 1108   GFRAA 113 05/15/2018 1123   GFRAA 71 07/23/2016 1108   Lab Results  Component Value Date   CHOL 157 10/18/2022   HDL 64 10/18/2022   LDLCALC 81 10/18/2022   LDLDIRECT 114 (H) 09/07/2011   TRIG 59 10/18/2022   Lab Results  Component Value Date   HGBA1C 5.0 11/02/2023   Lab Results  Component Value Date   VITAMINB12 1,654 (H) 03/17/2023   Lab Results  Component Value Date   TSH 0.623 11/01/2023   rEEG 11/01/2023 Normal   MRI Brain 10/31/2023 1. No evidence of acute intracranial abnormality. 2. Mild to moderate chronic microvascular ischemic change. 3. Near-total opacification of the left maxillary sinus.  I personally reviewed brain Images and previous EEG reports.   ASSESSMENT AND PLAN  63 y.o. year old female  with history of hypertension, hyperlipidemia, obesity, joint pain who is presenting after a seizure on May 5 described a generalized convulsion with tongue biting.  Per history she did have her first seizure in September 2022.  She was discharged home on levetiracetam  500 mg twice daily but she had not been taking the medication.  Plan will be to restart patient on levetiracetam  but will start at 250 twice daily and I will also obtain a 3-day ambulatory EEG.  If any abnormality on the EEG, we will likely increase the levetiracetam  to 500 mg twice daily.  Advised her to contact me if she does have any side effect from the medication or any breakthrough seizure.  I also advised her to set up care with a primary care doctor to discuss her other medical conditions and her current medications.  She voiced understanding.  I will see her in 6 months for follow-up or sooner if worse.   1. Seizures (HCC)     Patient Instructions  Start with Levetiracetam  250 mg twice daily  Continue your other medications   3 day ambulatory EEG, I will contact you to go over the results Please set up care with a PCP  Return in 6 months or sooner if worse    Per [place]  DMV statutes, patients with seizures are not allowed to drive until they have been seizure-free for six months.  Other recommendations include using caution when using heavy equipment or power tools. Avoid working on ladders or at heights. Take showers instead of baths.  Do not swim alone.  Ensure the water temperature is not too high on the home water heater. Do not go swimming alone. Do not lock yourself in a room alone (i.e. bathroom). When caring for infants or small children, sit down when holding, feeding, or changing them to minimize risk of injury to the child in the event you have a seizure. Maintain good sleep hygiene. Avoid alcohol.  Also recommend adequate sleep, hydration, good diet and minimize stress.   During the Seizure  - First, ensure adequate ventilation and place patients on the floor on their left side  Loosen clothing around the neck and ensure the airway is patent. If the patient is clenching the teeth, do not force the mouth open with any object  as this can cause severe damage - Remove all items from the surrounding that can be hazardous. The patient may be oblivious to what's happening and may not even know what he or she is doing. If the patient is confused and wandering, either gently guide him/her away and block access to outside areas - Reassure the individual and be comforting - Call 911. In most cases, the seizure ends before EMS arrives. However, there are cases when seizures may last over 3 to 5 minutes. Or the individual may have developed breathing difficulties or severe injuries. If a pregnant patient or a person with diabetes develops a seizure, it is prudent to call an ambulance. - Finally, if the patient does not regain full consciousness, then call EMS. Most patients will remain confused for about 45 to 90  minutes after a seizure, so you must use judgment in calling for help. - Avoid restraints but make sure the patient is in a bed with padded side rails - Place the individual in a lateral position with the neck slightly flexed; this will help the saliva drain from the mouth and prevent the tongue from falling backward - Remove all nearby furniture and other hazards from the area - Provide verbal assurance as the individual is regaining consciousness - Provide the patient with privacy if possible - Call for help and start treatment as ordered by the caregiver   After the Seizure (Postictal Stage)  After a seizure, most patients experience confusion, fatigue, muscle pain and/or a headache. Thus, one should permit the individual to sleep. For the next few days, reassurance is essential. Being calm and helping reorient the person is also of importance.  Most seizures are painless and end spontaneously. Seizures are not harmful to others but can lead to complications such as stress on the lungs, brain and the heart. Individuals with prior lung problems may develop labored breathing and respiratory distress.    Discussed Patients with epilepsy have a small risk of sudden unexpected death, a condition referred to as sudden unexpected death in epilepsy (SUDEP). SUDEP is defined specifically as the sudden, unexpected, witnessed or unwitnessed, nontraumatic and nondrowning death in patients with epilepsy with or without evidence for a seizure, and excluding documented status epilepticus, in which post mortem examination does not reveal a structural or toxicologic cause for death     Orders Placed This Encounter  Procedures   AMBULATORY EEG    Meds ordered this encounter  Medications   levETIRAcetam  (KEPPRA ) 250 MG tablet    Sig: Take 1 tablet (250 mg total) by mouth 2 (two) times daily.    Dispense:  180 tablet    Refill:  3    Return in about 6 months (around 06/01/2024).  The patient's  condition of seizure requires frequent monitoring and adjustments in the treatment plan, reflecting the ongoing complexity of care.  This provider is the continuing focal point for all needed services for this condition.   Cassandra Cleveland, MD 12/01/2023, 2:16 PM  [facility] [address], [address] [place], [place] [address] [phone]

## 2023-12-01 NOTE — Patient Instructions (Addendum)
 Start with Levetiracetam  250 mg twice daily  Continue your other medications  3 day ambulatory EEG, I will contact you to go over the results Please set up care with a PCP  Return in 6 months or sooner if worse

## 2023-12-05 ENCOUNTER — Telehealth: Payer: Self-pay

## 2023-12-05 NOTE — Telephone Encounter (Signed)
 Valerie Carter

## 2023-12-09 ENCOUNTER — Inpatient Hospital Stay (HOSPITAL_BASED_OUTPATIENT_CLINIC_OR_DEPARTMENT_OTHER): Admitting: Hematology and Oncology

## 2023-12-09 ENCOUNTER — Encounter: Payer: Self-pay | Admitting: Hematology and Oncology

## 2023-12-09 ENCOUNTER — Inpatient Hospital Stay: Attending: Hematology and Oncology

## 2023-12-09 VITALS — BP 117/63 | HR 75 | Temp 97.6°F | Resp 18 | Ht 65.0 in

## 2023-12-09 DIAGNOSIS — D500 Iron deficiency anemia secondary to blood loss (chronic): Secondary | ICD-10-CM | POA: Diagnosis not present

## 2023-12-09 DIAGNOSIS — K912 Postsurgical malabsorption, not elsewhere classified: Secondary | ICD-10-CM | POA: Insufficient documentation

## 2023-12-09 DIAGNOSIS — Z9884 Bariatric surgery status: Secondary | ICD-10-CM | POA: Insufficient documentation

## 2023-12-09 LAB — CBC WITH DIFFERENTIAL/PLATELET
Abs Immature Granulocytes: 0 10*3/uL (ref 0.00–0.07)
Basophils Absolute: 0.1 10*3/uL (ref 0.0–0.1)
Basophils Relative: 1 %
Eosinophils Absolute: 0.3 10*3/uL (ref 0.0–0.5)
Eosinophils Relative: 5 %
HCT: 36.8 % (ref 36.0–46.0)
Hemoglobin: 11.4 g/dL — ABNORMAL LOW (ref 12.0–15.0)
Immature Granulocytes: 0 %
Lymphocytes Relative: 36 %
Lymphs Abs: 2.1 10*3/uL (ref 0.7–4.0)
MCH: 24.4 pg — ABNORMAL LOW (ref 26.0–34.0)
MCHC: 31 g/dL (ref 30.0–36.0)
MCV: 78.6 fL — ABNORMAL LOW (ref 80.0–100.0)
Monocytes Absolute: 0.7 10*3/uL (ref 0.1–1.0)
Monocytes Relative: 12 %
Neutro Abs: 2.6 10*3/uL (ref 1.7–7.7)
Neutrophils Relative %: 46 %
Platelets: 234 10*3/uL (ref 150–400)
RBC: 4.68 MIL/uL (ref 3.87–5.11)
RDW: 16.3 % — ABNORMAL HIGH (ref 11.5–15.5)
WBC: 5.7 10*3/uL (ref 4.0–10.5)
nRBC: 0 % (ref 0.0–0.2)

## 2023-12-09 LAB — FERRITIN: Ferritin: 26 ng/mL (ref 11–307)

## 2023-12-09 NOTE — Assessment & Plan Note (Addendum)
 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. in the future

## 2023-12-09 NOTE — Progress Notes (Signed)
   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. in the future    No orders of the defined types were placed in this encounter.   INTERVAL HISTORY: Patient returns for recurrent anemia Symptoms of anemia includes fatigue and pallor We reviewed CBC results and discussed role of further intravenous iron  infusion She was recently hospitalized due to seizure Unfortunately, the patient has lost her job because of that The patient denies any recent signs or symptoms of bleeding such as spontaneous epistaxis, hematuria or hematochezia.  SUMMARY OF HEMATOLOGIC HISTORY:  See my detailed dictation from March 17, 2023 for further details.  The patient was originally seen in the hospital as a consult due to severe anemia  She was found to have abnormal CBC from recent blood work The patient have history of laparoscopic gastric sleeve resection in 2018. The patient was advised to take chronic vitamin supplementation but she stopped taking them approximately 3 years ago.  She had regained a lot of weight and then was placed on [name]  for the last 2 years and she has lost 50 pounds. Most recently, she noted to have progressive symptoms of anemia with shortness of breath on minimal exertion, pre-syncopal episodes, and palpitations. She had not noticed any recent bleeding such as hematuria or  hematochezia.  She had spontaneous epistaxis 2 weeks ago but that has resolved.  She denies abnormal postmenopausal bleeding She is not on antiplatelets agents.  She takes ibuprofen  on a regular basis for chronic joint pain and back pain. She has not have colonoscopy done.  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 She never donated blood or received blood transfusion Due to religious reason, she has declined transfusion.  Her blood count has dropped since admission to 5.6 with low MCV.  Iron  studies confirm severe iron  deficiency anemia.  Serum vitamin B12 and folate were normal.  She does not have chronic kidney disease.  Family is wondering whether she would qualify for erythropoietin  stimulating agents She received 4 doses of intravenous iron  in 2024   Vitals:   12/09/23 1136  BP: 117/63  Pulse: 75  Resp: 18  Temp: 97.6 F (36.4 C)  SpO2: 99%

## 2023-12-12 ENCOUNTER — Other Ambulatory Visit: Payer: Self-pay | Admitting: Hematology and Oncology

## 2023-12-12 ENCOUNTER — Telehealth: Payer: Self-pay | Admitting: Hematology and Oncology

## 2023-12-12 ENCOUNTER — Telehealth: Payer: Self-pay

## 2023-12-12 DIAGNOSIS — I152 Hypertension secondary to endocrine disorders: Secondary | ICD-10-CM | POA: Diagnosis not present

## 2023-12-12 DIAGNOSIS — E1169 Type 2 diabetes mellitus with other specified complication: Secondary | ICD-10-CM | POA: Diagnosis not present

## 2023-12-12 DIAGNOSIS — K5903 Drug induced constipation: Secondary | ICD-10-CM | POA: Diagnosis not present

## 2023-12-12 NOTE — Telephone Encounter (Signed)
 Scheduled appointments per 6/13 los. Talked with the patient and she is aware of the made appointments.

## 2023-12-12 NOTE — Telephone Encounter (Signed)
 Called and given below message. She verbalized understanding.

## 2023-12-12 NOTE — Telephone Encounter (Signed)
-----   Message from Almeda Jacobs sent at 12/12/2023  8:36 AM EDT ----- Please call her, iron  studies are low, [address] will; call her to set up appt I will schedule follow-up in 5 mo

## 2023-12-12 NOTE — Telephone Encounter (Signed)
 Dr. Marton Sleeper, patient will be scheduled as soon as possible.  Auth Submission: NO AUTH NEEDED Site of care: Site of care: [facility] Payer: BCBS commercial and [employer] [place] medicaid Medication & CPT/J Code(s) submitted: Feraheme  (ferumoxytol ) [id number] Diagnosis Code:  Route of submission (phone, fax, portal):  Phone # Fax # Auth type: Buy/Bill PB Units/visits requested: 510mg  x 2 doses Reference number:  Approval from: 12/12/23 to 04/12/24

## 2023-12-16 DIAGNOSIS — R410 Disorientation, unspecified: Secondary | ICD-10-CM | POA: Diagnosis not present

## 2023-12-16 DIAGNOSIS — R569 Unspecified convulsions: Secondary | ICD-10-CM | POA: Diagnosis not present

## 2023-12-17 DIAGNOSIS — R569 Unspecified convulsions: Secondary | ICD-10-CM | POA: Diagnosis not present

## 2023-12-17 DIAGNOSIS — R410 Disorientation, unspecified: Secondary | ICD-10-CM | POA: Diagnosis not present

## 2023-12-18 DIAGNOSIS — R569 Unspecified convulsions: Secondary | ICD-10-CM | POA: Diagnosis not present

## 2023-12-18 DIAGNOSIS — R410 Disorientation, unspecified: Secondary | ICD-10-CM | POA: Diagnosis not present

## 2023-12-19 DIAGNOSIS — R569 Unspecified convulsions: Secondary | ICD-10-CM

## 2023-12-19 DIAGNOSIS — R410 Disorientation, unspecified: Secondary | ICD-10-CM | POA: Diagnosis not present

## 2023-12-21 ENCOUNTER — Ambulatory Visit

## 2023-12-21 VITALS — BP 133/78 | HR 93 | Temp 97.9°F | Resp 20 | Ht 65.0 in | Wt 318.6 lb

## 2023-12-21 DIAGNOSIS — D500 Iron deficiency anemia secondary to blood loss (chronic): Secondary | ICD-10-CM

## 2023-12-21 DIAGNOSIS — Z9884 Bariatric surgery status: Secondary | ICD-10-CM | POA: Diagnosis not present

## 2023-12-21 DIAGNOSIS — D508 Other iron deficiency anemias: Secondary | ICD-10-CM | POA: Diagnosis not present

## 2023-12-21 MED ORDER — SODIUM CHLORIDE 0.9 % IV SOLN
510.0000 mg | Freq: Once | INTRAVENOUS | Status: AC
  Administered 2023-12-21: 510 mg via INTRAVENOUS
  Filled 2023-12-21: qty 17

## 2023-12-21 MED ORDER — ACETAMINOPHEN 325 MG PO TABS: 650.0000 mg | ORAL_TABLET | Freq: Once | ORAL | Status: DC

## 2023-12-21 NOTE — Progress Notes (Signed)
 Diagnosis: Iron  Deficiency Anemia  Provider:  Praveen Mannam MD  Procedure: IV Infusion  IV Type: Peripheral, IV Location: R Forearm  Feraheme  (Ferumoxytol ), Dose: 510 mg  Infusion Start Time: 1454  Infusion Stop Time: 1513  Post Infusion IV Care: Observation period completed and Peripheral IV Discontinued  Discharge: Condition: Good, Destination: Home . AVS Declined  Performed by:  Oluwatosin Higginson, RN

## 2023-12-27 DIAGNOSIS — R5381 Other malaise: Secondary | ICD-10-CM | POA: Diagnosis not present

## 2023-12-27 DIAGNOSIS — R569 Unspecified convulsions: Secondary | ICD-10-CM | POA: Diagnosis not present

## 2023-12-28 ENCOUNTER — Ambulatory Visit

## 2023-12-28 VITALS — BP 144/84 | HR 78 | Temp 97.6°F | Resp 20 | Ht 65.0 in | Wt 319.8 lb

## 2023-12-28 DIAGNOSIS — D508 Other iron deficiency anemias: Secondary | ICD-10-CM | POA: Diagnosis not present

## 2023-12-28 DIAGNOSIS — D500 Iron deficiency anemia secondary to blood loss (chronic): Secondary | ICD-10-CM

## 2023-12-28 DIAGNOSIS — Z9884 Bariatric surgery status: Secondary | ICD-10-CM

## 2023-12-28 MED ORDER — ACETAMINOPHEN 325 MG PO TABS: 650.0000 mg | ORAL_TABLET | Freq: Once | ORAL | Status: DC

## 2023-12-28 MED ORDER — SODIUM CHLORIDE 0.9 % IV SOLN
510.0000 mg | Freq: Once | INTRAVENOUS | Status: AC
  Administered 2023-12-28: 510 mg via INTRAVENOUS
  Filled 2023-12-28: qty 17

## 2023-12-28 NOTE — Progress Notes (Signed)
 Diagnosis: Iron  Deficiency Anemia  Provider:  Praveen Mannam MD  Procedure: IV Infusion  IV Type: Peripheral, IV Location: R Forearm  Feraheme  (Ferumoxytol ), Dose: 510 mg  Infusion Start Time: 1502  Infusion Stop Time: 1517  Post Infusion IV Care: Observation period completed  Discharge: Condition: Good, Destination: Home . AVS Declined  Performed by:  Eleanor DELENA Bloch, RN

## 2024-01-02 ENCOUNTER — Ambulatory Visit: Payer: Self-pay | Admitting: Neurology

## 2024-01-02 ENCOUNTER — Encounter (INDEPENDENT_AMBULATORY_CARE_PROVIDER_SITE_OTHER): Payer: Self-pay | Admitting: Neurology

## 2024-01-02 DIAGNOSIS — R569 Unspecified convulsions: Secondary | ICD-10-CM

## 2024-01-02 NOTE — Procedures (Signed)
 Clinical History:  This is a 63 y/o F who presents with a seizure that was witnessed by a Radio broadcast assistant.   INTERMITTENT MONITORING with VIDEO TECHNICAL SUMMARY:  This AVEEG was performed using equipment provided by [employer] utilizing Bluetooth ( Trackit ) amplifiers with continuous EEGT attended video collection using encrypted remote transmission via [employer] secured cellular tower network with data rates for each AVEEG performed. This is a Therapist, music AVEEG, obtained, according to the 10-20 international electrode placement system, reformatted digitally into referential and bipolar montages. Data was acquired with a minimum of 21 bipolar connections and sampled at a minimum rate of 250 cycles per second per channel, maximum rate of 450 cycles per second per channel and two channels for EKG. The entire VEEG study was recorded through cable and or radio telemetry for subsequent analysis. Specified epochs of the AVEEG data were identified at the direction of the subject by the depression of a push button by the patient. Each patients event file included data acquired two minutes prior to the push button activation and continuing until two minutes afterwards. AVEEG files were reviewed on [facility] server, Licensed Software provided by [employer] with a digital high frequency filter set at 70 Hz and a low frequency filter set at 1 Hz with a paper speed of 34mm/s resulting in 10 seconds per digital page. This entire AVEEG was reviewed by the EEG Technologist. Random time samples, random sleep samples, clips, patient initiated push button files with included patient daily diary logs, EEG Technologist pruned data was reviewed and verified for accuracy and validity by the governing reading neurologist in full details. This AEEGV was fully compliant with all requirements for CPT 97500 for setup, patient education, take down and administered by an EEG technologist.   Long-Term EEG with  Video was monitored intermittently by a qualified EEG technologist for the entirety of the recording; quality check-ins were performed at a minimum of every two hours, checking and documenting real-time data and video to assure the integrity and quality of the recording (e.g., camera position, electrode integrity and impedance), and identify the need for maintenance. For intermittent monitoring, an EEG Technologist monitored no more than 12 patients concurrently. Diagnostic video was captured at least 80% of the time during the recording.   PATIENT EVENTS: There were no patient events noted or captured during this recording.   TECHNOLOGIST EVENTS:  No clear epileptiform activity was detected by the reviewing neurodiagnostic technologist for further review.   TIME SAMPLES:  10-minutes of every 2 hours recorded are reviewed as random time samples.   SLEEP SAMPLES:  5-minutes of every 24 hours recorded are reviewed as random sleep samples.   AWAKE:  At maximal level of alertness, the posterior dominant background activity was continuous, reactive, low voltage rhythm of 9 Hz. This was symmetric, well-modulated, and attenuated with eye opening. Diffuse, symmetric, frontocentral beta range activity was present.   SLEEP:  N1 Sleep (Stage 1) was observed and characterized by the disappearance of alpha rhythm and the appearance of vertex activity  N2 Sleep (Stage 2) was observed and characterized by vertex waves, K-complexes, and sleep spindles.   N3 (Stage 3) sleep was observed and characterized by high amplitude Delta activity of 20%.   REM sleep was observed.   EKG: There were no arrhythmias or abnormalities noted during this recording.   Impression:  Normal EEG: awake and asleep Clinical Correlation: This is a normal 3-day ambulatory EEG tracing. No focal abnormalities or epileptiform discharges  were seen. There were no electrographic seizures noted. No events were captured during the  recording. Please note a normal EEG does not exclude the diagnosis of epilepsy   Pastor Falling, MD [facility]

## 2024-01-13 ENCOUNTER — Encounter (HOSPITAL_COMMUNITY): Payer: Self-pay | Admitting: *Deleted

## 2024-01-17 DIAGNOSIS — E1169 Type 2 diabetes mellitus with other specified complication: Secondary | ICD-10-CM | POA: Diagnosis not present

## 2024-01-17 DIAGNOSIS — R609 Edema, unspecified: Secondary | ICD-10-CM | POA: Diagnosis not present

## 2024-01-17 DIAGNOSIS — K5900 Constipation, unspecified: Secondary | ICD-10-CM | POA: Diagnosis not present

## 2024-01-17 DIAGNOSIS — R569 Unspecified convulsions: Secondary | ICD-10-CM | POA: Diagnosis not present

## 2024-01-20 DIAGNOSIS — M170 Bilateral primary osteoarthritis of knee: Secondary | ICD-10-CM | POA: Diagnosis not present

## 2024-01-20 DIAGNOSIS — R52 Pain, unspecified: Secondary | ICD-10-CM | POA: Diagnosis not present

## 2024-02-03 DIAGNOSIS — E568 Deficiency of other vitamins: Secondary | ICD-10-CM | POA: Diagnosis not present

## 2024-02-03 DIAGNOSIS — Z1321 Encounter for screening for nutritional disorder: Secondary | ICD-10-CM | POA: Diagnosis not present

## 2024-02-04 DIAGNOSIS — R569 Unspecified convulsions: Secondary | ICD-10-CM | POA: Diagnosis not present

## 2024-02-04 DIAGNOSIS — R5381 Other malaise: Secondary | ICD-10-CM | POA: Diagnosis not present

## 2024-02-17 ENCOUNTER — Ambulatory Visit: Admitting: Dietician

## 2024-02-17 ENCOUNTER — Encounter: Payer: Self-pay | Admitting: Family Medicine

## 2024-02-17 ENCOUNTER — Ambulatory Visit: Admitting: Family Medicine

## 2024-02-17 VITALS — BP 138/84 | Ht 65.0 in | Wt 319.0 lb

## 2024-02-17 DIAGNOSIS — M25511 Pain in right shoulder: Secondary | ICD-10-CM | POA: Diagnosis not present

## 2024-02-17 DIAGNOSIS — M25512 Pain in left shoulder: Secondary | ICD-10-CM | POA: Diagnosis not present

## 2024-02-17 MED ORDER — METHYLPREDNISOLONE ACETATE 40 MG/ML IJ SUSP
40.0000 mg | Freq: Once | INTRAMUSCULAR | Status: AC
  Administered 2024-02-17: 40 mg via INTRA_ARTICULAR

## 2024-02-17 MED ORDER — HYDROCODONE-ACETAMINOPHEN 5-325 MG PO TABS
2.0000 | ORAL_TABLET | Freq: Two times a day (BID) | ORAL | 0 refills | Status: DC | PRN
Start: 2024-02-17 — End: 2024-04-20

## 2024-02-17 NOTE — Progress Notes (Signed)
  Valerie Carter - 63 y.o. female MRN [id number]  Date of birth: 08-14-1960    SUBJECTIVE:      Chief Complaint:/ HPI:    Bilateral shoulder and knee pain.  Would like to get injections if possible.  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  Having a lot of knee and shoulder pain.  Since she is not working right now, she would like to have some medication for pain relief .  Wants to consider injection therapy today if at all possible.   OBJECTIVE: BP 138/84 (BP Location: Left Arm, Patient Position: Sitting)   Ht 5' 5 (1.651 m)   Wt (!) 319 lb (144.7 kg)   BMI 53.08 kg/m   Physical Exam:  Vital signs are reviewed. Bilateral shoulders painful arc of motion above forward flexion 90 degrees, lateral abduction 90 degrees.  Internal rotation limited bilaterally being able to place hand to the posterior hip only.  Distally neuro vastly intact. PROCEDURE: INJECTION: Patient was given informed consent, signed copy in the chart. Appropriate time out was taken. Area prepped and draped in usual sterile fashion. Ethyl chloride was  used for local anesthesia. A 21 gauge 1 1/2 inch needle was used..  1 cc of methylprednisolone  40 mg/ml plus 4 cc of 1% lidocaine  without epinephrine  was injected into the bilateral subacromial bursa of each shoulder using a(n) posterior approach.   The patient tolerated the procedure well. There were no complications. Post procedure instructions were given.   ASSESSMENT & PLAN: Chronic pain: Will give her CSI bilateral shoulders today.  Did not think she should get for large joint injections today.   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  We discussed possibly transitioning to extended release type medication at next ov.   - Ultimate goal is to lose weight enough to get B TKR,  reinstate exercise, lose more weight which will help chronic pain and ability to exercise as additional treatment for chronic pain,.  2. Obesity & B severe DJD knees: She is very motivated to lose some weight as she has found a orthopedic surgeon who will do bilateral knee replacement on her if she gets down to 270, Dr. Waylon in [place].  This has remotivated her to lose weight.  I also discussed with her genicular nerve blocks.  We talked about this for quite a while and I have set her up for further evaluation here on September 5.  If it is worse for her, could consider neurolysis.  This would potentially give her some relief in the time between now and when she is eligible for total knee replacement.  3.  Regarding seizure that started her loss of her job: She is seeing neurology and they have done MRI which was negative and EEG which was negative reportedly.    4. SDH: She is losing her PCP who has left that office and looking for a new one.  I will transition her to my continuity practice at [facility] and they will contact her for appointment in the upcoming weeks. See problem based charting & AVS for pt instructions. No problem-specific Assessment & Plan notes found for this encounter.

## 2024-03-02 ENCOUNTER — Other Ambulatory Visit: Payer: Self-pay

## 2024-03-02 ENCOUNTER — Ambulatory Visit: Admitting: Pain Medicine

## 2024-03-02 VITALS — BP 145/69 | Ht 64.5 in | Wt 315.0 lb

## 2024-03-02 DIAGNOSIS — M25511 Pain in right shoulder: Secondary | ICD-10-CM | POA: Diagnosis not present

## 2024-03-02 DIAGNOSIS — M170 Bilateral primary osteoarthritis of knee: Secondary | ICD-10-CM

## 2024-03-02 DIAGNOSIS — M25512 Pain in left shoulder: Secondary | ICD-10-CM | POA: Diagnosis not present

## 2024-03-02 DIAGNOSIS — G8929 Other chronic pain: Secondary | ICD-10-CM

## 2024-03-02 NOTE — Progress Notes (Signed)
 Patient here today for bilateral genicular nerve blocks.  Referred by Dr. Rosalynn.  R/B/A explained to patient and she wishes to proceed.  A1c is well controlled per patient and no blood thinners.    PRE-OP DIAGNOSIS: bilateral knee osteoarthritis PROCEDURE: bilatearl knee genicular nerve blocks under US  guidance Performing Physician: Redell Reid, MD   Total Dose:       bupivacaine  0.5%      4.5  mL     R/B/A of procedure provided.  Risks of infection, bleeding, damage to other structures and nerves explained.    Procedure: The area was prepped in the usual sterile manner with chlorhexidine . Sterile ultrasound gel was applied.  Ultrasound was used to identify the femoral shaft and condyles, along with the tibia shaft and head on the side noted above.  The inflection points were noted at the areas of the superior medial, superior lateral, and inferomedial genicular nerves.  A 25g 3.5 inch Quinky spinal needle was inserted through the skin and to the area of the nerves.  After negative aspiration, 0.75 ml of the above solution was injected at each of the sites above.   There were no complications during this procedure.   Patient reported relief upon standing after the procedure.  Strength intact   Followup: The patient tolerated the procedure well without complications.  Standard post-procedure care is explained and return precautions are given.

## 2024-03-21 ENCOUNTER — Ambulatory Visit: Admitting: Skilled Nursing Facility1

## 2024-04-04 ENCOUNTER — Ambulatory Visit: Admitting: Skilled Nursing Facility1

## 2024-04-20 ENCOUNTER — Telehealth: Payer: Self-pay

## 2024-04-20 ENCOUNTER — Other Ambulatory Visit (HOSPITAL_COMMUNITY): Payer: Self-pay

## 2024-04-20 ENCOUNTER — Ambulatory Visit (HOSPITAL_COMMUNITY)
Admission: RE | Admit: 2024-04-20 | Discharge: 2024-04-20 | Disposition: A | Discharge status: Home / Self Care | Source: Ambulatory Visit | Attending: Vascular Surgery | Admitting: Vascular Surgery

## 2024-04-20 ENCOUNTER — Other Ambulatory Visit: Payer: Self-pay | Admitting: Family Medicine

## 2024-04-20 ENCOUNTER — Ambulatory Visit (INDEPENDENT_AMBULATORY_CARE_PROVIDER_SITE_OTHER): Payer: Self-pay | Admitting: Pain Medicine

## 2024-04-20 VITALS — BP 157/84 | Ht 64.5 in | Wt 321.0 lb

## 2024-04-20 DIAGNOSIS — M79661 Pain in right lower leg: Secondary | ICD-10-CM | POA: Diagnosis present

## 2024-04-20 DIAGNOSIS — M7989 Other specified soft tissue disorders: Secondary | ICD-10-CM

## 2024-04-20 MED ORDER — HYDROCODONE-ACETAMINOPHEN 5-325 MG PO TABS: 2.0000 | ORAL_TABLET | Freq: Two times a day (BID) | ORAL | 0 refills | Status: DC | PRN

## 2024-04-20 NOTE — Progress Notes (Signed)
 DATE OF VISIT: 04/20/2024        Valerie Carter DOB: 11/02/1960 MRN: [id number]  CC:  bilateral knee pain  History- Valerie Carter is a 63 y.o. female in clinic for evaluation and treatment of bilateral knee pain. Today, patient states that bilateral genicular nerve blocks did not help her pain and may have worsened it.  No relief from injection.    Current complaint is primarily a swollen right leg x 3 weeks.  States that she has been unable to fit into her shoe lately.  Improves at night with elevation slightly, but quickly worsens in morning.  Does not report any pain.  No history of blood clots, anti-coagulation, or estrogen containing medications.    Past Medical History 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   Diabetes mellitus without complication (HCC)    type 2   DJD (degenerative joint disease)    both knees right worse than left   Headache    Hyperlipidemia    Hypertension    Intramural leiomyoma of uterus    Joint pain    Knee pain    Lactose intolerance    Lower extremity edema    Morbid obesity with BMI of 50.0-59.9, adult (HCC)    Osteoarthritis    Vitamin D  deficiency     Past Surgical History Past Surgical History:  Procedure Laterality Date   BREAST BIOPSY Left 01/04/2023   US  LT BREAST BX W LOC DEV 1ST LESION IMG BX SPEC US  GUIDE 01/04/2023 [facility]   KNEE SURGERY Right 1999   torn cartlidge repair   LAPAROSCOPIC GASTRIC SLEEVE RESECTION N/A 06/07/2017   Procedure: LAPAROSCOPIC GASTRIC SLEEVE RESECTION, UPPER ENDOSCOPY;  Surgeon: Stevie Herlene Righter, MD;  Location: [facility];  Service: General;  Laterality: N/A;    Medications Current Outpatient Medications  Medication Sig Dispense Refill   acetaminophen  (ACETAMINOPHEN  8 HOUR) 650 MG CR tablet Take 1,300 mg by mouth every 12 (twelve) hours.     cyclobenzaprine  (FLEXERIL ) 5 MG tablet Take 1 tablet  (5 mg total) by mouth 3 (three) times daily. 30 tablet 0   Diclofenac  Sodium 3 % GEL Apply 1 Application topically 2 (two) times daily. Apply to painful areas 100 g 2   HYDROcodone -acetaminophen  (NORCO/VICODIN) 5-325 MG tablet Take 2 tablets by mouth every 12 (twelve) hours as needed for moderate pain (pain score 4-6). 120 tablet 0   levETIRAcetam  (KEPPRA ) 250 MG tablet Take 1 tablet (250 mg total) by mouth 2 (two) times daily. 180 tablet 3   Menthol, Topical Analgesic, (BIOFREEZE ROLL-ON) 4 % GEL Apply 1 application  topically daily.     metoprolol  tartrate (LOPRESSOR ) 50 MG tablet Take 1 tablet (50 mg total) by mouth 2 (two) times daily. 60 tablet 2   Multiple Vitamin (MULTIVITAMIN WITH MINERALS) TABS tablet Take 1 tablet by mouth daily.     senna (SENOKOT) 8.6 MG TABS tablet Take 1 tablet (8.6 mg total) by mouth 2 (two) times daily. (Patient taking differently: Take 1 tablet by mouth daily.) 60 tablet 1   thiamine  (VITAMIN B-1) 100 MG tablet Take 1 tablet (100 mg total) by mouth daily. 30 tablet 1   No current facility-administered medications for this visit.    Allergies has no known allergies.  Family History - reviewed per EMR and intake form  Social History  reports current alcohol use of about 14.0 standard drinks of alcohol per week.  reports that she has never smoked. She has never used smokeless tobacco.  reports no history of drug use.  EXAM: Vitals: BP (!) 157/84   Ht 5' 4.5 (1.638 m)   Wt (!) 321 lb (145.6 kg)   BMI 54.25 kg/m  General: AOx3, NAD, pleasant SKIN: no rashes or lesions, skin clean, dry, intact MSK: R>L leg swelling in ankle and foot with 2+ pitting edema.  No pain to touch.  Negative Homan's sign.  NEURO: sensation intact to light touch   ASSESSMENT:   ICD-10-CM   1. Pain and swelling of right lower leg  M79.661 VAS US  LOWER EXTREMITY VENOUS (DVT)   M79.89        PLAN: 1. Will order RLE doppler to evaluate for DVT on right side.  Explained to  patient the need for exam and symptoms of PE--if occurs, explained need to seek emergent medical care. 2. If positive, will need to start DOAC.  Discussed care with Dr. Rosalynn. 3. Given lack of response to genicular blocks, will not proceed with additional injections.  Will refer back to Dr. Rosalynn for management.  Updated for 04/21/24 - Results negative for DVT on study.  Attempted to call patient but sent to voicemail.  Will send MyChart message as alternative.

## 2024-04-20 NOTE — Telephone Encounter (Signed)
 Pharmacy Patient Advocate Encounter   Received notification from CoverMyMeds that prior authorization for HYDROcodone -Acetaminophen  5-325MG  tablets is required/requested.   Insurance verification completed.   The patient is insured through [facility] MEDICAID. (MAX 5 DAY SUPPLY)   Dr. Rosalynn, what is the dx for this medication? And which appt chart notes would need to be used with this?

## 2024-04-21 ENCOUNTER — Ambulatory Visit: Payer: Self-pay | Admitting: Pain Medicine

## 2024-05-07 ENCOUNTER — Other Ambulatory Visit (HOSPITAL_COMMUNITY): Payer: Self-pay

## 2024-05-07 NOTE — Telephone Encounter (Signed)
 Test claim now shows patient now has another primary coverage. Unable to verify via benefit verification.

## 2024-05-11 ENCOUNTER — Telehealth: Payer: Self-pay

## 2024-05-11 ENCOUNTER — Encounter: Payer: Self-pay | Admitting: Hematology and Oncology

## 2024-05-11 ENCOUNTER — Inpatient Hospital Stay: Admitting: Hematology and Oncology

## 2024-05-11 ENCOUNTER — Inpatient Hospital Stay: Attending: Hematology and Oncology

## 2024-05-11 VITALS — BP 115/73 | HR 71 | Temp 97.7°F | Resp 18 | Ht 64.5 in | Wt 329.0 lb

## 2024-05-11 DIAGNOSIS — D500 Iron deficiency anemia secondary to blood loss (chronic): Secondary | ICD-10-CM

## 2024-05-11 DIAGNOSIS — K909 Intestinal malabsorption, unspecified: Secondary | ICD-10-CM | POA: Insufficient documentation

## 2024-05-11 DIAGNOSIS — Z9884 Bariatric surgery status: Secondary | ICD-10-CM | POA: Insufficient documentation

## 2024-05-11 LAB — CBC WITH DIFFERENTIAL/PLATELET
Abs Immature Granulocytes: 0.02 K/uL (ref 0.00–0.07)
Basophils Absolute: 0 K/uL (ref 0.0–0.1)
Basophils Relative: 1 %
Eosinophils Absolute: 0.3 K/uL (ref 0.0–0.5)
Eosinophils Relative: 4 %
HCT: 38.9 % (ref 36.0–46.0)
Hemoglobin: 12.2 g/dL (ref 12.0–15.0)
Immature Granulocytes: 0 %
Lymphocytes Relative: 30 %
Lymphs Abs: 1.9 K/uL (ref 0.7–4.0)
MCH: 26 pg (ref 26.0–34.0)
MCHC: 31.4 g/dL (ref 30.0–36.0)
MCV: 82.8 fL (ref 80.0–100.0)
Monocytes Absolute: 0.8 K/uL (ref 0.1–1.0)
Monocytes Relative: 13 %
Neutro Abs: 3.2 K/uL (ref 1.7–7.7)
Neutrophils Relative %: 52 %
Platelets: 254 K/uL (ref 150–400)
RBC: 4.7 MIL/uL (ref 3.87–5.11)
RDW: 13.8 % (ref 11.5–15.5)
WBC: 6.3 K/uL (ref 4.0–10.5)
nRBC: 0 % (ref 0.0–0.2)

## 2024-05-11 LAB — FERRITIN: Ferritin: 30 ng/mL (ref 11–307)

## 2024-05-11 MED ORDER — HYDROCODONE-ACETAMINOPHEN 5-325 MG PO TABS: 2.0000 | ORAL_TABLET | Freq: Two times a day (BID) | ORAL | 0 refills | Status: DC | PRN

## 2024-05-11 NOTE — Progress Notes (Signed)
   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   INTERVAL HISTORY: Patient returns for recurrent anemia Symptoms of anemia includes none We reviewed CBC result today  SUMMARY OF HEMATOLOGIC HISTORY:  See my detailed dictation from March 17, 2023 for further details.  The patient was originally seen in the hospital as a consult due to severe anemia  She was found to have abnormal CBC from recent blood work The patient have history of laparoscopic gastric sleeve resection in 2018. The patient was advised to take chronic vitamin supplementation but she stopped taking them approximately 3 years ago.  She had regained a lot of weight and then was placed on [name]  for the last 2 years and she has lost 50 pounds. Most recently, she noted to have progressive symptoms of anemia with shortness of breath on minimal exertion, pre-syncopal episodes, and palpitations. She had not noticed any recent bleeding such as hematuria or hematochezia.  She had spontaneous epistaxis 2 weeks ago but that has resolved.  She denies abnormal postmenopausal bleeding She is not on antiplatelets agents.  She takes ibuprofen  on a regular basis for chronic joint pain and back pain. She has not have colonoscopy done.  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 She never donated blood or received blood transfusion Due to religious reason, she has declined transfusion.  Her blood count has dropped  since admission to 5.6 with low MCV.  Iron  studies confirm severe iron  deficiency anemia.  Serum vitamin B12 and folate were normal.  She does not have chronic kidney disease.  Family is wondering whether she would qualify for erythropoietin  stimulating agents She received 4 doses of intravenous iron  in 2024 She received 3 doses of intravenous iron  in 2025  Lab Results  Component Value Date   VITAMINB12 1,654 (H) 03/17/2023   FERRITIN 26 12/09/2023   HGB 12.2 05/11/2024   RBC 4.70 05/11/2024   Vitals:   05/11/24 1252  BP: 115/73  Pulse: 71  Resp: 18  Temp: 97.7 F (36.5 C)  SpO2: 100%

## 2024-05-11 NOTE — Telephone Encounter (Signed)
-----   Message from Alisa DEL sent at 05/11/2024  8:41 AM EST ----- Regarding: Pt request provider call in new Rx for Hydrocodone  Pt says [facility] on gv her 5dys supply of Rx due to some Ins issues.  Patient ask for provider to renew pain Rx:  HYDROcodone -acetaminophen  (NORCO/VICODIN) 5-325 MG tablet [[id number]]   Order Details Dose: 2 tablet Route: Oral Frequency: Every 12 hours PRN for moderate pain (pain score 4-6) Dispense Quantity: 120 tablet (30 day supply) Refills: 0  Duration: 30 days  Pt uses: [facility] on [address]  Pls & Thx

## 2024-05-11 NOTE — Assessment & Plan Note (Addendum)
 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

## 2024-05-14 NOTE — Telephone Encounter (Signed)
 Pharmacy Patient Advocate Encounter  Received notification from [facility] MEDICAID that Prior Authorization for Hydroco/Apap Tab 5-325mg  has been APPROVED from 05/14/24 to 11/12/23   PA #/Case ID/Reference #: [id number]

## 2024-05-14 NOTE — Telephone Encounter (Signed)
 Prior authorization submitted for HYDROcodone -Acetaminophen  5-325MG  tablets to [facility] MEDICAID via Latent.   Key: [id number]

## 2024-05-24 ENCOUNTER — Encounter: Payer: Self-pay | Admitting: Family Medicine

## 2024-05-28 ENCOUNTER — Ambulatory Visit: Admitting: Family Medicine

## 2024-05-28 ENCOUNTER — Encounter: Payer: Self-pay | Admitting: Family Medicine

## 2024-05-28 VITALS — BP 141/84 | HR 91 | Ht 64.5 in | Wt 342.2 lb

## 2024-05-28 DIAGNOSIS — E119 Type 2 diabetes mellitus without complications: Secondary | ICD-10-CM

## 2024-05-28 DIAGNOSIS — Z1211 Encounter for screening for malignant neoplasm of colon: Secondary | ICD-10-CM | POA: Diagnosis not present

## 2024-05-28 DIAGNOSIS — I10 Essential (primary) hypertension: Secondary | ICD-10-CM | POA: Diagnosis not present

## 2024-05-28 DIAGNOSIS — R600 Localized edema: Secondary | ICD-10-CM | POA: Diagnosis present

## 2024-05-28 DIAGNOSIS — R9389 Abnormal findings on diagnostic imaging of other specified body structures: Secondary | ICD-10-CM

## 2024-05-28 MED ORDER — LISINOPRIL 5 MG PO TABS: 5.0000 mg | ORAL_TABLET | Freq: Every day | ORAL | 3 refills | Status: AC

## 2024-05-28 NOTE — Progress Notes (Unsigned)
   SUBJECTIVE:   CHIEF COMPLAINT / HPI:  Discussed the use of AI scribe software for clinical note transcription with the patient, who gave verbal consent to proceed.  History of Present Illness Valerie Carter is a 63 year old female with hypertension who presents with swelling in her right foot.  Right foot edema - Swelling in the right foot has persisted for over three weeks - Edema is primarily located on the dorsum of the right foot - Soreness present upon pressure to the affected area - Swelling decreases with rest and recurs with standing and movement - No recent trauma to the area - Ultrasound in October ruled out deep vein thrombosis - Received bilateral knee injections, identified as nerve blocks, at a sports medicine clinic in October. Did not have any side effects from these  Has history of anemia treated with iron  transfusions   Hypertension and antihypertensive medication intolerance - Currently taking metoprolol  50 mg daily for hypertension - Requires a refill of metoprolol  - Previously used lisinopril  and carvedilol but experienced hypotension with these medications. She had required both metoprolol  and carvedilol for rate control and was discontinued from lisinopril  to make room for carvedilol. Was discontinued from carvedilol was rate was controlled.    Diabetes mellitus - Diabetes is well-controlled - Recent hemoglobin A1c is 5.6      PERTINENT  PMH / PSH: s/p sleeve gastrectomy, obesity, IDA  OBJECTIVE:  BP (!) 141/84   Pulse 91   Ht 5' 4.5 (1.638 m)   Wt (!) 342 lb 3.2 oz (155.2 kg)   SpO2 100%   BMI 57.83 kg/m   General: well appearing, in no acute distress CV: RRR, radial pulses equal and palpable Resp: Normal work of breathing on room air Abd: Soft, non tender, non distended  Neuro: Alert & Oriented x 4  EXT: right foot and lower leg swelling 2+ pitting edema up to mid shins, no skin changes, tender to deep palpation.   ASSESSMENT/PLAN:    Assessment & Plan Lower extremity edema Broad ddx including anemia, thyroid issues. However, concerning that edema is on one side. Patient had evidence of thickened endometrium on CTAP incidentally while inpatient and could not rule out endometrial cancer. Recommended pelvic mri as follow up. Cannot rule out that pt could have a mass causing pelvic outlet obstruction and unilateral swelling.  - CBC, TSH, BMP, BNP, Pelvic MRI  - Follow up in 2 weeks.  Thickened endometrium Endometrial thickening on CTAP incidentally recently this year. Previously had endometrial biopsy almost 10 years ago that was benign.  - Follow up pelvic MRI  HYPERTENSION, BENIGN ESSENTIAL Uncontrolled. Only on metoprolol  now.  - Restart lisinopril   - Follow up in 2 weeks for BP recheck and BMP  Screening for colon cancer Could not undergo colonoscopy due to BMI.  - Ordered cologuard.  Controlled type 2 diabetes mellitus without complication, without long-term current use of insulin  (HCC) Controlled diabetes previous A1c 5.6.  - Recheck A1c.     Areta Saliva, MD [facility]

## 2024-05-28 NOTE — Patient Instructions (Addendum)
 It was wonderful to see you today.  Please bring ALL of your medications with you to every visit.   Today we talked about:  Right leg swelling - We are doing more labs today to see if we can find a cause of your swelling. Additionally I have ordered a pelvic MRI. You had a thickened endometrium (lining of your uterus) on a scan earlier this year. If something was obstructing a blood vessel near your pelvis that could also cause leg swelling in one side.   I have also ordered a cologuard test to screen for colon cancer.   For your blood pressure it is elevated still. You were previously taken off of the lisinopril  to be able to be on two heart rate controlling medicines. Now that you are only on one your lisinopril  can be added back on. I have restarted this and we will recheck your kidney function at your follow up.   Please follow up in 2 weeks   Thank you for choosing [facility].   Please call [phone] with any questions about today's appointment.  Please be sure to schedule follow up at the front desk before you leave today.   Areta Saliva, MD  Family Medicine

## 2024-05-29 LAB — HEMOGLOBIN A1C
Est. average glucose Bld gHb Est-mCnc: 111 mg/dL
Hgb A1c MFr Bld: 5.5 % (ref 4.8–5.6)

## 2024-05-29 LAB — TSH RFX ON ABNORMAL TO FREE T4: TSH: 1.1 u[IU]/mL (ref 0.450–4.500)

## 2024-05-29 NOTE — Assessment & Plan Note (Signed)
 Uncontrolled. Only on metoprolol  now.  - Restart lisinopril   - Follow up in 2 weeks for BP recheck and BMP

## 2024-05-29 NOTE — Assessment & Plan Note (Signed)
 Endometrial thickening on CTAP incidentally recently this year. Previously had endometrial biopsy almost 10 years ago that was benign.  - Follow up pelvic MRI

## 2024-05-30 LAB — BASIC METABOLIC PANEL WITH GFR
BUN/Creatinine Ratio: 24 (ref 12–28)
BUN: 19 mg/dL (ref 8–27)
CO2: 20 mmol/L (ref 20–29)
Calcium: 9.3 mg/dL (ref 8.7–10.3)
Chloride: 106 mmol/L (ref 96–106)
Creatinine, Ser: 0.8 mg/dL (ref 0.57–1.00)
Glucose: 73 mg/dL (ref 70–99)
Potassium: 4.6 mmol/L (ref 3.5–5.2)
Sodium: 142 mmol/L (ref 134–144)
eGFR: 83 mL/min/1.73 (ref >59)

## 2024-05-30 LAB — LIPID PANEL
Chol/HDL Ratio: 2.6 ratio (ref 0.0–4.4)
Cholesterol, Total: 186 mg/dL (ref 100–199)
HDL: 71 mg/dL (ref >39)
LDL Chol Calc (NIH): 105 mg/dL — ABNORMAL HIGH (ref 0–99)
Triglycerides: 51 mg/dL (ref 0–149)
VLDL Cholesterol Cal: 10 mg/dL (ref 5–40)

## 2024-05-30 LAB — BRAIN NATRIURETIC PEPTIDE: BNP: 59.3 pg/mL (ref 0.0–100.0)

## 2024-05-31 ENCOUNTER — Ambulatory Visit: Payer: Self-pay | Admitting: Family Medicine

## 2024-05-31 DIAGNOSIS — R600 Localized edema: Secondary | ICD-10-CM

## 2024-05-31 DIAGNOSIS — R9389 Abnormal findings on diagnostic imaging of other specified body structures: Secondary | ICD-10-CM

## 2024-06-01 ENCOUNTER — Ambulatory Visit (HOSPITAL_COMMUNITY): Admission: RE | Admit: 2024-06-01 | Discharge: 2024-06-01 | Attending: Family Medicine | Admitting: Family Medicine

## 2024-06-01 DIAGNOSIS — R600 Localized edema: Secondary | ICD-10-CM

## 2024-06-01 DIAGNOSIS — R9389 Abnormal findings on diagnostic imaging of other specified body structures: Secondary | ICD-10-CM

## 2024-06-01 MED ORDER — GADOBUTROL 1 MMOL/ML IV SOLN
10.0000 mL | Freq: Once | INTRAVENOUS | Status: AC | PRN
  Administered 2024-06-01: 10 mL via INTRAVENOUS

## 2024-06-04 NOTE — Telephone Encounter (Signed)
 Attempted to call patient regarding her pelvic MRI. Patient did not answer. Left HIPAA compliant VM.   Pelvic MRI did show thickened endometrium and would recommend further gynecologic evaluation.  I have referred her to gynecology for this.   Additionally the pelvic MRI did not necessarily show a cause for the unilateral leg swelling. Thus, I have made a referral to vascular to see if they can help evaluate a cause of this.

## 2024-06-11 LAB — COLOGUARD: COLOGUARD: POSITIVE — AB

## 2024-06-11 NOTE — Addendum Note (Signed)
 Addended by: Nichollas Perusse on: 06/11/2024 11:07 AM   Modules accepted: Orders

## 2024-06-11 NOTE — Progress Notes (Signed)
 Positive cologuard, called patient and informed her. Also informed her that I would put in a referral for GI.

## 2024-06-13 ENCOUNTER — Ambulatory Visit: Admitting: Family Medicine

## 2024-06-13 VITALS — BP 138/92 | HR 85 | Ht 65.0 in | Wt 341.0 lb

## 2024-06-13 DIAGNOSIS — R6889 Other general symptoms and signs: Secondary | ICD-10-CM

## 2024-06-13 DIAGNOSIS — D508 Other iron deficiency anemias: Secondary | ICD-10-CM | POA: Diagnosis not present

## 2024-06-13 DIAGNOSIS — K9189 Other postprocedural complications and disorders of digestive system: Secondary | ICD-10-CM | POA: Diagnosis not present

## 2024-06-13 DIAGNOSIS — R9389 Abnormal findings on diagnostic imaging of other specified body structures: Secondary | ICD-10-CM

## 2024-06-13 NOTE — Progress Notes (Unsigned)
 Discussed the use of AI scribe software for clinical note transcription with the patient, who gave verbal consent to proceed.  History of Present Illness   Valerie Carter is a 63 year old female who presents with a positive Cologuard test and concerns about colonoscopy due to high BMI.  Colorectal cancer screening and gastrointestinal symptoms - Positive Cologuard test result.  Does have some hemorrhoids and occasional bleeding from that.   - Historically she has been unable to obtain colonoscopy due to high BMI; prior appointments canceled despite multiple scheduling attempts.  Unfortunately they were usually canceled at the last minute so she is very of scheduling again unless it is scheduled at the hospital because she was told that anesthesia would have to be administered at the hospital secondary to her BMI.  Gynecologic findings and screening - MRI pelvis revealed multiple uterine fibroids and stable thickened endometrial lining. - Not sexually active. Has avoided Pap smears because they are uncomfortable.  Willing to go to gynecologist to have Pap smear and endometrial biopsy if we can schedule her with somebody who can manage this procedure given her BMI.  Iron  deficiency anemia - History of iron  deficiency treated previously with intravenous iron . - Last hemoglobin reported around 12; she is being followed by hematology oncology and has a follow-up appointment with them on January 19.      PERTINENT  PMH / PSH: I have reviewed the patients medications, allergies, past medical and surgical history, smoking status.  Pertinent findings that relate to today's visit / issues include:   Physical Exam    GENERAL: Well-developed no acute distress PSYCH: AxOx4. Good eye contact.. No psychomotor retardation or agitation. Appropriate speech fluency and content. Asks and answers questions appropriately. Mood is congruent.  Gait: Antalgic, walking with a cane.  Rises with some  difficulty and stiffness from seated position with assistance from her cane.  TESTS & LABS: 06/01/2024 MRI pelvis: IMPRESSION: 1. Persistent thickened endometrium measuring 1.8 cm, previously 1.4 cm on 10/31/2023. Recommend gynecologic consultation and endometrial sampling for further evaluation. 2. Multifocal uterine masses, likely leiomyomas, the largest of which have all increased in size from 08/01/2015. 3. Slightly increased size of 11 mm left external iliac lymph node, nonspecific. 4. Partially imaged degenerative changes of the lumbar spine with edema within the anterior aspect of the L4-5 intervertebral disc.     Electronically Signed   By: Limin  Xu M.D.    Assessment and Plan    Abnormal colorectal cancer screening Positive Cologuard test suggests potential colon polyp, possibly cancerous.  -Colonoscopy needed for evaluation and potential polyp removal. - Advised by prior GI specialist to arrange colonoscopy in a hospital setting due to BMI. (For anesthesiology reasons) - Scheduled follow-up with me  in February to review workup results to make sure we do not have anything additional we need to do.  Uterine fibroids with endometrial thickening, cannot rule out neoplastic process MRI shows multiple fibroids and thickened endometrial lining. Endometrial biopsy recommended to rule out malignancy. We discussed. She is also due a pap. Has avoided GYN exam previously secondary to discomfort related to habitus  - Referred to specialist for Pap smear and consideration of endometrial biopsy  - I recommend sheschedule follow-up in February with me to discuss workup results.  Iron  deficiency anemia Previous hemoglobin level 12.0. Lab work scheduled to reassess iron  levels and determine need for further IV iron  therapy. - Ensure lab work is completed on January 16th to assess iron  levels.

## 2024-06-15 DIAGNOSIS — R6889 Other general symptoms and signs: Secondary | ICD-10-CM | POA: Insufficient documentation

## 2024-07-11 ENCOUNTER — Encounter: Payer: Self-pay | Admitting: Obstetrics & Gynecology

## 2024-07-11 ENCOUNTER — Ambulatory Visit: Payer: Self-pay | Admitting: Obstetrics & Gynecology

## 2024-07-11 VITALS — BP 161/105 | HR 97 | Wt 344.9 lb

## 2024-07-11 DIAGNOSIS — R9389 Abnormal findings on diagnostic imaging of other specified body structures: Secondary | ICD-10-CM

## 2024-07-11 DIAGNOSIS — D259 Leiomyoma of uterus, unspecified: Secondary | ICD-10-CM | POA: Diagnosis not present

## 2024-07-11 MED ORDER — MEDROXYPROGESTERONE ACETATE 5 MG PO TABS: 5.0000 mg | ORAL_TABLET | Freq: Every day | ORAL | 3 refills | Status: AC

## 2024-07-11 NOTE — Progress Notes (Signed)
 New patient is in the office to establish care and for endometrial biopsy. Pt states she did not take her BP medication today and she is nervous so BP is elevated.  Patient reports that she was referred here after recent MRI on 06/01/24.

## 2024-07-11 NOTE — Progress Notes (Signed)
 Patient ID: Valerie Carter, female   DOB: 16-Apr-1961, 64 y.o.   MRN: [id number]  Chief Complaint  Patient presents with   New Patient (Initial Visit)    HPI Valerie Carter is a 64 y.o. female.  [id number]  HPI  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   Diabetes mellitus without complication (HCC)    type 2   DJD (degenerative joint disease)    both knees right worse than left   Headache    Hyperlipidemia    Hypertension    Intramural leiomyoma of uterus    Joint pain    Knee pain    Lactose intolerance    Lower extremity edema    Morbid obesity with BMI of 50.0-59.9, adult (HCC)    Osteoarthritis    Vitamin D  deficiency     Past Surgical History:  Procedure Laterality Date   BREAST BIOPSY Left 01/04/2023   US  LT BREAST BX W LOC DEV 1ST LESION IMG BX SPEC US  GUIDE 01/04/2023 [facility]   KNEE SURGERY Right 1999   torn cartlidge repair   LAPAROSCOPIC GASTRIC SLEEVE RESECTION N/A 06/07/2017   Procedure: LAPAROSCOPIC GASTRIC SLEEVE RESECTION, UPPER ENDOSCOPY;  Surgeon: Stevie Herlene Righter, MD;  Location: [facility];  Service: General;  Laterality: N/A;    Family History  Problem Relation Age of Onset   Diabetes Sister    Hypertension Sister    Diabetes Maternal Aunt    Breast cancer Maternal Aunt 29   Hypertension Mother    Diabetes Father     Social History Social History[1]  Allergies[2]  Current Outpatient Medications  Medication Sig Dispense Refill   acetaminophen  (ACETAMINOPHEN  8 HOUR) 650 MG CR tablet Take 1,300 mg by mouth every 12 (twelve) hours.     cyclobenzaprine  (FLEXERIL ) 5 MG tablet Take 1 tablet (5 mg total) by mouth 3 (three) times daily. 30 tablet 0   Diclofenac  Sodium 3 % GEL Apply 1 Application topically 2 (two) times daily. Apply to painful areas 100 g 2   levETIRAcetam  (KEPPRA ) 250 MG tablet Take 1 tablet (250 mg total) by mouth 2 (two) times  daily. 180 tablet 3   lisinopril  (ZESTRIL ) 5 MG tablet Take 1 tablet (5 mg total) by mouth at bedtime. 90 tablet 3   Menthol, Topical Analgesic, (BIOFREEZE ROLL-ON) 4 % GEL Apply 1 application  topically daily.     metoprolol  tartrate (LOPRESSOR ) 50 MG tablet Take 1 tablet (50 mg total) by mouth 2 (two) times daily. 60 tablet 2   Multiple Vitamin (MULTIVITAMIN WITH MINERALS) TABS tablet Take 1 tablet by mouth daily.     senna (SENOKOT) 8.6 MG TABS tablet Take 1 tablet (8.6 mg total) by mouth 2 (two) times daily. (Patient taking differently: Take 1 tablet by mouth daily.) 60 tablet 1   thiamine  (VITAMIN B-1) 100 MG tablet Take 1 tablet (100 mg total) by mouth daily. 30 tablet 1   No current facility-administered medications for this visit.    Review of Systems Review of Systems  Blood pressure (!) 161/105, pulse 97, weight (!) 344 lb 14.4 oz (156.4 kg).  Physical Exam Physical Exam  Data Reviewed Narrative & Impression  CLINICAL DATA:  Thickened endometrium with new unilateral leg swelling   EXAM: MRI PELVIS WITHOUT AND WITH CONTRAST   TECHNIQUE: Multiplanar multisequence MR imaging of the pelvis was  performed both before and after administration of intravenous contrast.   CONTRAST:  10mL GADAVIST  GADOBUTROL  1 MMOL/ML IV SOLN   COMPARISON:  CT abdomen and pelvis dated 10/31/2023, ultrasound pelvis dated 08/01/2015   FINDINGS: Urinary Tract:  No abnormality visualized.   Bowel:  Unremarkable visualized pelvic bowel loops.   Vascular/Lymphatic: 11 mm left external iliac lymph node (4:22), slightly increased in size from 9 mm on 10/31/2023. No significant vascular abnormality seen.   Reproductive: Uterus measures 12.5 cm in sagittal dimension. Again seen is thickened endometrium measuring 1.8 cm. Normal-appearing ovaries. Multifocal uterine masses, with the largest measuring:   -5.1 x 4.9 x 4.8 cm (3:17, 5:16) subserosal right fundal, previously 4.7 x 4.4 x 4.0 cm on  08/01/2015   -4.2 x 3.8 x 3.3 cm (3:19, 5:20) intramural right uterine body, previously 3.6 x 3.1 x 3.0 cm   -3.0 x 3.0 x 2.0 cm (3:21, 5:19) intramural right anterior lower uterine segment, previously 3.0 x 2.4 x 2.1 cm   These masses demonstrate varying degrees of enhancement postcontrast.   Other:  None.   Musculoskeletal: No suspicious bone lesions identified. Partially imaged degenerative changes of the lumbar spine with edema within the anterior aspect of the L4-5 intervertebral disc.   IMPRESSION: 1. Persistent thickened endometrium measuring 1.8 cm, previously 1.4 cm on 10/31/2023. Recommend gynecologic consultation and endometrial sampling for further evaluation. 2. Multifocal uterine masses, likely leiomyomas, the largest of which have all increased in size from 08/01/2015. 3. Slightly increased size of 11 mm left external iliac lymph node, nonspecific. 4. Partially imaged degenerative changes of the lumbar spine with edema within the anterior aspect of the L4-5 intervertebral disc.     Electronically Signed   By: Limin  Xu M.D.   On: 06/01/2024 11:58      Assessment Thickened endometrium - Plan: US  PELVIC COMPLETE WITH TRANSVAGINAL, medroxyPROGESTERone  (PROVERA ) 5 MG tablet  Uterine leiomyoma, unspecified location As she has stable imaging over the years and no postmenopausal bleeding we will f/u US  after 5-6 months of Provera    Plan Meds ordered this encounter  Medications   medroxyPROGESTERone  (PROVERA ) 5 MG tablet    Sig: Take 1 tablet (5 mg total) by mouth daily.    Dispense:  90 tablet    Refill:  3   Orders Placed This Encounter  Procedures   US  PELVIC COMPLETE WITH TRANSVAGINAL    Standing Status:   Future    Expiration Date:   01/08/2025    Reason for Exam (SYMPTOM  OR DIAGNOSIS REQUIRED):   fibroids    Preferred imaging location?:   [facility]        Lynwood Solomons 07/11/2024, 2:10 PM       [1]  Social History Tobacco Use    Smoking status: Never    Passive exposure: Never   Smokeless tobacco: Never  Vaping Use   Vaping status: Never Used  Substance Use Topics   Alcohol use: Yes    Alcohol/week: 14.0 standard drinks of alcohol    Types: 14 Cans of beer per week   Drug use: No  [2] No Known Allergies

## 2024-07-13 ENCOUNTER — Inpatient Hospital Stay: Attending: Hematology and Oncology

## 2024-07-13 ENCOUNTER — Other Ambulatory Visit (HOSPITAL_COMMUNITY): Payer: Self-pay | Admitting: Hematology and Oncology

## 2024-07-13 ENCOUNTER — Encounter: Payer: Self-pay | Admitting: Hematology and Oncology

## 2024-07-13 ENCOUNTER — Inpatient Hospital Stay (HOSPITAL_BASED_OUTPATIENT_CLINIC_OR_DEPARTMENT_OTHER): Admitting: Hematology and Oncology

## 2024-07-13 VITALS — BP 127/78 | HR 73 | Temp 97.7°F | Resp 18 | Ht 65.0 in | Wt 338.0 lb

## 2024-07-13 DIAGNOSIS — K909 Intestinal malabsorption, unspecified: Secondary | ICD-10-CM | POA: Diagnosis not present

## 2024-07-13 DIAGNOSIS — Z9884 Bariatric surgery status: Secondary | ICD-10-CM

## 2024-07-13 DIAGNOSIS — D500 Iron deficiency anemia secondary to blood loss (chronic): Secondary | ICD-10-CM | POA: Diagnosis not present

## 2024-07-13 LAB — CBC WITH DIFFERENTIAL/PLATELET
Abs Immature Granulocytes: 0.02 K/uL (ref 0.00–0.07)
Basophils Absolute: 0 K/uL (ref 0.0–0.1)
Basophils Relative: 1 %
Eosinophils Absolute: 0.2 K/uL (ref 0.0–0.5)
Eosinophils Relative: 4 %
HCT: 37 % (ref 36.0–46.0)
Hemoglobin: 11.5 g/dL — ABNORMAL LOW (ref 12.0–15.0)
Immature Granulocytes: 0 %
Lymphocytes Relative: 33 %
Lymphs Abs: 1.8 K/uL (ref 0.7–4.0)
MCH: 23.7 pg — ABNORMAL LOW (ref 26.0–34.0)
MCHC: 31.1 g/dL (ref 30.0–36.0)
MCV: 76.1 fL — ABNORMAL LOW (ref 80.0–100.0)
Monocytes Absolute: 0.6 K/uL (ref 0.1–1.0)
Monocytes Relative: 11 %
Neutro Abs: 2.8 K/uL (ref 1.7–7.7)
Neutrophils Relative %: 51 %
Platelets: 211 K/uL (ref 150–400)
RBC: 4.86 MIL/uL (ref 3.87–5.11)
RDW: 14.6 % (ref 11.5–15.5)
WBC: 5.5 K/uL (ref 4.0–10.5)
nRBC: 0 % (ref 0.0–0.2)

## 2024-07-13 LAB — FERRITIN: Ferritin: 29 ng/mL (ref 11–307)

## 2024-07-13 NOTE — Assessment & Plan Note (Addendum)
 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 We discussed some of the risks, benefits, and alternatives of intravenous iron  infusions. The patient is symptomatic from anemia and the iron  level is critically low. She tolerated oral iron  supplement poorly and desires to achieved higher levels of iron  faster for adequate hematopoesis. Some of the side-effects to be expected including risks of infusion reactions, phlebitis, headaches, nausea and fatigue.  The patient is willing to proceed. Patient education material was dispensed.  Goal is to keep ferritin level greater than 50 and resolution of anemia I will order 2 more doses of intravenous iron  I will see her again in 6 months

## 2024-07-13 NOTE — Progress Notes (Signed)
 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 We discussed some of the risks, benefits, and alternatives of intravenous iron  infusions. The patient is symptomatic from anemia and the iron  level is critically low. She tolerated oral iron  supplement poorly and desires to achieved higher levels of iron  faster for adequate hematopoesis. Some of the side-effects to be expected including risks of infusion reactions, phlebitis, headaches, nausea and fatigue.  The patient is willing to proceed. Patient education material was dispensed.  Goal is to keep ferritin level greater than 50 and resolution of anemia I will order 2 more doses of intravenous iron  I will see her again in 6 months     Orders Placed This Encounter  Procedures   Vitamin B12    Standing Status:   Future    Expiration Date:   07/13/2025   Reticulocytes    Standing Status:   Future    Expiration Date:   07/13/2025   VITAMIN D  25 Hydroxy (Vit-D Deficiency, Fractures)    Standing Status:   Future    Expiration Date:   07/13/2025   Vitamin B1    Standing Status:   Future    Expiration Date:   07/13/2025   Copper , serum    Standing Status:   Future    Expiration Date:   07/13/2025    INTERVAL HISTORY: Patient returns for recurrent anemia Symptoms of anemia includes fatigue We reviewed CBC and iron  study  SUMMARY OF HEMATOLOGIC HISTORY:  See my detailed dictation from March 17, 2023 for further  details.  The patient was originally seen in the hospital as a consult due to severe anemia  She was found to have abnormal CBC from recent blood work The patient have history of laparoscopic gastric sleeve resection in 2018. The patient was advised to take chronic vitamin supplementation but she stopped taking them approximately 3 years ago.  She had regained a lot of weight and then was placed on [name]  for the last 2 years and she has lost 50 pounds. Most recently, she noted to have progressive symptoms of anemia with shortness of breath on minimal exertion, pre-syncopal episodes, and palpitations. She had not noticed any recent bleeding such as hematuria or hematochezia.  She had spontaneous epistaxis 2 weeks ago but that has resolved.  She denies abnormal postmenopausal bleeding She is not on antiplatelets agents.  She takes ibuprofen  on a regular basis for chronic joint pain and back pain. She has not have colonoscopy done.  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 She never donated blood or received blood transfusion Due to religious reason, she has declined transfusion.  Her blood count has dropped since admission to 5.6 with low MCV.  Iron  studies confirm severe iron  deficiency anemia.  Serum vitamin B12 and folate were normal.  She does not have chronic kidney disease.  Family is wondering whether she would qualify for erythropoietin  stimulating agents She received  4 doses of intravenous iron  in 2024 She received 3 doses of intravenous iron  in 2025  Lab Results  Component Value Date   VITAMINB12 1,654 (H) 03/17/2023   FERRITIN 29 07/13/2024   HGB 11.5 (L) 07/13/2024   RBC 4.86 07/13/2024   Vitals:   07/13/24 0858  BP: 127/78  Pulse: 73  Resp: 18  Temp: 97.7 F (36.5 C)  SpO2: 100%   "

## 2024-07-13 NOTE — Assessment & Plan Note (Addendum)
 I doubt she has any meaningful iron  absorption due to her gastric bypass surgery For now, I recommend the patient not to take oral iron  supplement  We will continue to follow and I will check additional labs in her next visit

## 2024-07-16 ENCOUNTER — Telehealth: Payer: Self-pay | Admitting: Pharmacy Technician

## 2024-07-16 NOTE — Telephone Encounter (Signed)
 Auth Submission: NO AUTH NEEDED Site of care: Site of care: [facility] Payer: [place] MEDICAID Medication & CPT/J Code(s) submitted: Feraheme  (ferumoxytol ) [id number] Diagnosis Code: D50.9 Route of submission (phone, fax, portal):  Phone # Fax # Auth type: Buy/Bill PB Units/visits requested: 2 DOSES Reference number:  Approval from: 07/16/24 to 10/25/24

## 2024-07-17 ENCOUNTER — Ambulatory Visit (HOSPITAL_COMMUNITY)
Admission: RE | Admit: 2024-07-17 | Discharge: 2024-07-17 | Disposition: A | Discharge status: Home / Self Care | Source: Ambulatory Visit | Attending: Obstetrics & Gynecology | Admitting: Obstetrics & Gynecology

## 2024-07-17 ENCOUNTER — Other Ambulatory Visit: Payer: Self-pay | Admitting: Family Medicine

## 2024-07-17 DIAGNOSIS — R9389 Abnormal findings on diagnostic imaging of other specified body structures: Secondary | ICD-10-CM | POA: Insufficient documentation

## 2024-07-17 MED ORDER — HYDROCODONE-ACETAMINOPHEN 5-325 MG PO TABS: 2.0000 | ORAL_TABLET | Freq: Two times a day (BID) | ORAL | 0 refills | Status: AC | PRN

## 2024-07-19 ENCOUNTER — Other Ambulatory Visit: Payer: Self-pay

## 2024-07-19 ENCOUNTER — Telehealth: Payer: Self-pay

## 2024-07-19 ENCOUNTER — Ambulatory Visit: Payer: Self-pay | Admitting: Obstetrics & Gynecology

## 2024-07-19 DIAGNOSIS — D259 Leiomyoma of uterus, unspecified: Secondary | ICD-10-CM

## 2024-07-19 NOTE — Progress Notes (Signed)
 Valerie Lynwood MATSU, MD  Burnard Niels PARAS, RMA  Patients US  was supposed to be in June. Please schedule pelvic US  in June and f/u with me in July  Per Provider PTV US  ordered for June.

## 2024-07-19 NOTE — Telephone Encounter (Signed)
 Patient calls nurse line in regards to GI referral.   She reports she has not heard from anyone to schedule.   Office information given for [facility].  Advised patient to call them to schedule her initial apt.   Patient was appreciative.

## 2024-07-20 ENCOUNTER — Ambulatory Visit

## 2024-07-20 VITALS — BP 149/86 | HR 72 | Temp 97.7°F | Resp 18 | Ht 65.0 in | Wt 347.0 lb

## 2024-07-20 DIAGNOSIS — D508 Other iron deficiency anemias: Secondary | ICD-10-CM

## 2024-07-20 MED ORDER — ACETAMINOPHEN 325 MG PO TABS
650.0000 mg | ORAL_TABLET | Freq: Once | ORAL | Status: AC
  Administered 2024-07-20: 650 mg via ORAL
  Filled 2024-07-20: qty 2

## 2024-07-20 MED ORDER — SODIUM CHLORIDE 0.9 % IV SOLN
510.0000 mg | Freq: Once | INTRAVENOUS | Status: AC
  Administered 2024-07-20: 510 mg via INTRAVENOUS
  Filled 2024-07-20: qty 17

## 2024-07-20 MED ORDER — DIPHENHYDRAMINE HCL 25 MG PO CAPS
25.0000 mg | ORAL_CAPSULE | Freq: Once | ORAL | Status: AC
  Administered 2024-07-20: 25 mg via ORAL
  Filled 2024-07-20: qty 1

## 2024-07-20 NOTE — Progress Notes (Signed)
 Diagnosis:  Iron  Deficiency Anemia  Provider:  Praveen Mannam MD  Procedure: IV Infusion  IV Type: Peripheral, IV Location: left wrist   Feraheme  (Ferumoxytol ), Dose: 510 mg  Infusion Start Time: 9147  Infusion Stop Time: 0908  Post Infusion IV Care: Patient declined observation and Peripheral IV Discontinued  Discharge: Condition: Good, Destination: Home . AVS Declined  Performed by:  Maximiano JONELLE Pouch, LPN

## 2024-07-23 ENCOUNTER — Ambulatory Visit: Admitting: Neurology

## 2024-07-27 ENCOUNTER — Ambulatory Visit

## 2024-08-03 ENCOUNTER — Encounter: Payer: Self-pay | Admitting: Gastroenterology

## 2024-08-03 ENCOUNTER — Ambulatory Visit

## 2024-08-03 ENCOUNTER — Ambulatory Visit: Admitting: Gastroenterology

## 2024-08-03 ENCOUNTER — Other Ambulatory Visit: Payer: Self-pay | Admitting: Gastroenterology

## 2024-08-03 VITALS — BP 146/92 | HR 72 | Temp 97.9°F | Resp 18 | Ht 65.0 in | Wt 346.2 lb

## 2024-08-03 VITALS — BP 136/84 | HR 88 | Ht 65.0 in | Wt 346.0 lb

## 2024-08-03 DIAGNOSIS — K5909 Other constipation: Secondary | ICD-10-CM

## 2024-08-03 DIAGNOSIS — R195 Other fecal abnormalities: Secondary | ICD-10-CM

## 2024-08-03 DIAGNOSIS — K9189 Other postprocedural complications and disorders of digestive system: Secondary | ICD-10-CM

## 2024-08-03 DIAGNOSIS — Z9889 Other specified postprocedural states: Secondary | ICD-10-CM

## 2024-08-03 MED ORDER — DIPHENHYDRAMINE HCL 25 MG PO CAPS: 25.0000 mg | ORAL_CAPSULE | Freq: Once | ORAL | Status: DC

## 2024-08-03 MED ORDER — ACETAMINOPHEN 325 MG PO TABS
650.0000 mg | ORAL_TABLET | Freq: Once | ORAL | Status: AC
  Administered 2024-08-03: 650 mg via ORAL
  Filled 2024-08-03: qty 2

## 2024-08-03 MED ORDER — SODIUM CHLORIDE 0.9 % IV SOLN
510.0000 mg | Freq: Once | INTRAVENOUS | Status: AC
  Administered 2024-08-03: 510 mg via INTRAVENOUS
  Filled 2024-08-03: qty 17

## 2024-08-03 MED ORDER — NA SULFATE-K SULFATE-MG SULF 17.5-3.13-1.6 GM/177ML PO SOLN: 1.0000 | Freq: Once | ORAL | 0 refills | Status: AC

## 2024-08-03 NOTE — Patient Instructions (Addendum)
 Chronic constipation Recommend high fiber diet  Drink plenty of fluids Continue miralax  po daily Continue senokot prn as needed  Iron  deficiency anemia Continue iv iron  infusions as needed Avoids NSAIDs (motrin , aleve ,advil ) can increase risk of ulcers Recommend GERD diet  Advised to go to the ER if there is any severe weakness, severe abdominal pain, vomit blood, dark red blood in your bowel movement, shortness of breath or chest pain.   We have sent the following medications to your pharmacy for you to pick up at your convenience: SUPREP  You have been scheduled for an endoscopy and colonoscopy. Please follow the written instructions given to you at your visit today.  If you use inhalers (even only as needed), please bring them with you on the day of your procedure.  DO NOT TAKE 7 DAYS PRIOR TO TEST- Trulicity (dulaglutide) Ozempic , Wegovy (semaglutide ) [name] , Zepbound  (tirzepatide ) Bydureon Bcise (exanatide extended release)  DO NOT TAKE 1 DAY PRIOR TO YOUR TEST Rybelsus (semaglutide ) Adlyxin (lixisenatide) Victoza (liraglutide) Byetta (exanatide) ___________________________________________________________________________ Due to recent changes in healthcare laws, you may see the results of your imaging and laboratory studies on MyChart before your provider has had a chance to review them.  We understand that in some cases there may be results that are confusing or concerning to you. Not all laboratory results come back in the same time frame and the provider may be waiting for multiple results in order to interpret others.  Please give us  48 hours in order for your provider to thoroughly review all the results before contacting the office for clarification of your results.   _______________________________________________________  If your blood pressure at your visit was 140/90 or greater, please contact your primary care physician to follow up on  this.  _______________________________________________________  If you are age 64 or older, your body mass index should be between 23-30. Your Body mass index is 57.58 kg/m. If this is out of the aforementioned range listed, please consider follow up with your Primary Care Provider.  If you are age 64 or younger, your body mass index should be between 19-25. Your Body mass index is 57.58 kg/m. If this is out of the aformentioned range listed, please consider follow up with your Primary Care Provider.   ________________________________________________________  The [facility] providers would like to encourage you to use MYCHART to communicate with providers for non-urgent requests or questions.  Due to long hold times on the telephone, sending your provider a message by [facility] may be a faster and more efficient way to get a response.  Please allow 48 business hours for a response.  Please remember that this is for non-urgent requests.  _______________________________________________________  [facility] is using a team-based approach to care.  Your team is made up of your doctor and two to three APPS. Our APPS (Nurse Practitioners and Physician Assistants) work with your physician to ensure care continuity for you. They are fully qualified to address your health concerns and develop a treatment plan. They communicate directly with your gastroenterologist to care for you. Seeing the Advanced Practice Practitioners on your physician's team can help you by facilitating care more promptly, often allowing for earlier appointments, access to diagnostic testing, procedures, and other specialty referrals.   Thank you for trusting me with your gastrointestinal care. Deanna May, FNP-C

## 2024-08-03 NOTE — Progress Notes (Signed)
 Diagnosis:  Iron  Deficiency Anemia  Provider:  Praveen Mannam MD  Procedure: IV Infusion  IV Type: Peripheral, IV Location: left wrist   Feraheme  (Ferumoxytol ), Dose: 510 mg  Infusion Start Time: 0852  Infusion Stop Time: 0910  Post Infusion IV Care: Observation period completed and Peripheral IV Discontinued  Discharge: Condition: Good, Destination: Home . AVS Declined  Performed by:  Maximiano JONELLE Pouch, LPN

## 2024-08-03 NOTE — Progress Notes (Signed)
 "  Chief Complaint: Positive Cologuard Primary GI Doctor: Dr. Suzann  HPI:  Patient is a  64  year old A.A. female patient with past medical history of iron  deficiency anemia, obesity, hypertension, hyperlipidemia, who was referred to me by Rosalynn Camie CROME, MD on 06/11/2024 for a evaluation of positive Cologuard.    07/13/2024 hematology/oncology follow-up for iron  deficiency anemia. The patient have history of laparoscopic gastric sleeve resection in 2018. The patient was advised to take chronic vitamin supplementation but she stopped taking them approximately 3 years ago. Most recently, she noted to have progressive symptoms of anemia with shortness of breath on minimal exertion, pre-syncopal episodes, and palpitations. She takes ibuprofen  on a regular basis for chronic joint pain and back pain.   Interval History Patient presents for evaluation of positive Cologuard and iron  deficiency anemia.  Positive cologuard Patient had never had colonoscopy   Iron  deficiency anemia  Patient has history of iron  deficiency anemia, reports diagnosed in the last year. She has been getting IV iron  every 3-6 mths with hematology.  Denies hematemesis. Denies blood in stool. Reports she has history of intermittent epistaxis. She consumes chicken, not a lot of red meat or pork. She had gastric sleeve in 2018.  Reports she has had some vaginal spotting, had transvaginal ultrasound and normal Does not do blood transfusions. Does not receive blood products. Reports her mother, sister, and aunt all have anemia, also they have history of ulcers.  Chronic constipation She has chronic constipation, which she takes OTC MiraLAX  or senokot on a regular basis.  She has daily BM.  She takes 4 tablets of motrin  and hydrocodone  on a regular basis for chronic joint pain and back pain.   Obesity Patient on [name] , she has lost over 30lbs.  Nonsmoker. She drinks beer occassionally.   No blood thinners.  History  of seizures, last one year ago.   Never had EGD/colonoscopy.  Patient's family history includes: no colon Ca, no esophageal CA  Wt Readings from Last 3 Encounters:  08/03/24 (!) 346 lb (156.9 kg)  08/03/24 (!) 346 lb 3.2 oz (157 kg)  07/20/24 (!) 347 lb (157.4 kg)    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   Diabetes mellitus without complication (HCC)    type 2   DJD (degenerative joint disease)    both knees right worse than left   Headache    Hyperlipidemia    Hypertension    Intramural leiomyoma of uterus    Joint pain    Knee pain    Lactose intolerance    Lower extremity edema    Morbid obesity with BMI of 50.0-59.9, adult (HCC)    Osteoarthritis    Vitamin D  deficiency     Past Surgical History:  Procedure Laterality Date   BREAST BIOPSY Left 01/04/2023   US  LT BREAST BX W LOC DEV 1ST LESION IMG BX SPEC US  GUIDE 01/04/2023 [facility]   KNEE SURGERY Right 1999   torn cartlidge repair   LAPAROSCOPIC GASTRIC SLEEVE RESECTION N/A 06/07/2017   Procedure: LAPAROSCOPIC GASTRIC SLEEVE RESECTION, UPPER ENDOSCOPY;  Surgeon: Stevie Herlene Righter, MD;  Location: [facility];  Service: General;  Laterality: N/A;    Current Outpatient Medications  Medication Sig Dispense Refill   acetaminophen  (ACETAMINOPHEN  8 HOUR) 650 MG CR tablet Take 1,300 mg by mouth every 12 (  twelve) hours.     Diclofenac  Sodium 3 % GEL Apply 1 Application topically 2 (two) times daily. Apply to painful areas 100 g 2   HYDROcodone -acetaminophen  (NORCO/VICODIN) 5-325 MG tablet Take 2 tablets by mouth every 12 (twelve) hours as needed for moderate pain (pain score 4-6). 120 tablet 0   levETIRAcetam  (KEPPRA ) 250 MG tablet Take 1 tablet (250 mg total) by mouth 2 (two) times daily. 180 tablet 3   lisinopril  (ZESTRIL ) 5 MG tablet Take 1 tablet (5 mg total) by mouth at bedtime. 90 tablet 3   medroxyPROGESTERone  (PROVERA )  5 MG tablet Take 1 tablet (5 mg total) by mouth daily. 90 tablet 3   Menthol, Topical Analgesic, (BIOFREEZE ROLL-ON) 4 % GEL Apply 1 application  topically daily.     metoprolol  tartrate (LOPRESSOR ) 50 MG tablet Take 1 tablet (50 mg total) by mouth 2 (two) times daily. 60 tablet 2   Multiple Vitamin (MULTIVITAMIN WITH MINERALS) TABS tablet Take 1 tablet by mouth daily.     senna (SENOKOT) 8.6 MG TABS tablet Take 1 tablet (8.6 mg total) by mouth 2 (two) times daily. (Patient taking differently: Take 1 tablet by mouth daily.) 60 tablet 1   thiamine  (VITAMIN B-1) 100 MG tablet Take 1 tablet (100 mg total) by mouth daily. 30 tablet 1   [name]  12.5 MG/0.5ML Pen Inject 12.5 mg into the skin once a week.     No current facility-administered medications for this visit.    Allergies as of 08/03/2024   (No Known Allergies)    Family History  Problem Relation Age of Onset   Diabetes Sister    Hypertension Sister    Diabetes Maternal Aunt    Breast cancer Maternal Aunt 29   Hypertension Mother    Diabetes Father     Review of Systems:    Constitutional: No weight loss, fever, chills, weakness or fatigue HEENT: Eyes: No change in vision               Ears, Nose, Throat:  No change in hearing or congestion Skin: No rash or itching Cardiovascular: No chest pain, chest pressure or palpitations   Respiratory: No SOB or cough Gastrointestinal: See HPI and otherwise negative Genitourinary: No dysuria or change in urinary frequency Neurological: No headache, dizziness or syncope Musculoskeletal: No new muscle or joint pain Hematologic: No bleeding or bruising Psychiatric: No history of depression or anxiety    Physical Exam:  Vital signs: BP 136/84   Pulse 88   Ht 5' 5 (1.651 m)   Wt (!) 346 lb (156.9 kg)   SpO2 98%   BMI 57.58 kg/m   Constitutional:   Pleasant A.A. female appears to be in NAD, Well developed, Well nourished, alert and cooperative Eyes:   PEERL, EOMI. No icterus.  Conjunctiva pink. Neck:  Supple Throat: Oral cavity and pharynx without inflammation, swelling or lesion.  Respiratory: Respirations even and unlabored. Lungs clear to auscultation bilaterally.   No wheezes, crackles, or rhonchi.  Cardiovascular: Normal S1, S2. Regular rate and rhythm. No peripheral edema, cyanosis or pallor.  Gastrointestinal:  Soft, nondistended, nontender. Obese. No rebound or guarding. Normal bowel sounds. No appreciable masses or hepatomegaly. Rectal:  Not performed.  Msk:  uses cane Neurologic:  Alert and  oriented x4;  grossly normal neurologically.  Skin:   Dry and intact without significant lesions or rashes.  RELEVANT LABS AND IMAGING: CBC    Latest Ref Rng & Units 07/13/2024    8:22 AM 05/11/2024  12:35 PM 12/09/2023   11:13 AM  CBC  WBC 4.0 - 10.5 K/uL 5.5  6.3  5.7   Hemoglobin 12.0 - 15.0 g/dL 88.4  87.7  88.5   Hematocrit 36.0 - 46.0 % 37.0  38.9  36.8   Platelets 150 - 400 K/uL 211  254  234      CMP     Latest Ref Rng & Units 05/28/2024    5:06 PM 11/04/2023    8:25 AM 11/03/2023    7:18 AM  CMP  Glucose 70 - 99 mg/dL 73  76  892   BUN 8 - 27 mg/dL 19  10  10    Creatinine 0.57 - 1.00 mg/dL 9.19  9.21  9.21   Sodium 134 - 144 mmol/L 142  138  134   Potassium 3.5 - 5.2 mmol/L 4.6  4.5  4.3   Chloride 96 - 106 mmol/L 106  108  105   CO2 20 - 29 mmol/L 20  16  19    Calcium  8.7 - 10.3 mg/dL 9.3  9.1  8.6      Lab Results  Component Value Date   TSH 1.100 05/28/2024   Lab Results  Component Value Date   IRON  12 (L) 03/17/2023   TIBC 382 03/17/2023   FERRITIN 29 07/13/2024  06/04/2024 Cologuard positive  10/2023 echo-Left ventricular ejection fraction, by estimation, is 55 to 60%.   Assessment: Encounter Diagnoses  Name Primary?   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 Hx of laparoscopic gastric sleeve resection in 2018. Also endorses using Motrin  on daily basis. Denies rectal bleeding. Denies hematemesis. Reports hx of epistaxis. Receives IV iron  infusions with hematology. Does not receive blood products. Will go ahead and schedule EGD/colon to evaluate for source of bleeding and for positive cologuard. Patient never had EGD/colon. Will schedule at hospital due to BMI. Patient will need 2 day prep due hx constipation and also on daily hydrocodone  and on mounjaro  which probably exacerbates the constipation.   1.  Positive Cologuard 2.  Iron  deficiency anemia 3. S/P laparoscopic sleeve gastrectomy  Stable Hgb 11.5, ferritin 29 - we discussed starting PPI therapy for potential gastritis, she would like to hold off for now -No NSAIDs -Scheduled EGD in hospital with Dr. Suzann (BMI >50). The risks and benefits of EGD with possible biopsies and esophageal dilation were discussed with the patient who agrees to proceed. -Schedule for a colonoscopy in hospital with Dr. Suzann. The risks and benefits of colonoscopy with possible polypectomy / biopsies were discussed and the patient agrees to proceed.  -hold morunjaro 1 week prior to procedures 4. Chronic constipation , on pain medication -continue miralax  po daily  -continue senokot prn -recommend high fiber diet 5. Obesity -on mounjaro    Thank you for the courtesy of this consult. Please call me with any questions or concerns.   Cassi Jenne, FNP-C [facility] 08/03/2024, 2:58 PM  Cc: Rosalynn Camie CROME, MD  "

## 2024-08-21 ENCOUNTER — Encounter (HOSPITAL_COMMUNITY): Payer: Self-pay

## 2024-08-21 ENCOUNTER — Ambulatory Visit (HOSPITAL_COMMUNITY): Admit: 2024-08-21 | Admitting: Pediatrics

## 2025-01-11 ENCOUNTER — Inpatient Hospital Stay: Admitting: Hematology and Oncology

## 2025-01-11 ENCOUNTER — Inpatient Hospital Stay
